# Patient Record
Sex: Male | Born: 1935 | Race: White | Hispanic: No | Marital: Married | Smoking: Former smoker
Health system: Southern US, Community
[De-identification: ages and names within clinical notes are randomized; demographics above are authoritative.]

## PROBLEM LIST (undated history)

## (undated) DIAGNOSIS — R7301 Impaired fasting glucose: Secondary | ICD-10-CM

## (undated) DIAGNOSIS — M1990 Unspecified osteoarthritis, unspecified site: Secondary | ICD-10-CM

## (undated) DIAGNOSIS — I4891 Unspecified atrial fibrillation: Secondary | ICD-10-CM

## (undated) DIAGNOSIS — R0602 Shortness of breath: Secondary | ICD-10-CM

## (undated) DIAGNOSIS — N200 Calculus of kidney: Secondary | ICD-10-CM

## (undated) DIAGNOSIS — I509 Heart failure, unspecified: Secondary | ICD-10-CM

## (undated) DIAGNOSIS — E785 Hyperlipidemia, unspecified: Secondary | ICD-10-CM

## (undated) DIAGNOSIS — K219 Gastro-esophageal reflux disease without esophagitis: Secondary | ICD-10-CM

## (undated) DIAGNOSIS — I219 Acute myocardial infarction, unspecified: Secondary | ICD-10-CM

## (undated) DIAGNOSIS — N400 Enlarged prostate without lower urinary tract symptoms: Secondary | ICD-10-CM

## (undated) DIAGNOSIS — J449 Chronic obstructive pulmonary disease, unspecified: Secondary | ICD-10-CM

## (undated) DIAGNOSIS — E039 Hypothyroidism, unspecified: Secondary | ICD-10-CM

## (undated) DIAGNOSIS — I10 Essential (primary) hypertension: Secondary | ICD-10-CM

## (undated) DIAGNOSIS — I2510 Atherosclerotic heart disease of native coronary artery without angina pectoris: Secondary | ICD-10-CM

## (undated) HISTORY — DX: Atherosclerotic heart disease of native coronary artery without angina pectoris: I25.10

## (undated) HISTORY — DX: Benign prostatic hyperplasia without lower urinary tract symptoms: N40.0

## (undated) HISTORY — DX: Unspecified atrial fibrillation: I48.91

## (undated) HISTORY — DX: Hyperlipidemia, unspecified: E78.5

## (undated) HISTORY — DX: Calculus of kidney: N20.0

## (undated) HISTORY — DX: Chronic obstructive pulmonary disease, unspecified: J44.9

## (undated) HISTORY — DX: Unspecified osteoarthritis, unspecified site: M19.90

## (undated) HISTORY — DX: Hypothyroidism, unspecified: E03.9

## (undated) HISTORY — DX: Impaired fasting glucose: R73.01

---

## 1978-12-23 HISTORY — PX: PLEURAL SCARIFICATION: SHX748

## 1989-08-23 HISTORY — PX: BACK SURGERY: SHX140

## 2000-12-23 HISTORY — PX: TRANSURETHRAL RESECTION OF PROSTATE: SHX73

## 2001-03-31 ENCOUNTER — Encounter: Payer: Self-pay | Admitting: Urology

## 2001-04-02 ENCOUNTER — Encounter (INDEPENDENT_AMBULATORY_CARE_PROVIDER_SITE_OTHER): Payer: Self-pay | Admitting: Specialist

## 2001-04-02 ENCOUNTER — Inpatient Hospital Stay (HOSPITAL_COMMUNITY): Admission: RE | Admit: 2001-04-02 | Discharge: 2001-04-04 | Payer: Self-pay | Admitting: Urology

## 2001-04-03 ENCOUNTER — Encounter: Payer: Self-pay | Admitting: Urology

## 2001-04-05 ENCOUNTER — Emergency Department (HOSPITAL_COMMUNITY): Admission: EM | Admit: 2001-04-05 | Discharge: 2001-04-05 | Payer: Self-pay | Admitting: Emergency Medicine

## 2001-08-12 ENCOUNTER — Encounter: Payer: Self-pay | Admitting: Internal Medicine

## 2001-08-12 ENCOUNTER — Ambulatory Visit (HOSPITAL_COMMUNITY): Admission: RE | Admit: 2001-08-12 | Discharge: 2001-08-12 | Payer: Self-pay | Admitting: Internal Medicine

## 2002-03-15 ENCOUNTER — Encounter: Payer: Self-pay | Admitting: Emergency Medicine

## 2002-03-15 ENCOUNTER — Inpatient Hospital Stay (HOSPITAL_COMMUNITY): Admission: EM | Admit: 2002-03-15 | Discharge: 2002-03-17 | Payer: Self-pay | Admitting: Emergency Medicine

## 2002-03-16 ENCOUNTER — Encounter: Payer: Self-pay | Admitting: Cardiology

## 2002-04-06 ENCOUNTER — Encounter (HOSPITAL_COMMUNITY): Admission: RE | Admit: 2002-04-06 | Discharge: 2002-07-05 | Payer: Self-pay | Admitting: Cardiology

## 2002-04-22 HISTORY — PX: ESOPHAGOGASTRODUODENOSCOPY: SHX1529

## 2002-04-23 ENCOUNTER — Ambulatory Visit (HOSPITAL_COMMUNITY): Admission: RE | Admit: 2002-04-23 | Discharge: 2002-04-23 | Payer: Self-pay | Admitting: Gastroenterology

## 2004-04-13 ENCOUNTER — Encounter: Payer: Self-pay | Admitting: Internal Medicine

## 2004-09-22 HISTORY — PX: INGUINAL HERNIA REPAIR: SHX194

## 2004-10-02 ENCOUNTER — Ambulatory Visit (HOSPITAL_COMMUNITY): Admission: RE | Admit: 2004-10-02 | Discharge: 2004-10-02 | Payer: Self-pay | Admitting: Surgery

## 2005-03-28 ENCOUNTER — Ambulatory Visit: Payer: Self-pay | Admitting: Internal Medicine

## 2005-07-01 ENCOUNTER — Ambulatory Visit: Payer: Self-pay | Admitting: Internal Medicine

## 2005-08-12 ENCOUNTER — Ambulatory Visit: Payer: Self-pay | Admitting: Internal Medicine

## 2005-10-09 ENCOUNTER — Ambulatory Visit: Payer: Self-pay | Admitting: Internal Medicine

## 2005-11-27 ENCOUNTER — Ambulatory Visit: Payer: Self-pay | Admitting: Internal Medicine

## 2006-05-29 ENCOUNTER — Ambulatory Visit: Payer: Self-pay | Admitting: Internal Medicine

## 2006-06-22 HISTORY — PX: BLADDER STONE REMOVAL: SHX568

## 2006-07-02 ENCOUNTER — Encounter: Admission: RE | Admit: 2006-07-02 | Discharge: 2006-07-02 | Payer: Self-pay | Admitting: Urology

## 2006-07-03 ENCOUNTER — Ambulatory Visit (HOSPITAL_BASED_OUTPATIENT_CLINIC_OR_DEPARTMENT_OTHER): Admission: RE | Admit: 2006-07-03 | Discharge: 2006-07-03 | Payer: Self-pay | Admitting: Urology

## 2006-09-30 ENCOUNTER — Ambulatory Visit: Payer: Self-pay | Admitting: Internal Medicine

## 2006-12-01 ENCOUNTER — Ambulatory Visit: Payer: Self-pay | Admitting: Internal Medicine

## 2007-06-01 DIAGNOSIS — N200 Calculus of kidney: Secondary | ICD-10-CM | POA: Insufficient documentation

## 2007-06-01 DIAGNOSIS — E785 Hyperlipidemia, unspecified: Secondary | ICD-10-CM

## 2007-06-01 DIAGNOSIS — M159 Polyosteoarthritis, unspecified: Secondary | ICD-10-CM | POA: Insufficient documentation

## 2007-06-01 DIAGNOSIS — J309 Allergic rhinitis, unspecified: Secondary | ICD-10-CM | POA: Insufficient documentation

## 2007-06-01 DIAGNOSIS — I2510 Atherosclerotic heart disease of native coronary artery without angina pectoris: Secondary | ICD-10-CM

## 2007-06-04 ENCOUNTER — Ambulatory Visit: Payer: Self-pay | Admitting: Internal Medicine

## 2007-06-04 DIAGNOSIS — G589 Mononeuropathy, unspecified: Secondary | ICD-10-CM | POA: Insufficient documentation

## 2007-06-09 LAB — CONVERTED CEMR LAB
Albumin: 3.8 g/dL (ref 3.5–5.2)
Bilirubin, Direct: 0.1 mg/dL (ref 0.0–0.3)
CO2: 28 meq/L (ref 19–32)
GFR calc Af Amer: 105 mL/min
Neutrophils Relative %: 69.8 % (ref 43.0–77.0)
Phosphorus: 2.8 mg/dL (ref 2.3–4.6)
Platelets: 189 10*3/uL (ref 150–400)
Potassium: 4.5 meq/L (ref 3.5–5.1)
RBC: 4.76 M/uL (ref 4.22–5.81)
RDW: 12.9 % (ref 11.5–14.6)
Sodium: 138 meq/L (ref 135–145)
Total Bilirubin: 0.8 mg/dL (ref 0.3–1.2)
WBC: 5.4 10*3/uL (ref 4.5–10.5)

## 2007-09-29 ENCOUNTER — Ambulatory Visit: Payer: Self-pay | Admitting: Internal Medicine

## 2007-10-21 ENCOUNTER — Encounter: Payer: Self-pay | Admitting: Internal Medicine

## 2007-10-26 ENCOUNTER — Telehealth (INDEPENDENT_AMBULATORY_CARE_PROVIDER_SITE_OTHER): Payer: Self-pay | Admitting: *Deleted

## 2007-12-02 ENCOUNTER — Ambulatory Visit: Payer: Self-pay | Admitting: Internal Medicine

## 2007-12-02 LAB — CONVERTED CEMR LAB
Albumin: 3.8 g/dL (ref 3.5–5.2)
Calcium: 10 mg/dL (ref 8.4–10.5)
Eosinophils Absolute: 0.2 10*3/uL (ref 0.0–0.6)
Eosinophils Relative: 2.5 % (ref 0.0–5.0)
Glucose, Bld: 100 mg/dL — ABNORMAL HIGH (ref 70–99)
Hemoglobin: 15.6 g/dL (ref 13.0–17.0)
Lymphocytes Relative: 13.2 % (ref 12.0–46.0)
MCHC: 34.9 g/dL (ref 30.0–36.0)
MCV: 94 fL (ref 78.0–100.0)
Monocytes Absolute: 0.7 10*3/uL (ref 0.2–0.7)
Monocytes Relative: 10.8 % (ref 3.0–11.0)
Potassium: 4.8 meq/L (ref 3.5–5.1)
RDW: 12.7 % (ref 11.5–14.6)
Sodium: 139 meq/L (ref 135–145)
TSH: 3.14 microintl units/mL (ref 0.35–5.50)
WBC: 6.9 10*3/uL (ref 4.5–10.5)

## 2008-01-04 ENCOUNTER — Telehealth (INDEPENDENT_AMBULATORY_CARE_PROVIDER_SITE_OTHER): Payer: Self-pay | Admitting: *Deleted

## 2008-03-21 ENCOUNTER — Telehealth (INDEPENDENT_AMBULATORY_CARE_PROVIDER_SITE_OTHER): Payer: Self-pay | Admitting: *Deleted

## 2008-04-12 ENCOUNTER — Encounter: Payer: Self-pay | Admitting: Internal Medicine

## 2008-04-20 ENCOUNTER — Ambulatory Visit: Payer: Self-pay | Admitting: Internal Medicine

## 2008-04-21 LAB — CONVERTED CEMR LAB
ALT: 16 units/L (ref 0–53)
AST: 24 units/L (ref 0–37)
Albumin: 3.8 g/dL (ref 3.5–5.2)
Alkaline Phosphatase: 68 units/L (ref 39–117)
BUN: 14 mg/dL (ref 6–23)
Basophils Absolute: 0 10*3/uL (ref 0.0–0.1)
Basophils Relative: 0.1 % (ref 0.0–1.0)
CO2: 27 meq/L (ref 19–32)
Calcium: 9.6 mg/dL (ref 8.4–10.5)
Chloride: 108 meq/L (ref 96–112)
Eosinophils Absolute: 0.2 10*3/uL (ref 0.0–0.7)
Eosinophils Relative: 3.2 % (ref 0.0–5.0)
GFR calc Af Amer: 93 mL/min
GFR calc non Af Amer: 77 mL/min
MCV: 93.9 fL (ref 78.0–100.0)
Monocytes Relative: 8.8 % (ref 3.0–12.0)
Neutro Abs: 3.9 10*3/uL (ref 1.4–7.7)
Phosphorus: 2.5 mg/dL (ref 2.3–4.6)
Platelets: 166 10*3/uL (ref 150–400)
TSH: 2.99 microintl units/mL (ref 0.35–5.50)
Total CHOL/HDL Ratio: 4.5
Triglycerides: 99 mg/dL (ref 0–149)

## 2008-05-02 ENCOUNTER — Encounter: Admission: RE | Admit: 2008-05-02 | Discharge: 2008-05-02 | Payer: Self-pay | Admitting: Cardiology

## 2008-05-05 ENCOUNTER — Inpatient Hospital Stay (HOSPITAL_BASED_OUTPATIENT_CLINIC_OR_DEPARTMENT_OTHER): Admission: RE | Admit: 2008-05-05 | Discharge: 2008-05-05 | Payer: Self-pay | Admitting: Cardiology

## 2008-05-09 ENCOUNTER — Telehealth (INDEPENDENT_AMBULATORY_CARE_PROVIDER_SITE_OTHER): Payer: Self-pay | Admitting: *Deleted

## 2008-05-10 ENCOUNTER — Encounter: Payer: Self-pay | Admitting: Internal Medicine

## 2008-07-27 ENCOUNTER — Telehealth (INDEPENDENT_AMBULATORY_CARE_PROVIDER_SITE_OTHER): Payer: Self-pay | Admitting: *Deleted

## 2008-08-23 ENCOUNTER — Ambulatory Visit: Payer: Self-pay | Admitting: Internal Medicine

## 2008-09-29 ENCOUNTER — Encounter: Payer: Self-pay | Admitting: Internal Medicine

## 2008-10-19 ENCOUNTER — Encounter: Payer: Self-pay | Admitting: Internal Medicine

## 2008-11-15 ENCOUNTER — Ambulatory Visit: Payer: Self-pay | Admitting: Family Medicine

## 2008-11-16 ENCOUNTER — Inpatient Hospital Stay (HOSPITAL_COMMUNITY): Admission: EM | Admit: 2008-11-16 | Discharge: 2008-11-18 | Payer: Self-pay | Admitting: Emergency Medicine

## 2008-11-16 ENCOUNTER — Ambulatory Visit: Payer: Self-pay | Admitting: Internal Medicine

## 2008-11-18 ENCOUNTER — Encounter: Payer: Self-pay | Admitting: Internal Medicine

## 2008-11-21 ENCOUNTER — Telehealth (INDEPENDENT_AMBULATORY_CARE_PROVIDER_SITE_OTHER): Payer: Self-pay | Admitting: *Deleted

## 2008-11-23 ENCOUNTER — Encounter: Payer: Self-pay | Admitting: Internal Medicine

## 2008-11-24 ENCOUNTER — Ambulatory Visit: Payer: Self-pay | Admitting: Internal Medicine

## 2008-11-24 DIAGNOSIS — J449 Chronic obstructive pulmonary disease, unspecified: Secondary | ICD-10-CM

## 2008-11-24 DIAGNOSIS — J4489 Other specified chronic obstructive pulmonary disease: Secondary | ICD-10-CM | POA: Insufficient documentation

## 2008-11-24 DIAGNOSIS — I4891 Unspecified atrial fibrillation: Secondary | ICD-10-CM | POA: Insufficient documentation

## 2008-12-22 ENCOUNTER — Encounter: Payer: Self-pay | Admitting: Internal Medicine

## 2009-01-04 ENCOUNTER — Ambulatory Visit: Payer: Self-pay | Admitting: Internal Medicine

## 2009-01-05 LAB — CONVERTED CEMR LAB
Eosinophils Relative: 5.2 % — ABNORMAL HIGH (ref 0.0–5.0)
GFR calc Af Amer: 105 mL/min
HCT: 45.2 % (ref 39.0–52.0)
Lymphocytes Relative: 17.1 % (ref 12.0–46.0)
MCV: 93.6 fL (ref 78.0–100.0)
Monocytes Absolute: 0.6 10*3/uL (ref 0.1–1.0)
Neutrophils Relative %: 67.6 % (ref 43.0–77.0)
RDW: 13 % (ref 11.5–14.6)
WBC: 5.7 10*3/uL (ref 4.5–10.5)

## 2009-01-18 ENCOUNTER — Encounter: Payer: Self-pay | Admitting: Internal Medicine

## 2009-02-08 ENCOUNTER — Encounter: Payer: Self-pay | Admitting: Internal Medicine

## 2009-02-22 ENCOUNTER — Encounter: Payer: Self-pay | Admitting: Internal Medicine

## 2009-03-13 ENCOUNTER — Telehealth: Payer: Self-pay | Admitting: Internal Medicine

## 2009-05-01 ENCOUNTER — Telehealth: Payer: Self-pay | Admitting: Internal Medicine

## 2009-05-31 ENCOUNTER — Encounter: Payer: Self-pay | Admitting: Internal Medicine

## 2009-07-12 ENCOUNTER — Telehealth: Payer: Self-pay | Admitting: Internal Medicine

## 2009-07-19 ENCOUNTER — Ambulatory Visit: Payer: Self-pay | Admitting: Internal Medicine

## 2009-07-20 LAB — CONVERTED CEMR LAB
ALT: 17 units/L (ref 0–53)
Alkaline Phosphatase: 82 units/L (ref 39–117)
Basophils Absolute: 0.1 10*3/uL (ref 0.0–0.1)
Bilirubin, Direct: 0.1 mg/dL (ref 0.0–0.3)
Calcium: 9.6 mg/dL (ref 8.4–10.5)
Chloride: 110 meq/L (ref 96–112)
Eosinophils Relative: 3.4 % (ref 0.0–5.0)
Glucose, Bld: 93 mg/dL (ref 70–99)
HCT: 44.5 % (ref 39.0–52.0)
Lymphocytes Relative: 16.3 % (ref 12.0–46.0)
Lymphs Abs: 0.9 10*3/uL (ref 0.7–4.0)
Monocytes Absolute: 0.5 10*3/uL (ref 0.1–1.0)
Monocytes Relative: 8.4 % (ref 3.0–12.0)
Neutro Abs: 3.9 10*3/uL (ref 1.4–7.7)
Neutrophils Relative %: 71 % (ref 43.0–77.0)
Phosphorus: 2.8 mg/dL (ref 2.3–4.6)
Total Bilirubin: 0.8 mg/dL (ref 0.3–1.2)

## 2009-09-12 ENCOUNTER — Telehealth: Payer: Self-pay | Admitting: Internal Medicine

## 2009-09-28 ENCOUNTER — Encounter: Payer: Self-pay | Admitting: Internal Medicine

## 2009-11-20 ENCOUNTER — Telehealth: Payer: Self-pay | Admitting: Internal Medicine

## 2010-01-23 ENCOUNTER — Ambulatory Visit: Payer: Self-pay | Admitting: Internal Medicine

## 2010-01-23 DIAGNOSIS — N400 Enlarged prostate without lower urinary tract symptoms: Secondary | ICD-10-CM | POA: Insufficient documentation

## 2010-02-15 ENCOUNTER — Telehealth: Payer: Self-pay | Admitting: Internal Medicine

## 2010-04-30 ENCOUNTER — Telehealth: Payer: Self-pay | Admitting: Internal Medicine

## 2010-06-29 ENCOUNTER — Ambulatory Visit: Payer: Self-pay | Admitting: Internal Medicine

## 2010-06-29 DIAGNOSIS — M79609 Pain in unspecified limb: Secondary | ICD-10-CM

## 2010-07-02 LAB — CONVERTED CEMR LAB
ALT: 15 units/L (ref 0–53)
Alkaline Phosphatase: 70 units/L (ref 39–117)
Bilirubin, Direct: 0.2 mg/dL (ref 0.0–0.3)
CO2: 30 meq/L (ref 19–32)
Creatinine, Ser: 1 mg/dL (ref 0.4–1.5)
Eosinophils Absolute: 0.2 10*3/uL (ref 0.0–0.7)
Eosinophils Relative: 2.3 % (ref 0.0–5.0)
GFR calc non Af Amer: 73 mL/min (ref >60)
Glucose, Bld: 100 mg/dL — ABNORMAL HIGH (ref 70–99)
HCT: 44.4 % (ref 39.0–52.0)
MCHC: 33.3 g/dL (ref 30.0–36.0)
MCV: 95.9 fL (ref 78.0–100.0)
Monocytes Absolute: 0.5 10*3/uL (ref 0.1–1.0)
Monocytes Relative: 7.9 % (ref 3.0–12.0)
Neutro Abs: 5.1 10*3/uL (ref 1.4–7.7)
Neutrophils Relative %: 74.5 % (ref 43.0–77.0)
Sodium: 142 meq/L (ref 135–145)
TSH: 6.37 microintl units/mL — ABNORMAL HIGH (ref 0.35–5.50)
Total Protein: 7.1 g/dL (ref 6.0–8.3)
WBC: 6.8 10*3/uL (ref 4.5–10.5)

## 2010-07-03 ENCOUNTER — Ambulatory Visit: Payer: Self-pay

## 2010-07-03 ENCOUNTER — Encounter: Payer: Self-pay | Admitting: Cardiovascular Disease

## 2010-07-13 ENCOUNTER — Encounter: Payer: Self-pay | Admitting: Internal Medicine

## 2010-07-25 ENCOUNTER — Encounter: Payer: Self-pay | Admitting: Internal Medicine

## 2010-08-14 ENCOUNTER — Ambulatory Visit: Payer: Self-pay | Admitting: Internal Medicine

## 2010-08-23 ENCOUNTER — Encounter: Admission: RE | Admit: 2010-08-23 | Discharge: 2010-08-23 | Payer: Self-pay | Admitting: Neurosurgery

## 2010-09-04 ENCOUNTER — Telehealth: Payer: Self-pay | Admitting: Internal Medicine

## 2010-12-05 ENCOUNTER — Telehealth: Payer: Self-pay | Admitting: Internal Medicine

## 2011-01-04 ENCOUNTER — Ambulatory Visit
Admission: RE | Admit: 2011-01-04 | Discharge: 2011-01-04 | Payer: Self-pay | Source: Home / Self Care | Attending: Internal Medicine | Admitting: Internal Medicine

## 2011-01-04 ENCOUNTER — Other Ambulatory Visit: Payer: Self-pay | Admitting: Internal Medicine

## 2011-01-04 DIAGNOSIS — E039 Hypothyroidism, unspecified: Secondary | ICD-10-CM | POA: Insufficient documentation

## 2011-01-04 LAB — TSH: TSH: 16.09 u[IU]/mL — ABNORMAL HIGH (ref 0.35–5.50)

## 2011-01-04 LAB — T4, FREE: Free T4: 0.74 ng/dL (ref 0.60–1.60)

## 2011-01-22 NOTE — Letter (Signed)
Summary: Cardiology-Dr. Viann Fish  Cardiology-Dr. Viann Fish   Imported By: Maryln Gottron 07/20/2010 11:19:43  _____________________________________________________________________  External Attachment:    Type:   Image     Comment:   External Document  Appended Document: Cardiology-Dr. Viann Fish stable  no changes 1 year follow up

## 2011-01-22 NOTE — Assessment & Plan Note (Signed)
Summary: 8 M F/U DLO   Vital Signs:  Patient profile:   77 year old male Height:      65 inches Weight:      127 pounds BMI:     21.21 Temp:     97.9 degrees F oral Pulse rate:   80 / minute Pulse rhythm:   regular BP sitting:   122 / 80  (left arm) Cuff size:   regular  Vitals Entered By: Linde Gillis CMA Duncan Dull) (June 29, 2010 7:51 AM) CC: 6 month follow up   History of Present Illness: Having awful left leg and hip pain Okay first thing in AM--progressive worsening as day goes on Okay at rest--bad with any activity Pain goes all the way down to foot and may be worst below knee Hydrocodone helps but doesn't take away pain completely pain relief doesn't really help his activity tolerance  No heart pain no recent angina or need for NTG no palpitations No SOB  No trouble voiding rare nocturia before 5AM feels the amitryiptylline really helps night symptoms  Allergies: 1)  * Vioxx (Rofecoxib) 2)  Penicillin G Potassium (Penicillin G Potassium) 3)  Lipitor (Atorvastatin Calcium) 4)  Vytorin (Ezetimibe-Simvastatin) 5)  * Tricor  Past History:  Past medical, surgical, family and social histories (including risk factors) reviewed for relevance to current acute and chronic problems.  Past Medical History: Reviewed history from 11/24/2008 and no changes required. Allergic rhinitis Coronary artery disease------------------------Dr Donnie Aho [phone] Hyperlipidemia Kidney stones BPH------------------------------------------------Dr Annabell Howells   [phone] Osteoarthritis Atrial fibrillation COPD  Past Surgical History: Reviewed history from 11/24/2008 and no changes required. Inguinal herniorrhaphy, left ([facility])  10/05 MI 03/03 Back surgery x 3 1990's TURP 2002 Pneumothorax right 1980's EGD/Colon negative Laural Benes) 05/03 Stress test 05/03 Cardiolite stress, negative EF 72% 04/05 Ureteroscopy/stone removal Annabell Howells) 07/07 Cardiolite negative 04/08 Arial fibrillation  with COPD exac--11/09  Family History: Reviewed history from 06/01/2007 and no changes required. Dad died @89  of old age Mom died @74  MI 3 brothers died of MI/CVA 3 other brothers living 1 sister died/2 living CAD throughout sibs No HTN or DM No cancer  Social History: Reviewed history from 06/04/2007 and no changes required. Retired--[employer] Married--1 child Former Smoker--quit with MI 3/03 Alcohol use-no Does lots of yard work and a big garden  Review of Systems       appetite is fine weight is down 7#---he thinks this is normal for him in the summer sleeps well--awakens refreshed  Physical Exam  General:  alert and normal appearance.   Neck:  supple, no masses, no thyromegaly, no carotid bruits, and no cervical lymphadenopathy.   Lungs:  normal respiratory effort and normal breath sounds.   Heart:  normal rate, regular rhythm, no murmur, and no gallop.   Abdomen:  soft, non-tender, and no masses.   Msk:  no joint tenderness and no joint swelling.   Only mild decreased internal rotation of left hip Left knee okay Pulses:  faint PT in both feet but no DP pulses Extremities:  no edema Skin:  no suspicious lesions and no ulcerations.   Psych:  normally interactive, good eye contact, not anxious appearing, and not depressed appearing.     Impression & Recommendations:  Problem # 1:  LEG PAIN, LEFT (ICD-729.5) Assessment New  highly suspicious for claudication less likely spinal stenosis (as unilateral) or arthritis in hip/knee will start with ABI and make vascular referral if abnormal ortho if not vascular cause but suspect no surgery appropriate if  spinal stenosis  Orders: LE Arterial Doppler/ABI (Le arterial doppler)  Problem # 2:  OSTEOARTHRITIS (ICD-715.90) Assessment: Unchanged generally does well with the pain meds  His updated medication list for this problem includes:    Adult Aspirin Low Strength 81 Mg Tbdp (Aspirin) .Marland Kitchen... Take one by mouth  daily    Vicodin 5-500 Mg Tabs (Hydrocodone-acetaminophen) .Marland Kitchen... 1 three times a day as needed for pain  Problem # 3:  ATRIAL FIBRILLATION (ICD-427.31) Assessment: Unchanged  still in sinus by exam no changes needed  His updated medication list for this problem includes:    Toprol Xl 50 Mg Tb24 (Metoprolol succinate) .Marland Kitchen... Take 1/2 tab by mouth daily    Adult Aspirin Low Strength 81 Mg Tbdp (Aspirin) .Marland Kitchen... Take one by mouth daily  Orders: TLB-Renal Function Panel (80069-RENAL) TLB-CBC Platelet - w/Differential (85025-CBCD) TLB-Hepatic/Liver Function Pnl (80076-HEPATIC) TLB-TSH (Thyroid Stimulating Hormone) (84443-TSH) Venipuncture (24401)  Problem # 4:  CORONARY ARTERY DISEASE (ICD-414.00) Assessment: Unchanged no angina doing fine on regimen  His updated medication list for this problem includes:    Altace 5 Mg Caps (Ramipril) .Marland Kitchen... Take one by mouth daily    Toprol Xl 50 Mg Tb24 (Metoprolol succinate) .Marland Kitchen... Take 1/2 tab by mouth daily    Adult Aspirin Low Strength 81 Mg Tbdp (Aspirin) .Marland Kitchen... Take one by mouth daily    Nitrostat 0.4 Mg Subl (Nitroglycerin) .Marland Kitchen... Place 1 tab under tongue every 5 minutes til chestpain is gone.Marland Kitchenupto 3     doses..no relief.get help  Problem # 5:  HYPERLIPIDEMIA (ICD-272.4) Assessment: Comment Only intolerant of statins  Labs Reviewed: SGOT: 26 (07/19/2009)   SGPT: 17 (07/19/2009)   HDL:44.5 (04/20/2008)  LDL:136 (04/20/2008)  Chol:200 (04/20/2008)  Trig:99 (04/20/2008)  Problem # 6:  HYPERTROPHY PROSTATE W/O UR OBST & OTH LUTS (ICD-600.00) Assessment: Comment Only doing well on rapaflo and nighttime amitriptylline  Complete Medication List: 1)  Altace 5 Mg Caps (Ramipril) .... Take one by mouth daily 2)  Toprol Xl 50 Mg Tb24 (Metoprolol succinate) .... Take 1/2 tab by mouth daily 3)  Adult Aspirin Low Strength 81 Mg Tbdp (Aspirin) .... Take one by mouth daily 4)  Vicodin 5-500 Mg Tabs (Hydrocodone-acetaminophen) .Marland Kitchen.. 1 three times a day as  needed for pain 5)  Nitrostat 0.4 Mg Subl (Nitroglycerin) .... Place 1 tab under tongue every 5 minutes til chestpain is gone.Marland Kitchenupto 3     doses..no relief.get help 6)  Amitriptyline Hcl 25 Mg Tabs (Amitriptyline hcl) .Marland Kitchen.. 1 at bedtime 7)  Rapaflo 4 Mg Caps (Silodosin) .... Pt unsure of dosage  Patient Instructions: 1)  Please schedule a follow-up appointment in 6 months .  2)  Please set up leg circulation test Prescriptions: VICODIN 5-500 MG TABS (HYDROCODONE-ACETAMINOPHEN) 1 three times a day as needed for pain  #90 x 1   Entered and Authorized by:   Cindee Salt MD   Signed by:   Cindee Salt MD on 06/29/2010   Method used:   Print then Give to Patient   RxID:   [id number]   Current Allergies (reviewed today): * VIOXX (ROFECOXIB) PENICILLIN G POTASSIUM (PENICILLIN G POTASSIUM) LIPITOR (ATORVASTATIN CALCIUM) VYTORIN (EZETIMIBE-SIMVASTATIN) * TRICOR

## 2011-01-22 NOTE — Progress Notes (Signed)
Summary: refill request for vicodin  Phone Note Refill Request Message from:  Fax from Pharmacy  Refills Requested: Medication #1:  VICODIN 5-500 MG TABS 1 three times a day as needed for pain   Last Refilled: 01/04/2010 Faxed request from [address] [facility] road is on your desk.  Initial call taken by: Lowella Petties CMA,  February 15, 2010 9:03 AM  Follow-up for Phone Call        okay #60 x 1 Follow-up by: Cindee Salt MD,  February 15, 2010 1:19 PM  Additional Follow-up for Phone Call Additional follow up Details #1::        Rx faxed to pharmacy Additional Follow-up by: DeShannon Smith CMA Duncan Dull),  February 15, 2010 3:58 PM    Prescriptions: VICODIN 5-500 MG TABS (HYDROCODONE-ACETAMINOPHEN) 1 three times a day as needed for pain  #60 x 1   Entered by:   Mervin Hack CMA (AAMA)   Authorized by:   Cindee Salt MD   Signed by:   Mervin Hack CMA (AAMA) on 02/15/2010   Method used:   Handwritten   RxID:   [id number]

## 2011-01-22 NOTE — Assessment & Plan Note (Signed)
Summary: LEFT HIP,LEG PAIN/CLE   Vital Signs:  Patient profile:   77 year old male Weight:      125 pounds Temp:     98.3 degrees F oral BP sitting:   110 / 60  (left arm) Cuff size:   large  Vitals Entered By: Mervin Hack CMA Duncan Dull) (August 14, 2010 12:16 PM) CC: left hip/leg pain   History of Present Illness: Having worsened pain in left leg and hip-----worst between knee and foot pain comes on with any effort to walk Keeps him up at night due to pain Hydrocodone helps only a little  No edema  Allergies: 1)  * Vioxx (Rofecoxib) 2)  Penicillin G Potassium (Penicillin G Potassium) 3)  Lipitor (Atorvastatin Calcium) 4)  Vytorin (Ezetimibe-Simvastatin) 5)  * Tricor  Past History:  Past medical, surgical, family and social histories (including risk factors) reviewed for relevance to current acute and chronic problems.  Past Medical History: Reviewed history from 11/24/2008 and no changes required. Allergic rhinitis Coronary artery disease------------------------Dr Donnie Aho [phone] Hyperlipidemia Kidney stones BPH------------------------------------------------Dr Annabell Howells   [phone] Osteoarthritis Atrial fibrillation COPD  Past Surgical History: Reviewed history from 11/24/2008 and no changes required. Inguinal herniorrhaphy, left ([facility])  10/05 MI 03/03 Back surgery x 3 1990's TURP 2002 Pneumothorax right 1980's EGD/Colon negative Laural Benes) 05/03 Stress test 05/03 Cardiolite stress, negative EF 72% 04/05 Ureteroscopy/stone removal Annabell Howells) 07/07 Cardiolite negative 04/08 Arial fibrillation with COPD exac--11/09  Family History: Reviewed history from 06/01/2007 and no changes required. Dad died @89  of old age Mom died @74  MI 3 brothers died of MI/CVA 3 other brothers living 1 sister died/2 living CAD throughout sibs No HTN or DM No cancer  Social History: Reviewed history from 06/04/2007 and no changes required. Retired--[employer] Married--1  child Former Smoker--quit with MI 3/03 Alcohol use-no Does lots of yard work and a big garden  Review of Systems  The patient denies chest pain, syncope, and dyspnea on exertion.    Physical Exam  General:  alert.  NAD Msk:  No left hip or knee swelling some decrease in internal and external rotation of left hip--more than right Knee is stable Neurologic:  antalgic gait Mild weakness in left leg---esp at hip and knees reflexes fairly symmetric   Impression & Recommendations:  Problem # 1:  LEG PAIN, LEFT (ICD-729.5) Assessment Deteriorated  not vascular not sure if this is related to hip arthritis or related to spinal problem Rest pain not consistent with spinal stenosis multiple back surgeries  needs reeval at neurosurgeon first  Orders: Neurosurgeon Referral (Neurosurgeon)  Complete Medication List: 1)  Altace 5 Mg Caps (Ramipril) .... Take one by mouth daily 2)  Toprol Xl 50 Mg Tb24 (Metoprolol succinate) .... Take 1/2 tab by mouth daily 3)  Adult Aspirin Low Strength 81 Mg Tbdp (Aspirin) .... Take one by mouth daily 4)  Vicodin 5-500 Mg Tabs (Hydrocodone-acetaminophen) .Marland Kitchen.. 1 three times a day as needed for pain 5)  Nitrostat 0.4 Mg Subl (Nitroglycerin) .... Place 1 tab under tongue every 5 minutes til chestpain is gone.Marland Kitchenupto 3     doses..no relief.get help 6)  Amitriptyline Hcl 25 Mg Tabs (Amitriptyline hcl) .Marland Kitchen.. 1 at bedtime 7)  Rapaflo 8 Mg Caps (Silodosin) .... Take 1 by mouth once daily  Patient Instructions: 1)  Please keep your regular appt here 2)  Referral Appointment Information 3)  Day/Date: 4)  Time: 5)  Place/MD: 6)  Address: 7)  Phone/Fax: 8)  Patient given appointment information. Information/Orders faxed/mailed.  Current  Allergies (reviewed today): * VIOXX (ROFECOXIB) PENICILLIN G POTASSIUM (PENICILLIN G POTASSIUM) LIPITOR (ATORVASTATIN CALCIUM) VYTORIN (EZETIMIBE-SIMVASTATIN) * TRICOR

## 2011-01-22 NOTE — Progress Notes (Signed)
Summary: Rx Hydrocodone  Phone Note Refill Request Call back at [phone] Message from:  [facility] on Apr 30, 2010 10:16 AM  Refills Requested: Medication #1:  VICODIN 5-500 MG TABS 1 three times a day as needed for pain   Last Refilled: 03/26/2010 Received faxed refill request, form in your IN box.     Method Requested: Fax to Local Pharmacy Initial call taken by: Linde Gillis CMA Duncan Dull),  Apr 30, 2010 10:16 AM  Follow-up for Phone Call        okay #60 x 1 Follow-up by: Cindee Salt MD,  Apr 30, 2010 1:47 PM  Additional Follow-up for Phone Call Additional follow up Details #1::        Rx faxed to pharmacy Additional Follow-up by: DeShannon Smith CMA Duncan Dull),  Apr 30, 2010 2:15 PM    Prescriptions: VICODIN 5-500 MG TABS (HYDROCODONE-ACETAMINOPHEN) 1 three times a day as needed for pain  #60 x 1   Entered by:   Mervin Hack CMA (AAMA)   Authorized by:   Cindee Salt MD   Signed by:   Mervin Hack CMA (AAMA) on 04/30/2010   Method used:   Handwritten   RxID:   [id number]

## 2011-01-22 NOTE — Progress Notes (Signed)
Summary: refill request for vicodin  Phone Note Refill Request Message from:  Fax from Pharmacy  Refills Requested: Medication #1:  VICODIN 5-500 MG TABS 1 three times a day as needed for pain   Last Refilled: 07/31/2010 Faxed request from [address] is on  your desk.  Initial call taken by: Lowella Petties CMA,  September 04, 2010 11:35 AM  Follow-up for Phone Call        okay #90 x 1 Follow-up by: Cindee Salt MD,  September 04, 2010 1:24 PM  Additional Follow-up for Phone Call Additional follow up Details #1::        Rx faxed to pharmacy Additional Follow-up by: DeShannon Smith CMA Duncan Dull),  September 04, 2010 1:42 PM    Prescriptions: VICODIN 5-500 MG TABS (HYDROCODONE-ACETAMINOPHEN) 1 three times a day as needed for pain  #90 x 1   Entered by:   Mervin Hack CMA (AAMA)   Authorized by:   Cindee Salt MD   Signed by:   Mervin Hack CMA (AAMA) on 09/04/2010   Method used:   Handwritten   RxID:   [id number]

## 2011-01-22 NOTE — Miscellaneous (Signed)
Summary: Orders Update  Clinical Lists Changes  Orders: Added new Test order of Arterial Duplex Lower Extremity (Arterial Duplex Low) - Signed 

## 2011-01-22 NOTE — Assessment & Plan Note (Signed)
Summary: FOLLOW UP / LFW   Vital Signs:  Patient profile:   77 year old male Weight:      134 pounds Temp:     98 degrees F oral Pulse rate:   68 / minute Pulse rhythm:   regular BP sitting:   138 / 60  (left arm) Cuff size:   regular  Vitals Entered By: Mervin Hack CMA (AAMA) (January 23, 2010 8:08 AM) CC: 6 month follow-up   History of Present Illness: Having increased arthritic pain with the cold weather Bad in back and hips lately Often uses 1 in Am, often needs another later in day never takes >2 per day Limited outdoor activity with the cold (trying to do some trimming)  No heart problems No recent visit with Dr Donnie Aho He just wanted him to continue the toprol and ASA for atrial fib (had event monitor) One angina episode in past 6 months--resolved with 2 NTG No SOB--feels stamina is stable No edema No orthopnea or PND  Voids okay some daytime frequency but generally okay with rapaflo  Tingling in feet better legs feel "quivery" at times----very brief  Allergies: 1)  * Vioxx (Rofecoxib) 2)  Penicillin G Potassium (Penicillin G Potassium) 3)  Lipitor (Atorvastatin Calcium) 4)  Vytorin (Ezetimibe-Simvastatin) 5)  * Tricor  Past History:  Past medical, surgical, family and social histories (including risk factors) reviewed for relevance to current acute and chronic problems.  Past Medical History: Reviewed history from 11/24/2008 and no changes required. Allergic rhinitis Coronary artery disease------------------------Dr Donnie Aho [phone] Hyperlipidemia Kidney stones BPH------------------------------------------------Dr Annabell Howells   [phone] Osteoarthritis Atrial fibrillation COPD  Past Surgical History: Reviewed history from 11/24/2008 and no changes required. Inguinal herniorrhaphy, left ([facility])  10/05 MI 03/03 Back surgery x 3 1990's TURP 2002 Pneumothorax right 1980's EGD/Colon negative Laural Benes) 05/03 Stress test 05/03 Cardiolite  stress, negative EF 72% 04/05 Ureteroscopy/stone removal Annabell Howells) 07/07 Cardiolite negative 04/08 Arial fibrillation with COPD exac--11/09  Family History: Reviewed history from 06/01/2007 and no changes required. Dad died @89  of old age Mom died @74  MI 3 brothers died of MI/CVA 3 other brothers living 1 sister died/2 living CAD throughout sibs No HTN or DM No cancer  Social History: Reviewed history from 06/04/2007 and no changes required. Retired--[employer] Married--1 child Former Smoker--quit with MI 3/03 Alcohol use-no Does lots of yard work and a big garden  Review of Systems       sleeps well with amitriptylline Nocturia 1-2 per night appetite is good Weight up 10#--he didn't realize it was that much  Physical Exam  General:  alert and normal appearance.   Neck:  supple, no masses, no thyromegaly, no carotid bruits, and no cervical lymphadenopathy.   Lungs:  normal respiratory effort and normal breath sounds.   Heart:  normal rate, regular rhythm, no murmur, and no gallop.   Abdomen:  soft, non-tender, and no masses.   Extremities:  no edema Neurologic:  alert & oriented X3, strength normal in all extremities, and gait normal.   Psych:  normally interactive, good eye contact, not anxious appearing, and not depressed appearing.     Impression & Recommendations:  Problem # 1:  CORONARY ARTERY DISEASE (ICD-414.00) Assessment Unchanged rare angina stable status no CHF  His updated medication list for this problem includes:    Altace 5 Mg Caps (Ramipril) .Marland Kitchen... Take one by mouth daily    Toprol Xl 50 Mg Tb24 (Metoprolol succinate) .Marland Kitchen... Take 1/2 tab by mouth daily  Adult Aspirin Low Strength 81 Mg Tbdp (Aspirin) .Marland Kitchen... Take one by mouth daily    Nitrostat 0.4 Mg Subl (Nitroglycerin) .Marland Kitchen... Place 1 tab under tongue every 5 minutes til chestpain is gone.Marland Kitchenupto 3     doses..no relief.get help  Problem # 2:  OSTEOARTHRITIS (ICD-715.90) Assessment:  Unchanged ongoing pain worse in cold weather continue current meds  His updated medication list for this problem includes:    Adult Aspirin Low Strength 81 Mg Tbdp (Aspirin) .Marland Kitchen... Take one by mouth daily    Vicodin 5-500 Mg Tabs (Hydrocodone-acetaminophen) .Marland Kitchen... 1 three times a day as needed for pain  Problem # 3:  ATRIAL FIBRILLATION (ICD-427.31) Assessment: Unchanged still regular on exam presumably event monitor benign since Dr Donnie Aho decided coumadin not needed  His updated medication list for this problem includes:    Toprol Xl 50 Mg Tb24 (Metoprolol succinate) .Marland Kitchen... Take 1/2 tab by mouth daily    Adult Aspirin Low Strength 81 Mg Tbdp (Aspirin) .Marland Kitchen... Take one by mouth daily  Problem # 4:  HYPERTROPHY PROSTATE W/O UR OBST & OTH LUTS (ICD-600.00) Assessment: Comment Only doing fine on rapaflo  Problem # 5:  HYPERLIPIDEMIA (ICD-272.4) Assessment: Comment Only can't tolerate statins  Labs Reviewed: SGOT: 26 (07/19/2009)   SGPT: 17 (07/19/2009)   HDL:44.5 (04/20/2008)  LDL:136 (04/20/2008)  Chol:200 (04/20/2008)  Trig:99 (04/20/2008)  Complete Medication List: 1)  Altace 5 Mg Caps (Ramipril) .... Take one by mouth daily 2)  Toprol Xl 50 Mg Tb24 (Metoprolol succinate) .... Take 1/2 tab by mouth daily 3)  Adult Aspirin Low Strength 81 Mg Tbdp (Aspirin) .... Take one by mouth daily 4)  Vicodin 5-500 Mg Tabs (Hydrocodone-acetaminophen) .Marland Kitchen.. 1 three times a day as needed for pain 5)  Nitrostat 0.4 Mg Subl (Nitroglycerin) .... Place 1 tab under tongue every 5 minutes til chestpain is gone.Marland Kitchenupto 3     doses..no relief.get help 6)  Amitriptyline Hcl 25 Mg Tabs (Amitriptyline hcl) .Marland Kitchen.. 1 at bedtime 7)  Rapaflo 4 Mg Caps (Silodosin) .... Pt unsure of dosage  Patient Instructions: 1)  Please schedule a follow-up appointment in 6 months .   Current Allergies (reviewed today): * VIOXX (ROFECOXIB) PENICILLIN G POTASSIUM (PENICILLIN G POTASSIUM) LIPITOR (ATORVASTATIN CALCIUM) VYTORIN  (EZETIMIBE-SIMVASTATIN) * TRICOR

## 2011-01-22 NOTE — Letter (Signed)
Summary: [facility]  [facility]   Imported By: Maryln Gottron 08/03/2010 13:25:36  _____________________________________________________________________  External Attachment:    Type:   Image     Comment:   External Document  Appended Document: [facility] BPH on rapaflo 1 year follow up

## 2011-01-23 ENCOUNTER — Encounter: Payer: Self-pay | Admitting: Internal Medicine

## 2011-01-23 ENCOUNTER — Ambulatory Visit (INDEPENDENT_AMBULATORY_CARE_PROVIDER_SITE_OTHER): Payer: Medicare Other | Admitting: Internal Medicine

## 2011-01-23 ENCOUNTER — Other Ambulatory Visit: Payer: Self-pay | Admitting: Internal Medicine

## 2011-01-23 ENCOUNTER — Ambulatory Visit (INDEPENDENT_AMBULATORY_CARE_PROVIDER_SITE_OTHER)
Admission: RE | Admit: 2011-01-23 | Discharge: 2011-01-23 | Disposition: A | Discharge status: Home / Self Care | Payer: Medicare Other | Source: Ambulatory Visit | Attending: Internal Medicine | Admitting: Internal Medicine

## 2011-01-23 DIAGNOSIS — R0989 Other specified symptoms and signs involving the circulatory and respiratory systems: Secondary | ICD-10-CM

## 2011-01-23 DIAGNOSIS — J209 Acute bronchitis, unspecified: Secondary | ICD-10-CM

## 2011-01-23 DIAGNOSIS — R0609 Other forms of dyspnea: Secondary | ICD-10-CM

## 2011-01-24 NOTE — Progress Notes (Signed)
Summary: vicodin   Phone Note Refill Request Message from:  Fax from Pharmacy on December 05, 2010 9:35 AM  Refills Requested: Medication #1:  VICODIN 5-500 MG TABS 1 three times a day as needed for pain   Last Refilled: 10/16/2010 Refill request from [address] [phone]. Fax is on your desk.     Initial call taken by: Melody Comas,  December 05, 2010 9:36 AM  Follow-up for Phone Call        okay #90 x 1 Follow-up by: Cindee Salt MD,  December 05, 2010 2:00 PM  Additional Follow-up for Phone Call Additional follow up Details #1::        Rx faxed to pharmacy Additional Follow-up by: DeShannon Smith CMA Duncan Dull),  December 05, 2010 2:58 PM    Prescriptions: VICODIN 5-500 MG TABS (HYDROCODONE-ACETAMINOPHEN) 1 three times a day as needed for pain  #90 x 1   Entered by:   Mervin Hack CMA (AAMA)   Authorized by:   Cindee Salt MD   Signed by:   Mervin Hack CMA (AAMA) on 12/05/2010   Method used:   Handwritten   RxID:   [id number]

## 2011-01-24 NOTE — Assessment & Plan Note (Signed)
Summary: 6 MONTH FOLLOW UP/[facility]   Vital Signs:  Patient profile:   77 year old male Weight:      130 pounds Temp:     97.8 degrees F oral Pulse rate:   76 / minute Pulse rhythm:   regular BP sitting:   130 / 64  (left arm) Cuff size:   large  Vitals Entered By: Mervin Hack CMA Duncan Dull) (January 04, 2011 8:04 AM) CC: 6 month follow-up   History of Present Illness: DOing "so-so" Having ongoing arthritic pain Hand swelling on right, right shoulder and neck pain Leg is better Uses the pain med less---max 2 a day recently. It still gives reasonable relief Tries to stay active  No heart trouble Did use NTG once---worked fine. Had some mild right chest pain No SOB NO palpitations No leg edema  Prostate okay Occ trouble initiating at night Nocturia at most once No troubling daytime freq  Allergies: 1)  * Vioxx (Rofecoxib) 2)  Penicillin G Potassium (Penicillin G Potassium) 3)  Lipitor (Atorvastatin Calcium) 4)  Vytorin (Ezetimibe-Simvastatin) 5)  * Tricor  Past History:  Past medical, surgical, family and social histories (including risk factors) reviewed for relevance to current acute and chronic problems.  Past Medical History: Reviewed history from 11/24/2008 and no changes required. Allergic rhinitis Coronary artery disease------------------------Dr Donnie Aho [phone] Hyperlipidemia Kidney stones BPH------------------------------------------------Dr Annabell Howells   [phone] Osteoarthritis Atrial fibrillation COPD  Past Surgical History: Reviewed history from 11/24/2008 and no changes required. Inguinal herniorrhaphy, left ([facility])  10/05 MI 03/03 Back surgery x 3 1990's TURP 2002 Pneumothorax right 1980's EGD/Colon negative Laural Benes) 05/03 Stress test 05/03 Cardiolite stress, negative EF 72% 04/05 Ureteroscopy/stone removal Annabell Howells) 07/07 Cardiolite negative 04/08 Arial fibrillation with COPD exac--11/09  Family History: Reviewed history from 06/01/2007 and  no changes required. Dad died @89  of old age Mom died @74  MI 3 brothers died of MI/CVA 3 other brothers living 1 sister died/2 living CAD throughout sibs No HTN or DM No cancer  Social History: Reviewed history from 06/04/2007 and no changes required. Retired--[employer] Married--1 child Former Smoker--quit with MI 3/03 Alcohol use-no Does lots of yard work and a big garden  Review of Systems       still has some numbness in left foot--goes back to back problems that Dr Murray Hodgkins gave shots for--last 3-4 wks Appetite is fine weight up a few pounds Generally sleeps okay  Physical Exam  General:  alert and normal appearance.   Neck:  supple, no masses, no thyromegaly, and no cervical lymphadenopathy.   Lungs:  normal respiratory effort, no intercostal retractions, no accessory muscle use, and normal breath sounds.   Heart:  normal rate, regular rhythm, no murmur, and no gallop.   Abdomen:  soft and non-tender.   Pulses:  faint in feet Extremities:  no edema Psych:  normally interactive, good eye contact, not anxious appearing, and not depressed appearing.     Impression & Recommendations:  Problem # 1:  OSTEOARTHRITIS (ICD-715.90) Assessment Improved ongoing pain but gets control leg is better after injections from Dr Murray Hodgkins  His updated medication list for this problem includes:    Vicodin 5-500 Mg Tabs (Hydrocodone-acetaminophen) .Marland Kitchen... 1 three times a day as needed for pain    Adult Aspirin Low Strength 81 Mg Tbdp (Aspirin) .Marland Kitchen... Take one by mouth daily  Problem # 2:  ATRIAL FIBRILLATION (ICD-427.31) Assessment: Unchanged  still regular will recheck thyroid tests since TSH borderline elevated last time  His updated medication list for this problem  includes:    Toprol Xl 50 Mg Tb24 (Metoprolol succinate) .Marland Kitchen... Take 1/2 tab by mouth daily    Adult Aspirin Low Strength 81 Mg Tbdp (Aspirin) .Marland Kitchen... Take one by mouth daily  Orders: Venipuncture (16109) TLB-TSH  (Thyroid Stimulating Hormone) (84443-TSH) TLB-T4 (Thyrox), Free [phone])  Problem # 3:  CORONARY ARTERY DISEASE (ICD-414.00) Assessment: Unchanged used nitro once but no clear problems stays active and no change in exercise tolerance  His updated medication list for this problem includes:    Nitrostat 0.4 Mg Subl (Nitroglycerin) .Marland Kitchen... Place 1 tab under tongue, til chestpain is gone, up to 3 doses, if no relief get help    Altace 5 Mg Caps (Ramipril) .Marland Kitchen... Take one by mouth daily    Toprol Xl 50 Mg Tb24 (Metoprolol succinate) .Marland Kitchen... Take 1/2 tab by mouth daily    Adult Aspirin Low Strength 81 Mg Tbdp (Aspirin) .Marland Kitchen... Take one by mouth daily  Problem # 4:  HYPERTROPHY PROSTATE W/O UR OBST & OTH LUTS (ICD-600.00) Assessment: Unchanged doing well on his med  Complete Medication List: 1)  Nitrostat 0.4 Mg Subl (Nitroglycerin) .... Place 1 tab under tongue, til chestpain is gone, up to 3 doses, if no relief get help 2)  Vicodin 5-500 Mg Tabs (Hydrocodone-acetaminophen) .Marland Kitchen.. 1 three times a day as needed for pain 3)  Rapaflo 8 Mg Caps (Silodosin) .... Take 1 by mouth once daily 4)  Amitriptyline Hcl 25 Mg Tabs (Amitriptyline hcl) .Marland Kitchen.. 1 at bedtime 5)  Altace 5 Mg Caps (Ramipril) .... Take one by mouth daily 6)  Toprol Xl 50 Mg Tb24 (Metoprolol succinate) .... Take 1/2 tab by mouth daily 7)  Adult Aspirin Low Strength 81 Mg Tbdp (Aspirin) .... Take one by mouth daily  Patient Instructions: 1)  Please schedule a follow-up appointment in 6 months .    Orders Added: 1)  Est. Patient Level IV [19147] 2)  Venipuncture [82956] 3)  TLB-TSH (Thyroid Stimulating Hormone) [84443-TSH] 4)  TLB-T4 (Thyrox), Free [21308-MV7Q]    Current Allergies (reviewed today): * VIOXX (ROFECOXIB) PENICILLIN G POTASSIUM (PENICILLIN G POTASSIUM) LIPITOR (ATORVASTATIN CALCIUM) VYTORIN (EZETIMIBE-SIMVASTATIN) * TRICOR

## 2011-01-27 ENCOUNTER — Emergency Department (HOSPITAL_COMMUNITY): Payer: Medicare Other

## 2011-01-27 ENCOUNTER — Inpatient Hospital Stay (HOSPITAL_COMMUNITY)
Admission: EM | Admit: 2011-01-27 | Discharge: 2011-01-31 | DRG: 192 | Disposition: A | Discharge status: Home Health | Payer: Medicare Other | Attending: Internal Medicine | Admitting: Internal Medicine

## 2011-01-27 DIAGNOSIS — E785 Hyperlipidemia, unspecified: Secondary | ICD-10-CM | POA: Diagnosis present

## 2011-01-27 DIAGNOSIS — J441 Chronic obstructive pulmonary disease with (acute) exacerbation: Principal | ICD-10-CM | POA: Diagnosis present

## 2011-01-27 DIAGNOSIS — I4891 Unspecified atrial fibrillation: Secondary | ICD-10-CM | POA: Diagnosis present

## 2011-01-27 DIAGNOSIS — Z7901 Long term (current) use of anticoagulants: Secondary | ICD-10-CM

## 2011-01-27 DIAGNOSIS — E875 Hyperkalemia: Secondary | ICD-10-CM | POA: Diagnosis present

## 2011-01-27 DIAGNOSIS — I2510 Atherosclerotic heart disease of native coronary artery without angina pectoris: Secondary | ICD-10-CM | POA: Diagnosis present

## 2011-01-27 DIAGNOSIS — Z7982 Long term (current) use of aspirin: Secondary | ICD-10-CM

## 2011-01-27 DIAGNOSIS — Z9861 Coronary angioplasty status: Secondary | ICD-10-CM

## 2011-01-27 DIAGNOSIS — M1990 Unspecified osteoarthritis, unspecified site: Secondary | ICD-10-CM | POA: Diagnosis present

## 2011-01-27 DIAGNOSIS — F17200 Nicotine dependence, unspecified, uncomplicated: Secondary | ICD-10-CM | POA: Diagnosis present

## 2011-01-27 DIAGNOSIS — E039 Hypothyroidism, unspecified: Secondary | ICD-10-CM | POA: Diagnosis present

## 2011-01-27 DIAGNOSIS — N400 Enlarged prostate without lower urinary tract symptoms: Secondary | ICD-10-CM | POA: Diagnosis present

## 2011-01-27 LAB — CBC
HCT: 45.1 % (ref 39.0–52.0)
MCH: 31.9 pg (ref 26.0–34.0)
RBC: 4.8 MIL/uL (ref 4.22–5.81)
RDW: 12.8 % (ref 11.5–15.5)

## 2011-01-27 LAB — DIFFERENTIAL
Eosinophils Absolute: 0 10*3/uL (ref 0.0–0.7)
Eosinophils Relative: 0 % (ref 0–5)
Lymphocytes Relative: 11 % — ABNORMAL LOW (ref 12–46)
Neutrophils Relative %: 78 % — ABNORMAL HIGH (ref 43–77)

## 2011-01-27 LAB — URINALYSIS, ROUTINE W REFLEX MICROSCOPIC
Nitrite: NEGATIVE
Protein, ur: NEGATIVE mg/dL
Specific Gravity, Urine: 1.016 (ref 1.005–1.030)
Urine Glucose, Fasting: NEGATIVE mg/dL
Urobilinogen, UA: 0.2 mg/dL (ref 0.0–1.0)
pH: 7 (ref 5.0–8.0)

## 2011-01-27 LAB — COMPREHENSIVE METABOLIC PANEL
AST: 39 U/L — ABNORMAL HIGH (ref 0–37)
Alkaline Phosphatase: 75 U/L (ref 39–117)
Calcium: 9.6 mg/dL (ref 8.4–10.5)
Chloride: 98 mEq/L (ref 96–112)
Creatinine, Ser: 1.21 mg/dL (ref 0.4–1.5)
GFR calc Af Amer: >60 mL/min (ref >60)
Potassium: 5.6 mEq/L — ABNORMAL HIGH (ref 3.5–5.1)
Sodium: 134 mEq/L — ABNORMAL LOW (ref 135–145)
Total Bilirubin: 2 mg/dL — ABNORMAL HIGH (ref 0.3–1.2)

## 2011-01-28 LAB — BASIC METABOLIC PANEL
BUN: 22 mg/dL (ref 6–23)
CO2: 26 mEq/L (ref 19–32)
Calcium: 9.9 mg/dL (ref 8.4–10.5)
GFR calc Af Amer: >60 mL/min (ref >60)
Sodium: 140 mEq/L (ref 135–145)

## 2011-01-28 LAB — BRAIN NATRIURETIC PEPTIDE: Pro B Natriuretic peptide (BNP): 138 pg/mL — ABNORMAL HIGH (ref 0.0–100.0)

## 2011-01-28 LAB — TSH: TSH: 11.307 u[IU]/mL — ABNORMAL HIGH (ref 0.350–4.500)

## 2011-01-28 LAB — URINE CULTURE
Colony Count: NO GROWTH
Culture  Setup Time: 201202051953

## 2011-01-28 LAB — T3, FREE: T3, Free: 2.8 pg/mL (ref 2.3–4.2)

## 2011-01-28 LAB — CBC
Hemoglobin: 14.6 g/dL (ref 13.0–17.0)
MCH: 29.6 pg (ref 26.0–34.0)
MCV: 91.7 fL (ref 78.0–100.0)
RBC: 4.93 MIL/uL (ref 4.22–5.81)

## 2011-01-28 LAB — T4, FREE: Free T4: 1.48 ng/dL (ref 0.80–1.80)

## 2011-01-29 LAB — BASIC METABOLIC PANEL
CO2: 25 mEq/L (ref 19–32)
Chloride: 101 mEq/L (ref 96–112)
GFR calc non Af Amer: >60 mL/min (ref >60)
Glucose, Bld: 139 mg/dL — ABNORMAL HIGH (ref 70–99)
Potassium: 4.4 mEq/L (ref 3.5–5.1)
Sodium: 137 mEq/L (ref 135–145)

## 2011-01-29 LAB — CBC
HCT: 45.2 % (ref 39.0–52.0)
Hemoglobin: 15.3 g/dL (ref 13.0–17.0)
RBC: 4.84 MIL/uL (ref 4.22–5.81)
RDW: 13 % (ref 11.5–15.5)
WBC: 13.2 10*3/uL — ABNORMAL HIGH (ref 4.0–10.5)

## 2011-01-30 LAB — BASIC METABOLIC PANEL
CO2: 23 mEq/L (ref 19–32)
Calcium: 9.5 mg/dL (ref 8.4–10.5)
Glucose, Bld: 129 mg/dL — ABNORMAL HIGH (ref 70–99)
Sodium: 140 mEq/L (ref 135–145)

## 2011-01-30 LAB — CBC
HCT: 43.8 % (ref 39.0–52.0)
Hemoglobin: 15 g/dL (ref 13.0–17.0)
MCHC: 34.2 g/dL (ref 30.0–36.0)
MCV: 93.2 fL (ref 78.0–100.0)

## 2011-01-30 NOTE — Assessment & Plan Note (Signed)
Summary: COUGH, CONGESTION   Vital Signs:  Patient profile:   77 year old male Weight:      129 pounds O2 Sat:      88 % on Room air Temp:     97.8 degrees F oral Pulse rate:   87 / minute Pulse rhythm:   regular Resp:     28 per minute BP sitting:   135 / 63  (left arm) Cuff size:   regular  Vitals Entered By: Mervin Hack CMA Duncan Dull) (January 23, 2011 10:11 AM)  O2 Flow:  Room air CC: cough   History of Present Illness: Having trouble wiht his breathing Stays blocked up in head Started  ~4 days ago  No sig nasal drainage Some PND though--has bad taste Lots of cough--- lots of mucus when he uses simple saline (lots clear or white)  No fever Has had some right chest and abd pain--mostly with cough or trying to walk Has smothering sensation when in bed---now in recliner  No ankle swelling  Allergies: 1)  * Vioxx (Rofecoxib) 2)  Penicillin G Potassium (Penicillin G Potassium) 3)  Lipitor (Atorvastatin Calcium) 4)  Vytorin (Ezetimibe-Simvastatin) 5)  * Tricor  Past History:  Past medical, surgical, family and social histories (including risk factors) reviewed for relevance to current acute and chronic problems.  Past Medical History: Reviewed history from 01/04/2011 and no changes required. Allergic rhinitis Coronary artery disease------------------------Dr Donnie Aho [phone] Hyperlipidemia Kidney stones BPH------------------------------------------------Dr Annabell Howells   [phone] Osteoarthritis Atrial fibrillation COPD Hypothyroidism  Past Surgical History: Reviewed history from 11/24/2008 and no changes required. Inguinal herniorrhaphy, left ([facility])  10/05 MI 03/03 Back surgery x 3 1990's TURP 2002 Pneumothorax right 1980's EGD/Colon negative Laural Benes) 05/03 Stress test 05/03 Cardiolite stress, negative EF 72% 04/05 Ureteroscopy/stone removal Annabell Howells) 07/07 Cardiolite negative 04/08 Arial fibrillation with COPD exac--11/09  Family History: Reviewed  history from 06/01/2007 and no changes required. Dad died @89  of old age Mom died @74  MI 3 brothers died of MI/CVA 3 other brothers living 1 sister died/2 living CAD throughout sibs No HTN or DM No cancer  Social History: Reviewed history from 06/04/2007 and no changes required. Retired--[employer] Married--1 child Former Smoker--quit with MI 3/03 Alcohol use-no Does lots of yard work and a big garden  Review of Systems       appetite is poor since this started weight is stable  Physical Exam  General:  alert.  Tachypneic with just getting on table Head:  no sinus tenderness Ears:  R ear normal and L ear normal.   Nose:  no sig inflammaiton Mouth:  no erythema and no exudates.   Neck:  supple, no masses, no thyromegaly, no JVD, and no cervical lymphadenopathy.   Lungs:  normal respiratory effort, no intercostal retractions, no accessory muscle use, no dullness, and no crackles.  Mild decreased breath sounds with mild exp prolongation and moderate wheezing Heart:  normal rate, regular rhythm, no murmur, and no gallop.   Abdomen:  soft, non-tender, no masses, no hepatomegaly, and no splenomegaly.   Extremities:  no edema   Impression & Recommendations:  Problem # 1:  DYSPNEA/SHORTNESS OF BREATH (ICD-786.09) Assessment New seems to be COPD exacerbation CXR shows no CHF or infiltrate EKG is benign---looks like a normal variant  will treat with prednisone antibiotic just in case tramadol for cough  His updated medication list for this problem includes:    Altace 5 Mg Caps (Ramipril) .Marland Kitchen... Take one by mouth daily    Toprol Xl  50 Mg Tb24 (Metoprolol succinate) .Marland Kitchen... Take 1/2 tab by mouth daily  Orders: T-2 View CXR (71020TC) EKG w/ Interpretation (93000)  Problem # 2:  BRONCHITIS- ACUTE (ICD-466.0) Assessment: New  will use doxy just in case bacterial component  His updated medication list for this problem includes:    Doxycycline Hyclate 100 Mg Caps  (Doxycycline hyclate) .Marland Kitchen... 1 tab by mouth two times a day for bronchitis  Complete Medication List: 1)  Nitrostat 0.4 Mg Subl (Nitroglycerin) .... Place 1 tab under tongue, til chestpain is gone, up to 3 doses, if no relief get help 2)  Vicodin 5-500 Mg Tabs (Hydrocodone-acetaminophen) .Marland Kitchen.. 1 three times a day as needed for pain 3)  Rapaflo 8 Mg Caps (Silodosin) .... Take 1 by mouth once daily 4)  Amitriptyline Hcl 25 Mg Tabs (Amitriptyline hcl) .Marland Kitchen.. 1 at bedtime 5)  Altace 5 Mg Caps (Ramipril) .... Take one by mouth daily 6)  Toprol Xl 50 Mg Tb24 (Metoprolol succinate) .... Take 1/2 tab by mouth daily 7)  Adult Aspirin Low Strength 81 Mg Tbdp (Aspirin) .... Take one by mouth daily 8)  Levothyroxine Sodium 25 Mcg Tabs (Levothyroxine sodium) .... Take 1 by mouth once daily 9)  Prednisone 20 Mg Tabs (Prednisone) .... 2 tabs daily for 7 days, then 1 tab daily for 7 days for emphysema flare 10)  Doxycycline Hyclate 100 Mg Caps (Doxycycline hyclate) .Marland Kitchen.. 1 tab by mouth two times a day for bronchitis 11)  Tramadol Hcl 50 Mg Tabs (Tramadol hcl) .... 1/2-1 tab by mouth three times a day as needed for cough  Patient Instructions: 1)  Keep regular appointments 2)  Call next week for appointment if not mostly better from shortness of breath Prescriptions: TRAMADOL HCL 50 MG TABS (TRAMADOL HCL) 1/2-1 tab by mouth three times a day as needed for cough  #30 x 0   Entered and Authorized by:   Cindee Salt MD   Signed by:   Cindee Salt MD on 01/23/2011   Method used:   Electronically to        [facility]  [address] [phone]* (retail)       [address]       [place], [place]  [address]       Ph: [phone]       Fax: [phone]   RxID:   [id number] DOXYCYCLINE HYCLATE 100 MG CAPS (DOXYCYCLINE HYCLATE) 1 tab by mouth two times a day for bronchitis  #14 x 0   Entered and Authorized by:   Cindee Salt MD   Signed by:   Cindee Salt MD on 01/23/2011    Method used:   Electronically to        [facility]  [address] [phone]* (retail)       [address]       [place], [place]  [address]       Ph: [phone]       Fax: [phone]   RxID:   [id number] PREDNISONE 20 MG TABS (PREDNISONE) 2 tabs daily for 7 days, then 1 tab daily for 7 days for emphysema flare  #21 x 0   Entered and Authorized by:   Cindee Salt MD   Signed by:   Cindee Salt MD on 01/23/2011   Method used:   [facility] to        [facility]  [address] [id number] (retail)       [address]       [place], [place]  [address]       Ph: [phone]       Fax: [phone]   RxID:   [id number]    Orders Added: 1)  T-2 View CXR [71020TC] 2)  EKG w/ Interpretation [93000] 3)  Est. Patient Level IV [24401]    Current Allergies (reviewed today): * VIOXX (ROFECOXIB) PENICILLIN G POTASSIUM (PENICILLIN G POTASSIUM) LIPITOR (ATORVASTATIN CALCIUM) VYTORIN (EZETIMIBE-SIMVASTATIN) * TRICOR

## 2011-01-31 LAB — CBC
HCT: 42.6 % (ref 39.0–52.0)
MCHC: 34 g/dL (ref 30.0–36.0)
MCV: 92.6 fL (ref 78.0–100.0)
RDW: 13.3 % (ref 11.5–15.5)

## 2011-01-31 LAB — DIFFERENTIAL
Basophils Absolute: 0 10*3/uL (ref 0.0–0.1)
Eosinophils Relative: 0 % (ref 0–5)
Lymphocytes Relative: 6 % — ABNORMAL LOW (ref 12–46)
Lymphs Abs: 0.7 10*3/uL (ref 0.7–4.0)
Monocytes Absolute: 0.6 10*3/uL (ref 0.1–1.0)

## 2011-01-31 LAB — BASIC METABOLIC PANEL
BUN: 29 mg/dL — ABNORMAL HIGH (ref 6–23)
Calcium: 9 mg/dL (ref 8.4–10.5)
Creatinine, Ser: 1.04 mg/dL (ref 0.4–1.5)
GFR calc non Af Amer: >60 mL/min (ref >60)
Glucose, Bld: 132 mg/dL — ABNORMAL HIGH (ref 70–99)

## 2011-02-02 NOTE — H&P (Signed)
[name], LUFT               ACCOUNT NO.:  [id number]  MEDICAL RECORD NO.:  [id number]           PATIENT TYPE:  E  LOCATION:  [facility]                         FACILITY:  [facility]  PHYSICIAN:  Mariea Stable, MD   DATE OF BIRTH:  1928-01-16  DATE OF ADMISSION:  01/27/2011 DATE OF DISCHARGE:                             HISTORY & PHYSICAL   PRIMARY CARE PHYSICIAN:  Karie Schwalbe, MD  CHIEF COMPLAINT:  Shortness of breath.  HISTORY OF PRESENT ILLNESS:  [name] is an 77 year old gentleman with past medical history significant for COPD who presents with chief complaint of shortness of breath times 1 week.  He reports that over this week, he has had progressive shortness of breath associated with cough.  Cough is productive of whitish brown sputum.  He saw his primary care physician on February 1 and was given steroids along with doxycycline.  He reports that the day prior to admission, his symptoms got more worse prompting his visit to the emergency department today. The patient reports that he has never used oxygen at home and is fully functional.  He notes some right-sided chest pain associated with his cough.  He denies any fevers.  REVIEW OF SYSTEMS:  As per HPI.  All other systems reviewed are negative.  PAST MEDICAL HISTORY: 1. Allergic rhinitis. 2. Coronary artery disease followed by Dr. Donnie Aho. 3. Hyperlipidemia. 4. Nephrolithiasis. 5. BPH followed by Dr. Annabell Howells. 6. Osteoarthritis. 7. Atrial fibrillation. 8. COPD. 9. Hypothyroidism.  MEDICATIONS: 1. Nitrostat 0.4 mg sublingual p.r.n. chest pain up to 3 doses. 2. Vicodin 5/500 mg 1 tablet p.o. t.i.d. p.r.n. pain. 3. Rapaflo 8 mg p.o. daily. 4. Amitriptyline 25 mg p.o. at bedtime. 5. Altace 5 mg p.o. daily. 6. Toprol-XL 50 mg half a tablet p.o. daily. 7. Aspirin 81 mg p.o. daily. 8. Synthroid 25 mcg p.o. daily. 9. Prednisone taper. 10.Doxycycline 100 mg p.o. b.i.d. 11.Tramadol 50 mg half a tablet to one  tablet p.o. b.i.d. p.r.n.     cough.  ALLERGIES:  STATINS, TRICOR, PENICILLIN, VIOXX.  SOCIAL HISTORY:  The patient lives with his wife.  He is fully functional as stated above.  He is a former smoker and quit in 2003.  He denies any alcohol or drug use.  FAMILY HISTORY:  Noncontributory.  PHYSICAL EXAMINATION:  VITAL SIGNS:  Temperature 98.1, blood pressure 140/72, heart rate of 76, respirations 24, oxygen saturation 96% on room air. GENERAL:  He is an older man sitting in bed in no acute distress. HEENT:  Head is normocephalic, atraumatic.  The patient is wearing glasses.  Pupils are equally round and reactive to light and accommodation.  Extraocular movements are intact.  Sclerae anicteric. Mucous membranes are moist.  There is no oropharyngeal lesions. NECK:  Supple without any thyromegaly.  There is no JVD. CARDIOVASCULAR:  Heart sounds were distant, S1 and S2 is within normal limits.  There are no obvious murmurs, gallops, or rubs. LUNGS:  The patient has a normal respiratory effort with fair air movement bilaterally.  There is diffuse expiratory wheezes with a prolonged expiratory phase.  There are no crackles or rhonchi.  ABDOMEN:  Positive bowel sounds, soft, nontender, nondistended with no organomegaly. EXTREMITIES:  There is no cyanosis, clubbing, or edema. NEUROLOGIC:  The patient is awake, alert, and oriented x3.  Cranial nerves II through XII are grossly intact.  Motor is intact.  Sensation is intact.  LABORATORY DATA:  WBC 9.1, hemoglobin 15.3, platelets 228.  Sodium 134, potassium 5.6 (hemolyzed specimen), chloride 98, bicarb 28, glucose 121, BUN 20, creatinine 1.21, total bilirubin 2.0, alkaline phosphatase 75, AST 39, ALT 19, total protein 67, albumin 3.8, calcium 9.6.  Urinalysis negative.  IMAGING:  Chest x-ray shows stable chronic obstructive lung disease and biapical scarring.  No acute findings demonstrated.  ASSESSMENT AND PLAN: 1. Chronic obstructive  pulmonary disease exacerbation.  The patient     has a history of COPD with symptoms consistent with an     exacerbation.  At this point, we will admit to observation, IV Solu-     Medrol x1 followed by prednisone taper.  We will use albuterol q.6     and q.2 p.r.n. along with Atrovent q.6.  We will also continue with     oral doxycycline for a total of 5 days.  The patient denies any     other symptoms and examination does not reveal any other etiology     for shortness of breath. 2. Hyperkalemia.  This is secondary to hemolysis.  We will repeat     basic metabolic panel in the morning. 3. Minimally elevated AST, unclear etiology, but we will follow up as     an outpatient. 4. Hypothyroidism.  We will continue the patient's Synthroid.  May     also consider checking along with morning labs.  This has  not been     done as an outpatient recently. 5. Coronary artery disease.  We will continue with the patient's     aspirin and beta-blocker.  Of note, the patient has statins as well     as fibrate listed as an allergy. 6. Benign prostatic hypertrophy.  We will continue with the patient's     alpha-blocker.     Mariea Stable, MD     MA/MEDQ  D:  01/27/2011  T:  01/27/2011  Job:  [id number]  cc:   Karie Schwalbe, MD  Electronically Signed by Mariea Stable MD on 01/28/2011 09:31:27 AM

## 2011-02-03 NOTE — Discharge Summary (Signed)
[name], [name]               ACCOUNT NO.:  [id number]  MEDICAL RECORD NO.:  [id number]           PATIENT TYPE:  I  LOCATION:  4732                         FACILITY:  [facility]  PHYSICIAN:  Rock Nephew, MD       DATE OF BIRTH:  22-Dec-1928  DATE OF ADMISSION:  01/27/2011 DATE OF DISCHARGE:                        DISCHARGE SUMMARY - REFERRING   PRIMARY CARE PHYSICIAN:  Dr. Karie Schwalbe, MD  DISCHARGE DIAGNOSES:  Are as follows: 1. Dyspnea secondary to COPD exacerbation. 2. Tobacco abuse. 3. Atrial fibrillation with RVR started anticoagulation secondary to     [place] score of II. 4. Mild had mild hypothyroidism on Synthroid as well as a history of     hypertension as well as history of benign prostatic hypertrophy. 5. Other medical history of coronary artery disease, osteoarthritis,     history of nephrolithiasis, and history of hyperlipidemia.  DISCHARGE MEDICATIONS:  For the patient are as follows: 1. Albuterol 2 puffs q.4 h p.r.n. shortness of breath. 2. Cardizem CD ER 180 mg by mouth twice daily. 3. Prednisone taper 60 mg for 2 days, 50 mg for 2 days, 40 mg for 2     days, 30 mg for 2 days, 20 mg for 2 days, 10 mg for 2 days and then     stopping. 4. Spiriva 18 mcg inhaled daily. 5. Warfarin 5 mg by mouth daily. 6. Altace 5 mg by mouth daily. 7. Amitriptyline 25 mg by mouth daily at bedtime. 8. Aspirin 81 mg p.o. daily. 9. Doxycycline 100 mg by mouth twice daily for 5 days. 10.Vicodin 1 tablet by mouth 3 times a day as needed. 11.Levothyroxine 25 mcg by mouth daily. 12.Nitroglycerin 0.4 mg 1 tablet under tongue every 5 minutes with the     3 doses as needed. 13.Rapaflo 8 mg 1 capsule by mouth daily.  DISPOSITION:  The patient is discharged home.  The patient does not need a home health PT/OT or RN.  Home health did recommend a home health RN; however, the patient refused that.  The patient's diet should be heart- healthy.  CONSULTATIONS:  On this case with Dr.  Viann Fish.  PROCEDURES PERFORMED:  The patient had a chest x-ray on January 27, 2011 which was stable, chronic obstructive lung disease and biapical scarring, no acute findings demonstrated.  FOLLOWUP:  The patient should follow up with Dr. Felicity Coyer in 1 week. The patient should also follow up with Dr. Viann Fish in 1 to 2 weeks.  The patient should have his Coumadin level checked by Dr. Viann Fish on February 04, 2011.  The patient should stay in for smoking.  INITIAL HISTORY AND PHYSICAL AND CHIEF COMPLAINT:  Shortness of breath.  [name] is an 77 year old gentleman with a past medical history significant for COPD versus chief complaint shortness of breath times 1 week.  He reports over the last week, he has had progressive shortness of breath associated with cough.  Cough is productive of whitish sputum. He saw his primary care physician for the first, given steroids along with doxycycline.  He reports to the day prior to admission, his  shortness of breath got more worse, prompting him to the Emergency Department today.  HOSPITAL COURSE: 1. COPD exacerbation.  The patient was admitted to the hospital.  The     patient was nebulizations as well as Solu-Medrol.  The patient's     COPD exacerbation improved.  The patient felt better.  The patient     was continued on doxycycline for COPD. 2. AFib with RPR.  The patient was found with RPR.  The patient was     started on Cardizem drip at 5 mg; however, the drip was later     transitioned over to oral Cardizem.  We consulted Dr. Viann Fish for recommendation of anticoagulation.  The patient had a     CHADS II score of 2 and Dr. Donnie Aho agreed with anticoagulation.  He     started the patient on warfarin.  I will have the patient to follow     with Dr. Donnie Aho and have his INR drawn and check with Dr. Donnie Aho on     February 04, 2011. 3. Hypothyroidism.  The patient had thyroid function tests done.  The      patient had a TSH that was elevated at 11.307.  The patient's free     T4 was 1.48 and the patient's free T3 was 2.8.  The patient should     have repeat thyroid function test done in about 5 weeks.  The     patient's thyroid medication may need to be increased slightly. 4. Other medical problems of hypertension was stable, BPH was stable.  The     patient will also undergone warfarin teaching before discharge.     Rock Nephew, MD     NH/MEDQ  D:  01/31/2011  T:  01/31/2011  Job:  [id number]  cc:   Karie Schwalbe, MD Georga Hacking, M.D.  Electronically Signed by Rock Nephew MD on 01/31/2011 06:18:45 PM

## 2011-02-04 ENCOUNTER — Ambulatory Visit (INDEPENDENT_AMBULATORY_CARE_PROVIDER_SITE_OTHER): Payer: Medicare Other | Admitting: Internal Medicine

## 2011-02-04 ENCOUNTER — Encounter: Payer: Self-pay | Admitting: Internal Medicine

## 2011-02-04 DIAGNOSIS — Z7901 Long term (current) use of anticoagulants: Secondary | ICD-10-CM

## 2011-02-04 DIAGNOSIS — I2510 Atherosclerotic heart disease of native coronary artery without angina pectoris: Secondary | ICD-10-CM

## 2011-02-04 DIAGNOSIS — I4891 Unspecified atrial fibrillation: Secondary | ICD-10-CM

## 2011-02-04 DIAGNOSIS — J449 Chronic obstructive pulmonary disease, unspecified: Secondary | ICD-10-CM

## 2011-02-04 DIAGNOSIS — Z5181 Encounter for therapeutic drug level monitoring: Secondary | ICD-10-CM

## 2011-02-07 NOTE — Consult Note (Signed)
[name], [name]               ACCOUNT NO.:  [id number]  MEDICAL RECORD NO.:  [id number]           PATIENT TYPE:  I  LOCATION:  4732                         FACILITY:  [facility]  PHYSICIAN:  Georga Hacking, M.D.DATE OF BIRTH:  05/16/1928  DATE OF CONSULTATION:  01/31/2011                                 CONSULTATION   REASON FOR CONSULTATION:  Atrial fibrillation.  I was asked to see this 77 year old male for evaluation of atrial fibrillation which is paroxysmal.  The patient was admitted with shortness of breath on February 5 where he had worsening dyspnea for 1 week.  He had had productive cough of brownish white sputum and was thought to have a COPD exacerbation treated with steroids as well as doxycycline.  His symptoms were worsened and when he presented to the emergency room, he was found to be in atrial fibrillation with a rapid ventricular response.  He was treated with a diltiazem drip.  An echocardiogram was performed showing moderate concentric hypertrophy with normal systolic function.  Left atrial size was normal.  There was no mitral regurgitation.  The right ventricle appeared normal.  He converted to sinus rhythm transiently on atrial fibrillation on a Cardizem drip but converted back to atrial fibrillation last night.  I was consulted late of the day yesterday to see the patient.  The patient currently feels well and says he wants to go home.  He has had no recurrent chest discomfort.  He does have a history of coronary artery disease with a previous stent of the circumflex in March of 2003. Cardiac catheterization done in May 2009 showed a moderately severe stenosis in the right coronary artery, midvessel 50% LAD stenosis, intermediate 50-60% LAD stenosis, 30% in-stent stenosis of the circumflex.  He has not had any recurrence of angina.  He denies PND, orthopnea, and does not have significant edema.  Says he quit smoking about a month ago.  PAST MEDICAL  HISTORY:  Previous history of significant COPD.  He has had no history of hypertension or diabetes.  He has some peptic ulcer disease.  He had some reflux type symptoms in the past.  He has had a previous pneumothorax in the past.  He has had lumbar disk disease in the past.  PAST SURGICAL HISTORY:  He had lumbar laminectomy x3.  He has had a TURP in the past.  He has had inguinal hernia repair as well as removal of kidney stones in the past.  ALLERGIES:  Reportedly intolerance to STATINS, TRICOR, PENICILLIN, and VIOXX.  SOCIAL HISTORY:  He lives with his wife.  He says that he quit smoking about a month ago.  He does not have any alcohol or drug use.  He is retired from [employer], attends [employer].  FAMILY HISTORY:  Mother died of heart attack at age 47.  Father died of old age at age 36.  REVIEW OF SYSTEMS:  His weight has been stable.  He wears glasses.  He has no eye, nose, or throat problems.  He has significant arthritis involving his back, neck, and shoulders for which he takes Celebrex.  He has no constipation.  He does have a history of TURP and BPH and has some mild nocturia.  He complains of some mild claudication when he walks.  Other than as noted above, the remainder review of systems is unremarkable.  PHYSICAL EXAMINATION:  GENERAL:  He is a pleasant male currently appearing in no acute distress, was in atrial fibrillation with rapid response. VITAL SIGNS:  Blood pressure is currently 134/69, pulse is currently 120 and irregular. SKIN:  Warm and dry. ENT:  EOMI.  PERRLA.  CNS clear.  Fundi not examined.  Pharynx negative. NECK:  Supple without masses, JVD, thyromegaly, or bruits.  No JVD noted. LUNGS:  There are bilateral wheezes noted at bases.  No rales heard. CARDIOVASCULAR:  Irregular rhythm.  No murmur. ABDOMEN:  Soft and nontender.  Femoral pulses are 2+.  Peripheral pulses are mildly diminished.  There is no peripheral edema noted.  A  12-lead EKG portable shows hyperinflation with apical scarring, no evidence of pneumonia.  Laboratory data shows a white count of 12,400 with 90% neutrophils.  BUN 29, creatinine 1.04.  TSH is 11.307.  B natriuretic peptide is 138.  IMPRESSION: 1. Atrial fibrillation with rapid ventricular response paroxysmal. 2. Coronary artery disease with previous stenting of the circumflex     and patent on catheterization in 2009, moderately severe stenosis     in right coronary artery, moderate disease in left anterior     descending. 3. Hyperlipidemia with intolerance to statin therapy. 4. History of hypertension.  RECOMMENDATIONS:  The patient has a [place] score of 2.  At this time I would recommend anticoagulation with warfarin.  His echocardiogram does not show significant left atrial enlargement because of his COPD.  I would treat him with an increased dose of diltiazem for rate control. He should continue to abstain from smoking cessation.  He appears to be mildly hypothyroid and this will need to be adjusted.     Georga Hacking, M.D.     WST/MEDQ  D:  01/31/2011  T:  01/31/2011  Job:  [id number]  cc:   Karie Schwalbe, MD  Electronically Signed by Lacretia Nicks. Donnie Aho M.D. on 02/07/2011 09:32:10 AM

## 2011-02-11 ENCOUNTER — Encounter: Payer: Self-pay | Admitting: Internal Medicine

## 2011-02-11 ENCOUNTER — Ambulatory Visit (INDEPENDENT_AMBULATORY_CARE_PROVIDER_SITE_OTHER): Payer: Medicare Other

## 2011-02-11 DIAGNOSIS — Z5181 Encounter for therapeutic drug level monitoring: Secondary | ICD-10-CM

## 2011-02-11 DIAGNOSIS — I4891 Unspecified atrial fibrillation: Secondary | ICD-10-CM

## 2011-02-11 DIAGNOSIS — Z7901 Long term (current) use of anticoagulants: Secondary | ICD-10-CM

## 2011-02-13 ENCOUNTER — Telehealth: Payer: Self-pay | Admitting: Internal Medicine

## 2011-02-13 NOTE — Assessment & Plan Note (Signed)
Summary: HOSP F/U AT [place]   Vital Signs:  Patient profile:   77 year old male Weight:      129 pounds O2 Sat:      98 % on Room air Temp:     98.1 degrees F oral Pulse rate:   55 / minute Pulse rhythm:   regular BP sitting:   117 / 55  (left arm) Cuff size:   regular  Vitals Entered By: Mervin Hack CMA Duncan Dull) (February 04, 2011 3:19 PM)  O2 Flow:  Room air CC: hospital follow-up   History of Present Illness: Wound up being hospitalized for the COPD exacerbation Also had flare of atrial fib with rapid vent response coumadin started  still having mild breathing difficulty Has cough still--esp at night NO fever Not much wheezing  Occ feels palpitations with coughing spells some dizziness but no syncope started on diltiazem also for heart  Allergies: 1)  * Vioxx (Rofecoxib) 2)  Penicillin G Potassium (Penicillin G Potassium) 3)  Lipitor (Atorvastatin Calcium) 4)  Vytorin (Ezetimibe-Simvastatin) 5)  * Tricor  Past History:  Past medical, surgical, family and social histories (including risk factors) reviewed for relevance to current acute and chronic problems.  Past Medical History: Reviewed history from 01/04/2011 and no changes required. Allergic rhinitis Coronary artery disease------------------------Dr Donnie Aho [phone] Hyperlipidemia Kidney stones BPH------------------------------------------------Dr Annabell Howells   [phone] Osteoarthritis Atrial fibrillation COPD Hypothyroidism  Past Surgical History: Reviewed history from 11/24/2008 and no changes required. Inguinal herniorrhaphy, left ([place])  10/05 MI 03/03 Back surgery x 3 1990's TURP 2002 Pneumothorax right 1980's EGD/Colon negative Laural Benes) 05/03 Stress test 05/03 Cardiolite stress, negative EF 72% 04/05 Ureteroscopy/stone removal Annabell Howells) 07/07 Cardiolite negative 04/08 Arial fibrillation with COPD exac--11/09  Family History: Reviewed history from 06/01/2007 and no changes  required. Dad died @89  of old age Mom died @74  MI 3 brothers died of MI/CVA 3 other brothers living 1 sister died/2 living CAD throughout sibs No HTN or DM No cancer  Social History: Reviewed history from 06/04/2007 and no changes required. Retired--[employer] Married--1 child Former Smoker--quit with MI 3/03 Alcohol use-no Does lots of yard work and a big garden  Review of Systems       appetite is okay now weight is stable  Physical Exam  General:  alert and normal appearance.   Neck:  supple, no masses, no thyromegaly, and no cervical lymphadenopathy.   Lungs:  normal respiratory effort, no intercostal retractions, no accessory muscle use, and no crackles.   Fairly normal exp phase but some exp rhonchi Heart:  regular rhythm, no gallop, and bradycardia.   Extremities:  no edema Psych:  normally interactive, good eye contact, not anxious appearing, and not depressed appearing.     Impression & Recommendations:  Problem # 1:  COPD (ICD-496) Assessment Improved recent hospitatlization for exacerbation doing better now discussed slow resumption of normal activities  His updated medication list for this problem includes:    Proair Hfa 108 (90 Base) Mcg/act Aers (Albuterol sulfate) .Marland Kitchen... Take 2 puffs every 4 hours as needed for shortness of breath    Spiriva Handihaler 18 Mcg Caps (Tiotropium bromide monohydrate) ..... Inhaled daily  Problem # 2:  ATRIAL FIBRILLATION (ICD-427.31) Assessment: Deteriorated had another spell now seems to be back in sinus but slow on the cardizem now on coumadin due th CHADS 2 score  His updated medication list for this problem includes:    Toprol Xl 50 Mg Tb24 (Metoprolol succinate) .Marland Kitchen... Take 1/2 tab by mouth daily  Adult Aspirin Low Strength 81 Mg Tbdp (Aspirin) .Marland Kitchen... Take one by mouth daily    Cardizem Cd 180 Mg Xr24h-cap (Diltiazem hcl coated beads) .Marland Kitchen... Take 1 by mouth two times a day    Coumadin 5 Mg Tabs (Warfarin sodium)  .Marland Kitchen... As directed  Problem # 3:  CORONARY ARTERY DISEASE (ICD-414.00) Assessment: Unchanged no apparent worsening  His updated medication list for this problem includes:    Nitrostat 0.4 Mg Subl (Nitroglycerin) .Marland Kitchen... Place 1 tab under tongue, til chestpain is gone, up to 3 doses, if no relief get help    Altace 5 Mg Caps (Ramipril) .Marland Kitchen... Take one by mouth daily    Toprol Xl 50 Mg Tb24 (Metoprolol succinate) .Marland Kitchen... Take 1/2 tab by mouth daily    Adult Aspirin Low Strength 81 Mg Tbdp (Aspirin) .Marland Kitchen... Take one by mouth daily    Cardizem Cd 180 Mg Xr24h-cap (Diltiazem hcl coated beads) .Marland Kitchen... Take 1 by mouth two times a day  Complete Medication List: 1)  Nitrostat 0.4 Mg Subl (Nitroglycerin) .... Place 1 tab under tongue, til chestpain is gone, up to 3 doses, if no relief get help 2)  Vicodin 5-500 Mg Tabs (Hydrocodone-acetaminophen) .Marland Kitchen.. 1 three times a day as needed for pain 3)  Rapaflo 8 Mg Caps (Silodosin) .... Take 1 by mouth once daily 4)  Amitriptyline Hcl 25 Mg Tabs (Amitriptyline hcl) .Marland Kitchen.. 1 at bedtime 5)  Altace 5 Mg Caps (Ramipril) .... Take one by mouth daily 6)  Toprol Xl 50 Mg Tb24 (Metoprolol succinate) .... Take 1/2 tab by mouth daily 7)  Adult Aspirin Low Strength 81 Mg Tbdp (Aspirin) .... Take one by mouth daily 8)  Levothyroxine Sodium 25 Mcg Tabs (Levothyroxine sodium) .... Take 1 by mouth once daily 9)  Doxycycline Hyclate 100 Mg Caps (Doxycycline hyclate) .Marland Kitchen.. 1 tab by mouth two times a day for bronchitis 10)  Proair Hfa 108 (90 Base) Mcg/act Aers (Albuterol sulfate) .... Take 2 puffs every 4 hours as needed for shortness of breath 11)  Cardizem Cd 180 Mg Xr24h-cap (Diltiazem hcl coated beads) .... Take 1 by mouth two times a day 12)  Prednisone 20 Mg Tabs (Prednisone) .... 60 mg for 2 days, 50 mg for 2 days, 40 mg for 2days, 30 mg for 2 days, 20 mg for 2 days, 10 mg for 2 days and then     stopping. 13)  Spiriva Handihaler 18 Mcg Caps (Tiotropium bromide monohydrate) ....  Inhaled daily 14)  Coumadin 5 Mg Tabs (Warfarin sodium) .... As directed  Patient Instructions: 1)  Please get protime here today 2)  Keep July 17th appt   Orders Added: 1)  Est. Patient Level IV [99214]    Prior Medications: NITROSTAT 0.4 MG SUBL (NITROGLYCERIN) place 1 tab under tongue, til chestpain is gone, up to 3 doses, if no relief get help VICODIN 5-500 MG TABS (HYDROCODONE-ACETAMINOPHEN) 1 three times a day as needed for pain RAPAFLO 8 MG CAPS (SILODOSIN) take 1 by mouth once daily AMITRIPTYLINE HCL 25 MG TABS (AMITRIPTYLINE HCL) 1 at bedtime ALTACE 5 MG CAPS (RAMIPRIL) Take one by mouth daily TOPROL XL 50 MG TB24 (METOPROLOL SUCCINATE) Take 1/2 tab by mouth daily ADULT ASPIRIN LOW STRENGTH 81 MG  TBDP (ASPIRIN) Take one by mouth daily LEVOTHYROXINE SODIUM 25 MCG TABS (LEVOTHYROXINE SODIUM) take 1 by mouth once daily DOXYCYCLINE HYCLATE 100 MG CAPS (DOXYCYCLINE HYCLATE) 1 tab by mouth two times a day for bronchitis PROAIR HFA 108 (90 BASE) MCG/ACT AERS (ALBUTEROL  SULFATE) take 2 puffs every 4 hours as needed for shortness of breath CARDIZEM CD 180 MG XR24H-CAP (DILTIAZEM HCL COATED BEADS) take 1 by mouth two times a day PREDNISONE 20 MG TABS (PREDNISONE) 60 mg for 2 days, 50 mg for 2 days, 40 mg for 2days, 30 mg for 2 days, 20 mg for 2 days, 10 mg for 2 days and then     stopping. SPIRIVA HANDIHALER 18 MCG CAPS (TIOTROPIUM BROMIDE MONOHYDRATE) inhaled daily COUMADIN 5 MG TABS (WARFARIN SODIUM) as directed Current Allergies: * VIOXX (ROFECOXIB) PENICILLIN G POTASSIUM (PENICILLIN G POTASSIUM) LIPITOR (ATORVASTATIN CALCIUM) VYTORIN (EZETIMIBE-SIMVASTATIN) * TRICOR  Appended Document: HOSP F/U AT [place]  Laboratory Results   Blood Tests   Date/Time Recieved: February 04, 2011 3:47 PM  Date/Time Reported: February 04, 2011 3:47 PM   PT: 21.0 s   (Normal Range: 10.6-13.4)  INR: 1.8   (Normal Range: 0.88-1.12   Therap INR: 2.0-3.5)      ANTICOAGULATION  RECORD  NEW REGIMEN & LAB RESULTS Anticoag. Dx: Atrial fibrillation Current INR Goal Range: 2.0-3.0 Current INR: 1.8 Regimen: 5mg  qd  Provider: Lacresia Darwish      Repeat testing in: 1 week MEDICATIONS NITROSTAT 0.4 MG SUBL (NITROGLYCERIN) place 1 tab under tongue, til chestpain is gone, up to 3 doses, if no relief get help VICODIN 5-500 MG TABS (HYDROCODONE-ACETAMINOPHEN) 1 three times a day as needed for pain RAPAFLO 8 MG CAPS (SILODOSIN) take 1 by mouth once daily AMITRIPTYLINE HCL 25 MG TABS (AMITRIPTYLINE HCL) 1 at bedtime ALTACE 5 MG CAPS (RAMIPRIL) Take one by mouth daily TOPROL XL 50 MG TB24 (METOPROLOL SUCCINATE) Take 1/2 tab by mouth daily ADULT ASPIRIN LOW STRENGTH 81 MG  TBDP (ASPIRIN) Take one by mouth daily LEVOTHYROXINE SODIUM 25 MCG TABS (LEVOTHYROXINE SODIUM) take 1 by mouth once daily DOXYCYCLINE HYCLATE 100 MG CAPS (DOXYCYCLINE HYCLATE) 1 tab by mouth two times a day for bronchitis PROAIR HFA 108 (90 BASE) MCG/ACT AERS (ALBUTEROL SULFATE) take 2 puffs every 4 hours as needed for shortness of breath CARDIZEM CD 180 MG XR24H-CAP (DILTIAZEM HCL COATED BEADS) take 1 by mouth two times a day PREDNISONE 20 MG TABS (PREDNISONE) 60 mg for 2 days, 50 mg for 2 days, 40 mg for 2days, 30 mg for 2 days, 20 mg for 2 days, 10 mg for 2 days and then     stopping. SPIRIVA HANDIHALER 18 MCG CAPS (TIOTROPIUM BROMIDE MONOHYDRATE) inhaled daily COUMADIN 5 MG TABS (WARFARIN SODIUM) as directed  Dose has been reviewed with patient or caretaker during this visit.  Reviewed by: Allison Quarry  Anticoagulation Visit Questionnaire      Coumadin dose missed/changed:  No      Abnormal Bleeding Symptoms:  No Any diet changes including alcohol intake, vegetables or greens since the last visit:  No Any illnesses or hospitalizations since the last visit:  No Any signs of clotting since the last visit (including chest discomfort, dizziness, shortness of breath, arm tingling, slurred speech, swelling or redness  in leg):  No

## 2011-02-18 ENCOUNTER — Other Ambulatory Visit: Payer: Self-pay

## 2011-02-19 NOTE — Medication Information (Signed)
Summary: protime/tw/ schedule next inr with Thyroid labs tw   Indication 1: Atrial fibrillation PT 28.2 INR RANGE 2.0-3.0           Allergies: 1)  * Vioxx (Rofecoxib) 2)  Penicillin G Potassium (Penicillin G Potassium) 3)  Lipitor (Atorvastatin Calcium) 4)  Vytorin (Ezetimibe-Simvastatin) 5)  * Tricor  Anticoagulation Management History:      Positive risk factors for bleeding include an age of 75 years or older.  The bleeding index is 'intermediate risk'.  Positive CHADS2 values include Age > 75 years old.  His last INR was 1.8 and today's INR is 2.3.  Prothrombin time is 28.2.    Anticoagulation Management Assessment/Plan:      The patient's current anticoagulation dose is Coumadin 5 mg tabs: as directed.  The next INR is due 4 weeks.        Laboratory Results   Blood Tests   Date/Time Recieved: February 11, 2011 9:27 AM  Date/Time Reported: February 11, 2011 9:27 AM   PT: 28.2 s   (Normal Range: 10.6-13.4)  INR: 2.3   (Normal Range: 0.88-1.12   Therap INR: 2.0-3.5)      ANTICOAGULATION RECORD PREVIOUS REGIMEN & LAB RESULTS Anticoagulation Diagnosis:  Atrial fibrillation on  02/04/2011 Previous INR Goal Range:  2.0-3.0 on  02/04/2011 Previous INR:  1.8 on  02/04/2011  Previous Regimen:  5mg  qd on  02/04/2011  NEW REGIMEN & LAB RESULTS Current INR: 2.3 Regimen: 5mg  qd  (no change)  Provider: letvak      Repeat testing in: 4 weeks MEDICATIONS NITROSTAT 0.4 MG SUBL (NITROGLYCERIN) place 1 tab under tongue, til chestpain is gone, up to 3 doses, if no relief get help VICODIN 5-500 MG TABS (HYDROCODONE-ACETAMINOPHEN) 1 three times a day as needed for pain RAPAFLO 8 MG CAPS (SILODOSIN) take 1 by mouth once daily AMITRIPTYLINE HCL 25 MG TABS (AMITRIPTYLINE HCL) 1 at bedtime ALTACE 5 MG CAPS (RAMIPRIL) Take one by mouth daily TOPROL XL 50 MG TB24 (METOPROLOL SUCCINATE) Take 1/2 tab by mouth daily ADULT ASPIRIN LOW STRENGTH 81 MG  TBDP (ASPIRIN) Take one by  mouth daily LEVOTHYROXINE SODIUM 25 MCG TABS (LEVOTHYROXINE SODIUM) take 1 by mouth once daily DOXYCYCLINE HYCLATE 100 MG CAPS (DOXYCYCLINE HYCLATE) 1 tab by mouth two times a day for bronchitis PROAIR HFA 108 (90 BASE) MCG/ACT AERS (ALBUTEROL SULFATE) take 2 puffs every 4 hours as needed for shortness of breath CARDIZEM CD 180 MG XR24H-CAP (DILTIAZEM HCL COATED BEADS) take 1 by mouth two times a day PREDNISONE 20 MG TABS (PREDNISONE) 60 mg for 2 days, 50 mg for 2 days, 40 mg for 2days, 30 mg for 2 days, 20 mg for 2 days, 10 mg for 2 days and then     stopping. SPIRIVA HANDIHALER 18 MCG CAPS (TIOTROPIUM BROMIDE MONOHYDRATE) inhaled daily COUMADIN 5 MG TABS (WARFARIN SODIUM) as directed  Dose has been reviewed with patient or caretaker during this visit.  Reviewed by: Allison Quarry  Anticoagulation Visit Questionnaire      Coumadin dose missed/changed:  No      Abnormal Bleeding Symptoms:  No Any diet changes including alcohol intake, vegetables or greens since the last visit:  No Any illnesses or hospitalizations since the last visit:  No Any signs of clotting since the last visit (including chest discomfort, dizziness, shortness of breath, arm tingling, slurred speech, swelling or redness in leg):  No

## 2011-02-19 NOTE — Progress Notes (Signed)
Summary: DOXYCYCLINE/ TRAMADOL  Phone Note Refill Request Message from:  [id number] on February 13, 2011 2:12 PM  Refills Requested: Medication #1:  DOXYCYCLINE HYCLATE 100 MG CAPS 1 tab by mouth two times a day for bronchitis   Last Refilled: 01/24/2011 [facility] Request also for Tramadol 50mg  last filled 01/23/2011, ok to refill?   Method Requested: Electronic Initial call taken by: Mervin Hack CMA Duncan Dull),  February 13, 2011 2:13 PM  Follow-up for Phone Call        should be done with the antibiotic---find out why they requested more okay to refill tramadol #30 x 0  three times a day as needed for cough Follow-up by: Cindee Salt MD,  February 13, 2011 2:18 PM  Additional Follow-up for Phone Call Additional follow up Details #1::        Rx faxed to pharmacy, spoke with patient and he was confused if he should continue the antibiotic. Pt understood but did want the tramadol. Additional Follow-up by: Mervin Hack CMA Duncan Dull),  February 13, 2011 5:12 PM    New/Updated Medications: TRAMADOL HCL 50 MG TABS (TRAMADOL HCL) three times a day as needed for cough Prescriptions: TRAMADOL HCL 50 MG TABS (TRAMADOL HCL) three times a day as needed for cough  #30 x 0   Entered by:   Mervin Hack CMA (AAMA)   Authorized by:   Cindee Salt MD   Signed by:   Mervin Hack CMA (AAMA) on 02/13/2011   Method used:   [facility]        [facility]  [address] [phone]* (retail)       [address]       [place], [place]  [address]       Ph: [phone]       Fax: [phone]   RxID:   [id number]

## 2011-02-25 ENCOUNTER — Encounter: Payer: Self-pay | Admitting: Family Medicine

## 2011-02-25 DIAGNOSIS — J441 Chronic obstructive pulmonary disease with (acute) exacerbation: Secondary | ICD-10-CM

## 2011-02-25 DIAGNOSIS — I2510 Atherosclerotic heart disease of native coronary artery without angina pectoris: Secondary | ICD-10-CM

## 2011-02-25 DIAGNOSIS — Z7901 Long term (current) use of anticoagulants: Secondary | ICD-10-CM

## 2011-02-25 DIAGNOSIS — I4891 Unspecified atrial fibrillation: Secondary | ICD-10-CM

## 2011-02-25 DIAGNOSIS — Z5181 Encounter for therapeutic drug level monitoring: Secondary | ICD-10-CM

## 2011-03-05 NOTE — Miscellaneous (Signed)
Summary: Certification and Plan of Care/[facility]  Certification and Plan of Care/[facility]   Imported By: Maryln Gottron 02/27/2011 15:37:14  _____________________________________________________________________  External Attachment:    Type:   Image     Comment:   External Document

## 2011-03-11 ENCOUNTER — Encounter: Payer: Self-pay | Admitting: Internal Medicine

## 2011-03-11 ENCOUNTER — Other Ambulatory Visit: Payer: Self-pay | Admitting: Internal Medicine

## 2011-03-11 ENCOUNTER — Other Ambulatory Visit (INDEPENDENT_AMBULATORY_CARE_PROVIDER_SITE_OTHER): Payer: Medicare Other

## 2011-03-11 DIAGNOSIS — Z5181 Encounter for therapeutic drug level monitoring: Secondary | ICD-10-CM

## 2011-03-11 DIAGNOSIS — E039 Hypothyroidism, unspecified: Secondary | ICD-10-CM

## 2011-03-11 DIAGNOSIS — Z7901 Long term (current) use of anticoagulants: Secondary | ICD-10-CM

## 2011-03-11 DIAGNOSIS — I4891 Unspecified atrial fibrillation: Secondary | ICD-10-CM

## 2011-03-11 LAB — TSH: TSH: 18.48 u[IU]/mL — ABNORMAL HIGH (ref 0.35–5.50)

## 2011-03-11 LAB — T4, FREE: Free T4: 0.73 ng/dL (ref 0.60–1.60)

## 2011-03-11 LAB — CONVERTED CEMR LAB
INR: 1
Prothrombin Time: 12.4 s

## 2011-03-12 ENCOUNTER — Other Ambulatory Visit: Payer: Self-pay | Admitting: Family Medicine

## 2011-03-13 ENCOUNTER — Other Ambulatory Visit: Payer: Self-pay | Admitting: Internal Medicine

## 2011-03-13 MED ORDER — HYDROCODONE-ACETAMINOPHEN 5-500 MG PO TABS
1.0000 | ORAL_TABLET | Freq: Three times a day (TID) | ORAL | Status: AC | PRN
Start: 2011-03-12 — End: 2011-03-22

## 2011-03-13 NOTE — Telephone Encounter (Signed)
Okay #90 x 0 

## 2011-03-13 NOTE — Telephone Encounter (Signed)
This is Dr. Karle Starch pt.  I will route to [facility].

## 2011-03-13 NOTE — Telephone Encounter (Signed)
rx called to pharmacy 

## 2011-03-18 ENCOUNTER — Other Ambulatory Visit (INDEPENDENT_AMBULATORY_CARE_PROVIDER_SITE_OTHER): Payer: Medicare Other

## 2011-03-18 ENCOUNTER — Ambulatory Visit (INDEPENDENT_AMBULATORY_CARE_PROVIDER_SITE_OTHER): Payer: Medicare Other | Admitting: Internal Medicine

## 2011-03-18 DIAGNOSIS — Z5181 Encounter for therapeutic drug level monitoring: Secondary | ICD-10-CM

## 2011-03-18 DIAGNOSIS — I4891 Unspecified atrial fibrillation: Secondary | ICD-10-CM

## 2011-03-18 DIAGNOSIS — Z7901 Long term (current) use of anticoagulants: Secondary | ICD-10-CM

## 2011-03-18 NOTE — Patient Instructions (Signed)
Return in two weeks, continue current dose.

## 2011-03-18 NOTE — Patient Instructions (Signed)
No change, 2 weeks

## 2011-03-21 ENCOUNTER — Encounter: Payer: Self-pay | Admitting: Internal Medicine

## 2011-03-22 ENCOUNTER — Ambulatory Visit (INDEPENDENT_AMBULATORY_CARE_PROVIDER_SITE_OTHER): Payer: Medicare Other | Admitting: Internal Medicine

## 2011-03-22 ENCOUNTER — Encounter: Payer: Self-pay | Admitting: Internal Medicine

## 2011-03-22 VITALS — BP 158/80 | HR 82 | Temp 98.4°F | Ht 65.0 in | Wt 129.0 lb

## 2011-03-22 DIAGNOSIS — R0989 Other specified symptoms and signs involving the circulatory and respiratory systems: Secondary | ICD-10-CM

## 2011-03-22 DIAGNOSIS — J4489 Other specified chronic obstructive pulmonary disease: Secondary | ICD-10-CM

## 2011-03-22 DIAGNOSIS — J449 Chronic obstructive pulmonary disease, unspecified: Secondary | ICD-10-CM

## 2011-03-22 DIAGNOSIS — R0609 Other forms of dyspnea: Secondary | ICD-10-CM

## 2011-03-22 DIAGNOSIS — I4891 Unspecified atrial fibrillation: Secondary | ICD-10-CM

## 2011-03-22 DIAGNOSIS — I2510 Atherosclerotic heart disease of native coronary artery without angina pectoris: Secondary | ICD-10-CM

## 2011-03-22 MED ORDER — TIOTROPIUM BROMIDE MONOHYDRATE 18 MCG IN CAPS: 18.0000 ug | ORAL_CAPSULE | Freq: Every day | RESPIRATORY_TRACT | Status: DC

## 2011-03-22 MED ORDER — ALBUTEROL SULFATE HFA 108 (90 BASE) MCG/ACT IN AERS: 2.0000 | INHALATION_SPRAY | Freq: Four times a day (QID) | RESPIRATORY_TRACT | Status: DC | PRN

## 2011-03-22 NOTE — Patient Instructions (Signed)
Please restart the spiriva and stay on it every day Only use the proair if you have shortness of breath that is bothersome

## 2011-03-22 NOTE — Progress Notes (Signed)
  Subjective:    Patient ID: [name], male    DOB: [birth year], 77 y.o.   MRN: [id number]  HPI Has had some trouble with his breathing when first getting up in the morning Feels bad and weak Legs seem to want to give out on him This started before recentl hospitalization but is worse now Dr Donnie Aho stopped the metoprolol after hospital stay Awakens 7-7:30 AM    No chest pain No palpitations No edema Sleeps flat-- no PND Nocturia x 1 ~5AM   Dyspnea continues for about 2 hours Leg weakness is better after pain pills Back to fairly normal activity level--will mow lawn (mostly riding and slightly with push mower---has to divide up the work)  Ran out of spiriva and proair Didn't get refills from hospital and never notified us  Past Medical History  Diagnosis Date  . Allergy   . CAD (coronary artery disease)   . Hyperlipidemia   . Kidney stones   . BPH (benign prostatic hypertrophy)   . Osteoarthritis   . COPD (chronic obstructive pulmonary disease)   . Hypothyroidism   . Atrial fibrillation     Past Surgical History  Procedure Date  . Inguinal hernia repair 09/2004    left  . Back surgery 1990's    x3  . Transurethral resection of prostate 2002  . Pleural scarification 1980    right  . Esophagogastroduodenoscopy 04/2002    negative  . Bladder stone removal 06/2006    Family History  Problem Relation Age of Onset  . Heart disease Mother   . Heart disease Brother   . Stroke Brother   . Cancer Neg Hx   . Diabetes Neg Hx   . Hypertension Neg Hx   . Heart disease Brother   . Stroke Brother   . Heart disease Brother   . Stroke Brother     History   Social History  . Marital Status: Married    Spouse Name: N/A    Number of Children: 1  . Years of Education: N/A   Occupational History  . retired Therapist, music    Social History Main Topics  . Smoking status: Former Smoker    Types: Cigarettes    Quit date: 12/23/2001  . Smokeless tobacco: Not on file    . Alcohol Use: No  . Drug Use: Not on file  . Sexually Active: Not on file   Other Topics Concern  . Not on file   Social History Narrative   Does lots of yard work and a big garden     Review of Systems No cough No fever Appetite is fine---down from in the past though    Objective:   Physical Exam  Constitutional: He appears well-developed and well-nourished. No distress.  Neck: Normal range of motion. Neck supple. No thyromegaly present.  Cardiovascular: Normal rate, regular rhythm and normal heart sounds.  Exam reveals no gallop.   No murmur heard.      occ skips  Pulmonary/Chest: Effort normal. No respiratory distress. He has wheezes. He has no rales.       Slight exp wheeze but not tight  Abdominal: Soft. He exhibits no mass. There is no tenderness.  Musculoskeletal: He exhibits no edema and no tenderness.  Lymphadenopathy:    He has no cervical adenopathy.  Psychiatric: He has a normal mood and affect. His behavior is normal. Judgment and thought content normal.          Assessment & Plan:

## 2011-03-28 ENCOUNTER — Telehealth: Payer: Self-pay | Admitting: *Deleted

## 2011-03-28 DIAGNOSIS — Z7901 Long term (current) use of anticoagulants: Secondary | ICD-10-CM

## 2011-03-28 DIAGNOSIS — I4891 Unspecified atrial fibrillation: Secondary | ICD-10-CM

## 2011-03-28 DIAGNOSIS — Z5181 Encounter for therapeutic drug level monitoring: Secondary | ICD-10-CM

## 2011-03-28 NOTE — Telephone Encounter (Signed)
Addended by: Adriana Simas on: 03/28/2011 01:21 PM   Modules accepted: Orders

## 2011-03-28 NOTE — Telephone Encounter (Signed)
INR monitoring enrollment  

## 2011-04-01 ENCOUNTER — Ambulatory Visit (INDEPENDENT_AMBULATORY_CARE_PROVIDER_SITE_OTHER): Payer: Medicare Other | Admitting: Internal Medicine

## 2011-04-01 DIAGNOSIS — Z7901 Long term (current) use of anticoagulants: Secondary | ICD-10-CM

## 2011-04-01 DIAGNOSIS — I4891 Unspecified atrial fibrillation: Secondary | ICD-10-CM

## 2011-04-01 DIAGNOSIS — Z5181 Encounter for therapeutic drug level monitoring: Secondary | ICD-10-CM

## 2011-04-10 ENCOUNTER — Other Ambulatory Visit: Payer: Self-pay | Admitting: Internal Medicine

## 2011-04-24 ENCOUNTER — Other Ambulatory Visit: Payer: Self-pay | Admitting: *Deleted

## 2011-04-24 MED ORDER — HYDROCODONE-ACETAMINOPHEN 5-500 MG PO TABS: ORAL_TABLET | ORAL | Status: DC

## 2011-04-24 NOTE — Telephone Encounter (Signed)
Fax is on your desk . 

## 2011-04-24 NOTE — Telephone Encounter (Signed)
Okay #90 x 0 

## 2011-04-24 NOTE — Telephone Encounter (Signed)
rx faxed to pharmacy manually  

## 2011-04-29 ENCOUNTER — Ambulatory Visit (INDEPENDENT_AMBULATORY_CARE_PROVIDER_SITE_OTHER): Payer: Medicare Other | Admitting: Internal Medicine

## 2011-04-29 DIAGNOSIS — Z7901 Long term (current) use of anticoagulants: Secondary | ICD-10-CM

## 2011-04-29 DIAGNOSIS — I4891 Unspecified atrial fibrillation: Secondary | ICD-10-CM

## 2011-04-29 DIAGNOSIS — Z5181 Encounter for therapeutic drug level monitoring: Secondary | ICD-10-CM

## 2011-04-29 NOTE — Patient Instructions (Signed)
Continue current dose, check in 4 weeks  

## 2011-05-07 NOTE — Cardiovascular Report (Signed)
[name], [name]               ACCOUNT NO.:  [id number]   MEDICAL RECORD NO.:  [id number]          PATIENT TYPE:  OIB   LOCATION:  1962                         FACILITY:  [facility]   PHYSICIAN:  Georga Hacking, M.D.DATE OF BIRTH:  1928-10-13   DATE OF PROCEDURE:  05/05/2008  DATE OF DISCHARGE:  05/05/2008                            CARDIAC CATHETERIZATION   HISTORY:  This is a 77 year old male who has a history of coronary  artery disease with previous stenting of the circumflex coronary artery  in March 2003.  He has significant lumbar laminectomy in the past.  He  was seen for routine examination about 3 weeks ago, but has complained  of significant exertional dyspnea he attributes to pollen.  He has  continued to complain of significant exertional dyspnea and fatigue  since then.  He was treated with Claritin, but developed dyspnea with  any level of activity as well as vague chest tightness.  He was brought  to the catheterization laboratory since the Cardiolite was unremarkable  one year ago.   PROCEDURE:  Left heart catheterization with coronary angiograms and left  ventriculogram.   COMMENTS ABOUT PROCEDURE:  The right femoral artery was entered and 4-  French catheter was placed under percutaneous technique.  Catheters used  to select the coronaries were 4-French and a standard right coronary  artery was needed to select the right coronary artery which had a  somewhat inferior takeoff.  A 30 mL ventriculogram was performed.  The  sheath was removed and held with manual pressure.   HEMODYNAMIC DATA:  Aorta post contrast 107/48, LV post contrast 107/815.   ANGIOGRAPHIC DATA:  Left ventriculogram:  Performed in the 30-degree RAO  projection.  The aortic valve is normal.  The mitral valve is normal.  The left ventricle appears normal in size.  There appeared to be  increased trabeculations consistent with left ventricular hypertrophy.  This is particularly so at the apex.   Coronary arteries arise and  distribute normally.  There is calcification noted involving the  proximal right coronary, mid right coronary, circumflex, and LAD.  Left  main coronary artery is normal.  Left anterior descending:  Calcified proximally.  There is mild-to-  moderate irregularity noted in the vessel.  There is a calcified  segmental mid vessel stenosis at around 50%.  The coronaries are  somewhat small.  An intermediate branch arises and contains a 50-60%  midvessel stenosis which is unchanged from previous.  The circumflex  coronary artery gives off a large atrial branch and then has a first  marginal branch and has a patent stent in its proximal portion.  There  is 30% in-stent restenosis noted.  Right coronary artery:  Dominant vessel with a calcified eccentric 60-  70% midvessel stenosis.  In some views, it could be as much as 80 and  other views around 50%.  There is calcification noted in the distal  vessel with mild irregularity.   IMPRESSION:  1. Coronary artery disease with a patent stent with minimal in-stent      restenosis involving the circumflex marginal branch.  2. Mild to moderate calcific disease involving the left anterior      descending.  3. Moderately severe stenosis involving the mid right coronary artery.   RECOMMENDATIONS:  We will get an echocardiogram to assess left  ventricular wall thickness in view of the increased trabeculations  noted.  Repeat stress testing to be sure that the right coronary artery  is not ischemic.  He had normal perfusion a year ago.      Georga Hacking, M.D.  Electronically Signed     WST/MEDQ  D:  05/05/2008  T:  05/05/2008  Job:  [id number]   cc:   Karie Schwalbe, MD

## 2011-05-07 NOTE — Discharge Summary (Signed)
[name], [name]               ACCOUNT NO.:  [id number]   MEDICAL RECORD NO.:  [id number]          PATIENT TYPE:  INP   LOCATION:  4736                         FACILITY:  [facility]   PHYSICIAN:  Valerie A. Felicity Coyer, MDDATE OF BIRTH:  Mar 18, 1928   DATE OF ADMISSION:  11/16/2008  DATE OF DISCHARGE:  11/18/2008                               DISCHARGE SUMMARY   DISCHARGE DIAGNOSIS:  1. Chest pressure and tightness, multifactorial, negative rule out for      myocardial infarction.  2. Atrial fibrillation with rapid ventricular response, now rate      controlled with return to normal sinus, no evidence of ischemia.  3. Chronic obstructive pulmonary disease with acute exacerbation,      continue empiric antibiotics, prednisone taper, and Atrovent.  4. History of coronary artery disease status post myocardial      infarction in 2003.  Last cardiac catheterization done May 2009.  5. Ongoing tobacco abuse.  Recommend the patient to quit, reviewed      with the patient.   DISPOSITION:  The patient is discharged home in medically stable and  improved condition.  He is instructed to contact his primary care  physician Dr. Tillman Abide for appointment next week, also follow up  with cardiologist Dr. Donnie Aho, to call the office to arrange.   DISCHARGE MEDICATIONS:  1. Discontinuation of Levaquin as the patient believes this      contributed to his symptoms.  2. Doxycycline 100 mg b.i.d. x7 additional days.  3. Aspirin 325 mg daily.  4. Altace 5 mg p.o. daily.  5. Toprol-XL 50 mg daily.  Previously, the patient reports taking only      1/2 tablet daily, now to take whole tablet for total of 50 mg      daily.  6. ProAir inhaler 2 puffs q.4-6 h. p.r.n. shortness of breath.  7. Prednisone taper 30 mg daily x3 days, then 20 mg daily x3 days,      then 10 mg daily x3 days, then stop.   DISPOSITION:  Discharged home in medically stable and improved  condition.   HOSPITAL COURSE BY PROBLEM:  1.  Chest pressure with rapid AFib and COPD exacerbation.  The patient      is a pleasant 77 year old gentleman with COPD, ongoing tobacco      abuse as well as history of coronary artery disease who presented      to the emergency room evening of admission due to increasing chest      pain and tightness.  He was found to be in rapid Afib on his EKG      which was new and called by Cardiology to the emergency room for      evaluation.  Cardiology felt that due to the lack of ischemic EKG      changes and recent nature of being treated for COPD exacerbation      with Levaquin inhalers, but this was more likely symptomatic      problems due to underlying COPD and thus the patient was admitted      by hospital service.  Despite his underlying COPD, the patient's      beta-blocker dose was increased from 25 mg daily to 50 mg daily and      the patient has done well with this.  He was also discontinued on      his previous Levaquin and switched to doxycycline for underlying      possible infectious etiology of his COPD exacerbation with these      changes.  The patient is return to normal sinus which has been rate      controlled.  Cardiology felt there was no other issue for acute      inpatient hospitalization and the patient is stable for discharge      home to continue his medical management as ongoing.  His chest pain      has resolved and again telemetry was negative for any other      arrhythmia and serial enzymes were negative for ischemia.  The      patient is to continue his treatment of COPD exacerbation, his      antibiotics, prednisone taper, and MDI as needed for shortness of      breath symptoms and follow up with his primary care physician,      cessation of smoking has been discussed and emphasized with the      patient stressing the implications to current disease exacerbation      with the patient who agrees he will consider quitting.  Other      medical issues are as previously  listed.  Please see H and P for      further details.  Greater than 30 minutes was spent in discharge      planning.      Valerie A. Felicity Coyer, MD  Electronically Signed     VAL/MEDQ  D:  11/18/2008  T:  11/18/2008  Job:  [id number]

## 2011-05-07 NOTE — Consult Note (Signed)
[name], [name]               ACCOUNT NO.:  [id number]   MEDICAL RECORD NO.:  [id number]          PATIENT TYPE:  INP   LOCATION:  4736                         FACILITY:  [facility]   PHYSICIAN:  Vesta Mixer, M.D. DATE OF BIRTH:  04/20/1928   DATE OF CONSULTATION:  DATE OF DISCHARGE:                                 CONSULTATION   [name] is a 77 year old gentleman with a history of coronary  artery disease, COPD.  He presents to the emergency room with episodes  of chest tightness for the past several weeks.   Mr. Boze has had progressive shortness of breath and generalized  fatigue for the past several months.  His family states that he just  gives out sooner than what he ordinarily used to.  He has been having  lots of shortness of breath with minimal exertion doing things that he  used to do a little easily.  He hurts all over and takes narcotic pain  medicines fairly regularly.   He has been having more chest pain recently and this past Sunday went to  [facility] and was diagnosed as having pneumonia.  He was  started on Levaquin but really did not get any better.  He presented to  his medical doctor who thought that he had gotten better.  This morning,  he awoke with severe worsening of his chest pain.  He really describes  the pain as a global pain involving shoulders, abdomen, and chest.  He  had a pleuritic component to it.  The tightness clearly worsened with  the deep breath.  He called EMS and was brought to the emergency room.  He has not gotten a whole lot better here in the emergency room although  he is not in any acute distress.   He denies any syncope or presyncope.  He denies any PND or orthopnea.  He has had a cough productive of yellow sputum for the past several  days.  He denies any fever.  He denies any rash or skin nodules.  He  denies any diaphoresis.   CURRENT MEDICATIONS:  (Exact medications unknown).  1. Levaquin 750 mg a  day.  2. Pro-Air once a day.  3. Metoprolol presumably 25 mg a day.  4. Aspirin once a day.   He does not know his other medications.   PAST MEDICAL HISTORY:  1. Coronary artery disease.  He is status post PTCA and stenting of      his left circumflex artery.  Heart catheterization in May 2009      revealed moderate coronary artery disease.  2. COPD.  3. Hypertension.   SOCIAL HISTORY:  The patient used to be a heavy smoker and still smokes  some.   His family history is positive for cardiac disease.   His review of systems is as noted in the HPI.  He denies any fever.  He  has had a cough.  He denies any rash or skin nodules.  He denies any  problems with his vision.  He denies any problems with his hearing.  He  is chronically short of breath.  He has had some chest tightness as  noted above.  He denies any angina pain.  He denies any nausea although  the Levaquin seems to cause an upset stomach.  He denies any problems  with his GI or GU.  All of his other systems were reviewed and are  negative.   PHYSICAL EXAMINATION:  GENERAL:  He is an elderly gentleman in no acute  distress.  VITAL SIGNS:  His blood pressure is 118/66 with a heart rate of 74.  HEENT:  Sclerae are nonicteric.  His oropharynx is moist.  He has no JVD  and no thyromegaly.  NECK:  His carotids are 2+.  LUNGS:  Few basilar rales but is otherwise clear.  HEART:  Regular rate, S1, S2.  ABDOMEN:  Good bowel sounds and is nontender.  There is no  hepatosplenomegaly.  EXTREMITIES:  He has no clubbing, cyanosis or edema.  He has no rash.  His gait was not assessed.  GU:  Not examined.  NEURO:  Cranial nerves II-XII intact and his motor and sensory function  are intact.   LABORATORY DATA:  Sodium is 136, potassium is 4.2, chloride is 110, CO2  is 21, creatinine is 1.0, glucose is 121.  White blood cell count is  5.6, hematocrit is 42.8, hemoglobin is 14.2.  Chest x-ray reveals  hyperinflated lung fields.   He has right upper lobe density.  CT of the  chest reveals right upper lobe scarring which appears to be chronic.  There is no pulmonary embolus.  His cardiac enzymes are negative x1.   IMPRESSION AND PLAN:  1. Chest tightness.  I gave a history of really more pleuritic chest      pain than angina.  I have reviewed the cath films and he has      moderate coronary artery disease but nothing critical as of May      2009.  His initial cardiac enzymes are negative.  I think that he      would best be served on an Internal Medicine Service.  We will      certainly continue to follow in consult.  2. Atrial fibrillation.  The patient did have a transient episode of      atrial fibrillation called on the EKG.  He is not known to have a      history of atrial fibrillation and he had already converted by the      time I saw him.  He will need to be assessed to see if he is a      Coumadin candidate.  I think an echocardiogram has already been      performed by Dr. Donnie Aho.  3. Chronic obstructive pulmonary disease.  The patient will need a      full assessment of his chronic obstructive pulmonary disease.  He      is really on minimal chronic obstructive pulmonary disease      medicines and certainly could be tuned up from that standpoint.      Vesta Mixer, M.D.  Electronically Signed     PJN/MEDQ  D:  11/16/2008  T:  11/17/2008  Job:  [id number]   cc:   [facility]  Lacretia Nicks. Viann Fish, M.D.

## 2011-05-07 NOTE — H&P (Signed)
NAMECHAMBERLAIN, [name]               ACCOUNT NO.:  [id number]   MEDICAL RECORD NO.:  [id number]          PATIENT TYPE:  EMS   LOCATION:  MAJO                         FACILITY:  [facility]   PHYSICIAN:  Michiel Cowboy, MDDATE OF BIRTH:  [birth year]   DATE OF ADMISSION:  11/16/2008  DATE OF DISCHARGE:                              HISTORY & PHYSICAL   PRIMARY CARE Natonya Finstad:  Dr. Pearlie Oyster. [facility] [place].   CHIEF COMPLAINT:  Chest pressure and shortness of breath.   HISTORY AND PHYSICAL:  The patient is a 77 year old gentleman with a  history of coronary artery disease, followed by Dr. Donnie Aho and COPD.  The patient was at his baseline up until about Sunday when [facility] and was diagnosed with what they thought was  pneumonia, although not sure if a chest x-ray was obtained.  They  started him on Levaquin and ProAir.  He shortly thereafter, he began  feeling poorly, stated he started getting nauseous, was not able to eat  anything for 3 days.  He has been having a cough productive of green  sputum for some time now, unable to state exactly for how long, although  denies any wheezing or fevers.  Today, he developed pains and aches all  over and stated that they went to his chest.  When he woke up this  morning, he had chest pressure, nonradiating, nonexertional, but also  associated with shortness of breath.  The shortness of breath is  somewhat worse when he takes a deep breath.  He states that he sometimes  gets some shortness of breath with exercise, but overall able to get  around.  This particular episode of shortness of breath and chest  pressure went away after he presented to the emergency department and  received aspirin and nitroglycerin.  He did not receive any nebulizer  treatments.  Currently, he states that he feels back to his baseline.   The patient feels that all of this is secondary to Levaquin.  He cannot  recollect the chest pain he had  with angina that he had in the past.  States although he had a remote history of myocardial function.  He is  not sure if that felt similar or different to what he feels now.   Of note, cardiology was initially called at this admission, but felt  that this more likely was COPD rather that cardiac in nature, although  they did agree with cycling cardiac enzymes and will follow with  medicine.  Of note, also the patient developed transient atrial  fibrillation while in the emergency room which currently resolved.   REVIEW OF SYSTEMS:  No fevers, no chills.  Positive for nausea, but no  vomiting and no diarrhea.  No abdominal pain.  Otherwise review of  systems negative.   PAST MEDICAL HISTORY:  1. History of three back surgeries.  2. History of TURP.  3. History of coronary artery disease, status post MI in 2003, with      recent catheterization done May 2009, showing a moderate amount of  disease.  4. The patient is status post angioplasty and stenting in 2003.  5. History of COPD with ongoing tobacco abuse.   SOCIAL HISTORY:  The patient continues to smoke one or two cigarettes  every other day or so.  He lives at home.  Does not drink, does not  abuse drugs.   FAMILY HISTORY:  Noncontributory.   ALLERGIES:  HE STATES THAT HE IS ACTUALLY ALLERGIC TO ALL KINDS OF  STATINS, BUT IN H AND P IN 2003, ALSO STATES THAT HE ALSO HAS ALLERGIES  TO PENICILLIN AND VIOXX, ALTHOUGH CURRENTLY DOES NOT CONFIRM THAT.   MEDICATIONS:  The patient is unsure which medicines he takes, but thinks  that it is metoprolol, Altace and aspirin 81 mg daily, as well as he was  recently put on ProAir and Levaquin.  Per review of records, the patient  used to take Altace 5 mg daily and Toprol XL 50 mg daily, but there was  in 2003.   PHYSICAL EXAMINATION:  VITAL SIGNS:  Temperature 97.5, blood pressure  145/__________, pulse 81, respirations 20.  Satting 97% on room air.  GENERAL:  The patient appears  to be a thin male in no acute distress.  HEENT:  Head nontraumatic.  Leathery skin.  Somewhat dry mucous  membranes and decreased skin turgor.  LUNGS:  Coarse breath sounds bilaterally with minimal wheezing noted  bilaterally.  HEART:  No murmur appreciated, currently a regular rate and rhythm.  ABDOMEN:  Soft, nontender, nondistended.  LOWER EXTREMITIES:  Without clubbing, cyanosis or edema.  NEUROLOGIC:  Intact.  Strength 5/5 in all four extremities.   LABORATORY DATA:  White blood cell count 5.6, hemoglobin 14.2, sodium  136, potassium 4.2, creatinine 1.0.  First set of I-Stat markers are  negative.  CT scan of the chest showing COPD, but no PE, no pneumonia.  EKG showing initially atrial fibrillation with rapid ventricular  response up to 122 with slight ST depressions in lead III, V4 and  perhaps lead II.  A repeat EKG done a few hours later showed normal  sinus rhythm with resolution of those EKG changes.   ASSESSMENT AND PLAN:  This is a 77 year old gentleman with history of  coronary artery disease and chronic obstructive pulmonary disease who  presents with chest pressure and shortness of breath.  Somewhat atypical  story, unclear what the etiology of current symptoms are.  Given  extensive history of coronary artery disease will admit and cycle  cardiac enzymes.  Will check fasting lipid panel, hemoglobin A1c, check  TSH.  Perhaps his symptoms were related to a the atrial fibrillation  currently resolved.  Maybe he has a paroxysmal atrial fibrillation, but  never had a diagnosis of this in the past.  Perhaps this is secondary to  his chronic obstructive pulmonary disease and extensive lung disease.   1. Atrial fibrillation.  Will make sure the patient has 2-D echo      ordered and check TSH.  Continue metoprolol withholding parameters.      Defer to cardiology in regards to if they think he needs to be      anticoagulated or not.  At this point, atrial fibrillation has       resolved.  Will admit to telemetry.  2. Chronic obstructive pulmonary disease.  There is minimal wheezing,      and unclear if this is maybe a chronic obstructive pulmonary      disease exacerbation.  Given increased cough, it is likely.  Will      cover with doxycycline.  The patient had a bad reaction to Levaquin      with nausea and perhaps allergic to penicillins.  Will also make      sure he is on Xopenex and Atrovent nebulizers given some wheezing.      Will start on prednisone taper and monitor.  Will do a 6-minute      walk.  Encourage the patient to quit smoking.  The patient will      probably need PFTs once his symptoms resolve or improve.  Will      obtain a baseline and determine the degree of chronic obstructive      pulmonary disease that he has.  3. Chronic pain, continue Norco.  4. Elevated blood pressure.  Not mentioned, but I think the patient      likely has history of hypertension.  Will continue Altace with      holding parameters and metoprolol.  5. Clinical dehydration, probably secondary to poor p.o. intake and      nausea.  Will give IV fluids and follow orthostatics.  6. For prophylaxis, Protonix plus Lovenox.      Michiel Cowboy, MD  Electronically Signed     AVD/MEDQ  D:  11/16/2008  T:  11/16/2008  Job:  [id number]   cc:   Karie Schwalbe, MD  Patsy Lager, MD

## 2011-05-10 NOTE — Op Note (Signed)
[facility]  Patient:    [name], [name]                      MRN: [id number] Proc. Date: 04/02/01 Adm. Date:  [id number] Attending:  Evlyn Holland CC:         [name], M.D.   Operative Report  PROCEDURE:  Transurethral resection of the prostate.  PREOPERATIVE DIAGNOSIS:  Benign prostatic hypertrophy with outlet obstruction, recurrent urinary tract infections.  POSTOPERATIVE DIAGNOSIS:  Benign prostatic hypertrophy with outlet obstruction, recurrent urinary tract infections.  SURGEON:  [name]. [name], M.D.  ANESTHESIA:  General.  SPECIMENS:  Prostate tissue.  DRAINS:  75 French three way Foley catheter.  COMPLICATIONS:  None.  INDICATIONS:  [name] is a 77 year old white male, who has a history of recurrent UTIs, frequency q 30 minutes, nocturia q 30 minutes, and office evaluation revealed reduced flow rate of 10 cc per second with some intermittency of the flow.  After discussing treatment options, he elected TURP.  FINDINGS IN PROCEDURE:  The patient was taken to the operating room after receiving p.o. Tequin.  Thigh TED hose were fitted, and general anesthetic was induced.  He was placed in lithotomy position.  His perineum and genitalia were prepped with Betadine solution.  He was draped in the usual sterile fashion.  Cystoscopy was performed using a 22 [place] scope and the 12 and 70 degree lens.  This examination revealed a normal urethra.  The external sphincter was intact.  The prostatic urethra was short with lateral lobe enlargement, coaptation, obstruction.  Examination of bladder revealed mild trabeculation.  The bladder wall mucosa was pale.  Previously noted erythematous area on the posterior wall was no longer visible, and I believe that was inflammation related to his infection.  However, at the bladder neck, there wee a couple of inflammatory-appearing cysts.  The ureteral orifices were in their normal  anatomic position effluxing clear urine.  The cystoscope was then removed, and the urethra was calibrated to 30 [place] with [employer] sounds.  A 28 French continuous-flow resectoscope sheath was then inserted. This was fitted with an Wandra Scot handle that had been fitted with a 24 [place] loop and 12 degree lens.  The prostatic resection was then performed, beginning at the bladder neck where the bladder neck fibers were exposed from 5 to 7 oclock.  The floor of the prostate was then resected out to alongside the verumontanum.  The right lobe of the prostate was resected from bladder neck to apex.  The left lobe was resected from bladder neck to apex.  During the resection, multiple prostatic calculi were unroofed and washed into the bladder.  The bladder was then evacuated free of chips and stones.  Some residual apical and anterior tissue was resected, and the chips were removed. Hemostasis was achieved.  Examination after resection revealed intact ureteral orifices and intact external sphincter.  The scope was removed.  Pressure on the bladder produced an excellent stream.  A 22 French three way Foley catheter was then inserted with the aid of a catheter guide.  The balloon was filled with 30 cc of sterile fluid.  The catheter was hand irrigated until clear then connected to continuous irrigation and straight drainage.  The patient was taken down from lithotomy position.  His anesthetic was reversed. He was moved to the recovery room in stable condition having no complications. DD:  04/02/01 TD:  04/02/01 Job: [id number]

## 2011-05-10 NOTE — Discharge Summary (Signed)
[place]. [facility]  Patient:    [name], [name] Visit Number: [id number] MRN: [id number]          Service Type: MED Location: [phone] Attending Physician:  Norman Clay Dictated by:   Darden Palmer., M.D. Admit Date:  03/15/2002 Discharge Date: 03/17/2002   CC:         Juluis Mire, M.D.   Discharge Summary  FINAL DIAGNOSES: 1. Acute coronary syndrome with subendocardial infarction.    a. Status post angioplasty and stenting of a subtotal stenosis involving       the circumflex coronary artery.    b. Residual disease involving the right coronary artery and diagonal       branches. 2. Combined hyperlipidemia not previously treated. 3. Cigarette abuse.  PROCEDURES:  Cardiac catheterization and angioplasty and stenting.  HISTORY:  A 77 year old male, previously healthy until he had the onset of severe anterior chest pain with radiation to his arms while doing yard work. He had ST depression in the emergency room that resolved.  A CT scan was unremarkable on admission.  HOSPITAL COURSE:  The patient was brought into the hospital to rule out a myocardial infarction. He was placed on heparin and IV nitroglycerin in the emergency room.  He did have some mild recurrent chest pain while in the hospital and was placed on Integrilin.  His lab data on admission showed a hemoglobin of 15.9, hematocrit 45.3. PT and PTT were normal.  Initial CPK and troponin were normal but rose to 247 with an MB of 30 and troponin of 3.18.  Sodium was 133, glucose 114, BUN 21, creatinine 1.  Lipid panel showed a cholesterol of 245, triglycerides 216, LDL 152, HDL 50.  The patient was stabilized overnight on Integrilin, heparin, nitroglycerin, beta blockers and aspirin.  He was taken to the catheterization laboratory the next morning, where catheterization showed normal left ventricular function. He had calcification of the left coronary  system with mild to moderate 40% LAD stenosis. Diagonal branches were diffusely diseased in a moderate way.  There was culprit severe 99% stenosis involving the first marginal branch of the circumflex coronary artery that was stented with a 3.0 x 13 mm Zeta stent.  He was continued on Integrilin following that procedure.  Post procedure CPK was normal.  He was ambulatory in the hall and had no recurrence of symptoms. Instruction was given for smoking cessation.  He was seen by cardiac rehab. CONDITION ON DISCHARGE:  He was discharged in improved condition.  DISCHARGE MEDICATIONS: 1. Aspirin 81 mg q.d. 2. Plavix 75 mg q.d. 3. Toprol XL 50 mg q.d. 4. Lipitor 10 mg q.d. 5. Altace 5 mg q.d. 6. Foltx q.d. 7. Nitroglycerin p.r.n.  SPECIAL INSTRUCTIONS:  The patient is instructed in the terms of activity restrictions for the next week and was instructed not to smoke. He is to follow a low fat diet.  FOLLOW-UP:  He is to be seen in the office in one week for follow up.Dictated by:   Darden Palmer., M.D. Attending Physician:  Norman Clay DD:  03/17/02 TD:  03/18/02 Job: [id number]

## 2011-05-10 NOTE — Cardiovascular Report (Signed)
[place]. [facility]  Patient:    [name], [name] Visit Number: [id number] MRN: [id number]          Service Type: MED Location: [phone] Attending Physician:  Norman Clay Dictated by:   Darden Palmer., M.D. Proc. Date: 03/16/02 Admit Date:  03/15/2002   CC:         Juluis Mire, M.D.   Cardiac Catheterization  HISTORY:  A 77 year old male who presented with unstable angina and had a non-Q-wave infarction with elevated CPK and troponin.  He had ST depression that resolved.  COMMENTS ABOUT PROCEDURE:  The patient was brought to the catheterization lab. He was on Integrilin, IV nitroglycerin, and heparin.  He was prepped and draped in the usual manner and after Xylocaine anesthesia, a #6 French sheath was placed in the right femoral artery percutaneously.  Angiograms were made using #6 French catheters and a 30-cc ventriculogram was performed.  A second IV was begun and the sheath was exchanged over a wire for a #7 [place] sheath. Heparin, 3000 units, was administered achieving an ACT of 216.  Angioplasty equipment used was a #7 [place] JL-4 guiding catheter, HTF-J guidewire.  The lesion was directly stented with a 3.0 x 13-mm Zeta stent dilating it to 13 atmospheres with reproduction of symptoms during balloon inflation.  This yielded an excellent angiographic result.  Post dilatation angiograms were then made and the patient was returned to the holding area in stable condition.  He complained of moderate headache prior to the start of the case which was attributed to nitroglycerin.  HEMODYNAMIC DATA: 1. Aorta post contrast 114/58. 2. LV post contrast 114/12.  ANGIOGRAPHIC DATA:  LEFT VENTRICULOGRAM:  Performed in the 30-degree RAO projection.  The aortic valve is normal.  The mitral valve is normal.  The left ventricle appears normal in size.  The estimated ejection fraction is 60-65%.  CORONARY ARTERIOGRAPHY:   Coronary arteries arise and distribute normally. There is moderate calcification noted in the proximal left anterior descending as well as the circumflex. 1. Left main coronary artery:  Normal.  2. Left anterior descending:  Calcified proximally with moderate to moderately    severe diffuse disease in the proximal LAD.  The first diagonal branch is    diffusely diseased and is of somewhat small caliber.  The intermedius    branch arises and has a mid vessel 60-70% stenosis.  This vessel is    moderate in size.  3. The circumflex is calcified proximally.  There is moderate tortuosity    proximally.  The first marginal branch has a severe eccentric segmental    99% stenosis.  It is a large vessel.  4. The right coronary artery has a segmental 60-70% proximal and mid vessel    stenosis and is a dominant vessel.  Post dilatation angiograms of the circumflex marginal branch reveal 0% stenosis with no dissection and preservation of all branches.  IMPRESSION: 1. Successful stenting of a severe stenosis involving the first marginal    branch of the circumflex coronary artery. 2. Residual atherosclerotic disease involving the proximal and continuation    branch of the circumflex, the proximal left anterior descending artery,    the first diagonal branch, intermedius, and right coronary artery which    is moderate in severity. 3. Normal left ventricular function.Dictated by:   Darden Palmer., M.D.  Attending Physician:  Norman Clay DD:  03/16/02 TD:  03/16/02 Job: [id number]  [id number]

## 2011-05-10 NOTE — Op Note (Signed)
NAMESOLOMAN, MCKEITHAN               ACCOUNT NO.:  [id number]   MEDICAL RECORD NO.:  [id number]          PATIENT TYPE:  OIB   LOCATION:  NA                           FACILITY:  [facility]   PHYSICIAN:  Abigail Miyamoto, M.D. DATE OF BIRTH:  07/23/1928   DATE OF PROCEDURE:  10/02/2004  DATE OF DISCHARGE:                                 OPERATIVE REPORT   PREOPERATIVE DIAGNOSIS:  Left inguinal hernia.   POSTOPERATIVE DIAGNOSIS:  Left inguinal hernia.   OPERATION PERFORMED:  Left inguinal hernia repair with mesh.   SURGEON:  Douglas A. Magnus Ivan, M.D.   ANESTHESIA:  LMA and 0.25% Marcaine   ESTIMATED BLOOD LOSS:  Minimal.   DESCRIPTION OF PROCEDURE:  The patient was brought to the operating room and  identified Lazarus Gowda.  He was placed supine on the operating table and  anesthesia was induced.  The left abdomen and groin were then prepped and  draped in the usual sterile fashion.  An ilioinguinal nerve block was then  performed with the 0.5% Marcaine.  Next, a longitudinal incision was made in  the left groin with a #10 scalpel.  Incision was carried down through  Scarpa's fascia with the electrocautery.  The external oblique fascia was  then identified and opened with a scalpel and then farther with the  Metzenbaum scissors toward the external ring.  The testicular cord and  structures were easily identified.  The patient was found to have an  indirect inguinal hernia as well as a small direct inguinal hernia.  The  indirect hernia sac was separated from the cord structures and identified  down toward its base which was then tied off with a silk suture.  The  preperitoneal space was then dissected out with finger dissection bluntly.  Next, a large piece of Prolene mesh from the [employer] Prolene hernia system  was brought onto the field.  The underlay portion was placed through the  internal ring and opened onto the preperitoneal space.  The overlay portion  was then opened up on the  inguinal floor and slit was cut was the mesh to  incorporate the cord structures.  The mesh was then sewn to the pubic  tubercle with a 2-0 Vicryl suture.  It was then sewn to the transversalis  fascia medially and shelving edge of the inguinal ligament laterally.  It  was then sewn around the cord structures as well and then medially to the  transversalis fascia one more time.  Excellent coverage of the inguinal  floor appeared to be achieved.  The wound was then irrigated with normal  saline.  The external oblique fascia was then closed over this with running  2-0 Vicryl suture.  Scarpa's fascia closed with interrupted 3-0 Vicryl  sutures and the skin was closed with running 4-0 Monocryl.  Steri-Strips,  gauze and tape were then applied.  The patient tolerated the procedure well.  All sponge, needle and instrument counts were correct at the end of the  procedure.  The patient was then extubated in the operating room and taken  in stable condition to the  recovery room.     DB/MEDQ  D:  10/02/2004  T:  10/02/2004  Job:  [id number]

## 2011-05-10 NOTE — Discharge Summary (Signed)
[facility]  Patient:    [name], [name]                      MRN: [id number] Adm. Date:  [id number] Disc. Date: [id number] Attending:  Tobey Bride CC:         Juluis Mire, M.D.   Discharge Summary  HISTORY OF PRESENT ILLNESS:  Briefly, [name] is a 77 year old white male with a history of recurrent UTIs who was found to have BPH with outlet obstruction and is to undergo a TURP.  For additional details of the history and physical, see the office note included in the chart.  ACCESSORY CLINICAL INFORMATION:  Hemoglobin 13.9, BUN 24, creatinine 1.2, glucose 86.  EKG:  Normal sinus rhythm with normal EKG.  Chest x-ray:  Asymmetric right apical pleural parenchymal densities, right greater than left.  Comparison with old films or chest CT were indicated to exclude cancer.  CT of the chest revealed biapical pleural thickening with adjacent emphysema.  Findings suggest scarring.  HOSPITAL COURSE:  On the day of admission, the patient was taken to the operating room where he underwent TURP.  He was left with a 22 French three-way Foley catheter on irrigation to straight drainage.  He tolerated the procedure well.  On the first postoperative day, his urine was clear.  His CBI and IV were discontinued.  The chest CT scan, the report of which has been mentioned, was performed that day.  On the following day, his Foley catheter was removed and he was discharged home when voiding.  His discharge instructions were given.  He was instructed to follow up with me in two to three weeks.  He was discharged home with prescriptions for Vicodin and Bactrim.  FINAL DIAGNOSIS:  Benign prostatic hypertrophy.  COMPLICATIONS:  There were no complications during his admission.  DISPOSITION:  Home.  CONDITION:  Improved. DD:  04/16/01 TD:  04/16/01 Job: [id number]

## 2011-05-10 NOTE — H&P (Signed)
[place]. [facility]  Patient:    [name], [name] Visit Number: [id number] MRN: [id number]          Service Type: MED Location: [phone] Attending Physician:  Norman Clay Dictated by:   Darden Palmer., M.D. Admit Date:  03/15/2002   CC:         Juluis Mire, M.D.   History and Physical  CHIEF COMPLAINT: This is a 77 year old male, admitted to rule out myocardial infarction.  HISTORY OF PRESENT ILLNESS: He has previously been healthy except for cigarette smoking and arthritis.  He was on Celebrex but quit taking this one week ago.  He went out to do yard work today and had the onset of severe anterior chest pain with radiation down both of his arms, lasting around one hour and associated with mild sweating.  He presented to [facility] Emergency Room, where he had ST depression noted in V2 and V3.  He had a CT scan of the chest done showing no dissection and was given nitroglycerin with relief of his symptoms.  He had very minimal left arm pain with normalization of EKG since then.  He is admitted at this time to rule out myocardial infarction.  No previous pain of this nature.  PAST MEDICAL HISTORY: Negative for hypertension, diabetes, high cholesterol. Does have some mild stomach trouble in the past.  PAST SURGICAL HISTORY:  1. He has had three back surgeries.  2. TURP.  3. Previous pneumothorax.  ALLERGIES:  1. PENICILLIN.  2. VIOXX.  CURRENT MEDICATIONS: None.  FAMILY HISTORY: Mother died of an MI at age 12.  Father died of old age at age 44.  SOCIAL HISTORY: He is retired from [employer].  He quit drinking in 1955.  He smokes around one pack of cigarettes per day.  Has been married 47 years. Attends [employer].  REVIEW OF SYSTEMS: He has significant arthritis involving his neck and shoulders, for which he would previously take Celebrex.  He has no constipation, no  genitourinary symptoms.  Does have some mild pain in his legs when he walks.  No edema.  Does not have any eye, ear, nose, or throat problems.  No skin problems.  Other than as noted above, the remainder of the Review Of Systems is unremarkable.  PHYSICAL EXAMINATION:  GENERAL: He is an elderly, pleasant male who is thin and appears in no acute distress.  VITAL SIGNS: Blood pressure 143/80, pulse 70.  SKIN: Warm and dry.  HEENT/NECK: No JVD, thyromegaly, carotid bruits.  Funduscopic was normal. Lymph nodes were normal.  LUNGS: Clear to A&P.  CARDIAC: Normal S1 and S2.  No S3, S4, or murmur.  ABDOMEN: Soft, nontender.  No hepatosplenomegaly or mass.  EXTREMITIES: Femoral pulses 3+, posterior tibials 2+, dorsalis pedis difficult to feel.  There is no edema noted.  NEUROLOGIC: Normal.  LABORATORY DATA: Initial 12 lead ECG shows ST depression in V2 and V3.  Repeat EKG is normal.  Chest x-ray was normal.  Laboratories are pending at the time of this dictation.  IMPRESSION:  1. Prolonged episode of chest pain suggestive of myocardial ischemia.  Rule     out unstable angina or myocardial infarction.  2. Cigarette abuse.  3. Osteoarthritis.  PLAN:  1. Obtain lipid panel.  2. Rule out MI with serial enzymes.  3. Begin aspirin, IV heparin, nitroglycerin, and beta-blockers.  4. Likely will need catheterization to evaluate.  The  procedure was     discussed with the patient fully including risks, and he is willing     to proceed. Dictated by:   Darden Palmer., M.D. Attending Physician:  Norman Clay DD:  03/15/02 TD:  03/16/02 Job: [id number]

## 2011-05-10 NOTE — Procedures (Signed)
[facility]  Patient:    [name], [name] Visit Number: [id number] MRN: [id number]          Service Type: END Location: ENDO Attending Physician:  Dennison Bulla Ii Dictated by:   Verlin Grills, M.D. Proc. Date: 04/23/02 Admit Date:  04/23/2002 Discharge Date: 04/23/2002   CC:         Darden Palmer., M.D.  James L. Randa Evens, M.D.  [place] [place] [facility], [facility]   Procedure Report  PROCEDURE:  Esophagogastroduodenoscopy and colonoscopy.  PROCEDURE INDICATION:  [name] is a 77 year old male, born 08-Nov-1928.  [name] suffered an acute subendocardial infarction in March 2003, resulting in angioplasty and stenting of a subtotal stenosis involving the circumflex artery.  [name] is taking aspirin, Plavix, Toprol, Lipitor, Altace, and nitroglycerin.  He is passing melenic-appearing stool.  Approximately six years ago, a colonoscopy was performed by Dr. Carman Ching.  I do not have that colonoscopy report.  I discussed with [name] the complications associated with esophagogastroduodenoscopy and colonoscopy including a 15 per 1000 risk of bleeding and a 4 per 1000 risk of colon perforation requiring surgical repair. [name] has signed the operative permit.  MEDICATION ALLERGIES:  PENICILLIN, VIOXX.  PAST MEDICAL HISTORY: 1. Three remote back surgeries. 2. TURP. 3. Pneumothorax. 4. Coronary artery disease with subendocardial infarction. 5. Hyperlipidemia.  ENDOSCOPIST:  Verlin Grills, M.D.  PREMEDICATION:  Demerol 50 mg, Versed 7.5 mg.  ENDOSCOPE:  Olympus gastroscope and pediatric colonoscope.  PROCEDURE:  Esophagogastroduodenoscopy.  After obtaining informed consent,  [name] was placed in the left lateral decubitus position.  I administered intravenous Demerol and intravenous Versed to achieve conscious sedation for the procedure.  The patients blood pressure, oxygen  saturation, and cardiac rhythm were monitored throughout the procedure and documented in the medical record.  The Olympus gastroscope was passed through the posterior hypopharynx into the proximal esophagus without difficulty.  The hypopharynx, larynx, and vocal cords appeared normal.  Esophagoscopy:  The proximal, mid, and lower segments of the esophagus appeared completely normal.  Gastroscopy:  Retroflexed view of the gastric cardia and fundus was normal. The gastric body, antrum, and pylorus appeared completely normal.  Duodenoscopy:  The duodenal bulb, mid duodenum, and distal duodenum appeared normal endoscopically.  ASSESSMENT:  Normal esophagogastroduodenoscopy.  No signs of upper gastrointestinal bleeding.  PROCEDURE:  Proctocolonoscopy to the cecum.  Anal inspection normal.  Digital rectal exam normal.  I could not palpate the prostate.  The Olympus pediatric video colonoscope was introduced into the rectum and easily advanced to the cecum.  Colonic preparation for the exam today was excellent.  RECTUM:  Normal.  SIGMOID COLON AND DESCENDING COLON:  Normal.  SPLENIC FLEXURE:  Normal.  TRANSVERSE COLON:  Normal.  HEPATIC FLEXURE:  Normal.  ASCENDING COLON:  Normal.  CECUM AND ILEOCECAL VALVE:  Normal.  ASSESSMENT:  Normal proctocolonoscopy to the cecum.  No endoscopic evidence for the presence of bleeding from the lower intestine and no evidence for the presence of colorectal neoplasia or inflammatory bowel disease. Dictated by:   Verlin Grills, M.D. Attending Physician:  Dennison Bulla Ii DD:  04/23/02 TD:  04/24/02 Job: [id number]

## 2011-05-10 NOTE — Op Note (Signed)
NAMEDHANI, DANNEMILLER               ACCOUNT NO.:  [id number]   MEDICAL RECORD NO.:  [id number]          PATIENT TYPE:  AMB   LOCATION:  [facility]                         FACILITY:  [facility]   PHYSICIAN:  Excell Seltzer. Annabell Howells, M.D.    DATE OF BIRTH:  [birth year]   DATE OF PROCEDURE:  07/03/2006  DATE OF DISCHARGE:                                 OPERATIVE REPORT   PROCEDURE:  Right ureteroscopic stone extraction.   PREOPERATIVE DIAGNOSIS:  Right ureterovesical junction stone.   POSTOPERATIVE DIAGNOSIS:  Right ureterovesical junction stone.   SURGEON:  Dr. Bjorn Pippin.   ANESTHESIA:  General.   SPECIMEN:  Stone.   BLOOD LOSS:  Minimal.   COMPLICATIONS:  None.   INDICATIONS:  Mr. Pina is a 77 year old white male with a right distal  ureteral stone and irritative voiding symptoms.  He has elected  ureteroscopic stone extraction.   FINDINGS AND PROCEDURE:  He was given Cipro, was taken to the operating room  where general anesthetic was induced.  He was placed in lithotomy position.  His perineum and genitalia were prepped with Betadine solution.  He was  draped in the usual sterile fashion.  Cystoscopy was performed using a 22-  [place] scope and 12 and 70 degrees lenses.  Examination reveals a normal  urethra.  The external sphincter was intact.  The prostatic urethra was  patent without significant obstruction.  There did appear to be an old false  passage on the right anterior aspect of the prostatic urethra.  Examination  of bladder revealed mild trabeculation.  No tumors or stones were seen and  ureteral orifices were unremarkable.   The right ureteral orifice was cannulated with a guidewire and a 12-French  introducer sheath dilator was then passed over the wire to the mid ureter.   The 6-French short ureteroscope was then inserted alongside the wire.  The  stone was visualized, grasped with nitinol basket and removed.  The ureter  was then reinspected to above the iliacs, no residual  stone material or  ureteral injury was identified.   The guidewire was removed as I felt a stent was not indicated.  The  cystoscope was reinserted and the bladder was drained.  The patient was  given 30 mg Toradol IM to reduce ureteral spasm and he was taken down from  lithotomy position.  His anesthetic was reversed.  He was removed to the  recovery room in stable condition.  The stone was given to the family.  We  will have him return it for analysis at a later date.  There were no  complications.      Excell Seltzer. Annabell Howells, M.D.  Electronically Signed     JJW/MEDQ  D:  07/03/2006  T:  07/03/2006  Job:  [id number]   cc:   Varney Baas, MD

## 2011-05-10 NOTE — Consult Note (Signed)
NAMERUSS, LOOPER               ACCOUNT NO.:  [id number]   MEDICAL RECORD NO.:  [id number]          PATIENT TYPE:  INP   LOCATION:  4736                         FACILITY:  [facility]   PHYSICIAN:  Vesta Mixer, M.D. DATE OF BIRTH:  May 16, 1928   DATE OF CONSULTATION:  DATE OF DISCHARGE:  11/18/2008                                 CONSULTATION   ADDENDUM   Mr. Shirer is a 77 year old gentleman with a history of coronary artery  disease and COPD.  He presents to the emergency room with episodes of  chest tightness and shortness or breath.  We were asked to see the  patient in consultation by the emergency room doctors.  The patient's  primary cardiologist is Dr. Viann Fish.  We had discussion with the  [place] internist who will be admitting the patient.      Vesta Mixer, M.D.  Electronically Signed     PJN/MEDQ  D:  11/25/2008  T:  11/26/2008  Job:  [id number]

## 2011-05-27 ENCOUNTER — Ambulatory Visit (INDEPENDENT_AMBULATORY_CARE_PROVIDER_SITE_OTHER): Payer: Medicare Other | Admitting: Internal Medicine

## 2011-05-27 DIAGNOSIS — Z5181 Encounter for therapeutic drug level monitoring: Secondary | ICD-10-CM

## 2011-05-27 DIAGNOSIS — I4891 Unspecified atrial fibrillation: Secondary | ICD-10-CM

## 2011-05-27 DIAGNOSIS — Z7901 Long term (current) use of anticoagulants: Secondary | ICD-10-CM

## 2011-05-27 NOTE — Patient Instructions (Signed)
Continue current dose, check in 4 weeks  

## 2011-05-30 ENCOUNTER — Other Ambulatory Visit: Payer: Self-pay | Admitting: *Deleted

## 2011-05-30 MED ORDER — HYDROCODONE-ACETAMINOPHEN 5-500 MG PO TABS: ORAL_TABLET | ORAL | Status: DC

## 2011-05-30 NOTE — Telephone Encounter (Signed)
Okay #90 x 0 

## 2011-05-30 NOTE — Telephone Encounter (Signed)
rx faxed to pharmacy manually  

## 2011-05-30 NOTE — Telephone Encounter (Signed)
Fax is on your desk . 

## 2011-06-24 ENCOUNTER — Ambulatory Visit: Payer: Medicare Other

## 2011-06-24 ENCOUNTER — Ambulatory Visit (INDEPENDENT_AMBULATORY_CARE_PROVIDER_SITE_OTHER): Payer: Medicare Other | Admitting: Internal Medicine

## 2011-06-24 DIAGNOSIS — Z5181 Encounter for therapeutic drug level monitoring: Secondary | ICD-10-CM

## 2011-06-24 DIAGNOSIS — I4891 Unspecified atrial fibrillation: Secondary | ICD-10-CM

## 2011-06-24 DIAGNOSIS — Z7901 Long term (current) use of anticoagulants: Secondary | ICD-10-CM

## 2011-06-24 NOTE — Patient Instructions (Signed)
Continue 5 mg daily, recheck 4 weeks 

## 2011-06-27 ENCOUNTER — Other Ambulatory Visit: Payer: Self-pay | Admitting: Internal Medicine

## 2011-06-27 MED ORDER — ALBUTEROL SULFATE HFA 108 (90 BASE) MCG/ACT IN AERS: 2.0000 | INHALATION_SPRAY | RESPIRATORY_TRACT | Status: DC | PRN

## 2011-07-05 ENCOUNTER — Other Ambulatory Visit: Payer: Self-pay | Admitting: *Deleted

## 2011-07-05 MED ORDER — HYDROCODONE-ACETAMINOPHEN 5-500 MG PO TABS: ORAL_TABLET | ORAL | Status: DC

## 2011-07-05 NOTE — Telephone Encounter (Signed)
Faxed request from [facility] is on your desk.

## 2011-07-05 NOTE — Telephone Encounter (Signed)
rx faxed to pharmacy manually  

## 2011-07-05 NOTE — Telephone Encounter (Signed)
Okay #90 x 0 

## 2011-07-09 ENCOUNTER — Encounter: Payer: Self-pay | Admitting: Internal Medicine

## 2011-07-09 ENCOUNTER — Ambulatory Visit (INDEPENDENT_AMBULATORY_CARE_PROVIDER_SITE_OTHER): Payer: Medicare Other | Admitting: Internal Medicine

## 2011-07-09 VITALS — BP 110/60 | HR 69 | Temp 97.9°F | Ht 65.0 in | Wt 128.0 lb

## 2011-07-09 DIAGNOSIS — I4891 Unspecified atrial fibrillation: Secondary | ICD-10-CM

## 2011-07-09 DIAGNOSIS — J449 Chronic obstructive pulmonary disease, unspecified: Secondary | ICD-10-CM

## 2011-07-09 DIAGNOSIS — E039 Hypothyroidism, unspecified: Secondary | ICD-10-CM

## 2011-07-09 DIAGNOSIS — R7301 Impaired fasting glucose: Secondary | ICD-10-CM | POA: Insufficient documentation

## 2011-07-09 NOTE — Progress Notes (Signed)
Subjective:    Patient ID: [name], male    DOB: March 13, 1928, 77 y.o.   MRN: [id number]  HPI DOing okay Having ongoing back pain Has requested another epidural from Dr Marcellina Millin sent me letter requesting clearance to be off coumadin Gets relief from the hydrocodone as well--uses bid usually  No problems with heart No palpitations No chest pain  Breathing has been "pretty good" Difficult in AM--then better with AM meds Back on the spiriva  Voids okay Can be slow to initiate in AM Nocturia once only  Reviewed sugars He generally has been fasting for blood work  Current Outpatient Prescriptions on File Prior to Visit  Medication Sig Dispense Refill  . albuterol (PROVENTIL HFA;VENTOLIN HFA) 108 (90 BASE) MCG/ACT inhaler Inhale 2 puffs into the lungs every 4 (four) hours as needed.  8.5 g  1  . amitriptyline (ELAVIL) 25 MG tablet Take 1 by mouth daily      . aspirin 81 MG tablet Take 81 mg by mouth daily.        Marland Kitchen diltiazem (CARDIZEM CD) 180 MG 24 hr capsule Take 1 by mouth two times a day      . HYDROcodone-acetaminophen (VICODIN) 5-500 MG per tablet Take 1 tablet three times a day as needed for pain  90 tablet  0  . levothyroxine (SYNTHROID, LEVOTHROID) 25 MCG tablet Take 1 by mouth once a day      . NITROSTAT 0.4 MG SL tablet PLACE 1TAB UNDER TONGUE EVERY 5 MINS TIL CHEST PAIN IS GONE*UP TO 3DOSES NO RELEIF, GET HELP*  25 tablet  0  . ramipril (ALTACE) 5 MG capsule Take 1 by mouth daily      . RAPAFLO 8 MG CAPS Take 1 by mouth once daily      . tiotropium (SPIRIVA HANDIHALER) 18 MCG inhalation capsule Place 1 capsule (18 mcg total) into inhaler and inhale daily. Inhale once daily  30 capsule  11  . warfarin (COUMADIN) 5 MG tablet Take as directed        Allergies  Allergen Reactions  . Atorvastatin     REACTION: unspecified  . Ezetimibe-Simvastatin     REACTION: unspecified  . Fenofibrate     REACTION: "ran me up a wall"  . Penicillins     REACTION: unspecified     Past Medical History  Diagnosis Date  . Allergy   . CAD (coronary artery disease)   . Hyperlipidemia   . Kidney stones   . BPH (benign prostatic hypertrophy)   . Osteoarthritis   . COPD (chronic obstructive pulmonary disease)   . Hypothyroidism   . Atrial fibrillation     Past Surgical History  Procedure Date  . Inguinal hernia repair 09/2004    left  . Back surgery 1990's    x3  . Transurethral resection of prostate 2002  . Pleural scarification 1980    right  . Esophagogastroduodenoscopy 04/2002    negative  . Bladder stone removal 06/2006    Family History  Problem Relation Age of Onset  . Heart disease Mother   . Heart disease Brother   . Stroke Brother   . Cancer Neg Hx   . Diabetes Neg Hx   . Hypertension Neg Hx   . Heart disease Brother   . Stroke Brother   . Heart disease Brother   . Stroke Brother     History   Social History  . Marital Status: Married    Spouse Name: N/A  Number of Children: 1  . Years of Education: N/A   Occupational History  . retired Therapist, music    Social History Main Topics  . Smoking status: Former Smoker    Types: Cigarettes    Quit date: 12/23/2001  . Smokeless tobacco: Never Used  . Alcohol Use: No  . Drug Use: Not on file  . Sexually Active: Not on file   Other Topics Concern  . Not on file   Social History Narrative   Does lots of yard work and a big garden   Review of [employer] levels have been okay----does take it easy in the hot weather Appetite is fine Sleeps well Weight fairly stable Increasing arthritis pain in neck and shoulders as well    Objective:   Physical Exam  Constitutional: He appears well-developed and well-nourished. No distress.  Neck: Normal range of motion. Neck supple. No thyromegaly present.  Cardiovascular: Normal rate, regular rhythm and normal heart sounds.  Exam reveals no gallop.   No murmur heard. Pulmonary/Chest: Effort normal and breath sounds normal. No  respiratory distress. He has no wheezes. He has no rales.  Abdominal: Soft. He exhibits no mass. There is no tenderness.  Musculoskeletal: Normal range of motion. He exhibits no edema.  Lymphadenopathy:    He has no cervical adenopathy.  Psychiatric: He has a normal mood and affect. His behavior is normal. Judgment and thought content normal.          Assessment & Plan:

## 2011-07-09 NOTE — Assessment & Plan Note (Signed)
Persistent subclinicla hypothyroidism with elevated TSH Asymptomatic so I wouldn't increase the dose

## 2011-07-09 NOTE — Assessment & Plan Note (Signed)
Seems to have improved functional status back on the spiriva No changes

## 2011-07-09 NOTE — Assessment & Plan Note (Signed)
Persistent mildly elevated sugars Seems to eat a healthy diet, stays active, ideal body weight Will add A1c to blood work the next time

## 2011-07-09 NOTE — Assessment & Plan Note (Signed)
Asymptomatic Rate is fine and continues on coumadin  Risk is low to be off several days for epidural Would be fine to stop coumadin till protime normal (?5-6 days), proceed with epidural, and then restart coumadin. WOuld set up protime for about 5 days after restarting

## 2011-07-11 NOTE — Progress Notes (Signed)
Faxed note to Dr. Murray Hodgkins, per Dr. Alphonsus Sias..[name]

## 2011-07-22 ENCOUNTER — Ambulatory Visit (INDEPENDENT_AMBULATORY_CARE_PROVIDER_SITE_OTHER): Payer: Medicare Other | Admitting: Family Medicine

## 2011-07-22 DIAGNOSIS — Z5181 Encounter for therapeutic drug level monitoring: Secondary | ICD-10-CM

## 2011-07-22 DIAGNOSIS — Z7901 Long term (current) use of anticoagulants: Secondary | ICD-10-CM

## 2011-07-22 DIAGNOSIS — I4891 Unspecified atrial fibrillation: Secondary | ICD-10-CM

## 2011-07-22 LAB — POCT INR: INR: 1.2

## 2011-07-22 NOTE — Patient Instructions (Signed)
Pt has to hold 5 doses in order to get "shots in his back" on 07/25/11 , plans to restart coumadin after shots, unless he has to receive more than 1 treatment. I advised pt to call and let us know, so we can dose him/ recheck him accordingly.

## 2011-08-02 ENCOUNTER — Ambulatory Visit (INDEPENDENT_AMBULATORY_CARE_PROVIDER_SITE_OTHER): Payer: Medicare Other | Admitting: Internal Medicine

## 2011-08-02 DIAGNOSIS — Z7901 Long term (current) use of anticoagulants: Secondary | ICD-10-CM

## 2011-08-02 DIAGNOSIS — Z5181 Encounter for therapeutic drug level monitoring: Secondary | ICD-10-CM

## 2011-08-02 DIAGNOSIS — I4891 Unspecified atrial fibrillation: Secondary | ICD-10-CM

## 2011-08-02 LAB — POCT INR: INR: 1.7

## 2011-08-02 NOTE — Patient Instructions (Signed)
5 mg daily ,( take 7.5mg  today only)recheck 4 weeks. Has been back on Coumadin x 1 week

## 2011-08-12 ENCOUNTER — Other Ambulatory Visit: Payer: Self-pay | Admitting: *Deleted

## 2011-08-12 NOTE — Telephone Encounter (Signed)
Okay to fill #[phone] x 0 

## 2011-08-12 NOTE — Telephone Encounter (Signed)
Received faxed refill request from pharmacy. Is it okay to refill Hydrocodon-Acetaminophen? Form is in your in box.

## 2011-08-13 MED ORDER — HYDROCODONE-ACETAMINOPHEN 5-500 MG PO TABS: ORAL_TABLET | ORAL | Status: DC

## 2011-08-13 NOTE — Telephone Encounter (Signed)
rx faxed to pharmacy manually  

## 2011-08-30 ENCOUNTER — Ambulatory Visit (INDEPENDENT_AMBULATORY_CARE_PROVIDER_SITE_OTHER): Payer: Medicare Other | Admitting: Internal Medicine

## 2011-08-30 DIAGNOSIS — Z5181 Encounter for therapeutic drug level monitoring: Secondary | ICD-10-CM

## 2011-08-30 DIAGNOSIS — Z7901 Long term (current) use of anticoagulants: Secondary | ICD-10-CM

## 2011-08-30 DIAGNOSIS — I4891 Unspecified atrial fibrillation: Secondary | ICD-10-CM

## 2011-08-30 LAB — POCT INR: INR: 3

## 2011-08-30 NOTE — Patient Instructions (Signed)
Continue 5 mg daily, recheck 4 weeks 

## 2011-09-20 ENCOUNTER — Other Ambulatory Visit: Payer: Self-pay | Admitting: *Deleted

## 2011-09-20 MED ORDER — HYDROCODONE-ACETAMINOPHEN 5-500 MG PO TABS: ORAL_TABLET | ORAL | Status: DC

## 2011-09-20 MED ORDER — ALBUTEROL SULFATE HFA 108 (90 BASE) MCG/ACT IN AERS: 2.0000 | INHALATION_SPRAY | RESPIRATORY_TRACT | Status: DC | PRN

## 2011-09-20 NOTE — Telephone Encounter (Signed)
Form on your desk  

## 2011-09-20 NOTE — Telephone Encounter (Signed)
Okay #90 x 0 

## 2011-09-20 NOTE — Telephone Encounter (Signed)
rx faxed to pharmacy manually  

## 2011-09-24 LAB — CARDIAC PANEL(CRET KIN+CKTOT+MB+TROPI)
CK, MB: 1.8
Relative Index: INVALID
Relative Index: INVALID
Total CK: 49
Total CK: 57

## 2011-09-24 LAB — CK TOTAL AND CKMB (NOT AT ARMC)
CK, MB: 1.7
Total CK: 49

## 2011-09-24 LAB — URINE CULTURE

## 2011-09-24 LAB — BASIC METABOLIC PANEL
BUN: 16
CO2: 21
Calcium: 9.5
Creatinine, Ser: 1
Glucose, Bld: 121 — ABNORMAL HIGH

## 2011-09-24 LAB — BASIC METABOLIC PANEL WITH GFR
Chloride: 110
GFR calc Af Amer: >60
GFR calc non Af Amer: >60
Potassium: 4.2
Sodium: 136

## 2011-09-24 LAB — CBC
HCT: 42.8
Hemoglobin: 14.2
MCHC: 33.2
MCHC: 34.7
MCV: 93.7
Platelets: 252
RBC: 4.57
RDW: 13
RDW: 13.5
WBC: 5.6

## 2011-09-24 LAB — URINALYSIS, ROUTINE W REFLEX MICROSCOPIC
Bilirubin Urine: NEGATIVE
Nitrite: NEGATIVE
Specific Gravity, Urine: 1.021
pH: 6

## 2011-09-24 LAB — COMPREHENSIVE METABOLIC PANEL
ALT: 14
AST: 18
Calcium: 9.3
GFR calc Af Amer: >60
Sodium: 139
Total Protein: 6

## 2011-09-24 LAB — LIPID PANEL
Cholesterol: 158
LDL Cholesterol: 108 — ABNORMAL HIGH
VLDL: 16

## 2011-09-24 LAB — DIFFERENTIAL
Eosinophils Absolute: 0.1
Eosinophils Relative: 1
Lymphs Abs: 1.1
Monocytes Relative: 13 — ABNORMAL HIGH

## 2011-09-24 LAB — APTT
aPTT: 27
aPTT: 32

## 2011-09-24 LAB — TROPONIN I: Troponin I: <0.01

## 2011-09-24 LAB — POCT CARDIAC MARKERS
CKMB, poc: 1.8
Myoglobin, poc: 92.8
Troponin i, poc: <0.05

## 2011-09-24 LAB — PROTIME-INR
INR: 1
Prothrombin Time: 13.6

## 2011-09-24 LAB — TSH: TSH: 2.678

## 2011-09-24 LAB — HEMOGLOBIN A1C: Mean Plasma Glucose: 117

## 2011-09-27 ENCOUNTER — Ambulatory Visit (INDEPENDENT_AMBULATORY_CARE_PROVIDER_SITE_OTHER): Payer: Medicare Other | Admitting: Family Medicine

## 2011-09-27 DIAGNOSIS — Z7901 Long term (current) use of anticoagulants: Secondary | ICD-10-CM

## 2011-09-27 DIAGNOSIS — I4891 Unspecified atrial fibrillation: Secondary | ICD-10-CM

## 2011-09-27 DIAGNOSIS — Z5181 Encounter for therapeutic drug level monitoring: Secondary | ICD-10-CM

## 2011-09-27 NOTE — Patient Instructions (Signed)
Continue current dose, check in 4 weeks  

## 2011-10-01 ENCOUNTER — Other Ambulatory Visit: Payer: Self-pay | Admitting: Internal Medicine

## 2011-10-25 ENCOUNTER — Ambulatory Visit (INDEPENDENT_AMBULATORY_CARE_PROVIDER_SITE_OTHER): Payer: Medicare Other | Admitting: Internal Medicine

## 2011-10-25 DIAGNOSIS — Z7901 Long term (current) use of anticoagulants: Secondary | ICD-10-CM

## 2011-10-25 DIAGNOSIS — Z5181 Encounter for therapeutic drug level monitoring: Secondary | ICD-10-CM

## 2011-10-25 DIAGNOSIS — I4891 Unspecified atrial fibrillation: Secondary | ICD-10-CM

## 2011-10-25 NOTE — Patient Instructions (Signed)
Continue current dose, check in 4 weeks  

## 2011-10-28 ENCOUNTER — Encounter: Payer: Self-pay | Admitting: Internal Medicine

## 2011-10-28 ENCOUNTER — Ambulatory Visit (INDEPENDENT_AMBULATORY_CARE_PROVIDER_SITE_OTHER): Payer: Medicare Other | Admitting: Internal Medicine

## 2011-10-28 VITALS — BP 110/54 | HR 82 | Temp 97.0°F | Ht 65.0 in | Wt 127.0 lb

## 2011-10-28 DIAGNOSIS — M25559 Pain in unspecified hip: Secondary | ICD-10-CM

## 2011-10-28 DIAGNOSIS — M25551 Pain in right hip: Secondary | ICD-10-CM | POA: Insufficient documentation

## 2011-10-28 DIAGNOSIS — L570 Actinic keratosis: Secondary | ICD-10-CM

## 2011-10-28 MED ORDER — HYDROCODONE-ACETAMINOPHEN 5-500 MG PO TABS: ORAL_TABLET | ORAL | Status: DC

## 2011-10-28 NOTE — Progress Notes (Signed)
Subjective:    Patient ID: [name], male    DOB: [birth year], 77 y.o.   MRN: [id number]  HPI Something going on with his hip Pain at night for last 3 nights Okay during day Some relief with heating pad  No injury Back pain seems controlled with the shots in past Still does all his household tasks  No radiation down leg Some ongoing weakness in both legs---no recent change No tingling Some numbness in foot which is not new  Hasn't tried the hydrocodone  Also has spots on both temples that are scaly and just not resolving  Current Outpatient Prescriptions on File Prior to Visit  Medication Sig Dispense Refill  . albuterol (PROVENTIL HFA;VENTOLIN HFA) 108 (90 BASE) MCG/ACT inhaler Inhale 2 puffs into the lungs every 4 (four) hours as needed.  8.5 g  1  . amitriptyline (ELAVIL) 25 MG tablet Take 1 by mouth daily      . aspirin 81 MG tablet Take 81 mg by mouth daily.        Marland Kitchen diltiazem (CARDIZEM CD) 180 MG 24 hr capsule Take 1 by mouth two times a day      . HYDROcodone-acetaminophen (VICODIN) 5-500 MG per tablet Take 1 tablet three times a day as needed for pain  90 tablet  0  . levothyroxine (SYNTHROID, LEVOTHROID) 25 MCG tablet Take 1 by mouth once a day      . NITROSTAT 0.4 MG SL tablet PLACE 1TAB UNDER TONGUE EVERY 5 MINS TIL CHEST PAIN IS GONE*UP TO 3DOSES NO RELEIF, GET HELP*  25 tablet  0  . ramipril (ALTACE) 5 MG capsule Take 1 by mouth daily      . RAPAFLO 8 MG CAPS Take 1 by mouth once daily      . tiotropium (SPIRIVA HANDIHALER) 18 MCG inhalation capsule Place 1 capsule (18 mcg total) into inhaler and inhale daily. Inhale once daily  30 capsule  11  . warfarin (COUMADIN) 5 MG tablet Take as directed        Allergies  Allergen Reactions  . Atorvastatin     REACTION: unspecified  . Ezetimibe-Simvastatin     REACTION: unspecified  . Fenofibrate     REACTION: "ran me up a wall"  . Penicillins     REACTION: unspecified    Past Medical History  Diagnosis Date    . Allergy   . CAD (coronary artery disease)   . Hyperlipidemia   . Kidney stones   . BPH (benign prostatic hypertrophy)   . Osteoarthritis   . COPD (chronic obstructive pulmonary disease)   . Hypothyroidism   . Atrial fibrillation   . Impaired fasting glucose     Past Surgical History  Procedure Date  . Inguinal hernia repair 09/2004    left  . Back surgery 1990's    x3  . Transurethral resection of prostate 2002  . Pleural scarification 1980    right  . Esophagogastroduodenoscopy 04/2002    negative  . Bladder stone removal 06/2006    Family History  Problem Relation Age of Onset  . Heart disease Mother   . Heart disease Brother   . Stroke Brother   . Cancer Neg Hx   . Diabetes Neg Hx   . Hypertension Neg Hx   . Heart disease Brother   . Stroke Brother   . Heart disease Brother   . Stroke Brother     History   Social History  . Marital Status: Married  Spouse Name: N/A    Number of Children: 1  . Years of Education: N/A   Occupational History  . retired Therapist, music    Social History Main Topics  . Smoking status: Former Smoker    Types: Cigarettes    Quit date: 12/23/2001  . Smokeless tobacco: Never Used  . Alcohol Use: No  . Drug Use: Not on file  . Sexually Active: Not on file   Other Topics Concern  . Not on file   Social History Narrative   Does lots of yard work and a big garden   Review of Systems Chronic constipation--no change No urinary control issues    Objective:   Physical Exam  Constitutional: He appears well-developed and well-nourished.  Musculoskeletal:       No back or hip tenderness Pain spot is just below anterior superior iliac crest Fairly normal ROM in hips  Neurological:       Gait is stable but with somewhat stiff back  Skin:       Actinics both temporal areas and left preauricular area          Assessment & Plan:

## 2011-10-28 NOTE — Assessment & Plan Note (Signed)
Additional lesions found on left temple and back of left ear Total 5 lesions treated Liquid nitrogen 40 seconds x 2 Tolerated well Discussed home care

## 2011-10-28 NOTE — Patient Instructions (Signed)
Please try sports cream or ice on your right hip. Call if the pain continues and we will check an x-ray

## 2011-10-28 NOTE — Assessment & Plan Note (Signed)
Pain area seems to be in bursa No tenderness now Exam not consistent with sig arthritis there  Will try heat since it helps  Can try ice also Hydrocodone If worsening pain, will check x-ray

## 2011-11-22 ENCOUNTER — Ambulatory Visit (INDEPENDENT_AMBULATORY_CARE_PROVIDER_SITE_OTHER): Payer: Medicare Other | Admitting: Internal Medicine

## 2011-11-22 DIAGNOSIS — Z5181 Encounter for therapeutic drug level monitoring: Secondary | ICD-10-CM

## 2011-11-22 DIAGNOSIS — I4891 Unspecified atrial fibrillation: Secondary | ICD-10-CM

## 2011-11-22 DIAGNOSIS — Z7901 Long term (current) use of anticoagulants: Secondary | ICD-10-CM

## 2011-11-22 LAB — POCT INR: INR: 2

## 2011-11-22 NOTE — Patient Instructions (Signed)
Continue current dose, check in 4 weeks  

## 2011-12-09 ENCOUNTER — Other Ambulatory Visit: Payer: Self-pay | Admitting: *Deleted

## 2011-12-09 MED ORDER — HYDROCODONE-ACETAMINOPHEN 5-500 MG PO TABS: ORAL_TABLET | ORAL | Status: DC

## 2011-12-09 NOTE — Telephone Encounter (Signed)
Okay #90 x 0 

## 2011-12-09 NOTE — Telephone Encounter (Signed)
Rx called in as directed.   

## 2011-12-09 NOTE — Telephone Encounter (Signed)
Last filled 10/28/11

## 2011-12-20 ENCOUNTER — Ambulatory Visit (INDEPENDENT_AMBULATORY_CARE_PROVIDER_SITE_OTHER): Payer: Medicare Other | Admitting: Internal Medicine

## 2011-12-20 DIAGNOSIS — Z5181 Encounter for therapeutic drug level monitoring: Secondary | ICD-10-CM

## 2011-12-20 DIAGNOSIS — Z7901 Long term (current) use of anticoagulants: Secondary | ICD-10-CM

## 2011-12-20 LAB — POCT INR: INR: 2.4

## 2011-12-20 NOTE — Patient Instructions (Signed)
Continue 5 mg daily recheck 4 weeks

## 2011-12-31 ENCOUNTER — Other Ambulatory Visit: Payer: Self-pay | Admitting: Internal Medicine

## 2012-01-10 ENCOUNTER — Ambulatory Visit: Payer: Medicare Other | Admitting: Internal Medicine

## 2012-01-17 ENCOUNTER — Ambulatory Visit (INDEPENDENT_AMBULATORY_CARE_PROVIDER_SITE_OTHER): Payer: Medicare Other | Admitting: Internal Medicine

## 2012-01-17 DIAGNOSIS — Z5181 Encounter for therapeutic drug level monitoring: Secondary | ICD-10-CM

## 2012-01-17 DIAGNOSIS — Z7901 Long term (current) use of anticoagulants: Secondary | ICD-10-CM

## 2012-01-17 DIAGNOSIS — I4891 Unspecified atrial fibrillation: Secondary | ICD-10-CM

## 2012-01-17 LAB — POCT INR: INR: 2.1

## 2012-01-17 NOTE — Patient Instructions (Signed)
Continue 5 mg daily recheck 4 weeks 

## 2012-01-21 ENCOUNTER — Encounter: Payer: Self-pay | Admitting: Internal Medicine

## 2012-01-21 ENCOUNTER — Ambulatory Visit (INDEPENDENT_AMBULATORY_CARE_PROVIDER_SITE_OTHER): Payer: Medicare Other | Admitting: Internal Medicine

## 2012-01-21 VITALS — BP 120/70 | Temp 98.2°F | Ht 65.0 in | Wt 128.0 lb

## 2012-01-21 DIAGNOSIS — I2510 Atherosclerotic heart disease of native coronary artery without angina pectoris: Secondary | ICD-10-CM

## 2012-01-21 DIAGNOSIS — Z23 Encounter for immunization: Secondary | ICD-10-CM

## 2012-01-21 DIAGNOSIS — E039 Hypothyroidism, unspecified: Secondary | ICD-10-CM

## 2012-01-21 DIAGNOSIS — I4891 Unspecified atrial fibrillation: Secondary | ICD-10-CM

## 2012-01-21 DIAGNOSIS — R7301 Impaired fasting glucose: Secondary | ICD-10-CM

## 2012-01-21 DIAGNOSIS — E785 Hyperlipidemia, unspecified: Secondary | ICD-10-CM

## 2012-01-21 DIAGNOSIS — M159 Polyosteoarthritis, unspecified: Secondary | ICD-10-CM

## 2012-01-21 DIAGNOSIS — J449 Chronic obstructive pulmonary disease, unspecified: Secondary | ICD-10-CM

## 2012-01-21 LAB — CBC WITH DIFFERENTIAL/PLATELET
Basophils Absolute: 0 10*3/uL (ref 0.0–0.1)
Basophils Relative: 0.6 % (ref 0.0–3.0)
Eosinophils Absolute: 0.1 10*3/uL (ref 0.0–0.7)
HCT: 42.8 % (ref 39.0–52.0)
Hemoglobin: 14.3 g/dL (ref 13.0–17.0)
Lymphs Abs: 1.4 10*3/uL (ref 0.7–4.0)
MCHC: 33.5 g/dL (ref 30.0–36.0)
Monocytes Relative: 9.5 % (ref 3.0–12.0)
Neutro Abs: 5 10*3/uL (ref 1.4–7.7)
RDW: 14 % (ref 11.5–14.6)

## 2012-01-21 LAB — HEPATIC FUNCTION PANEL
ALT: 18 U/L (ref 0–53)
Alkaline Phosphatase: 68 U/L (ref 39–117)
Bilirubin, Direct: 0 mg/dL (ref 0.0–0.3)
Total Bilirubin: 0.4 mg/dL (ref 0.3–1.2)

## 2012-01-21 LAB — BASIC METABOLIC PANEL
CO2: 25 mEq/L (ref 19–32)
Chloride: 104 mEq/L (ref 96–112)
Glucose, Bld: 91 mg/dL (ref 70–99)
Potassium: 4.5 mEq/L (ref 3.5–5.1)
Sodium: 138 mEq/L (ref 135–145)

## 2012-01-21 LAB — HEMOGLOBIN A1C: Hgb A1c MFr Bld: 5.9 % (ref 4.6–6.5)

## 2012-01-21 MED ORDER — HYDROCODONE-ACETAMINOPHEN 5-500 MG PO TABS: ORAL_TABLET | ORAL | Status: DC

## 2012-01-21 MED ORDER — ALBUTEROL SULFATE HFA 108 (90 BASE) MCG/ACT IN AERS: 2.0000 | INHALATION_SPRAY | Freq: Four times a day (QID) | RESPIRATORY_TRACT | Status: DC | PRN

## 2012-01-21 NOTE — Assessment & Plan Note (Signed)
mostly has some trouble in the morning Albuterol helps Still on spiriva

## 2012-01-21 NOTE — Assessment & Plan Note (Signed)
Intolerant of multiple meds Will check level again

## 2012-01-21 NOTE — Assessment & Plan Note (Signed)
Good rate control On coumadin No symptoms

## 2012-01-21 NOTE — Assessment & Plan Note (Signed)
Seems to be quiet  

## 2012-01-21 NOTE — Assessment & Plan Note (Signed)
Pain in hands, back, neck, etc Does okay with the hydrocodone up to tid

## 2012-01-21 NOTE — Progress Notes (Signed)
Subjective:    Patient ID: [name], male    DOB: [birth year], 78 y.o.   MRN: [id number]  HPI Doing fair Still having back pain---had 3 injections which helped some Now with neck and shoulder pains Some pain in legs at times also Hydrocodone 2-3 per day---this does help  No heart trouble No chest pain No palpitations  Does have some SOB in the morning Albuterol helps in the morning Does use the spiriva daily Still totally functionally independent Piddles in his wood shop, helps wife with household tasks  Current Outpatient Prescriptions on File Prior to Visit  Medication Sig Dispense Refill  . albuterol (PROVENTIL HFA;VENTOLIN HFA) 108 (90 BASE) MCG/ACT inhaler Inhale 2 puffs into the lungs every 4 (four) hours as needed.  8.5 g  1  . amitriptyline (ELAVIL) 25 MG tablet Take 1 by mouth daily      . aspirin 81 MG tablet Take 81 mg by mouth daily.        Marland Kitchen diltiazem (CARDIZEM CD) 180 MG 24 hr capsule Take 1 by mouth two times a day      . HYDROcodone-acetaminophen (VICODIN) 5-500 MG per tablet Take 1 tablet three times a day as needed for pain  90 tablet  0  . levothyroxine (SYNTHROID, LEVOTHROID) 25 MCG tablet TAKE 1 TABLET EVERY DAY  90 tablet  3  . NITROSTAT 0.4 MG SL tablet PLACE 1TAB UNDER TONGUE EVERY 5 MINS TIL CHEST PAIN IS GONE*UP TO 3DOSES NO RELEIF, GET HELP*  25 tablet  0  . ramipril (ALTACE) 5 MG capsule Take 1 by mouth daily      . RAPAFLO 8 MG CAPS Take 1 by mouth once daily      . tiotropium (SPIRIVA HANDIHALER) 18 MCG inhalation capsule Place 1 capsule (18 mcg total) into inhaler and inhale daily. Inhale once daily  30 capsule  11  . warfarin (COUMADIN) 5 MG tablet Take as directed        Allergies  Allergen Reactions  . Atorvastatin     REACTION: unspecified  . Ezetimibe-Simvastatin     REACTION: unspecified  . Fenofibrate     REACTION: "ran me up a wall"  . Penicillins     REACTION: unspecified    Past Medical History  Diagnosis Date  . Allergy     . CAD (coronary artery disease)   . Hyperlipidemia   . Kidney stones   . BPH (benign prostatic hypertrophy)   . Osteoarthritis   . COPD (chronic obstructive pulmonary disease)   . Hypothyroidism   . Atrial fibrillation   . Impaired fasting glucose     Past Surgical History  Procedure Date  . Inguinal hernia repair 09/2004    left  . Back surgery 1990's    x3  . Transurethral resection of prostate 2002  . Pleural scarification 1980    right  . Esophagogastroduodenoscopy 04/2002    negative  . Bladder stone removal 06/2006    Family History  Problem Relation Age of Onset  . Heart disease Mother   . Heart disease Brother   . Stroke Brother   . Cancer Neg Hx   . Diabetes Neg Hx   . Hypertension Neg Hx   . Heart disease Brother   . Stroke Brother   . Heart disease Brother   . Stroke Brother     History   Social History  . Marital Status: Married    Spouse Name: N/A    Number of Children:  1  . Years of Education: N/A   Occupational History  . retired Therapist, music    Social History Main Topics  . Smoking status: Former Smoker    Types: Cigarettes    Quit date: 12/23/2001  . Smokeless tobacco: Never Used  . Alcohol Use: No  . Drug Use: Not on file  . Sexually Active: Not on file   Other Topics Concern  . Not on file   Social History Narrative   Does lots of yard work and a big garden   Review of Systems Unable to tolerate multiple cholesterol meds Some sleep problems--awakens ~3AM and can't get back to sleep Nocturia usually once 1/night No regular daytime somnolence Appetite is okay    Objective:   Physical Exam  Constitutional: He appears well-developed and well-nourished. No distress.  Neck: Normal range of motion. Neck supple. No thyromegaly present.  Cardiovascular: Normal rate and normal heart sounds.  Exam reveals no gallop.   No murmur heard.      Irregular rhythm Faint pedal pulses  Pulmonary/Chest: Effort normal and breath sounds  normal. No respiratory distress. He has no wheezes. He has no rales.  Musculoskeletal: He exhibits no edema and no tenderness.  Lymphadenopathy:    He has no cervical adenopathy.  Psychiatric: He has a normal mood and affect. His behavior is normal. Judgment and thought content normal.          Assessment & Plan:

## 2012-02-14 ENCOUNTER — Ambulatory Visit (INDEPENDENT_AMBULATORY_CARE_PROVIDER_SITE_OTHER): Payer: Medicare Other | Admitting: Internal Medicine

## 2012-02-14 DIAGNOSIS — Z5181 Encounter for therapeutic drug level monitoring: Secondary | ICD-10-CM

## 2012-02-14 DIAGNOSIS — I4891 Unspecified atrial fibrillation: Secondary | ICD-10-CM

## 2012-02-14 DIAGNOSIS — Z7901 Long term (current) use of anticoagulants: Secondary | ICD-10-CM

## 2012-02-14 LAB — POCT INR: INR: 2

## 2012-02-14 NOTE — Patient Instructions (Signed)
Continue current dose, check in 4 weeks  

## 2012-03-02 ENCOUNTER — Other Ambulatory Visit: Payer: Self-pay | Admitting: *Deleted

## 2012-03-02 MED ORDER — HYDROCODONE-ACETAMINOPHEN 5-500 MG PO TABS: ORAL_TABLET | ORAL | Status: DC

## 2012-03-02 NOTE — Telephone Encounter (Signed)
plz phone in. 

## 2012-03-02 NOTE — Telephone Encounter (Signed)
LETVAK PATIENT, ok to refill? 

## 2012-03-03 NOTE — Telephone Encounter (Signed)
rx called to pharmacy 

## 2012-03-09 ENCOUNTER — Other Ambulatory Visit: Payer: Self-pay | Admitting: Internal Medicine

## 2012-03-10 ENCOUNTER — Encounter: Payer: Self-pay | Admitting: Internal Medicine

## 2012-03-10 ENCOUNTER — Ambulatory Visit (INDEPENDENT_AMBULATORY_CARE_PROVIDER_SITE_OTHER): Payer: Medicare Other | Admitting: Internal Medicine

## 2012-03-10 VITALS — BP 130/80 | HR 136 | Temp 97.7°F | Ht 65.0 in | Wt 130.0 lb

## 2012-03-10 DIAGNOSIS — R4182 Altered mental status, unspecified: Secondary | ICD-10-CM

## 2012-03-10 NOTE — Patient Instructions (Signed)
Please try off the hydrocodone for a few days Call if the abnormal sensation stays around in the mornings

## 2012-03-10 NOTE — Progress Notes (Signed)
Subjective:    Patient ID: [name], male    DOB: September 16, 1928, 78 y.o.   MRN: [id number]  HPI Concerned that his medications are causing him problems Awakens ~5am and seems to be "crazy in the head" Things on his mind---like a bad dream--and "I can't get it off" Usually clears within a few hours  Started about 4 mornings ago Wasn't quite as bad this morning No OTC meds  Hasn't used over 2 hydrocodone daily of late Only used one a day once also  Uses rapaflo in Am and albuterol once a day in AM Breathing has been stable  No current sig worries No depression  Current Outpatient Prescriptions on File Prior to Visit  Medication Sig Dispense Refill  . albuterol (PROVENTIL HFA) 108 (90 BASE) MCG/ACT inhaler Inhale 2 puffs into the lungs every 6 (six) hours as needed for wheezing.  1 Inhaler  2  . aspirin 81 MG tablet Take 81 mg by mouth daily.        Marland Kitchen diltiazem (CARDIZEM CD) 180 MG 24 hr capsule Take 1 by mouth two times a day      . HYDROcodone-acetaminophen (VICODIN) 5-500 MG per tablet Take 1 tablet three times a day as needed for pain  90 tablet  0  . levothyroxine (SYNTHROID, LEVOTHROID) 25 MCG tablet TAKE 1 TABLET EVERY DAY  90 tablet  3  . NITROSTAT 0.4 MG SL tablet PLACE 1TAB UNDER TONGUE EVERY 5 MINS TIL CHEST PAIN IS GONE*UP TO 3DOSES NO RELEIF, GET HELP*  25 tablet  0  . ramipril (ALTACE) 5 MG capsule Take 1 by mouth daily      . RAPAFLO 8 MG CAPS Take 1 by mouth once daily      . tiotropium (SPIRIVA HANDIHALER) 18 MCG inhalation capsule Place 1 capsule (18 mcg total) into inhaler and inhale daily. Inhale once daily  30 capsule  11  . warfarin (COUMADIN) 5 MG tablet USE AS DIRECTED  90 tablet  3    Allergies  Allergen Reactions  . Atorvastatin     REACTION: unspecified  . Ezetimibe-Simvastatin     REACTION: unspecified  . Fenofibrate     REACTION: "ran me up a wall"  . Penicillins     REACTION: unspecified    Past Medical History  Diagnosis Date  . Allergy    . CAD (coronary artery disease)   . Hyperlipidemia   . Kidney stones   . BPH (benign prostatic hypertrophy)   . Osteoarthritis   . COPD (chronic obstructive pulmonary disease)   . Hypothyroidism   . Atrial fibrillation   . Impaired fasting glucose     Past Surgical History  Procedure Date  . Inguinal hernia repair 09/2004    left  . Back surgery 1990's    x3  . Transurethral resection of prostate 2002  . Pleural scarification 1980    right  . Esophagogastroduodenoscopy 04/2002    negative  . Bladder stone removal 06/2006    Family History  Problem Relation Age of Onset  . Heart disease Mother   . Heart disease Brother   . Stroke Brother   . Cancer Neg Hx   . Diabetes Neg Hx   . Hypertension Neg Hx   . Heart disease Brother   . Stroke Brother   . Heart disease Brother   . Stroke Brother     History   Social History  . Marital Status: Married    Spouse Name: N/A  Number of Children: 1  . Years of Education: N/A   Occupational History  . retired Therapist, music    Social History Main Topics  . Smoking status: Former Smoker    Types: Cigarettes    Quit date: 12/23/2001  . Smokeless tobacco: Never Used  . Alcohol Use: No  . Drug Use: Not on file  . Sexually Active: Not on file   Other Topics Concern  . Not on file   Social History Narrative   Does lots of yard work and a big garden   Review of Systems No sig daytime somnolence Appetite is fine Some memory problems    Objective:   Physical Exam  Constitutional: He is oriented to person, place, and time. He appears well-developed and well-nourished. No distress.  Neck: Normal range of motion. Neck supple.  Cardiovascular: Normal rate and normal heart sounds.  Exam reveals no gallop.   No murmur heard.      irregular  Pulmonary/Chest: Effort normal and breath sounds normal. No respiratory distress. He has no wheezes. He has no rales.  Lymphadenopathy:    He has no cervical adenopathy.    Neurological: He is alert and oriented to person, place, and time.       President-- "Obama, Felix Pacini, Clinton" [phone] [id number] (didn't think he could do world but did do word) Recall 0/3          Assessment & Plan:

## 2012-03-10 NOTE — Assessment & Plan Note (Signed)
Hard to define state in last several mornings Sense of "something in his head" ??abnormal dream state No apparent med causes Some memory problems----???early Lewy body dementia (I doubt)  He will try off the hydrocodone for a while May need further eval if persists---?neurology

## 2012-03-13 ENCOUNTER — Ambulatory Visit (INDEPENDENT_AMBULATORY_CARE_PROVIDER_SITE_OTHER): Payer: Medicare Other | Admitting: Internal Medicine

## 2012-03-13 DIAGNOSIS — I4891 Unspecified atrial fibrillation: Secondary | ICD-10-CM

## 2012-03-13 DIAGNOSIS — Z5181 Encounter for therapeutic drug level monitoring: Secondary | ICD-10-CM

## 2012-03-13 DIAGNOSIS — Z7901 Long term (current) use of anticoagulants: Secondary | ICD-10-CM

## 2012-03-13 LAB — POCT INR: INR: 2.3

## 2012-03-13 NOTE — Patient Instructions (Signed)
Continue current dose, check in 4 weeks  

## 2012-04-10 ENCOUNTER — Ambulatory Visit (INDEPENDENT_AMBULATORY_CARE_PROVIDER_SITE_OTHER): Payer: Medicare Other | Admitting: Family Medicine

## 2012-04-10 DIAGNOSIS — Z7901 Long term (current) use of anticoagulants: Secondary | ICD-10-CM

## 2012-04-10 DIAGNOSIS — I4891 Unspecified atrial fibrillation: Secondary | ICD-10-CM

## 2012-04-10 DIAGNOSIS — Z5181 Encounter for therapeutic drug level monitoring: Secondary | ICD-10-CM

## 2012-04-10 NOTE — Patient Instructions (Signed)
Continue current dose, check in 4 weeks  

## 2012-04-20 ENCOUNTER — Other Ambulatory Visit: Payer: Self-pay | Admitting: Internal Medicine

## 2012-04-20 ENCOUNTER — Other Ambulatory Visit: Payer: Self-pay | Admitting: *Deleted

## 2012-04-20 NOTE — Telephone Encounter (Signed)
Ok to refill? Last OV 03/10/12.

## 2012-04-21 MED ORDER — HYDROCODONE-ACETAMINOPHEN 5-500 MG PO TABS: ORAL_TABLET | ORAL | Status: DC

## 2012-04-21 NOTE — Telephone Encounter (Signed)
Okay #90 x 0 

## 2012-04-21 NOTE — Telephone Encounter (Signed)
rx called into pharmacy

## 2012-05-08 ENCOUNTER — Ambulatory Visit (INDEPENDENT_AMBULATORY_CARE_PROVIDER_SITE_OTHER): Payer: Medicare Other | Admitting: Internal Medicine

## 2012-05-08 DIAGNOSIS — I4891 Unspecified atrial fibrillation: Secondary | ICD-10-CM

## 2012-05-08 DIAGNOSIS — Z7901 Long term (current) use of anticoagulants: Secondary | ICD-10-CM

## 2012-05-08 DIAGNOSIS — Z5181 Encounter for therapeutic drug level monitoring: Secondary | ICD-10-CM

## 2012-05-08 NOTE — Patient Instructions (Signed)
Continue 5 mg daily recheck 4 weeks 

## 2012-05-17 ENCOUNTER — Other Ambulatory Visit: Payer: Self-pay | Admitting: Internal Medicine

## 2012-05-28 ENCOUNTER — Other Ambulatory Visit: Payer: Self-pay | Admitting: Cardiology

## 2012-06-01 ENCOUNTER — Other Ambulatory Visit: Payer: Self-pay | Admitting: *Deleted

## 2012-06-01 MED ORDER — HYDROCODONE-ACETAMINOPHEN 5-500 MG PO TABS: 1.0000 | ORAL_TABLET | Freq: Three times a day (TID) | ORAL | Status: DC | PRN

## 2012-06-01 NOTE — Telephone Encounter (Signed)
Okay #90 x 0 

## 2012-06-01 NOTE — Telephone Encounter (Signed)
rx called into pharmacy

## 2012-06-01 NOTE — Telephone Encounter (Signed)
Received refill request from pharmacy. Is it okay to refill medication? 

## 2012-06-05 ENCOUNTER — Ambulatory Visit (INDEPENDENT_AMBULATORY_CARE_PROVIDER_SITE_OTHER): Payer: Medicare Other | Admitting: Internal Medicine

## 2012-06-05 DIAGNOSIS — Z7901 Long term (current) use of anticoagulants: Secondary | ICD-10-CM

## 2012-06-05 DIAGNOSIS — I4891 Unspecified atrial fibrillation: Secondary | ICD-10-CM

## 2012-06-05 DIAGNOSIS — Z5181 Encounter for therapeutic drug level monitoring: Secondary | ICD-10-CM

## 2012-06-05 LAB — POCT INR: INR: 2.3

## 2012-06-05 NOTE — Patient Instructions (Signed)
Continue 5 mg daily recheck 4 weeks 

## 2012-07-03 ENCOUNTER — Ambulatory Visit (INDEPENDENT_AMBULATORY_CARE_PROVIDER_SITE_OTHER): Payer: Medicare Other | Admitting: Internal Medicine

## 2012-07-03 DIAGNOSIS — I4891 Unspecified atrial fibrillation: Secondary | ICD-10-CM

## 2012-07-03 DIAGNOSIS — Z7901 Long term (current) use of anticoagulants: Secondary | ICD-10-CM

## 2012-07-03 DIAGNOSIS — Z5181 Encounter for therapeutic drug level monitoring: Secondary | ICD-10-CM

## 2012-07-03 NOTE — Patient Instructions (Signed)
Continue current dose, check in 4 weeks  

## 2012-07-06 ENCOUNTER — Other Ambulatory Visit: Payer: Self-pay | Admitting: *Deleted

## 2012-07-06 NOTE — Telephone Encounter (Signed)
OK to refill? Last OV 03/10/12.

## 2012-07-07 MED ORDER — HYDROCODONE-ACETAMINOPHEN 5-500 MG PO TABS: 1.0000 | ORAL_TABLET | Freq: Three times a day (TID) | ORAL | Status: DC | PRN

## 2012-07-07 NOTE — Telephone Encounter (Signed)
rx called into pharmacy

## 2012-07-07 NOTE — Telephone Encounter (Signed)
Okay #90 x 0 

## 2012-07-21 ENCOUNTER — Ambulatory Visit (INDEPENDENT_AMBULATORY_CARE_PROVIDER_SITE_OTHER): Payer: Medicare Other | Admitting: Internal Medicine

## 2012-07-21 ENCOUNTER — Encounter: Payer: Self-pay | Admitting: Internal Medicine

## 2012-07-21 VITALS — BP 118/74 | HR 92 | Temp 97.6°F | Ht 67.0 in | Wt 120.5 lb

## 2012-07-21 DIAGNOSIS — J449 Chronic obstructive pulmonary disease, unspecified: Secondary | ICD-10-CM

## 2012-07-21 DIAGNOSIS — M159 Polyosteoarthritis, unspecified: Secondary | ICD-10-CM

## 2012-07-21 DIAGNOSIS — E785 Hyperlipidemia, unspecified: Secondary | ICD-10-CM

## 2012-07-21 DIAGNOSIS — I2510 Atherosclerotic heart disease of native coronary artery without angina pectoris: Secondary | ICD-10-CM

## 2012-07-21 DIAGNOSIS — I4891 Unspecified atrial fibrillation: Secondary | ICD-10-CM

## 2012-07-21 NOTE — Assessment & Plan Note (Signed)
Seems to be quiet Intolerant of multiple statins

## 2012-07-21 NOTE — Progress Notes (Signed)
Subjective:    Patient ID: [name], male    DOB: 1928-04-09, 78 y.o.   MRN: [id number]  HPI Not doing well Started on statin by Dr Donnie Aho in May---has felt weak, no energy, sluggist. 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 amitriptyline (ELAVIL) 25 MG tablet Take 25 mg by mouth at bedtime.      Marland Kitchen aspirin 81 MG tablet Take 81 mg by mouth daily.        Marland Kitchen diltiazem (CARDIZEM CD) 180 MG 24 hr capsule Take 1 by mouth two times a day      . HYDROcodone-acetaminophen (VICODIN) 5-500 MG per tablet Take 1 tablet by mouth 3 (three) times daily as needed for pain.  90 tablet  0  . levothyroxine (SYNTHROID, LEVOTHROID) 25 MCG tablet TAKE 1 TABLET EVERY DAY  90 tablet  3  . NITROSTAT 0.4 MG SL tablet PLACE 1TAB UNDER TONGUE EVERY 5 MINS TIL CHEST PAIN IS GONE*UP TO 3DOSES NO RELEIF, GET HELP*  25 tablet  0  . pravastatin (PRAVACHOL) 20 MG tablet       . PROVENTIL HFA 108 (90 BASE) MCG/ACT inhaler INHALE 2 PUFFS INTO THE LUNGS EVERY 6 (SIX) HOURS AS NEEDED FOR WHEEZING.  6.7 g  2  . ramipril (ALTACE) 5 MG capsule Take 1 by mouth daily      . RAPAFLO 8 MG CAPS Take 1 by mouth once daily      . SPIRIVA HANDIHALER 18 MCG inhalation capsule PLACE 1 CAPSULE INTO INHALER AND INHALE ONCE DAILY.  30 each  10  . warfarin (COUMADIN) 5 MG tablet USE AS DIRECTED  90 tablet  3    Allergies  Allergen Reactions  . Atorvastatin     REACTION: unspecified  .  Ezetimibe-Simvastatin     REACTION: unspecified  . Fenofibrate     REACTION: "ran me up a wall"  . Penicillins     REACTION: unspecified    Past Medical History  Diagnosis Date  . Allergy   . CAD (coronary artery disease)   . Hyperlipidemia   . Kidney stones   . BPH (benign prostatic hypertrophy)   . Osteoarthritis   . COPD (chronic obstructive pulmonary disease)   . Hypothyroidism   . Atrial fibrillation   . Impaired fasting glucose     Past Surgical History  Procedure Date  . Inguinal hernia repair 09/2004    left  . Back surgery 1990's    x3  . Transurethral resection of prostate 2002  . Pleural scarification 1980    right  . Esophagogastroduodenoscopy 04/2002    negative  . Bladder stone removal 06/2006    Family History  Problem Relation Age of Onset  . Heart disease Mother   . Heart disease Brother   . Stroke Brother   . Cancer Neg Hx   . Diabetes Neg Hx   . Hypertension Neg Hx   . Heart disease Brother   .  Stroke Brother   . Heart disease Brother   . Stroke Brother     History   Social History  . Marital Status: Married    Spouse Name: N/A    Number of Children: 1  . Years of Education: N/A   Occupational History  . retired Therapist, music    Social History Main Topics  . Smoking status: Former Smoker    Types: Cigarettes    Quit date: 12/23/2001  . Smokeless tobacco: Never Used  . Alcohol Use: No  . Drug Use: No  . Sexually Active: Not on file   Other Topics Concern  . Not on file   Social History Narrative   Does lots of yard work and a big garden   Review of Systems Sleeps fair---occ problems due to right arm pain Appetite is okay but he has lost almost 10#     Objective:   Physical Exam  Constitutional: He appears well-developed. No distress.  Neck: Normal range of motion. Neck supple. No thyromegaly present.  Cardiovascular: Normal rate, regular rhythm and normal heart sounds.  Exam reveals no gallop.   No murmur heard.       Regular with frequent skips  Pulmonary/Chest: Effort normal. No respiratory distress. He has no wheezes. He has no rales.       Decreased breath sounds but clear  Abdominal: Soft. There is no tenderness.  Musculoskeletal: He exhibits no edema.       Mild decreased ROM in right shoulder but not really painful Right wrist is painful---markedly decreased grip strength and some tenderness  Lymphadenopathy:    He has no cervical adenopathy.  Psychiatric: He has a normal mood and affect. His behavior is normal.          Assessment & Plan:

## 2012-07-21 NOTE — Assessment & Plan Note (Signed)
Regular rhythym On coumadin

## 2012-07-21 NOTE — Assessment & Plan Note (Signed)
Has felt terrible since starting the pravastatin Will stop and recheck if not better after being off

## 2012-07-21 NOTE — Assessment & Plan Note (Signed)
Right wrist is especially bad now Will set up with Dr Ocie Cornfield benefit from injection

## 2012-07-21 NOTE — Assessment & Plan Note (Signed)
Mild AM dyspnea but stable No major issues with this

## 2012-07-21 NOTE — Patient Instructions (Signed)
Please stop the pravastatin. Call for appointment for reevaluation if you are not feeling better within a few weeks of stopping this  Please set up an appt with Dr Patsy Lager to evaluate your right wrist

## 2012-07-22 ENCOUNTER — Ambulatory Visit (INDEPENDENT_AMBULATORY_CARE_PROVIDER_SITE_OTHER): Payer: Medicare Other | Admitting: Family Medicine

## 2012-07-22 ENCOUNTER — Encounter: Payer: Self-pay | Admitting: Family Medicine

## 2012-07-22 VITALS — BP 120/70 | HR 77 | Temp 98.1°F | Ht 67.0 in | Wt 122.5 lb

## 2012-07-22 DIAGNOSIS — M19039 Primary osteoarthritis, unspecified wrist: Secondary | ICD-10-CM

## 2012-07-22 NOTE — Progress Notes (Signed)
[facility] conservation officer at [facility] [address] [place] [address] Phone: [phone] Fax: [phone]  Date:  07/22/2012   Name:  Abdulraheem Pineo   DOB:  October 15, 1928   MRN:  [id number]  PCP:  Tillman Abide, MD    Chief Complaint: Wrist Pain   History of Present Illness:  [name] is a 78 y.o. very pleasant male patient who worked at [employer] for > 30 years with his hands who presents with the following: wrist pain  R wrist, has been bothering him about three weeks ago and getting worse and worse. He has some limitation in his motion, decreased grip strength and pain. Small/moderate wrist effusion. No known trauma or injury at all. Extensive OA in multiple joints and on chronic anticoagulation with Coumadin.  Past Medical History, Surgical History, Social History, Family History, Problem List, Medications, and Allergies have been reviewed and updated if relevant.  Current Outpatient Prescriptions on File Prior to Visit  Medication Sig Dispense Refill  . amitriptyline (ELAVIL) 25 MG tablet Take 25 mg by mouth at bedtime.      Marland Kitchen aspirin 81 MG tablet Take 81 mg by mouth daily.        Marland Kitchen diltiazem (CARDIZEM CD) 180 MG 24 hr capsule Take 1 by mouth two times a day      . HYDROcodone-acetaminophen (VICODIN) 5-500 MG per tablet Take 1 tablet by mouth 3 (three) times daily as needed for pain.  90 tablet  0  . levothyroxine (SYNTHROID, LEVOTHROID) 25 MCG tablet TAKE 1 TABLET EVERY DAY  90 tablet  3  . NITROSTAT 0.4 MG SL tablet PLACE 1TAB UNDER TONGUE EVERY 5 MINS TIL CHEST PAIN IS GONE*UP TO 3DOSES NO RELEIF, GET HELP*  25 tablet  0  . PROVENTIL HFA 108 (90 BASE) MCG/ACT inhaler INHALE 2 PUFFS INTO THE LUNGS EVERY 6 (SIX) HOURS AS NEEDED FOR WHEEZING.  6.7 g  2  . ramipril (ALTACE) 5 MG capsule Take 1 by mouth daily      . RAPAFLO 8 MG CAPS Take 1 by mouth once daily      . SPIRIVA HANDIHALER 18 MCG inhalation capsule PLACE 1 CAPSULE INTO INHALER AND INHALE ONCE DAILY.  30 each   10  . warfarin (COUMADIN) 5 MG tablet USE AS DIRECTED  90 tablet  3    Review of Systems:  GEN: No fevers, chills. Nontoxic. Primarily MSK c/o today. MSK: Detailed in the HPI GI: tolerating PO intake without difficulty Neuro: No numbness, parasthesias, or tingling associated. Otherwise the pertinent positives of the ROS are noted above.    Physical Examination: Filed Vitals:   07/22/12 0851  BP: 120/70  Pulse: 77  Temp: 98.1 F (36.7 C)   Filed Vitals:   07/22/12 0851  Height: 5\' 7"  (1.702 m)  Weight: 122 lb 8 oz (55.566 kg)   Body mass index is 19.19 kg/(m^2). Ideal Body Weight: Weight in (lb) to have BMI = 25: 159.3    GEN: WDWN, NAD, Non-toxic, Alert & Oriented x 3 HEENT: Atraumatic, Normocephalic.  Ears and Nose: No external deformity. EXTR: No clubbing/cyanosis/edema NEURO: Normal gait.  PSYCH: Normally interactive. Conversant. Not depressed or anxious appearing.  Calm demeanor.   Hand: R Ecchymosis or edema: neg ROM wrist/hand/digits/elbow: notable decreased ROM in all directions, particularly flexion and ext  Carpals, MCP's, digits: diffuse mCP and pip swelling and OA changes Distal Ulna and Radius: NT Ecchymosis or edema: neg Cysts/nodules: neg Finkelstein's test: neg Snuffbox tenderness: neg  Scaphoid tubercle: NT Hook of Hamate: NT Resisted supination: mild pain Full composite fist Grip, all digits: 4/5 str No tenosynovitis Axial load test: pain Atrophy: neg  Hand sensation: intact   Assessment and Plan: 1. Wrist arthritis     Probable OA flare. On coumadin  Place patient in a cock-up wrist splint for the next week, then at night prn  Injection, Intraarticular Wrist, R Patient verbally consented for procedure; risks (including infection), benefits, and alternatives explained. Patient prepped with Chloraprep. Ethyl chloride used for anesthesia. Using sterile technique, just distal to Lister's tubercle, using 3 cc syringe with 22 gauge needle,  needle inserted and aspirated, no blood. Then 1/2 cc Lidocaine 1% and 1/2 cc Depo-Medrol 40 mg (equivalent to Depo-medrol 20 mg) injected without difficulty into wrist.  Pressue applied, minimal blood. Tolerated well, decreased pain, no complications.   Hannah Beat, MD

## 2012-07-31 ENCOUNTER — Ambulatory Visit (INDEPENDENT_AMBULATORY_CARE_PROVIDER_SITE_OTHER): Payer: Medicare Other | Admitting: Internal Medicine

## 2012-07-31 DIAGNOSIS — Z5181 Encounter for therapeutic drug level monitoring: Secondary | ICD-10-CM

## 2012-07-31 DIAGNOSIS — I4891 Unspecified atrial fibrillation: Secondary | ICD-10-CM

## 2012-07-31 DIAGNOSIS — Z7901 Long term (current) use of anticoagulants: Secondary | ICD-10-CM

## 2012-07-31 NOTE — Patient Instructions (Addendum)
(   hold today then resume 5 mg daily , check in 1 week)

## 2012-08-10 ENCOUNTER — Ambulatory Visit (INDEPENDENT_AMBULATORY_CARE_PROVIDER_SITE_OTHER): Payer: Medicare Other | Admitting: Internal Medicine

## 2012-08-10 ENCOUNTER — Other Ambulatory Visit: Payer: Self-pay | Admitting: *Deleted

## 2012-08-10 DIAGNOSIS — Z7901 Long term (current) use of anticoagulants: Secondary | ICD-10-CM

## 2012-08-10 LAB — POCT INR: INR: 3.4

## 2012-08-10 MED ORDER — HYDROCODONE-ACETAMINOPHEN 5-500 MG PO TABS: 1.0000 | ORAL_TABLET | Freq: Three times a day (TID) | ORAL | Status: DC | PRN

## 2012-08-10 MED ORDER — WARFARIN SODIUM 2.5 MG PO TABS: ORAL_TABLET | ORAL | Status: DC

## 2012-08-10 NOTE — Patient Instructions (Signed)
5 mg daily , except 2.5 mg Tues/Thurs, check in 2 weeks(reduced 5 mg weekly)

## 2012-08-10 NOTE — Telephone Encounter (Signed)
Last filled 07/07/2012

## 2012-08-10 NOTE — Telephone Encounter (Signed)
Okay #90 x 0 

## 2012-08-10 NOTE — Telephone Encounter (Signed)
rx called into pharmacy

## 2012-08-25 ENCOUNTER — Ambulatory Visit (INDEPENDENT_AMBULATORY_CARE_PROVIDER_SITE_OTHER): Payer: Medicare Other | Admitting: Internal Medicine

## 2012-08-25 DIAGNOSIS — Z7901 Long term (current) use of anticoagulants: Secondary | ICD-10-CM

## 2012-08-25 DIAGNOSIS — I4891 Unspecified atrial fibrillation: Secondary | ICD-10-CM

## 2012-08-25 DIAGNOSIS — Z5181 Encounter for therapeutic drug level monitoring: Secondary | ICD-10-CM

## 2012-08-25 NOTE — Patient Instructions (Signed)
5 mg daily , except 2.5 mg Tues/Thurs, check in 4 weeks 

## 2012-09-14 ENCOUNTER — Other Ambulatory Visit: Payer: Self-pay | Admitting: *Deleted

## 2012-09-14 NOTE — Telephone Encounter (Signed)
Last filled 08/10/2012 

## 2012-09-15 MED ORDER — HYDROCODONE-ACETAMINOPHEN 5-500 MG PO TABS
1.0000 | ORAL_TABLET | Freq: Three times a day (TID) | ORAL | Status: DC | PRN
Start: 2012-09-14 — End: 2012-10-19

## 2012-09-15 NOTE — Telephone Encounter (Signed)
Okay #90 x 0 

## 2012-09-15 NOTE — Telephone Encounter (Signed)
rx called into pharmacy

## 2012-09-18 ENCOUNTER — Other Ambulatory Visit: Payer: Self-pay | Admitting: Internal Medicine

## 2012-09-21 ENCOUNTER — Other Ambulatory Visit: Payer: Self-pay | Admitting: Internal Medicine

## 2012-09-22 ENCOUNTER — Ambulatory Visit (INDEPENDENT_AMBULATORY_CARE_PROVIDER_SITE_OTHER): Payer: Medicare Other | Admitting: Internal Medicine

## 2012-09-22 DIAGNOSIS — I4891 Unspecified atrial fibrillation: Secondary | ICD-10-CM

## 2012-09-22 DIAGNOSIS — Z7901 Long term (current) use of anticoagulants: Secondary | ICD-10-CM

## 2012-09-22 DIAGNOSIS — Z5181 Encounter for therapeutic drug level monitoring: Secondary | ICD-10-CM

## 2012-09-22 NOTE — Patient Instructions (Signed)
Continue current dose, check in 4 weeks  

## 2012-10-19 ENCOUNTER — Other Ambulatory Visit: Payer: Self-pay | Admitting: Internal Medicine

## 2012-10-19 MED ORDER — NITROGLYCERIN 0.4 MG SL SUBL: 0.4000 mg | SUBLINGUAL_TABLET | SUBLINGUAL | Status: DC | PRN

## 2012-10-19 NOTE — Telephone Encounter (Signed)
Okay to refill the NTG #100 x 0 Find out how much of this he is taking

## 2012-10-19 NOTE — Telephone Encounter (Signed)
rx sent to pharmacy by e-script rx called into pharmacy .left message to have patient return my call.

## 2012-10-19 NOTE — Telephone Encounter (Signed)
Pt just had his nitrostat refilled on 09/30/12, was given # 25. Is this ok to refill?

## 2012-10-19 NOTE — Telephone Encounter (Signed)
Okay #90 x 0 

## 2012-10-20 ENCOUNTER — Ambulatory Visit (INDEPENDENT_AMBULATORY_CARE_PROVIDER_SITE_OTHER): Payer: Medicare Other | Admitting: Internal Medicine

## 2012-10-20 DIAGNOSIS — Z7901 Long term (current) use of anticoagulants: Secondary | ICD-10-CM

## 2012-10-20 DIAGNOSIS — Z5181 Encounter for therapeutic drug level monitoring: Secondary | ICD-10-CM

## 2012-10-20 DIAGNOSIS — I4891 Unspecified atrial fibrillation: Secondary | ICD-10-CM

## 2012-10-20 LAB — POCT INR: INR: 2.8

## 2012-10-20 NOTE — Patient Instructions (Signed)
5 mg daily , except 2.5 mg Tues/Thurs, check in 4 weeks

## 2012-11-17 ENCOUNTER — Ambulatory Visit (INDEPENDENT_AMBULATORY_CARE_PROVIDER_SITE_OTHER): Payer: Medicare Other | Admitting: Family Medicine

## 2012-11-17 ENCOUNTER — Ambulatory Visit: Payer: Medicare Other

## 2012-11-17 DIAGNOSIS — I4891 Unspecified atrial fibrillation: Secondary | ICD-10-CM

## 2012-11-17 DIAGNOSIS — Z7901 Long term (current) use of anticoagulants: Secondary | ICD-10-CM

## 2012-11-17 DIAGNOSIS — Z5181 Encounter for therapeutic drug level monitoring: Secondary | ICD-10-CM

## 2012-11-17 NOTE — Patient Instructions (Signed)
Continue current dose, check in 4 weeks  

## 2012-12-07 ENCOUNTER — Other Ambulatory Visit: Payer: Self-pay | Admitting: Internal Medicine

## 2012-12-07 NOTE — Telephone Encounter (Signed)
Okay #90 x 0 

## 2012-12-07 NOTE — Telephone Encounter (Signed)
rx called into pharmacy

## 2012-12-09 ENCOUNTER — Other Ambulatory Visit: Payer: Self-pay | Admitting: Internal Medicine

## 2012-12-24 ENCOUNTER — Ambulatory Visit (INDEPENDENT_AMBULATORY_CARE_PROVIDER_SITE_OTHER): Payer: Medicare Other | Admitting: General Practice

## 2012-12-24 DIAGNOSIS — I4891 Unspecified atrial fibrillation: Secondary | ICD-10-CM

## 2012-12-24 DIAGNOSIS — Z5181 Encounter for therapeutic drug level monitoring: Secondary | ICD-10-CM

## 2012-12-24 DIAGNOSIS — Z7901 Long term (current) use of anticoagulants: Secondary | ICD-10-CM

## 2012-12-24 LAB — POCT INR: INR: 2

## 2012-12-24 NOTE — Patient Instructions (Signed)
Continue to take 5 mg daily , except 2.5 mg Tues/Thurs, check in 4 weeks 

## 2012-12-29 ENCOUNTER — Other Ambulatory Visit: Payer: Self-pay | Admitting: Internal Medicine

## 2013-01-21 ENCOUNTER — Ambulatory Visit (INDEPENDENT_AMBULATORY_CARE_PROVIDER_SITE_OTHER): Payer: Medicare Other | Admitting: General Practice

## 2013-01-21 DIAGNOSIS — I4891 Unspecified atrial fibrillation: Secondary | ICD-10-CM

## 2013-01-21 DIAGNOSIS — Z5181 Encounter for therapeutic drug level monitoring: Secondary | ICD-10-CM

## 2013-01-21 DIAGNOSIS — Z7901 Long term (current) use of anticoagulants: Secondary | ICD-10-CM

## 2013-01-21 NOTE — Patient Instructions (Signed)
Continue to take 5 mg daily , except 2.5 mg Tues/Thurs, check in 4 weeks

## 2013-01-22 ENCOUNTER — Encounter: Payer: Medicare Other | Admitting: Internal Medicine

## 2013-01-25 ENCOUNTER — Other Ambulatory Visit: Payer: Self-pay | Admitting: Internal Medicine

## 2013-01-25 MED ORDER — HYDROCODONE-ACETAMINOPHEN 5-325 MG PO TABS: 1.0000 | ORAL_TABLET | Freq: Three times a day (TID) | ORAL | Status: DC | PRN

## 2013-01-25 NOTE — Telephone Encounter (Signed)
Okay #90 x 0 

## 2013-01-25 NOTE — Telephone Encounter (Signed)
rx called into pharmacy

## 2013-01-26 ENCOUNTER — Telehealth: Payer: Self-pay

## 2013-01-26 NOTE — Telephone Encounter (Signed)
Pt left note that for one week getting more SOB;pt thinks he may need to start on oxygen.Last night pt slept in almost a sitting position. Pt said no chest pain, dizziness or h/a. Pt does not want to go to UC or ED wants appt with Dr Alphonsus Sias. Scheduled pt appt 01/27/13 at 1:30 for 30 min appt. Advised pt if condition changed or worsened would need to go for eval at Ed. Pt voiced understanding.

## 2013-01-27 ENCOUNTER — Ambulatory Visit (INDEPENDENT_AMBULATORY_CARE_PROVIDER_SITE_OTHER): Payer: Medicare Other | Admitting: Family Medicine

## 2013-01-27 ENCOUNTER — Encounter: Payer: Self-pay | Admitting: Family Medicine

## 2013-01-27 VITALS — BP 110/78 | HR 104 | Temp 97.6°F | Resp 24 | Wt 129.8 lb

## 2013-01-27 DIAGNOSIS — J441 Chronic obstructive pulmonary disease with (acute) exacerbation: Secondary | ICD-10-CM

## 2013-01-27 DIAGNOSIS — J449 Chronic obstructive pulmonary disease, unspecified: Secondary | ICD-10-CM

## 2013-01-27 MED ORDER — PREDNISONE 20 MG PO TABS: ORAL_TABLET | ORAL | Status: DC

## 2013-01-27 MED ORDER — ALBUTEROL SULFATE HFA 108 (90 BASE) MCG/ACT IN AERS: 2.0000 | INHALATION_SPRAY | RESPIRATORY_TRACT | Status: DC | PRN

## 2013-01-27 MED ORDER — FORMOTEROL FUMARATE 12 MCG IN CAPS: 12.0000 ug | ORAL_CAPSULE | Freq: Two times a day (BID) | RESPIRATORY_TRACT | Status: DC

## 2013-01-27 NOTE — Telephone Encounter (Signed)
Has appt tomorrow Please change to 15 minutes---should get oximetry at rest and with walking on room air Can schedule another acute at 1:30 for me to see while you are doing the oximetry measurements

## 2013-01-27 NOTE — Telephone Encounter (Signed)
appt changed

## 2013-01-27 NOTE — Progress Notes (Signed)
[facility] [address] [place] [address] Phone: [phone] Fax: [phone]  Date:  01/27/2013   Name:  [name]   DOB:  05/12/1928   MRN:  [id number] Gender: male Age: 80 y.o.  Primary Physician:  Tillman Abide, MD  Evaluating MD: Hannah Beat, MD   Chief Complaint: trouble catching breath   History of Present Illness:  [name] is a 79 y.o. pleasant patient who presents with the following:  I was asked to emergently evaluate the patient for acute respiratory distress. On evaluation, he is in no distress and pulse ox is 95-99% on RA. Breathing comfortably.  Has COPD, the days. Coughing up some sputum. In the last year or so, he is coughing up a lot of phlegm. On spiriva. Using albuterol BID. History of tobacco abuse, stopped about 10 years ago.   Much more short of breath over the last 3 days. Trouble walking. No chest pain.  Patient Active Problem List  Diagnosis  . HYPERLIPIDEMIA  . NEUROPATHY  . CORONARY ARTERY DISEASE  . ATRIAL FIBRILLATION  . ALLERGIC RHINITIS  . COPD  . RENAL CALCULUS  . HYPERTROPHY PROSTATE W/O UR OBST & OTH LUTS  . Osteoarthrosis involving, or with mention of more than one site, but not specified as generalized, multiple sites  . LEG PAIN, LEFT  . HYPOTHYROIDISM  . DYSPNEA/SHORTNESS OF BREATH  . Impaired fasting glucose  . Right hip pain  . Actinic keratosis  . Abnormal mental state    Past Medical History  Diagnosis Date  . Allergy   . CAD (coronary artery disease)   . Hyperlipidemia   . Kidney stones   . BPH (benign prostatic hypertrophy)   . Osteoarthritis   . COPD (chronic obstructive pulmonary disease)   . Hypothyroidism   . Atrial fibrillation   . Impaired fasting glucose     Past Surgical History  Procedure Date  . Inguinal hernia repair 09/2004    left  . Back surgery 1990's    x3  . Transurethral resection of prostate 2002  . Pleural scarification 1980    right  .  Esophagogastroduodenoscopy 04/2002    negative  . Bladder stone removal 06/2006    History  Substance Use Topics  . Smoking status: Former Smoker    Types: Cigarettes    Quit date: 12/23/2001  . Smokeless tobacco: Never Used  . Alcohol Use: No    Family History  Problem Relation Age of Onset  . Heart disease Mother   . Heart disease Brother   . Stroke Brother   . Cancer Neg Hx   . Diabetes Neg Hx   . Hypertension Neg Hx   . Heart disease Brother   . Stroke Brother   . Heart disease Brother   . Stroke Brother     Allergies  Allergen Reactions  . Atorvastatin     REACTION: unspecified  . Ezetimibe-Simvastatin     REACTION: unspecified  . Fenofibrate     REACTION: "ran me up a wall"  . Penicillins     REACTION: unspecified    Medication list has been reviewed and updated.  Outpatient Prescriptions Prior to Visit  Medication Sig Dispense Refill  . amitriptyline (ELAVIL) 25 MG tablet Take 25 mg by mouth at bedtime.      Marland Kitchen aspirin 81 MG tablet Take 81 mg by mouth daily.        Marland Kitchen diltiazem (CARDIZEM CD) 180 MG 24 hr capsule  Take 1 by mouth two times a day      . HYDROcodone-acetaminophen (NORCO/VICODIN) 5-325 MG per tablet Take 1 tablet by mouth 3 (three) times daily as needed.  90 tablet  0  . levothyroxine (SYNTHROID, LEVOTHROID) 25 MCG tablet TAKE 1 TABLET EVERY DAY  90 tablet  3  . nitroGLYCERIN (NITROSTAT) 0.4 MG SL tablet Place 1 tablet (0.4 mg total) under the tongue every 5 (five) minutes as needed for chest pain.  100 tablet  0  . pravastatin (PRAVACHOL) 20 MG tablet       . PROVENTIL HFA 108 (90 BASE) MCG/ACT inhaler INHALE 2 PUFFS INTO THE LUNGS EVERY 6 (SIX) HOURS AS NEEDED FOR WHEEZING.  6 each  2  . PROVENTIL HFA 108 (90 BASE) MCG/ACT inhaler INHALE 2 PUFFS INTO THE LUNGS EVERY 6 (SIX) HOURS AS NEEDED FOR WHEEZING.  6 each  2  . ramipril (ALTACE) 5 MG capsule Take 1 by mouth daily      . RAPAFLO 8 MG CAPS Take 1 by mouth once daily      . SPIRIVA  HANDIHALER 18 MCG inhalation capsule PLACE 1 CAPSULE INTO INHALER AND INHALE ONCE DAILY.  30 each  10  . warfarin (COUMADIN) 2.5 MG tablet Take one tab twice weekly or as directed  30 tablet  3  . warfarin (COUMADIN) 5 MG tablet USE AS DIRECTED  90 tablet  3   Last reviewed on 01/27/2013  1:37 PM by Loma Messing, RN  Review of Systems:  ROS: GEN: Acute illness details above GI: Tolerating PO intake GU: maintaining adequate hydration and urination Pulm: SOB Interactive and getting along well at home.  Otherwise, ROS is as per the HPI.   Physical Examination: BP 110/78  Pulse 104  Temp 97.6 F (36.4 C) (Oral)  Resp 24  Wt 129 lb 12 oz (58.854 kg)  SpO2 95%  Ideal Body Weight:     GEN: WDWN, NAD, Non-toxic, A & O x 3 HEENT: Atraumatic, Normocephalic. Neck supple. No masses, No LAD. Ears and Nose: No external deformity. CV: RRR on my exam. No JVD. No thrill. No extra heart sounds. PULM: diffuse wheezing, no crackles, no rhonchi. EXTR: No c/c/e NEURO Normal gait.  PSYCH: Normally interactive. Conversant. Not depressed or anxious appearing.  Calm demeanor.    Assessment and Plan:  1. COPD exacerbation   2. COPD    Acute COPD exac Pred as below Lowest point when walked was 93% o2 Add BID Foradil. Albuterol prn if SOB  F/u to recheck with Dr. Alphonsus Sias in 1 week  Orders Today:  No orders of the defined types were placed in this encounter.    Updated Medication List: (Includes new medications, updates to list, dose adjustments) Meds ordered this encounter  Medications  . predniSONE (DELTASONE) 20 MG tablet    Sig: 2 tabs po daily for 4 days, then 1 po daily for 3 days    Dispense:  11 tablet    Refill:  0  . formoterol (FORADIL) 12 MCG capsule for inhaler    Sig: Place 1 capsule (12 mcg total) into inhaler and inhale 2 (two) times daily.    Dispense:  60 capsule    Refill:  5  . albuterol (PROVENTIL HFA) 108 (90 BASE) MCG/ACT inhaler    Sig: Inhale 2 puffs  into the lungs every 4 (four) hours as needed for wheezing.    Dispense:  6 each    Refill:  2  Medications Discontinued: Medications Discontinued During This Encounter  Medication Reason  . PROVENTIL HFA 108 (90 BASE) MCG/ACT inhaler   . PROVENTIL HFA 108 (90 BASE) MCG/ACT inhaler Reorder     Signed, Karleen Hampshire T. Mikael Skoda, MD 01/27/2013 1:49 PM

## 2013-01-27 NOTE — Patient Instructions (Addendum)
Formoterol Caps - use 1 twice a day Hold the albuterol inhaler and only use that when very short of breath or sick Keep using your Spiriva inhaler

## 2013-01-28 ENCOUNTER — Ambulatory Visit: Payer: Medicare Other | Admitting: Internal Medicine

## 2013-02-02 ENCOUNTER — Ambulatory Visit (INDEPENDENT_AMBULATORY_CARE_PROVIDER_SITE_OTHER): Payer: Medicare Other | Admitting: Internal Medicine

## 2013-02-02 ENCOUNTER — Encounter: Payer: Self-pay | Admitting: Internal Medicine

## 2013-02-02 VITALS — BP 128/72 | HR 126 | Temp 98.4°F | Ht 67.0 in | Wt 135.0 lb

## 2013-02-02 DIAGNOSIS — E785 Hyperlipidemia, unspecified: Secondary | ICD-10-CM

## 2013-02-02 DIAGNOSIS — I2510 Atherosclerotic heart disease of native coronary artery without angina pectoris: Secondary | ICD-10-CM

## 2013-02-02 DIAGNOSIS — I4891 Unspecified atrial fibrillation: Secondary | ICD-10-CM

## 2013-02-02 DIAGNOSIS — E039 Hypothyroidism, unspecified: Secondary | ICD-10-CM

## 2013-02-02 DIAGNOSIS — J449 Chronic obstructive pulmonary disease, unspecified: Secondary | ICD-10-CM

## 2013-02-02 DIAGNOSIS — Z0000 Encounter for general adult medical examination without abnormal findings: Secondary | ICD-10-CM | POA: Insufficient documentation

## 2013-02-02 DIAGNOSIS — M159 Polyosteoarthritis, unspecified: Secondary | ICD-10-CM

## 2013-02-02 LAB — CBC WITH DIFFERENTIAL/PLATELET
Eosinophils Relative: 0.4 % (ref 0.0–5.0)
HCT: 39.4 % (ref 39.0–52.0)
Hemoglobin: 13 g/dL (ref 13.0–17.0)
Lymphs Abs: 0.5 10*3/uL — ABNORMAL LOW (ref 0.7–4.0)
Monocytes Relative: 4.3 % (ref 3.0–12.0)
Neutro Abs: 11.1 10*3/uL — ABNORMAL HIGH (ref 1.4–7.7)
Platelets: 201 10*3/uL (ref 150.0–400.0)
WBC: 12.1 10*3/uL — ABNORMAL HIGH (ref 4.5–10.5)

## 2013-02-02 LAB — LIPID PANEL
LDL Cholesterol: 65 mg/dL (ref 0–99)
Total CHOL/HDL Ratio: 2

## 2013-02-02 LAB — BASIC METABOLIC PANEL
GFR: 58.12 mL/min — ABNORMAL LOW (ref >60.00)
Potassium: 4.6 mEq/L (ref 3.5–5.1)
Sodium: 133 mEq/L — ABNORMAL LOW (ref 135–145)

## 2013-02-02 LAB — HEPATIC FUNCTION PANEL
AST: 22 U/L (ref 0–37)
Albumin: 3.7 g/dL (ref 3.5–5.2)
Alkaline Phosphatase: 84 U/L (ref 39–117)
Bilirubin, Direct: 0.1 mg/dL (ref 0.0–0.3)

## 2013-02-02 LAB — TSH: TSH: 5.08 u[IU]/mL (ref 0.35–5.50)

## 2013-02-02 LAB — T4, FREE: Free T4: 1.27 ng/dL (ref 0.60–1.60)

## 2013-02-02 NOTE — Assessment & Plan Note (Signed)
Uses the hydrocodone regularly 

## 2013-02-02 NOTE — Assessment & Plan Note (Signed)
Exacerbation resolved No change needed in regular Rx

## 2013-02-02 NOTE — Assessment & Plan Note (Signed)
I have personally reviewed the Medicare Annual Wellness questionnaire and have noted 1. The patient's medical and social history 2. Their use of alcohol, tobacco or illicit drugs 3. Their current medications and supplements 4. The patient's functional ability including ADL's, fall risks, home safety risks and hearing or visual             impairment. 5. Diet and physical activities 6. Evidence for depression or mood disorders  The patients weight, height, BMI and visual acuity have been recorded in the chart I have made referrals, counseling and provided education to the patient based review of the above and I have provided the pt with a written personalized care plan for preventive services.  I have provided you with a copy of your personalized plan for preventive services. Please take the time to review along with your updated medication list.  No cancer screening needed Mild hearing issues and memory loss (but no formal schooling so doubt dementia at this point) UTD on imms

## 2013-02-02 NOTE — Progress Notes (Signed)
Subjective:    Patient ID: [name], male    DOB: Mar 26, 1928, 79 y.o.   MRN: [id number]  HPI Here for Medicare wellness No cigarettes or tobacco anymore. No alcohol Sees cardiologist and urologist Some hearing problems but gets along okay Vision okay--may need cataract surgery in time [facility] some for exercise No depression or anhedonia Several falls without injury---able to walk stably in general Independent with ADLs and instrumental ADLs Does note some problems with memory--misplaces things. Still handles finances  Feels his breathing is better Still a little cough--only occ sputum No wheezing now No fever  No trouble with heart No palpitations Just has some AM breathing problems---that clears up quickly No chest pain--no NTG needed No edema No dizziness or syncope  Voids okay Slow stream at times Nocturia x 1-2  Ongoing arthritis pain--especially in cold weather Uses the hydrocodone bid usually  Current Outpatient Prescriptions on File Prior to Visit  Medication Sig Dispense Refill  . albuterol (PROVENTIL HFA) 108 (90 BASE) MCG/ACT inhaler Inhale 2 puffs into the lungs every 4 (four) hours as needed for wheezing.  6 each  2  . amitriptyline (ELAVIL) 25 MG tablet Take 25 mg by mouth at bedtime.      Marland Kitchen aspirin 81 MG tablet Take 81 mg by mouth daily.        Marland Kitchen diltiazem (CARDIZEM CD) 180 MG 24 hr capsule Take 1 by mouth two times a day      . HYDROcodone-acetaminophen (NORCO/VICODIN) 5-325 MG per tablet Take 1 tablet by mouth 3 (three) times daily as needed.  90 tablet  0  . levothyroxine (SYNTHROID, LEVOTHROID) 25 MCG tablet TAKE 1 TABLET EVERY DAY  90 tablet  3  . nitroGLYCERIN (NITROSTAT) 0.4 MG SL tablet Place 1 tablet (0.4 mg total) under the tongue every 5 (five) minutes as needed for chest pain.  100 tablet  0  . pravastatin (PRAVACHOL) 20 MG tablet Take 20 mg by mouth daily.       . ramipril (ALTACE) 5 MG capsule Take 1 by mouth daily      . RAPAFLO 8 MG CAPS  Take 1 by mouth once daily      . SPIRIVA HANDIHALER 18 MCG inhalation capsule PLACE 1 CAPSULE INTO INHALER AND INHALE ONCE DAILY.  30 each  10  . warfarin (COUMADIN) 2.5 MG tablet Take one tab twice weekly or as directed  30 tablet  3  . warfarin (COUMADIN) 5 MG tablet USE AS DIRECTED  90 tablet  3   No current facility-administered medications on file prior to visit.    Allergies  Allergen Reactions  . Atorvastatin     REACTION: unspecified  . Ezetimibe-Simvastatin     REACTION: unspecified  . Fenofibrate     REACTION: "ran me up a wall"  . Penicillins     REACTION: unspecified    Past Medical History  Diagnosis Date  . Allergy   . CAD (coronary artery disease)   . Hyperlipidemia   . Kidney stones   . BPH (benign prostatic hypertrophy)   . Osteoarthritis   . COPD (chronic obstructive pulmonary disease)   . Hypothyroidism   . Atrial fibrillation   . Impaired fasting glucose     Past Surgical History  Procedure Laterality Date  . Inguinal hernia repair  09/2004    left  . Back surgery  1990's    x3  . Transurethral resection of prostate  2002  . Pleural scarification  1980  right  . Esophagogastroduodenoscopy  04/2002    negative  . Bladder stone removal  06/2006    Family History  Problem Relation Age of Onset  . Heart disease Mother   . Heart disease Brother   . Stroke Brother   . Cancer Neg Hx   . Diabetes Neg Hx   . Hypertension Neg Hx   . Heart disease Brother   . Stroke Brother   . Heart disease Brother   . Stroke Brother     History   Social History  . Marital Status: Married    Spouse Name: N/A    Number of Children: 1  . Years of Education: N/A   Occupational History  . retired Therapist, music    Social History Main Topics  . Smoking status: Former Smoker    Types: Cigarettes    Quit date: 12/23/2001  . Smokeless tobacco: Never Used  . Alcohol Use: No  . Drug Use: No  . Sexually Active: Not on file   Other Topics Concern  . Not  on file   Social History Narrative   Does lots of yard work and a big garden   Has living will   No [place] health care POA. Asks for daughter Ardeen Fillers to make decisions if needed.   Discussed DNR--- he doesn't want artificial ventilation but isn't clear about this.   Not sure about tube feeds   Review of Systems Appetite is fine Weight up a few pounds now in winter Sleeps well Bowels are slow---has to take MOM regularly. This works. Metamucil not effective     Objective:   Physical Exam  Constitutional: He is oriented to person, place, and time. He appears well-developed and well-nourished. No distress.  HENT:  Mouth/Throat: Oropharynx is clear and moist. No oropharyngeal exudate.  Neck: Normal range of motion. Neck supple. No thyromegaly present.  Cardiovascular: Normal rate, normal heart sounds and intact distal pulses.  Exam reveals no gallop.   No murmur heard. Irregular Faint pedal pulses  Pulmonary/Chest: Effort normal. No respiratory distress. He has no wheezes. He has no rales.  Decreased breath sounds but clear  Abdominal: Soft. There is no tenderness.  Musculoskeletal: He exhibits no edema and no tenderness.  Lymphadenopathy:    He has no cervical adenopathy.  Neurological: He is alert and oriented to person, place, and time.  President-- "Obama, [place], [place]" [phone] D-l--o-w (didn't think he could do it) Recall 2/3  Skin: No rash noted. No erythema.  Mycotic toenails  Psychiatric: He has a normal mood and affect. His behavior is normal.          Assessment & Plan:

## 2013-02-02 NOTE — Assessment & Plan Note (Signed)
Due for labs

## 2013-02-02 NOTE — Assessment & Plan Note (Signed)
Has been quiet Still sees cardiologist

## 2013-02-02 NOTE — Assessment & Plan Note (Signed)
Rate is fine Remains on coumadin with appropriate monitoring

## 2013-02-08 ENCOUNTER — Encounter: Payer: Self-pay | Admitting: *Deleted

## 2013-02-08 ENCOUNTER — Other Ambulatory Visit: Payer: Self-pay | Admitting: Internal Medicine

## 2013-02-18 ENCOUNTER — Ambulatory Visit (INDEPENDENT_AMBULATORY_CARE_PROVIDER_SITE_OTHER): Payer: Medicare Other | Admitting: General Practice

## 2013-02-18 DIAGNOSIS — Z7901 Long term (current) use of anticoagulants: Secondary | ICD-10-CM

## 2013-02-18 DIAGNOSIS — Z5181 Encounter for therapeutic drug level monitoring: Secondary | ICD-10-CM

## 2013-02-18 NOTE — Patient Instructions (Signed)
Please take 2.5 mg on Saturday and then continue to take 5 mg daily , except 2.5 mg Tues/Thurs, check in 4 weeks

## 2013-02-21 ENCOUNTER — Encounter (HOSPITAL_COMMUNITY): Payer: Self-pay

## 2013-02-21 ENCOUNTER — Inpatient Hospital Stay (HOSPITAL_COMMUNITY)
Admission: EM | Admit: 2013-02-21 | Discharge: 2013-02-25 | DRG: 291 | Disposition: A | Discharge status: Home Health | Payer: Medicare Other | Attending: Internal Medicine | Admitting: Internal Medicine

## 2013-02-21 ENCOUNTER — Emergency Department (HOSPITAL_COMMUNITY): Payer: Medicare Other

## 2013-02-21 DIAGNOSIS — Z9861 Coronary angioplasty status: Secondary | ICD-10-CM

## 2013-02-21 DIAGNOSIS — N189 Chronic kidney disease, unspecified: Secondary | ICD-10-CM

## 2013-02-21 DIAGNOSIS — E039 Hypothyroidism, unspecified: Secondary | ICD-10-CM | POA: Diagnosis present

## 2013-02-21 DIAGNOSIS — Z823 Family history of stroke: Secondary | ICD-10-CM

## 2013-02-21 DIAGNOSIS — I2510 Atherosclerotic heart disease of native coronary artery without angina pectoris: Secondary | ICD-10-CM | POA: Diagnosis present

## 2013-02-21 DIAGNOSIS — I4891 Unspecified atrial fibrillation: Secondary | ICD-10-CM | POA: Diagnosis present

## 2013-02-21 DIAGNOSIS — Z7982 Long term (current) use of aspirin: Secondary | ICD-10-CM

## 2013-02-21 DIAGNOSIS — J90 Pleural effusion, not elsewhere classified: Secondary | ICD-10-CM | POA: Diagnosis present

## 2013-02-21 DIAGNOSIS — R0902 Hypoxemia: Secondary | ICD-10-CM | POA: Diagnosis present

## 2013-02-21 DIAGNOSIS — Z8249 Family history of ischemic heart disease and other diseases of the circulatory system: Secondary | ICD-10-CM

## 2013-02-21 DIAGNOSIS — J449 Chronic obstructive pulmonary disease, unspecified: Secondary | ICD-10-CM | POA: Diagnosis present

## 2013-02-21 DIAGNOSIS — N179 Acute kidney failure, unspecified: Secondary | ICD-10-CM | POA: Diagnosis present

## 2013-02-21 DIAGNOSIS — Z880 Allergy status to penicillin: Secondary | ICD-10-CM

## 2013-02-21 DIAGNOSIS — I509 Heart failure, unspecified: Secondary | ICD-10-CM | POA: Diagnosis present

## 2013-02-21 DIAGNOSIS — J4489 Other specified chronic obstructive pulmonary disease: Secondary | ICD-10-CM | POA: Diagnosis present

## 2013-02-21 DIAGNOSIS — I5021 Acute systolic (congestive) heart failure: Principal | ICD-10-CM | POA: Diagnosis present

## 2013-02-21 DIAGNOSIS — Z7901 Long term (current) use of anticoagulants: Secondary | ICD-10-CM

## 2013-02-21 DIAGNOSIS — Z87891 Personal history of nicotine dependence: Secondary | ICD-10-CM

## 2013-02-21 DIAGNOSIS — Z79899 Other long term (current) drug therapy: Secondary | ICD-10-CM

## 2013-02-21 DIAGNOSIS — J9600 Acute respiratory failure, unspecified whether with hypoxia or hypercapnia: Secondary | ICD-10-CM | POA: Diagnosis present

## 2013-02-21 DIAGNOSIS — E785 Hyperlipidemia, unspecified: Secondary | ICD-10-CM | POA: Diagnosis present

## 2013-02-21 DIAGNOSIS — Z888 Allergy status to other drugs, medicaments and biological substances status: Secondary | ICD-10-CM

## 2013-02-21 DIAGNOSIS — N183 Chronic kidney disease, stage 3 unspecified: Secondary | ICD-10-CM | POA: Diagnosis present

## 2013-02-21 LAB — PROTIME-INR
INR: 2.51 — ABNORMAL HIGH (ref 0.00–1.49)
Prothrombin Time: 25.9 seconds — ABNORMAL HIGH (ref 11.6–15.2)

## 2013-02-21 LAB — POCT I-STAT TROPONIN I: Troponin i, poc: 0 ng/mL (ref 0.00–0.08)

## 2013-02-21 LAB — POCT I-STAT, CHEM 8
BUN: 24 mg/dL — ABNORMAL HIGH (ref 6–23)
Chloride: 106 mEq/L (ref 96–112)
HCT: 35 % — ABNORMAL LOW (ref 39.0–52.0)
Sodium: 136 mEq/L (ref 135–145)
TCO2: 24 mmol/L (ref 0–100)

## 2013-02-21 MED ORDER — ONDANSETRON HCL 4 MG/2ML IJ SOLN: 4.0000 mg | Freq: Three times a day (TID) | INTRAMUSCULAR | Status: DC | PRN

## 2013-02-21 MED ORDER — SODIUM CHLORIDE 0.9 % IV SOLN
250.0000 mL | INTRAVENOUS | Status: DC | PRN
  Administered 2013-02-22 – 2013-02-24 (×2): 250 mL via INTRAVENOUS

## 2013-02-21 MED ORDER — NITROGLYCERIN 0.4 MG SL SUBL: 0.4000 mg | SUBLINGUAL_TABLET | SUBLINGUAL | Status: DC | PRN

## 2013-02-21 MED ORDER — ASPIRIN 81 MG PO CHEW
81.0000 mg | CHEWABLE_TABLET | Freq: Every morning | ORAL | Status: DC
  Administered 2013-02-22 – 2013-02-25 (×4): 81 mg via ORAL
  Filled 2013-02-21 (×8): qty 1

## 2013-02-21 MED ORDER — SODIUM CHLORIDE 0.9 % IJ SOLN
3.0000 mL | INTRAMUSCULAR | Status: DC | PRN
  Administered 2013-02-24: 3 mL via INTRAVENOUS

## 2013-02-21 MED ORDER — TAMSULOSIN HCL 0.4 MG PO CAPS
0.4000 mg | ORAL_CAPSULE | Freq: Every day | ORAL | Status: DC
  Administered 2013-02-22 – 2013-02-25 (×4): 0.4 mg via ORAL
  Filled 2013-02-21 (×4): qty 1

## 2013-02-21 MED ORDER — ACETAMINOPHEN 325 MG PO TABS
650.0000 mg | ORAL_TABLET | ORAL | Status: DC | PRN
  Administered 2013-02-23: 650 mg via ORAL
  Filled 2013-02-21: qty 2

## 2013-02-21 MED ORDER — FUROSEMIDE 10 MG/ML IJ SOLN
40.0000 mg | Freq: Once | INTRAMUSCULAR | Status: AC
  Administered 2013-02-21: 40 mg via INTRAVENOUS
  Filled 2013-02-21: qty 4

## 2013-02-21 MED ORDER — ONDANSETRON HCL 4 MG/2ML IJ SOLN
4.0000 mg | Freq: Four times a day (QID) | INTRAMUSCULAR | Status: DC | PRN
  Administered 2013-02-22: 4 mg via INTRAVENOUS
  Filled 2013-02-21 (×2): qty 2

## 2013-02-21 MED ORDER — CARVEDILOL 3.125 MG PO TABS
3.1250 mg | ORAL_TABLET | Freq: Two times a day (BID) | ORAL | Status: DC
  Administered 2013-02-22 – 2013-02-25 (×8): 3.125 mg via ORAL
  Filled 2013-02-21 (×9): qty 1

## 2013-02-21 MED ORDER — FUROSEMIDE 10 MG/ML IJ SOLN
40.0000 mg | Freq: Two times a day (BID) | INTRAMUSCULAR | Status: DC
  Administered 2013-02-22 – 2013-02-25 (×7): 40 mg via INTRAVENOUS
  Filled 2013-02-21 (×9): qty 4

## 2013-02-21 MED ORDER — ENOXAPARIN SODIUM 40 MG/0.4ML ~~LOC~~ SOLN: 40.0000 mg | SUBCUTANEOUS | Status: DC

## 2013-02-21 MED ORDER — HYDROCODONE-ACETAMINOPHEN 5-325 MG PO TABS
1.0000 | ORAL_TABLET | Freq: Three times a day (TID) | ORAL | Status: DC | PRN
  Administered 2013-02-22 – 2013-02-25 (×7): 1 via ORAL
  Filled 2013-02-21 (×7): qty 1

## 2013-02-21 MED ORDER — DILTIAZEM HCL ER COATED BEADS 180 MG PO CP24
180.0000 mg | ORAL_CAPSULE | Freq: Every morning | ORAL | Status: DC
  Administered 2013-02-22: 180 mg via ORAL
  Filled 2013-02-21: qty 1

## 2013-02-21 MED ORDER — TIOTROPIUM BROMIDE MONOHYDRATE 18 MCG IN CAPS
18.0000 ug | ORAL_CAPSULE | Freq: Every day | RESPIRATORY_TRACT | Status: DC
  Administered 2013-02-22 – 2013-02-25 (×4): 18 ug via RESPIRATORY_TRACT
  Filled 2013-02-21 (×2): qty 5

## 2013-02-21 MED ORDER — LEVOFLOXACIN IN D5W 750 MG/150ML IV SOLN
750.0000 mg | INTRAVENOUS | Status: DC
  Administered 2013-02-22 – 2013-02-23 (×2): 750 mg via INTRAVENOUS
  Filled 2013-02-21 (×4): qty 150

## 2013-02-21 MED ORDER — LEVOTHYROXINE SODIUM 25 MCG PO TABS
25.0000 ug | ORAL_TABLET | Freq: Every day | ORAL | Status: DC
  Administered 2013-02-22: 25 ug via ORAL
  Filled 2013-02-21 (×2): qty 1

## 2013-02-21 MED ORDER — AMITRIPTYLINE HCL 25 MG PO TABS
25.0000 mg | ORAL_TABLET | Freq: Every day | ORAL | Status: DC
  Administered 2013-02-22 – 2013-02-24 (×4): 25 mg via ORAL
  Filled 2013-02-21 (×5): qty 1

## 2013-02-21 MED ORDER — ALBUTEROL SULFATE HFA 108 (90 BASE) MCG/ACT IN AERS: 2.0000 | INHALATION_SPRAY | RESPIRATORY_TRACT | Status: DC | PRN

## 2013-02-21 MED ORDER — SODIUM CHLORIDE 0.9 % IJ SOLN
3.0000 mL | Freq: Two times a day (BID) | INTRAMUSCULAR | Status: DC
  Administered 2013-02-22 – 2013-02-25 (×6): 3 mL via INTRAVENOUS

## 2013-02-21 NOTE — H&P (Signed)
[facility] History and Physical  Ziere Docken ZOX:[id number] DOB: [birth year] DOA: 02/21/2013  Referring physician: Dr. Radford Pax EDP PCP: Tillman Abide, MD  Specialists: Cardiologist: Dr. Donnie Aho  Chief Complaint: shortness of breath  HPI: [name] is a 79 y.o. male with a history of COPD, coronary artery disease with stent in past, atrial fibrillation. He presents to the emergency room with shortness of breath. He reports onset of symptoms for the past 2-3 weeks. They have progressively gotten worse. He does admit to orthopnea, paroxysmal nocturnal dyspnea, dyspnea on exertion. He also reports having a cough for the past few weeks which is productive of grayish sputum. He denies any fevers. He does occasionally have left-sided chest pain which appears to get worse when he walks or has a long coughing spell. He's not noticed any lower extremity edema, but feels that his abdomen may be somewhat swollen. He's not had any wheezing. He's not had any sick contacts or travel history. He's feeling increasingly weak. He was evaluated in the emergency room and was noted to have a left sided pleural effusion on chest x-ray. It was felt that he was in volume overload and the hospitalists were requested for admission.  Review of Systems: Pertinent positives as per HPI, otherwise negative  Past Medical History  Diagnosis Date  . Allergy   . CAD (coronary artery disease)   . Hyperlipidemia   . Kidney stones   . BPH (benign prostatic hypertrophy)   . Osteoarthritis   . COPD (chronic obstructive pulmonary disease)   . Hypothyroidism   . Atrial fibrillation   . Impaired fasting glucose    Past Surgical History  Procedure Laterality Date  . Inguinal hernia repair  09/2004    left  . Back surgery  1990's    x3  . Transurethral resection of prostate  2002  . Pleural scarification  1980    right  . Esophagogastroduodenoscopy  04/2002    negative  . Bladder stone removal  06/2006   Social  History:  reports that he quit smoking about 11 years ago. His smoking use included Cigarettes. He smoked 0.00 packs per day. He has never used smokeless tobacco. He reports that he does not drink alcohol or use illicit drugs. Lives at home with his wife  Allergies  Allergen Reactions  . Atorvastatin Other (See Comments)    REACTION: severe muscle aches  . Ezetimibe-Simvastatin Other (See Comments)    REACTION: severe muscle aches  . Fenofibrate     REACTION: "ran me up a wall"  . Penicillins Other (See Comments)    blisters    Family History  Problem Relation Age of Onset  . Heart disease Mother   . Heart disease Brother   . Stroke Brother   . Cancer Neg Hx   . Diabetes Neg Hx   . Hypertension Neg Hx   . Heart disease Brother   . Stroke Brother   . Heart disease Brother   . Stroke Brother     Prior to Admission medications   Medication Sig Start Date End Date Taking? Authorizing Hanaa Payes  albuterol (PROVENTIL HFA) 108 (90 BASE) MCG/ACT inhaler Inhale 2 puffs into the lungs every 4 (four) hours as needed for wheezing. 01/27/13  Yes Spencer Copland, MD  amitriptyline (ELAVIL) 25 MG tablet Take 25 mg by mouth at bedtime.   Yes Historical Hektor Huston, MD  aspirin 81 MG tablet Take 81 mg by mouth every morning.    Yes Historical Akeema Broder, MD  diltiazem (  CARDIZEM CD) 180 MG 24 hr capsule Take 180 mg by mouth every morning.  02/01/11  Yes Historical Landen Breeland, MD  HYDROcodone-acetaminophen (NORCO/VICODIN) 5-325 MG per tablet Take 1 tablet by mouth 3 (three) times daily as needed. For pain 01/25/13  Yes Karie Schwalbe, MD  levothyroxine (SYNTHROID, LEVOTHROID) 25 MCG tablet Take 25 mcg by mouth every morning.   Yes Historical Asar Evilsizer, MD  nitroGLYCERIN (NITROSTAT) 0.4 MG SL tablet Place 0.4 mg under the tongue every 5 (five) minutes as needed for chest pain. x3 doses for for chest pain 10/19/12  Yes Karie Schwalbe, MD  pravastatin (PRAVACHOL) 20 MG tablet Take 20 mg by mouth every morning.   07/06/12  Yes Historical Matvey Llanas, MD  ramipril (ALTACE) 5 MG capsule Take 5 mg by mouth every morning.  01/11/11  Yes Historical Blayke Cordrey, MD  RAPAFLO 8 MG CAPS Take 8 mg by mouth every morning.  01/11/11  Yes Historical Leela Vanbrocklin, MD  tiotropium (SPIRIVA) 18 MCG inhalation capsule Place 18 mcg into inhaler and inhale daily.   Yes Historical Armas Mcbee, MD  warfarin (COUMADIN) 2.5 MG tablet Take 2.5 mg by mouth 2 (two) times a week. Takes on Tues and Thurs in the morning 08/10/12  Yes Karie Schwalbe, MD  warfarin (COUMADIN) 5 MG tablet Take 5 mg by mouth See admin instructions. Take on Mon, Wed, Fri, Sat & Sun. Takes in the morning.   Yes Historical Ambika Zettlemoyer, MD   Physical Exam: Filed Vitals:   02/21/13 1726 02/21/13 1832 02/21/13 1900  BP: 118/75 120/61 120/66  Pulse: 98  87  Temp: 98.8 F (37.1 C)    TempSrc: Oral    Resp: 16 18 23   SpO2: 100% 100% 99%     General:  NAD  Eyes: PERRLA  ENT: mucous membranes are moist  Neck: supple, elevated JVD  Cardiovascular: S1, S2, irregular  Respiratory: air movement appreciated in all lung fields, no wheezing, no significant rhonchi/rales  Abdomen: soft, nt, nd, bs+  Skin: no rashes  Musculoskeletal: deferred  Psychiatric: normal affect, co operative with exam  Neurologic: grossly intact, non focal  Labs on Admission:  Basic Metabolic Panel:  Recent Labs Lab 02/21/13 1813  NA 136  K 4.9  CL 106  GLUCOSE 93  BUN 24*  CREATININE 1.60*   Liver Function Tests: No results found for this basename: AST, ALT, ALKPHOS, BILITOT, PROT, ALBUMIN,  in the last 168 hours No results found for this basename: LIPASE, AMYLASE,  in the last 168 hours No results found for this basename: AMMONIA,  in the last 168 hours CBC:  Recent Labs Lab 02/21/13 1813  HGB 11.9*  HCT 35.0*   Cardiac Enzymes: No results found for this basename: CKTOTAL, CKMB, CKMBINDEX, TROPONINI,  in the last 168 hours  BNP (last 3 results)  Recent Labs   02/21/13 1738  PROBNP 890.8*   CBG: No results found for this basename: GLUCAP,  in the last 168 hours  Radiological Exams on Admission: Dg Chest 2 View  02/21/2013  *RADIOLOGY REPORT*  Clinical Data: Shortness of breath, chest pain  CHEST - 2 VIEW  Comparison: 01/27/2011  Findings: Chronic interstitial markings/emphysematous changes. Moderate left pleural effusion.  Associated left lower lobe opacity, atelectasis versus pneumonia.  The heart is normal in size.  Mild degenerative changes of the visualized thoracolumbar spine.  IMPRESSION: Moderate left pleural effusion.  Associated left lower lobe opacity, atelectasis versus pneumonia.   Original Report Authenticated By: Charline Bills, M.D.  EKG: Independently reviewed. Atrial fibrillation  Assessment/Plan Active Problems:   HYPERLIPIDEMIA   CORONARY ARTERY DISEASE   Atrial fibrillation   COPD   HYPOTHYROIDISM   Pleural effusion, left   Acute CHF   1. Shortness of breath possibly due to acute CHF. Patient will be admitted to a telemetry unit. We will start him on intravenous Lasix and monitor daily weights as well as ins and outs. We will obtain a 2-D echocardiogram. His BN peptide has increased from 138 on 2/6 to 890 on admission.we will check TSH cardiac markers. If the shortness of breath/pleural effusion do not improve diuresis, it would be reasonable to request a thoracentesis. It may also be beneficial to evaluate the patient with a CT scan of the chest to rule out any underlying malignancy. This will be reevaluated as his condition evolves.Chest x-ray also indicates atelectasis versus pneumonia. Patient will be started empirically on Levaquin for now. We will check a complete CBC to evaluate for any leukocytosis. If the patient does not have any fevers or other clinical signs of pneumonia, then Levaquin may be discontinued. 2. Renal insufficiency. Patient does have a minor bump in his creatinine. We will hold his ACE inhibitor  for now. Continue diuretics. He does not appear to be dehydrated. 3. Hypothyroidism. Continue levothyroxine check TSH 4. Atrial fibrillation. Rate controlled on diltiazem. He is anticoagulated with Coumadin. We will hold Coumadin for now in case he needs any thoracentesis. 5. Coronary artery disease. Continue aspirin. Follow cardiac markers. 6. COPD. No element of wheezing at this point. We'll continue when necessary bronchodilators.  Code Status: Patient does wish to receive chest compressions/defibrillation, he does not wish to be intubated Family Communication: discussed with patient, wife and children at bedside Disposition Plan: discharge home once improved  Time spent:  MEMON,JEHANZEB [facility] Pager [phone]  If 7PM-7AM, please contact night-coverage [email or web address] Password [facility] 02/21/2013, 8:02 PM

## 2013-02-21 NOTE — ED Notes (Signed)
Attempted to call report.  RN receiving pt is Consulting civil engineer and will call back.

## 2013-02-21 NOTE — ED Notes (Signed)
Attempted to call report.  Bed still listed as "cleaning"- RN to call back

## 2013-02-21 NOTE — ED Provider Notes (Signed)
History     CSN: [id number]  Arrival date & time 02/21/13  1724   First MD Initiated Contact with Patient 02/21/13 1725      Chief Complaint  Patient presents with  . Shortness of Breath     HPI  Per ems- pt c/o inc sob x3wks. Pt has sob per baseline, but he is having more with exertion than normal. RA sats 94%. Pt also having inc in productive cough and generalized pain. Denies cp. 12 lead unremarkable, Afib, hx of same. Bp-131/70 HR-80-100 R-20       Past Medical History  Diagnosis Date  . Allergy   . CAD (coronary artery disease)   . Hyperlipidemia   . Kidney stones   . BPH (benign prostatic hypertrophy)   . Osteoarthritis   . COPD (chronic obstructive pulmonary disease)   . Hypothyroidism   . Atrial fibrillation   . Impaired fasting glucose     Past Surgical History  Procedure Laterality Date  . Inguinal hernia repair  09/2004    left  . Back surgery  1990's    x3  . Transurethral resection of prostate  2002  . Pleural scarification  1980    right  . Esophagogastroduodenoscopy  04/2002    negative  . Bladder stone removal  06/2006    Family History  Problem Relation Age of Onset  . Heart disease Mother   . Heart disease Brother   . Stroke Brother   . Cancer Neg Hx   . Diabetes Neg Hx   . Hypertension Neg Hx   . Heart disease Brother   . Stroke Brother   . Heart disease Brother   . Stroke Brother     History  Substance Use Topics  . Smoking status: Former Smoker    Types: Cigarettes    Quit date: 12/23/2001  . Smokeless tobacco: Never Used  . Alcohol Use: No      Review of Systems All other systems reviewed and are negative Allergies  Atorvastatin; Ezetimibe-simvastatin; Fenofibrate; and Penicillins  Home Medications   Current Outpatient Rx  Name  Route  Sig  Dispense  Refill  . albuterol (PROVENTIL HFA) 108 (90 BASE) MCG/ACT inhaler   Inhalation   Inhale 2 puffs into the lungs every 4 (four) hours as needed for wheezing.   6  each   2   . amitriptyline (ELAVIL) 25 MG tablet   Oral   Take 25 mg by mouth at bedtime.         Marland Kitchen aspirin 81 MG tablet   Oral   Take 81 mg by mouth every morning.          . diltiazem (CARDIZEM CD) 180 MG 24 hr capsule   Oral   Take 180 mg by mouth every morning.          Marland Kitchen HYDROcodone-acetaminophen (NORCO/VICODIN) 5-325 MG per tablet   Oral   Take 1 tablet by mouth 3 (three) times daily as needed. For pain         . levothyroxine (SYNTHROID, LEVOTHROID) 25 MCG tablet   Oral   Take 25 mcg by mouth every morning.         . nitroGLYCERIN (NITROSTAT) 0.4 MG SL tablet   Sublingual   Place 0.4 mg under the tongue every 5 (five) minutes as needed for chest pain. x3 doses for for chest pain         . pravastatin (PRAVACHOL) 20 MG tablet   Oral  Take 20 mg by mouth every morning.          . ramipril (ALTACE) 5 MG capsule   Oral   Take 5 mg by mouth every morning.          Marland Kitchen RAPAFLO 8 MG CAPS   Oral   Take 8 mg by mouth every morning.          . tiotropium (SPIRIVA) 18 MCG inhalation capsule   Inhalation   Place 18 mcg into inhaler and inhale daily.         Marland Kitchen warfarin (COUMADIN) 2.5 MG tablet   Oral   Take 2.5 mg by mouth 2 (two) times a week. Takes on Tues and Thurs in the morning         . warfarin (COUMADIN) 5 MG tablet   Oral   Take 5 mg by mouth See admin instructions. Take on Mon, Wed, Fri, Sat & Sun. Takes in the morning.           BP 120/66  Pulse 87  Temp(Src) 98.8 F (37.1 C) (Oral)  Resp 23  SpO2 99%  Physical Exam  Nursing note and vitals reviewed. Constitutional: He is oriented to person, place, and time. He appears well-developed and well-nourished. No distress.  HENT:  Head: Normocephalic and atraumatic.  Eyes: Pupils are equal, round, and reactive to light.  Neck: Normal range of motion. JVD present.  Cardiovascular: Normal rate and intact distal pulses.   Pulmonary/Chest: No respiratory distress. He has rales.   Abdominal: Normal appearance. He exhibits no distension.  Musculoskeletal: Normal range of motion.  Neurological: He is alert and oriented to person, place, and time. No cranial nerve deficit.  Skin: Skin is warm and dry. No rash noted.  Psychiatric: He has a normal mood and affect. His behavior is normal.    ED Course  Procedures (including critical care time)  Date: 02/21/2013  Rate: 98  Rhythm: Atrial fibrillation  QRS Axis: normal  Intervals: normal  ST/T Wave abnormalities: Borderline T wave abnormality  Conduction Disutrbances: none  Narrative Interpretation: Abnormal EKG  Meds given in the ED: furosemide (LASIX) injection 40 mg Once     CRITICAL CARE Performed by: Nelva Nay L   Total critical care time: 30 min  Critical care time was exclusive of separately billable procedures and treating other patients.  Critical care was necessary to treat or prevent imminent or life-threatening deterioration.  Critical care was time spent personally by me on the following activities: development of treatment plan with patient and/or surrogate as well as nursing, discussions with consultants, evaluation of patient's response to treatment, examination of patient, obtaining history from patient or surrogate, ordering and performing treatments and interventions, ordering and review of laboratory studies, ordering and review of radiographic studies, pulse oximetry and re-evaluation of patient's condition.  Labs Reviewed  PRO B NATRIURETIC PEPTIDE - Abnormal; Notable for the following:    Pro B Natriuretic peptide (BNP) 890.8 (*)    All other components within normal limits  PROTIME-INR - Abnormal; Notable for the following:    Prothrombin Time 25.9 (*)    INR 2.51 (*)    All other components within normal limits  POCT I-STAT, CHEM 8 - Abnormal; Notable for the following:    BUN 24 (*)    Creatinine, Ser 1.60 (*)    Hemoglobin 11.9 (*)    HCT 35.0 (*)    All other components  within normal limits  POCT I-STAT TROPONIN I  Dg Chest 2 View  02/21/2013  *RADIOLOGY REPORT*  Clinical Data: Shortness of breath, chest pain  CHEST - 2 VIEW  Comparison: 01/27/2011  Findings: Chronic interstitial markings/emphysematous changes. Moderate left pleural effusion.  Associated left lower lobe opacity, atelectasis versus pneumonia.  The heart is normal in size.  Mild degenerative changes of the visualized thoracolumbar spine.  IMPRESSION: Moderate left pleural effusion.  Associated left lower lobe opacity, atelectasis versus pneumonia.   Original Report Authenticated By: Charline Bills, M.D.      1. Congestive heart failure (CHF)   2. Renal insufficiency, mild       MDM         Nelia Shi, MD 02/21/13 [phone]

## 2013-02-21 NOTE — ED Notes (Signed)
Per ems- pt c/o inc sob x3wks. Pt has sob per baseline, but he is having more with exertion than normal. RA sats 94%. Pt also having inc in productive cough and generalized pain. Denies cp. 12 lead unremarkable, Afib, hx of same. Bp-131/70 HR-80-100 R-20

## 2013-02-22 DIAGNOSIS — J9601 Acute respiratory failure with hypoxia: Secondary | ICD-10-CM | POA: Diagnosis present

## 2013-02-22 DIAGNOSIS — R0902 Hypoxemia: Secondary | ICD-10-CM | POA: Diagnosis present

## 2013-02-22 LAB — COMPREHENSIVE METABOLIC PANEL
ALT: 15 U/L (ref 0–53)
AST: 18 U/L (ref 0–37)
Alkaline Phosphatase: 93 U/L (ref 39–117)
CO2: 25 mEq/L (ref 19–32)
GFR calc Af Amer: 45 mL/min — ABNORMAL LOW (ref >90)
Glucose, Bld: 76 mg/dL (ref 70–99)
Potassium: 4.2 mEq/L (ref 3.5–5.1)
Sodium: 136 mEq/L (ref 135–145)
Total Protein: 7.3 g/dL (ref 6.0–8.3)

## 2013-02-22 LAB — PRO B NATRIURETIC PEPTIDE: Pro B Natriuretic peptide (BNP): 1108 pg/mL — ABNORMAL HIGH (ref 0–450)

## 2013-02-22 LAB — CBC
Hemoglobin: 12.9 g/dL — ABNORMAL LOW (ref 13.0–17.0)
Platelets: 208 10*3/uL (ref 150–400)
RBC: 4.32 MIL/uL (ref 4.22–5.81)

## 2013-02-22 MED ORDER — DILTIAZEM HCL 100 MG IV SOLR
5.0000 mg/h | INTRAVENOUS | Status: DC
  Administered 2013-02-22: 5 mg/h via INTRAVENOUS
  Filled 2013-02-22: qty 100

## 2013-02-22 MED ORDER — LEVOTHYROXINE SODIUM 50 MCG PO TABS
50.0000 ug | ORAL_TABLET | Freq: Every day | ORAL | Status: DC
  Administered 2013-02-23 – 2013-02-25 (×3): 50 ug via ORAL
  Filled 2013-02-22 (×4): qty 1

## 2013-02-22 MED ORDER — DILTIAZEM HCL 100 MG IV SOLR
8.0000 mg/h | INTRAVENOUS | Status: DC
  Administered 2013-02-22: 5 mg/h via INTRAVENOUS
  Administered 2013-02-22 – 2013-02-24 (×4): 8 mg/h via INTRAVENOUS
  Filled 2013-02-22 (×7): qty 100

## 2013-02-22 NOTE — Progress Notes (Addendum)
Patient ID: [name], male   DOB: Jan 04, 1928, 79 y.o.   MRN: [id number]  [facility] PROGRESS NOTE  [name] WUJ:[id number] DOB: 1928/08/04 DOA: 02/21/2013 PCP: Tillman Abide, MD  Brief narrative: Pt is 79 y.o. male with a history of COPD, coronary artery disease with stent in past, atrial fibrillation, presented to [facility] ED with progressively worsening shortness of breath that initially started 2-3 weeks prior to this admission, associated with productive cough of clear - gray sputum, left sided chest pain, 3 pillow orthopnea, chills, generalized weakness. Pt denies similar events in the past. In ED, CXR findings indicating left sided pleural effusion, ? PNA. [facility] asked to admit to telemetry bed.    Principal Problem:   Acute respiratory failure - secondary to left side pleural effusion and  PNA - continue empiric ABX, sputum analysis pending - PCCM for advice on pleural effusion and ? Need for thoracentesis - CT chest for further evaluation  Active Problems:   Atrial fibrillation with RVR - continue low dose coreg as per home medication regimen - add Cardizem drip, continue to hold Coumadin in an anticipation of ? Thoracentesis  - home medications include Cardizem and Coreg, Cardizem PO not ordered on admission, unclear why - may be possible to transition back to PO once HR better controlled  - cardiology called   COPD - nebulizers as needed   Pleural effusion, left - unclear etiology, will ask PCCM for advice  - CT chest pending as well as 2 D ECHO - continue Lasix for now   Acute CHF, ? diastolic  - unclear if pt has history of CHF, no recent 2 D ECHO in the system - will ask cardiology for input - repeat 2 D ECHO - daily weights, I's and O's, continue Lasix    Acute on chronic renal failure - based on GFR, pt with chronic kidney disease stage II - III - will monitor renal function closely as pt on Lasix IV - strict I's and O's - hold ACEI, Ramipril   HYPOTHYROIDISM - TSH elevated - plan on increasing the dose of synthroid to 50 mcg   CORONARY ARTERY DISEASE - contiue Aspirin   Consultants:  Cardiology  PCCM  Procedures/Studies:  Dg Chest 2 View 02/21/2013  --> Moderate left pleural effusion.  Associated left lower lobe opacity, atelectasis versus pneumonia.    Antibiotics:  Levaquin 03/02 -->  Code Status: Partial code, no intubation  Family Communication: Pt at bedside, no other family member at the bedside  Disposition Plan: PT evaluation when patient able to participate   HPI/Subjective: No events overnight.   Objective: Filed Vitals:   02/21/13 2232 02/22/13 0228 02/22/13 0519 02/22/13 0819  BP: 127/82 115/63 138/86   Pulse: 117 96 117   Temp: 97.4 F (36.3 C)  97 F (36.1 C)   TempSrc: Oral  Oral   Resp: 20 18 18    Height: 5\' 7"  (1.702 m)     Weight: 126 lb 6.4 oz (57.335 kg)     SpO2: 99% 98% 100% 99%    Intake/Output Summary (Last 24 hours) at 02/22/13 1251 Last data filed at 02/22/13 1140  Gross per 24 hour  Intake    513 ml  Output   3375 ml  Net  -2862 ml    Exam:   General:  Pt is alert, follows commands appropriately, not in acute distress  Cardiovascular: Irregular rhythm, tachycardic, no rubs, no gallops  Respiratory: Decreased breath sounds on the left side >  right side, no wheezing  Abdomen: Soft, non tender, non distended, bowel sounds present, no guarding  Extremities: No edema, pulses DP and PT palpable bilaterally  Neuro: Grossly nonfocal  Data Reviewed: Basic Metabolic Panel:  Recent Labs Lab 02/21/13 1813 02/22/13 0135  NA 136 136  K 4.9 4.2  CL 106 100  CO2  --  25  GLUCOSE 93 76  BUN 24* 25*  CREATININE 1.60* 1.59*  CALCIUM  --  9.7   Liver Function Tests:  Recent Labs Lab 02/22/13 0135  AST 18  ALT 15  ALKPHOS 93  BILITOT 0.4  PROT 7.3  ALBUMIN 3.5   CBC:  Recent Labs Lab 02/21/13 1813 02/22/13 0135  WBC  --  6.8  HGB 11.9* 12.9*  HCT 35.0*  38.7*  MCV  --  89.6  PLT  --  208   Cardiac Enzymes:  Recent Labs Lab 02/22/13 0042 02/22/13 0758  TROPONINI <0.30 <0.30   Scheduled Meds: . amitriptyline  25 mg Oral QHS  . aspirin  81 mg Oral q morning - 10a  . carvedilol  3.125 mg Oral BID WC  . furosemide  40 mg Intravenous BID  . levofloxacin IV  750 mg Intravenous Q48H  . levothyroxine  25 mcg Oral QAC breakfast  . tamsulosin  0.4 mg Oral QPC supper  . tiotropium  18 mcg Inhalation Daily   Continuous Infusions: . diltiazem (CARDIZEM) infusion       Debbora Presto, MD  [facility] Pager [phone]  If 7PM-7AM, please contact night-coverage [email or web address] Password [email or web address] 02/22/2013, 12:51 PM   LOS: 1 day

## 2013-02-22 NOTE — Consult Note (Signed)
Admit date: 02/21/2013 Referring Physician  Hospitalist Primary Physician  Alphonsus Sias, MD Primary Cardiologist  Dahlia Client, MD Reason for Consultation  Dyspnea and atrial fibrillation  ASSESSMENT: 1. Atrial fibrillation with RVR. H/o PAF dating to 2009. Coumadin started 2012 and it is unclear if he has continuous or paroxysmal AF.  2. Moderate left pleural effusion, uncertain cause. Doubt heart failure related. Rule out malignancy.  3. COPD  4. CAD with OM stent 2003  5. Prostatic hypertrophy  PLAN:  1. Diltiazem to control ventricular response rate to atrial fibrillation  2. Check BNP level  3. 2-D Doppler echocardiogram  4. Diagnostic thoracentesis   HPI: The patient is a 2 to three-week history of progressive shortness of breath. He feels as though his "COPD" his acting out. His appetite has been good. He denies weight loss, cough, hemoptysis, palpitations, no syncope. He is unable to tell me whether he has continuous atrial fibrillation since 2012 from Coumadin was started, or if it is been paroxysmal. I cannot tell from the old records. He has a history of coronary artery disease. He has not been having chest pain. He had a stent placed in the first obtuse marginal branch and 2003. Repeat catheterization in 2008 demonstrated 50% calcified LAD, 30% ISR in the OM, and 70% calcified RCA. EF was 60% at that time. If echocardiogram data is available it is not currently present in [facility]. He denies peripheral edema. He denies palpitations and has not had syncope to   PMH:   Past Medical History  Diagnosis Date  . Allergy   . CAD (coronary artery disease)   . Hyperlipidemia   . Kidney stones   . BPH (benign prostatic hypertrophy)   . Osteoarthritis   . COPD (chronic obstructive pulmonary disease)   . Hypothyroidism   . Atrial fibrillation   . Impaired fasting glucose      PSH:   Past Surgical History  Procedure Laterality Date  . Inguinal hernia repair  09/2004   left  . Back surgery  1990's    x3  . Transurethral resection of prostate  2002  . Pleural scarification  1980    right  . Esophagogastroduodenoscopy  04/2002    negative  . Bladder stone removal  06/2006    Allergies:  Atorvastatin; Ezetimibe-simvastatin; Fenofibrate; and Penicillins Prior to Admit Meds:   Prescriptions prior to admission  Medication Sig Dispense Refill  . albuterol (PROVENTIL HFA) 108 (90 BASE) MCG/ACT inhaler Inhale 2 puffs into the lungs every 4 (four) hours as needed for wheezing.  6 each  2  . amitriptyline (ELAVIL) 25 MG tablet Take 25 mg by mouth at bedtime.      Marland Kitchen aspirin 81 MG tablet Take 81 mg by mouth every morning.       . diltiazem (CARDIZEM CD) 180 MG 24 hr capsule Take 180 mg by mouth every morning.       Marland Kitchen HYDROcodone-acetaminophen (NORCO/VICODIN) 5-325 MG per tablet Take 1 tablet by mouth 3 (three) times daily as needed. For pain      . levothyroxine (SYNTHROID, LEVOTHROID) 25 MCG tablet Take 25 mcg by mouth every morning.      . nitroGLYCERIN (NITROSTAT) 0.4 MG SL tablet Place 0.4 mg under the tongue every 5 (five) minutes as needed for chest pain. x3 doses for for chest pain      . pravastatin (PRAVACHOL) 20 MG tablet Take 20 mg by mouth every morning.       . ramipril (ALTACE)  5 MG capsule Take 5 mg by mouth every morning.       Marland Kitchen RAPAFLO 8 MG CAPS Take 8 mg by mouth every morning.       . tiotropium (SPIRIVA) 18 MCG inhalation capsule Place 18 mcg into inhaler and inhale daily.      Marland Kitchen warfarin (COUMADIN) 2.5 MG tablet Take 2.5 mg by mouth 2 (two) times a week. Takes on Tues and Thurs in the morning      . warfarin (COUMADIN) 5 MG tablet Take 5 mg by mouth See admin instructions. Take on Mon, Wed, Fri, Sat & Sun. Takes in the morning.       Fam HX:    Family History  Problem Relation Age of Onset  . Heart disease Mother   . Heart disease Brother   . Stroke Brother   . Cancer Neg Hx   . Diabetes Neg Hx   . Hypertension Neg Hx   . Heart disease  Brother   . Stroke Brother   . Heart disease Brother   . Stroke Brother    Social HX:    History   Social History  . Marital Status: Married    Spouse Name: N/A    Number of Children: 1  . Years of Education: N/A   Occupational History  . retired Therapist, music    Social History Main Topics  . Smoking status: Former Smoker    Types: Cigarettes    Quit date: 12/23/2001  . Smokeless tobacco: Never Used  . Alcohol Use: No  . Drug Use: No  . Sexually Active: Not on file   Other Topics Concern  . Not on file   Social History Narrative   Does lots of yard work and a big garden   Has living will   No [place] health care POA. Asks for daughter Ardeen Fillers to make decisions if needed.   Discussed DNR--- he doesn't want artificial ventilation but isn't clear about this.   Not sure about tube feeds     Review of Systems: She denies alcohol use, does not currently smoke, has no history of cancer, is diabetic, no history of CVA, and has had a good appetite with a stable weight. He has not noticed blood in his urine or stool. No recent falls or head trauma . No change in vision. He denies dysphagia.  Physical Exam: Blood pressure 117/66, pulse 118, temperature 97 F (36.1 C), temperature source Oral, resp. rate 18, height 5\' 7"  (1.702 m), weight 57.335 kg (126 lb 6.4 oz), SpO2 99.00%. Weight change:    GENERAL appearance is that of a somewhat frail elderly gentleman who is in no acute distress lying at 47 watching television.  HEENT exam reveals no Johns is, neck veins are relatively flat, and carotid bruits or not audible.  CHEST exam reveals diminished breath sounds on the left with faint rales in the mid lung fields. The right lung is clear.  CARDIAC exam reveals normal S1 is 2, irregularly irregular, rapid rhythm. No murmur.  ABDOMINAL exam is grossly unremarkable. Liver and spleen not palpable. Bowel sounds are normal.  EXTREMITIES reveal no edema. Posterior tibial and radial pulses  are 2+ and symmetric.  NEUROLOGICAL exam reveals decreased memory. He is able to carry on adequate conversation. No gross cranial nerves or motor deficits and noted. Labs:   Lab Results  Component Value Date   WBC 6.8 02/22/2013   HGB 12.9* 02/22/2013   HCT 38.7* 02/22/2013   MCV  89.6 02/22/2013   PLT 208 02/22/2013    Recent Labs Lab 02/22/13 0135  NA 136  K 4.2  CL 100  CO2 25  BUN 25*  CREATININE 1.59*  CALCIUM 9.7  PROT 7.3  BILITOT 0.4  ALKPHOS 93  ALT 15  AST 18  GLUCOSE 76   No results found for this basename: PTT   Lab Results  Component Value Date   INR 2.46* 02/22/2013   INR 2.51* 02/21/2013   INR 3.1 02/18/2013   Lab Results  Component Value Date   CKTOTAL 57 11/17/2008   CKMB 1.8 11/17/2008   TROPONINI <0.30 02/22/2013     Lab Results  Component Value Date   CHOL 158 02/02/2013   CHOL 230* 01/21/2012   CHOL  Value: 158        ATP III CLASSIFICATION:  <200     mg/dL   Desirable  161-096  mg/dL   Borderline High  >=045    mg/dL   High 40/98/1191   Lab Results  Component Value Date   HDL 65.60 02/02/2013   HDL 47.82 01/21/2012   HDL 34* 11/17/2008   Lab Results  Component Value Date   LDLCALC 65 02/02/2013   LDLCALC  Value: 108        Total Cholesterol/HDL:CHD Risk Coronary Heart Disease Risk Table                     Men   Women  1/2 Average Risk   3.4   3.3* 11/17/2008   LDLCALC 136* 04/20/2008   Lab Results  Component Value Date   TRIG 137.0 02/02/2013   TRIG 129.0 01/21/2012   TRIG 78 11/17/2008   Lab Results  Component Value Date   CHOLHDL 2 02/02/2013   CHOLHDL 4 01/21/2012   CHOLHDL 4.6 11/17/2008   Lab Results  Component Value Date   LDLDIRECT 148.3 01/21/2012      Radiology:  Dg Chest 2 View  02/21/2013  *RADIOLOGY REPORT*  Clinical Data: Shortness of breath, chest pain  CHEST - 2 VIEW  Comparison: 01/27/2011  Findings: Chronic interstitial markings/emphysematous changes. Moderate left pleural effusion.  Associated left lower lobe opacity,  atelectasis versus pneumonia.  The heart is normal in size.  Mild degenerative changes of the visualized thoracolumbar spine.  IMPRESSION: Moderate left pleural effusion.  Associated left lower lobe opacity, atelectasis versus pneumonia.   Original Report Authenticated By: Charline Bills, M.D.    EKG:  Atrial fibrillation with RVR    Veatrice Kells W 02/22/2013 2:50 PM

## 2013-02-22 NOTE — Progress Notes (Signed)
Utilization Review Completed.   Kimberly Tucker, RN, BSN Nurse Case Manager  [phone]  

## 2013-02-22 NOTE — Consult Note (Signed)
PULMONARY  / CRITICAL CARE MEDICINE  Name: [name] MRN: [id number] DOB: 1928/04/29    ADMISSION DATE:  02/21/2013 CONSULTATION DATE: 3/3  REFERRING MD :  Izola Price   CHIEF COMPLAINT:  Pleural effusion   BRIEF PATIENT DESCRIPTION:  79 year old male w/ multiple co-morbids. Admitted on 3/2 w/ progressive dyspnea, orthopnea, pleuritic left sided chest pain and cough but no fever. Found to have left pleural effusion on admit. PCCM asked to eval re: effusion.    PMH  COPD, coronary artery disease with stent in past, atrial fibrillation (on chronic coumadin)  SIGNIFICANT EVENTS / STUDIES:  CT chest 3/3>>> ECHO 3/3>>>  LINES / TUBES:  CULTURES: Sputum 3/2>>> ANTIBIOTICS: levaquin 3/2>>>  HISTORY OF PRESENT ILLNESS:   [name] is a 79 y.o. male with a history of COPD, coronary artery disease with stent in past, atrial fibrillation (on chronic coumadin). He presented to the emergency room on 3/2 with shortness of breath which had gradually progressed over the past 2-3 weeks. He endorsed:  orthopnea, paroxysmal nocturnal dyspnea, dyspnea on exertion. He also reports having a cough for the past few weeks which is productive of grayish sputum, but denied any fevers. Did have ft-sided chest pain which appears to get worse when he walks or has a long coughing spell.No lower extremity edema, but felt that his abdomen may be somewhat swollen. He's not had any wheezing. He's not had any sick contacts or travel history. He's feeling increasingly weak. He was evaluated in the emergency room and was noted to have a left sided pleural effusion on chest x-ray. It was felt that he was in volume overload and the hospitalists were requested for admission. Treatment has included: oxygen, empiric antibiotics and diuresis.  PCCM was asked to see on 3/3 re: pleural effusion and possible need for thoracentesis.  PAST MEDICAL HISTORY :  Past Medical History  Diagnosis Date  . Allergy   . CAD (coronary artery  disease)   . Hyperlipidemia   . Kidney stones   . BPH (benign prostatic hypertrophy)   . Osteoarthritis   . COPD (chronic obstructive pulmonary disease)   . Hypothyroidism   . Atrial fibrillation   . Impaired fasting glucose    Past Surgical History  Procedure Laterality Date  . Inguinal hernia repair  09/2004    left  . Back surgery  1990's    x3  . Transurethral resection of prostate  2002  . Pleural scarification  1980    right  . Esophagogastroduodenoscopy  04/2002    negative  . Bladder stone removal  06/2006   Prior to Admission medications   Medication Sig Start Date End Date Taking? Authorizing Provider  albuterol (PROVENTIL HFA) 108 (90 BASE) MCG/ACT inhaler Inhale 2 puffs into the lungs every 4 (four) hours as needed for wheezing. 01/27/13  Yes Spencer Copland, MD  amitriptyline (ELAVIL) 25 MG tablet Take 25 mg by mouth at bedtime.   Yes Historical Provider, MD  aspirin 81 MG tablet Take 81 mg by mouth every morning.    Yes Historical Provider, MD  diltiazem (CARDIZEM CD) 180 MG 24 hr capsule Take 180 mg by mouth every morning.  02/01/11  Yes Historical Provider, MD  HYDROcodone-acetaminophen (NORCO/VICODIN) 5-325 MG per tablet Take 1 tablet by mouth 3 (three) times daily as needed. For pain 01/25/13  Yes Karie Schwalbe, MD  levothyroxine (SYNTHROID, LEVOTHROID) 25 MCG tablet Take 25 mcg by mouth every morning.   Yes Historical Provider, MD  nitroGLYCERIN (  NITROSTAT) 0.4 MG SL tablet Place 0.4 mg under the tongue every 5 (five) minutes as needed for chest pain. x3 doses for for chest pain 10/19/12  Yes Karie Schwalbe, MD  pravastatin (PRAVACHOL) 20 MG tablet Take 20 mg by mouth every morning.  07/06/12  Yes Historical Provider, MD  ramipril (ALTACE) 5 MG capsule Take 5 mg by mouth every morning.  01/11/11  Yes Historical Provider, MD  RAPAFLO 8 MG CAPS Take 8 mg by mouth every morning.  01/11/11  Yes Historical Provider, MD  tiotropium (SPIRIVA) 18 MCG inhalation capsule  Place 18 mcg into inhaler and inhale daily.   Yes Historical Provider, MD  warfarin (COUMADIN) 2.5 MG tablet Take 2.5 mg by mouth 2 (two) times a week. Takes on Tues and Thurs in the morning 08/10/12  Yes Karie Schwalbe, MD  warfarin (COUMADIN) 5 MG tablet Take 5 mg by mouth See admin instructions. Take on Mon, Wed, Fri, Sat & Sun. Takes in the morning.   Yes Historical Provider, MD   Allergies  Allergen Reactions  . Atorvastatin Other (See Comments)    REACTION: severe muscle aches  . Ezetimibe-Simvastatin Other (See Comments)    REACTION: severe muscle aches  . Fenofibrate     REACTION: "ran me up a wall"  . Penicillins Other (See Comments)    blisters    FAMILY HISTORY:  Family History  Problem Relation Age of Onset  . Heart disease Mother   . Heart disease Brother   . Stroke Brother   . Cancer Neg Hx   . Diabetes Neg Hx   . Hypertension Neg Hx   . Heart disease Brother   . Stroke Brother   . Heart disease Brother   . Stroke Brother    SOCIAL HISTORY:  reports that he quit smoking about 11 years ago. His smoking use included Cigarettes. He smoked 0.00 packs per day. He has never used smokeless tobacco. He reports that he does not drink alcohol or use illicit drugs.  REVIEW OF SYSTEMS:   12 point review of system negative other than DOE, orthopnea and PND.  SUBJECTIVE:   VITAL SIGNS: Temp:  [97 F (36.1 C)-98.8 F (37.1 C)] 97 F (36.1 C) (03/03 0519) Pulse Rate:  [43-117] 117 (03/03 0519) Resp:  [16-26] 18 (03/03 0519) BP: (104-143)/(51-86) 138/86 mmHg (03/03 0519) SpO2:  [98 %-100 %] 99 % (03/03 0819) Weight:  [57.335 kg (126 lb 6.4 oz)] 57.335 kg (126 lb 6.4 oz) (03/02 2232)  PHYSICAL EXAMINATION: General:  Well appearing male, resting comfortably in exam bed, NAD. Neuro:  Alert and oriented, moving all ext to command. HEENT:  [place]/AT, PERRL, EOM-I and MMM. Neck:  Supple, -LAN, -thyromegally and +JVD. Cardiovascular:  IRIR, Nl S1/S2, -M/R/G. Lungs:   Bibasilar rales, dullness to percussion on the left. Abdomen:  Soft, NT, ND and +BS. Musculoskeletal:  -edema and -tenderness. Skin:  Intact.   Recent Labs Lab 02/21/13 1813 02/22/13 0135  NA 136 136  K 4.9 4.2  CL 106 100  CO2  --  25  BUN 24* 25*  CREATININE 1.60* 1.59*  GLUCOSE 93 76    Recent Labs Lab 02/22/13 0042 02/22/13 0758  TROPONINI <0.30 <0.30   Recent Labs Lab 02/21/13 1813 02/22/13 0135  HGB 11.9* 12.9*  HCT 35.0* 38.7*  WBC  --  6.8  PLT  --  208   Lab Results  Component Value Date   INR 2.46* 02/22/2013   INR 2.51* 02/21/2013  INR 3.1 02/18/2013   Dg Chest 2 View  02/21/2013  *RADIOLOGY REPORT*  Clinical Data: Shortness of breath, chest pain  CHEST - 2 VIEW  Comparison: 01/27/2011  Findings: Chronic interstitial markings/emphysematous changes. Moderate left pleural effusion.  Associated left lower lobe opacity, atelectasis versus pneumonia.  The heart is normal in size.  Mild degenerative changes of the visualized thoracolumbar spine.  IMPRESSION: Moderate left pleural effusion.  Associated left lower lobe opacity, atelectasis versus pneumonia.   Original Report Authenticated By: Charline Bills, M.D.    ASSESSMENT / PLAN: Left pleural effusion. Unclear etiology but most likely cardiac related.  Not likely to be infectious in nature at all. PLAN: - Has CT chest ordered, this will help [place] to better eval pleural space-->if adequate size will sample as soon as INR is less than 2. - F/U echo. - Agree w/ empiric abx - Cont diuresis as tol  - Agree w/ holding coumadin for now.  - Address cardiac condition via rate control.  Alyson Reedy, M.D. Pulmonary and Critical Care Medicine [facility] Pager: [phone]  02/22/2013, 11:38 AM

## 2013-02-22 NOTE — Progress Notes (Signed)
Pt's HR keep goin up 140 reading as 150 and notified by Francena Hanly MT that  Is incorrect reading.  Dr. Lucienne Minks notified and instructed to start cardizem 5mg /hr and bolus of 10mg .  VS111/60, P 142.  Pt asymptomatic.  Also notified MD pt is requesting nasal spray .  Instructed will put in order for nasal spray.  Will continue to monitor.  Amanda Pea, Charity fundraiser.

## 2013-02-22 NOTE — Progress Notes (Signed)
  Echocardiogram 2D Echocardiogram has been performed.  Francys Bolin 02/22/2013, 11:36 AM

## 2013-02-23 ENCOUNTER — Inpatient Hospital Stay (HOSPITAL_COMMUNITY): Payer: Medicare Other

## 2013-02-23 DIAGNOSIS — Z7901 Long term (current) use of anticoagulants: Secondary | ICD-10-CM

## 2013-02-23 LAB — BODY FLUID CELL COUNT WITH DIFFERENTIAL
Eos, Fluid: 2 %
Monocyte-Macrophage-Serous Fluid: 14 % — ABNORMAL LOW (ref 50–90)

## 2013-02-23 LAB — PROTEIN, BODY FLUID: Total protein, fluid: 3.2 g/dL

## 2013-02-23 LAB — EXPECTORATED SPUTUM ASSESSMENT W GRAM STAIN, RFLX TO RESP C

## 2013-02-23 LAB — CBC
HCT: 38.8 % — ABNORMAL LOW (ref 39.0–52.0)
MCHC: 34.3 g/dL (ref 30.0–36.0)
Platelets: 226 10*3/uL (ref 150–400)
RDW: 14 % (ref 11.5–15.5)
WBC: 14.3 10*3/uL — ABNORMAL HIGH (ref 4.0–10.5)

## 2013-02-23 LAB — BASIC METABOLIC PANEL
Calcium: 10 mg/dL (ref 8.4–10.5)
GFR calc Af Amer: 43 mL/min — ABNORMAL LOW (ref >90)
GFR calc non Af Amer: 37 mL/min — ABNORMAL LOW (ref >90)
Glucose, Bld: 123 mg/dL — ABNORMAL HIGH (ref 70–99)
Potassium: 4 mEq/L (ref 3.5–5.1)
Sodium: 134 mEq/L — ABNORMAL LOW (ref 135–145)

## 2013-02-23 LAB — PROTIME-INR
INR: 1.78 — ABNORMAL HIGH (ref 0.00–1.49)
Prothrombin Time: 20.1 seconds — ABNORMAL HIGH (ref 11.6–15.2)

## 2013-02-23 LAB — STREP PNEUMONIAE URINARY ANTIGEN: Strep Pneumo Urinary Antigen: NEGATIVE

## 2013-02-23 MED ORDER — ENOXAPARIN SODIUM 30 MG/0.3ML ~~LOC~~ SOLN
30.0000 mg | Freq: Every day | SUBCUTANEOUS | Status: DC
  Administered 2013-02-23 – 2013-02-25 (×3): 30 mg via SUBCUTANEOUS
  Filled 2013-02-23 (×3): qty 0.3

## 2013-02-23 NOTE — Progress Notes (Signed)
[facility] PULMONARY  / CRITICAL CARE MEDICINE  Name: [name] MRN: [id number] DOB: 1928/01/31    ADMISSION DATE:  02/21/2013 CONSULTATION DATE: 3/3  REFERRING MD :  Izola Price   CHIEF COMPLAINT:  Pleural effusion   BRIEF PATIENT DESCRIPTION:  79 year old male w/ multiple co-morbids. Admitted on 3/2 w/ progressive dyspnea, orthopnea, pleuritic left sided chest pain and cough but no fever. Found to have left pleural effusion on admit. PCCM asked to eval re: effusion.    PMH  COPD, coronary artery disease with stent in past, atrial fibrillation (on chronic coumadin)  SIGNIFICANT EVENTS / STUDIES:  CT chest 3/3>>>left effusion  ECHO 3/3>>>EF 45 to 50% Lt thoracentesis 3/4>>>  LINES / TUBES:  CULTURES: Sputum 3/2>>> Lt pleural effusion 3/4>>>  ANTIBIOTICS: levaquin 3/2>>>  SUBJECTIVE:  No sig change.   VITAL SIGNS: Temp:  [97.2 F (36.2 C)-98.1 F (36.7 C)] 97.9 F (36.6 C) (03/04 0501) Pulse Rate:  [64-118] 94 (03/04 0501) Resp:  [18-20] 20 (03/04 0501) BP: (92-125)/(51-72) 123/59 mmHg (03/04 0501) SpO2:  [98 %-100 %] 98 % (03/04 0501) Weight:  [57.244 kg (126 lb 3.2 oz)] 57.244 kg (126 lb 3.2 oz) (03/04 0501)  PHYSICAL EXAMINATION: General:  Well appearing male, resting comfortably in exam bed, NAD.  Neuro:  Alert and oriented, moving all ext to command. HEENT:  [place]/AT, PERRL, EOM-I and MMM. Neck:  Supple, -LAN, -thyromegally and +JVD. Cardiovascular:  IRIR, Nl S1/S2, -M/R/G. Lungs:  Decreased on left  Abdomen:  Soft, NT, ND and +BS. Musculoskeletal:  -edema and -tenderness. Skin:  Intact.   Recent Labs Lab 02/21/13 1813 02/22/13 0135 02/23/13 0738  NA 136 136 134*  K 4.9 4.2 4.0  CL 106 100 97  CO2  --  25 24  BUN 24* 25* 36*  CREATININE 1.60* 1.59* 1.64*  GLUCOSE 93 76 123*    Recent Labs Lab 02/22/13 0042 02/22/13 0758  TROPONINI <0.30 <0.30    Recent Labs Lab 02/21/13 1813 02/22/13 0135 02/23/13 0738  HGB 11.9* 12.9* 13.3  HCT 35.0* 38.7*  38.8*  WBC  --  6.8 14.3*  PLT  --  208 226   Lab Results  Component Value Date   INR 1.78* 02/23/2013   INR 2.46* 02/22/2013   INR 2.51* 02/21/2013   Dg Chest 2 View  02/21/2013  *RADIOLOGY REPORT*  Clinical Data: Shortness of breath, chest pain  CHEST - 2 VIEW  Comparison: 01/27/2011  Findings: Chronic interstitial markings/emphysematous changes. Moderate left pleural effusion.  Associated left lower lobe opacity, atelectasis versus pneumonia.  The heart is normal in size.  Mild degenerative changes of the visualized thoracolumbar spine.  IMPRESSION: Moderate left pleural effusion.  Associated left lower lobe opacity, atelectasis versus pneumonia.   Original Report Authenticated By: Charline Bills, M.D.    Ct Chest Wo Contrast  02/23/2013  *RADIOLOGY REPORT*  Clinical Data: Cough, left-sided pneumonia versus atelectasis, left pleural effusion  CT CHEST WITHOUT CONTRAST  Technique:  Multidetector CT imaging of the chest was performed following the standard protocol without IV contrast.  Comparison: Chest radiographs dated 02/21/2013.  CT chest dated 11/20/2008.  Findings: Small to moderate left pleural effusion.  Associated compressive atelectasis in the left lower lobe.  Biapical pleural parenchymal scarring.  Extensive paraseptal and centrilobular emphysematous changes.  Mild scarring at the medial right lung base.  No suspicious pulmonary nodules.  No pneumothorax.  Visualized thyroid is unremarkable.  There is normal in size.  No pericardial effusion.  Coronary atherosclerosis.  Atherosclerotic calcifications of the aortic arch.  No suspicious mediastinal or axillary lymphadenopathy.  The visualized upper abdomen is grossly unremarkable.  Mild degenerative changes of the visualized thoracolumbar spine.  IMPRESSION: Small to moderate left pleural effusion.  Associated left lower lobe atelectasis.  Underlying extensive emphysematous changes with biapical pleural parenchymal scarring.   Original Report  Authenticated By: Charline Bills, M.D.    ASSESSMENT / PLAN: Left pleural effusion. Unclear etiology. Will go ahead w/ diagnostic thora on left to better identify etiology of pleural effusion  PLAN: - Agree w/ empiric abx - Continue diuresis as tol  - Agree w/ holding coumadin for now >> can resume after thoracentesis completed - Address cardiac condition via rate control.    02/23/2013, 2:04 PM  Reviewed above, examined pt, and agree with assessment/plan.    Bedside u/s shows moderate to large left pleural effusion.  Procedure explained to pt for thoracentesis.  Risks detailed as bleeding, infection, and pneumothorax.    Coralyn Helling, MD [facility] Pulmonary/Critical Care 02/23/2013, 4:20 PM Pager:  [phone] After 3pm call: [phone]

## 2013-02-23 NOTE — Progress Notes (Signed)
Patient ID: [name], male   DOB: 1928/03/04, 79 y.o.   MRN: [id number] [facility] PROGRESS NOTE  [name] JYN:[id number] DOB: 11/30/1928 DOA: 02/21/2013 PCP: Tillman Abide, MD  Brief narrative:  Pt is 79 y.o. male with a history of COPD, coronary artery disease with stent in past, atrial fibrillation, presented to [facility] ED with progressively worsening shortness of breath that initially started 2-3 weeks prior to this admission, associated with productive cough of clear - gray sputum, left sided chest pain, 3 pillow orthopnea, chills, generalized weakness. Pt denies similar events in the past. In ED, CXR findings indicating left sided pleural effusion, ? PNA. [facility] asked to admit to telemetry bed.   Principal Problem:  Acute respiratory failure  - secondary to left side pleural effusion and ? PNA - continue empiric ABX, sputum analysis pending  - appreciate PCCM input - CT chest with extensive emphysematous changes, continue nebulizers  Active Problems:  Atrial fibrillation with RVR  - continue low dose coreg as per home medication regimen  - continue Cardizem drip   - appreciate cardiology input COPD  - nebulizers as needed  Pleural effusion, left  - small to moderate size on CT scan, perhaps Lasix helped  - will follow up on PCCM rec's Acute CHF, systolic with EF 86% - daily weights, I's and O's, continue Lasix  Acute on chronic renal failure  - based on GFR, pt with chronic kidney disease stage II - III  - creatinine is up from yesterday  - will monitor renal function closely as pt on Lasix IV  - strict I's and O's  - hold ACEI, Ramipril  HYPOTHYROIDISM  - TSH elevated  - increased the dose of synthroid to 50 mcg  CORONARY ARTERY DISEASE  - contiue Aspirin   Consultants:  Cardiology  PCCM Procedures/Studies:  Dg Chest 2 View 02/21/2013 --> Moderate left pleural effusion. Associated left lower lobe opacity, atelectasis versus pneumonia.  Ct Chest Wo Contrast  02/23/2013  --> Small - moderate left pleural effusion, associated left lower lobe atelectasis, extensive emphysematous changes.  2 D ECHO 02/22/2013 00 --> EF 45% to 50%. Antibiotics:  Levaquin 03/02 -->  Code Status: Partial code, no intubation  Family Communication: Pt at bedside, no other family member at the bedside  Disposition Plan: PT evaluation when patient able to participate   HPI/Subjective: No events overnight.   Objective: Filed Vitals:   02/22/13 1507 02/22/13 1844 02/22/13 2152 02/23/13 0501  BP: 125/72 112/64 92/51 123/59  Pulse: 103 84 64 94  Temp: 98.1 F (36.7 C) 97.2 F (36.2 C) 97.7 F (36.5 C) 97.9 F (36.6 C)  TempSrc: Oral Oral Oral Oral  Resp: 18 18 19 20   Height:      Weight:    126 lb 3.2 oz (57.244 kg)  SpO2: 99% 100% 99% 98%    Intake/Output Summary (Last 24 hours) at 02/23/13 5784 Last data filed at 02/23/13 0800  Gross per 24 hour  Intake  987.1 ml  Output   1475 ml  Net -487.9 ml    Exam:   General:  Pt is alert, follows commands appropriately, not in acute distress  Cardiovascular: Irregular rate and rhythm, no murmurs, no rubs, no gallops  Respiratory: Decreased air movement on the left side  Abdomen: Soft, non tender, non distended, bowel sounds present, no guarding  Extremities: No edema, pulses DP and PT palpable bilaterally  Neuro: Grossly nonfocal  Data Reviewed: Basic Metabolic Panel:  Recent Labs Lab 02/21/13  1813 02/22/13 0135 02/23/13 0738  NA 136 136 134*  K 4.9 4.2 4.0  CL 106 100 97  CO2  --  25 24  GLUCOSE 93 76 123*  BUN 24* 25* 36*  CREATININE 1.60* 1.59* 1.64*  CALCIUM  --  9.7 10.0   Liver Function Tests:  Recent Labs Lab 02/22/13 0135  AST 18  ALT 15  ALKPHOS 93  BILITOT 0.4  PROT 7.3  ALBUMIN 3.5   CBC:  Recent Labs Lab 02/21/13 1813 02/22/13 0135 02/23/13 0738  WBC  --  6.8 14.3*  HGB 11.9* 12.9* 13.3  HCT 35.0* 38.7* 38.8*  MCV  --  89.6 87.0  PLT  --  208 226   Cardiac  Enzymes:  Recent Labs Lab 02/22/13 0042 02/22/13 0758  TROPONINI <0.30 <0.30    Recent Results (from the past 240 hour(s))  CULTURE, BLOOD (ROUTINE X 2)     Status: None   Collection Time    02/22/13  1:45 PM      Result Value Range Status   Specimen Description BLOOD ARM LEFT   Final   Special Requests BOTTLES DRAWN AEROBIC AND ANAEROBIC 10CC   Final   Culture  Setup Time 02/22/2013 19:06   Final   Culture     Final   Value:        BLOOD CULTURE RECEIVED NO GROWTH TO DATE CULTURE WILL BE HELD FOR 5 DAYS BEFORE ISSUING A FINAL NEGATIVE REPORT   Report Status PENDING   Incomplete  CULTURE, BLOOD (ROUTINE X 2)     Status: None   Collection Time    02/22/13  2:00 PM      Result Value Range Status   Specimen Description BLOOD ARM RIGHT   Final   Special Requests BOTTLES DRAWN AEROBIC AND ANAEROBIC 10CC   Final   Culture  Setup Time 02/22/2013 19:08   Final   Culture     Final   Value:        BLOOD CULTURE RECEIVED NO GROWTH TO DATE CULTURE WILL BE HELD FOR 5 DAYS BEFORE ISSUING A FINAL NEGATIVE REPORT   Report Status PENDING   Incomplete  CULTURE, EXPECTORATED SPUTUM-ASSESSMENT     Status: None   Collection Time    02/23/13  7:01 AM      Result Value Range Status   Specimen Description SPUTUM   Final   Special Requests NONE   Final   Sputum evaluation     Final   Value: MICROSCOPIC FINDINGS SUGGEST THAT THIS SPECIMEN IS NOT REPRESENTATIVE OF LOWER RESPIRATORY SECRETIONS. PLEASE RECOLLECT.     CALLED TO N BUCK,RN 02/23/13 0836 BY K SCHULTZ   Report Status 02/23/2013 FINAL   Final     Scheduled Meds: . amitriptyline  25 mg Oral QHS  . aspirin  81 mg Oral q morning - 10a  . carvedilol  3.125 mg Oral BID WC  . furosemide  40 mg Intravenous BID  . levofloxacin (LEVAQUIN) IV  750 mg Intravenous Q48H  . levothyroxine  50 mcg Oral QAC breakfast  . sodium chloride  3 mL Intravenous Q12H  . tamsulosin  0.4 mg Oral QPC supper  . tiotropium  18 mcg Inhalation Daily   Continuous  Infusions: . diltiazem (CARDIZEM) infusion 8 mg/hr (02/23/13 0925)     Debbora Presto, MD  [facility] Pager [phone]  If 7PM-7AM, please contact night-coverage [email or web address] Password [email or web address] 02/23/2013, 9:53 AM   LOS: 2 days

## 2013-02-23 NOTE — Progress Notes (Signed)
Subjective:  Does not feel well. C/o pain in abdomen and alsoo n the left side.  Objective:  Vital Signs in the last 24 hours: BP 123/59  Pulse 94  Temp(Src) 97.9 F (36.6 C) (Oral)  Resp 20  Ht 5\' 7"  (1.702 m)  Wt 57.244 kg (126 lb 3.2 oz)  BMI 19.76 kg/m2  SpO2 98%  Physical Exam:  Lungs:  Reduced breath sounds on the left Cardiac:  iregular rhythm, normal S1 and S2, no S3 1-2 6 systolic murmur Abdomen:  Soft, nontender, no masses Extremities:  No edema present  Intake/Output from previous day: 03/03 0701 - 03/04 0700 In: 1227.1 [P.O.:920; I.V.:307.1] Out: 1575 [Urine:1575] Weight Filed Weights   02/21/13 2232 02/23/13 0501  Weight: 57.335 kg (126 lb 6.4 oz) 57.244 kg (126 lb 3.2 oz)    Lab Results: Basic Metabolic Panel:  Recent Labs  16/10/96 0135 02/23/13 0738  NA 136 134*  K 4.2 4.0  CL 100 97  CO2 25 24  GLUCOSE 76 123*  BUN 25* 36*  CREATININE 1.59* 1.64*    CBC:  Recent Labs  02/22/13 0135 02/23/13 0738  WBC 6.8 14.3*  HGB 12.9* 13.3  HCT 38.7* 38.8*  MCV 89.6 87.0  PLT 208 226    BNP    Component Value Date/Time   PROBNP 1108.0* 02/22/2013 1610    PROTIME: Lab Results  Component Value Date   INR 1.78* 02/23/2013   INR 2.46* 02/22/2013   INR 2.51* 02/21/2013    Telemetry: Atrial fibrillation with controlled to mildly rapid response  Assessment/Plan:  1. Chronic atrial fibrillation 2. CAD with prior stent to the circ 3. Hyperlipidemia with statin intolerance 4. Severe COPD 5. Left pleural effusion  Rec:  INR is less than 2 so would go ahead and tap effusion for diagnosis.  A fib is chronic. Resume anticoagulants when effusion tapped. Control rate.     Darden Palmer  MD [facility] Cardiology  02/23/2013, 1:08 PM

## 2013-02-23 NOTE — Procedures (Signed)
Thoracentesis Procedure Note  Pre-operative Diagnosis: Left pleural effusion  Post-operative Diagnosis: same  Indications: 79 yo with dyspnea and new Left pleural effusion.  Procedure Details  Consent: Informed consent was obtained. Risks of the procedure were discussed including: infection, bleeding, pain, pneumothorax.  Under sterile conditions the patient was positioned. Betadine solution and sterile drapes were utilized.  1% plain lidocaine was used to anesthetize the 7 rib space. Fluid was obtained without any difficulties and minimal blood loss.  A dressing was applied to the wound and wound care instructions were provided.   Findings 850 ml of clear, yellow pleural fluid was obtained.  Complications:  None; patient tolerated the procedure well.          Condition: stable  Plan -post procedure chest xray ordered -Fluid sent for glucose, protein, LDH, cell count with differential, gram stain and culture, and cytology.  Performed by Anders Simmonds, ACNP with ultrasound guidance.  Attending Attestation: I was present for the entire procedure.  Coralyn Helling, MD [facility] Pulmonary/Critical Care 02/23/2013, 2:33 PM Pager:  [phone] After 3pm call: [phone]

## 2013-02-23 NOTE — Care Management Note (Signed)
    Page 1 of 1   02/23/2013     4:52:42 PM   CARE MANAGEMENT NOTE 02/23/2013  Patient:  [facility]   Account Number:  [id number]  Date Initiated:  02/23/2013  Documentation initiated by:  Tera Mater  Subjective/Objective Assessment:   79yo male admitted with Pleural Effusions.  Pt. lives at home with spouse in [place].     Action/Plan:   Discharge planning   Anticipated DC Date:  02/24/2013   Anticipated DC Plan:  HOME W HOME HEALTH SERVICES      DC Planning Services  CM consult      [facility] Choice  HOME HEALTH   Choice offered to / List presented to:  C-1 Patient        HH arranged  HH-1 RN  HH-10 DISEASE MANAGEMENT      HH agency  [facility]   Status of service:  In process, will continue to follow Medicare Important Message given?   (If response is "NO", the following Medicare IM given date fields will be blank) Date Medicare IM given:   Date Additional Medicare IM given:    Discharge Disposition:    Per UR Regulation:  Reviewed for med. necessity/level of care/duration of stay  If discussed at Long Length of Stay Meetings, dates discussed:    Comments:  02/23/13 1645 In to complete Heart Failure Home Health Screen.  Pt. is interested in having home health services, and chose [facility].  TC to Lupita Leash, with [facility], to give referral for [facility] RN for Heart Failure management.  Pt. has a walker, and cane at home.  NCM will follow. Tera Mater, RN, BSN NCM [phone]

## 2013-02-24 LAB — CBC
MCH: 29 pg (ref 26.0–34.0)
Platelets: 204 10*3/uL (ref 150–400)
RBC: 4.24 MIL/uL (ref 4.22–5.81)
RDW: 13.9 % (ref 11.5–15.5)
WBC: 7.8 10*3/uL (ref 4.0–10.5)

## 2013-02-24 LAB — BASIC METABOLIC PANEL
Chloride: 95 mEq/L — ABNORMAL LOW (ref 96–112)
Creatinine, Ser: 1.66 mg/dL — ABNORMAL HIGH (ref 0.50–1.35)
GFR calc Af Amer: 43 mL/min — ABNORMAL LOW (ref >90)
Potassium: 3.7 mEq/L (ref 3.5–5.1)

## 2013-02-24 LAB — PATHOLOGIST SMEAR REVIEW: Path Review: REACTIVE

## 2013-02-24 LAB — LEGIONELLA ANTIGEN, URINE: Legionella Antigen, Urine: NEGATIVE

## 2013-02-24 LAB — PROTIME-INR: Prothrombin Time: 18.8 seconds — ABNORMAL HIGH (ref 11.6–15.2)

## 2013-02-24 MED ORDER — MAGNESIUM HYDROXIDE 400 MG/5ML PO SUSP
30.0000 mL | Freq: Every evening | ORAL | Status: DC | PRN
  Administered 2013-02-24: 30 mL via ORAL
  Filled 2013-02-24: qty 30

## 2013-02-24 MED ORDER — DILTIAZEM HCL ER COATED BEADS 180 MG PO CP24
180.0000 mg | ORAL_CAPSULE | Freq: Every day | ORAL | Status: DC
  Administered 2013-02-24 – 2013-02-25 (×2): 180 mg via ORAL
  Filled 2013-02-24 (×2): qty 1

## 2013-02-24 MED ORDER — SALINE SPRAY 0.65 % NA SOLN
1.0000 | Freq: Two times a day (BID) | NASAL | Status: DC
  Administered 2013-02-24 – 2013-02-25 (×3): 1 via NASAL
  Filled 2013-02-24: qty 44

## 2013-02-24 MED ORDER — WARFARIN - PHARMACIST DOSING INPATIENT: Freq: Every day | Status: DC

## 2013-02-24 MED ORDER — WARFARIN SODIUM 7.5 MG PO TABS
7.5000 mg | ORAL_TABLET | Freq: Once | ORAL | Status: AC
  Administered 2013-02-24: 7.5 mg via ORAL
  Filled 2013-02-24: qty 1

## 2013-02-24 MED ORDER — FLUTICASONE PROPIONATE 50 MCG/ACT NA SUSP
2.0000 | Freq: Every day | NASAL | Status: DC
  Administered 2013-02-24 – 2013-02-25 (×2): 2 via NASAL
  Filled 2013-02-24: qty 16

## 2013-02-24 NOTE — Progress Notes (Signed)
ANTICOAGULATION CONSULT NOTE - Initial Consult  Pharmacy Consult:  Coumadin Indication:  Atrial fibrillation  Allergies  Allergen Reactions  . Atorvastatin Other (See Comments)    REACTION: severe muscle aches  . Ezetimibe-Simvastatin Other (See Comments)    REACTION: severe muscle aches  . Fenofibrate     REACTION: "ran me up a wall"  . Penicillins Other (See Comments)    blisters    Patient Measurements: Height: 5\' 7"  (170.2 cm) Weight: 125 lb 14.1 oz (57.1 kg) (scale b) IBW/kg (Calculated) : 66.1  Vital Signs: Temp: 97 F (36.1 C) (03/05 0438) Temp src: Oral (03/05 0438) BP: 115/62 mmHg (03/05 0649) Pulse Rate: 78 (03/05 0649)  Labs:  Recent Labs  02/22/13 0042 02/22/13 0135 02/22/13 0758 02/23/13 0738 02/24/13 0638  HGB  --  12.9*  --  13.3 12.3*  HCT  --  38.7*  --  38.8* 36.1*  PLT  --  208  --  226 204  LABPROT  --  25.5*  --  20.1* 18.8*  INR  --  2.46*  --  1.78* 1.63*  CREATININE  --  1.59*  --  1.64* 1.66*  TROPONINI <0.30  --  <0.30  --   --     Estimated Creatinine Clearance: 27.7 ml/min (by C-G formula based on Cr of 1.66).   Medical History: Past Medical History  Diagnosis Date  . Allergy   . CAD (coronary artery disease)   . Hyperlipidemia   . Kidney stones   . BPH (benign prostatic hypertrophy)   . Osteoarthritis   . COPD (chronic obstructive pulmonary disease)   . Hypothyroidism   . Atrial fibrillation   . Impaired fasting glucose        Assessment: 84 YOM with atrial fibrillation to resume Coumadin s/p thoracentesis.  Patient continues on Lovenox therapy.  INR has decreased to 1.63 and expect it to decrease further.  Noted he is on Levaquin, which could increase the effect of Coumadin.  No bleeding reported.  His INR was therapeutic upon admission on his home dose (Coumadin 5mg  daily except 2.5mg  Tues/Thurs).   Goal of Therapy:  INR 2-3 Monitor platelets by anticoagulation protocol: Yes    Plan:  - Coumadin 7.5mg  PO  today - Continue Lovenox until INR is therapeutic - Continue LVQ 750mg  IV Q48H as ordered - Daily PT / INR     Winry Egnew D. Laney Potash, PharmD, BCPS Pager:  [phone] 02/24/2013, 10:02 AM

## 2013-02-24 NOTE — Progress Notes (Signed)
Subjective:  Feels much better today after thoracentesis of 850 cc. No t SOB.  Mild soreness.  Objective:  Vital Signs in the last 24 hours: BP 115/62  Pulse 78  Temp(Src) 97 F (36.1 C) (Oral)  Resp 18  Ht 5\' 7"  (1.702 m)  Wt 57.1 kg (125 lb 14.1 oz)  BMI 19.71 kg/m2  SpO2 97%  Physical Exam: Pleasant WM in NAD Lungs:  Reduced breath sounds on the left Cardiac:  iregular rhythm, normal S1 and S2, no S3 1-2 6 systolic murmur Abdomen:  Soft, nontender, no masses Extremities:  No edema present  Intake/Output from previous day: 03/04 0701 - 03/05 0700 In: 1370 [P.O.:1220; IV Piggyback:150] Out: 1175 [Urine:1175] Weight Filed Weights   02/21/13 2232 02/23/13 0501 02/24/13 0438  Weight: 57.335 kg (126 lb 6.4 oz) 57.244 kg (126 lb 3.2 oz) 57.1 kg (125 lb 14.1 oz)    Lab Results: Basic Metabolic Panel:  Recent Labs  81/19/14 0738 02/24/13 0638  NA 134* 133*  K 4.0 3.7  CL 97 95*  CO2 24 29  GLUCOSE 123* 115*  BUN 36* 39*  CREATININE 1.64* 1.66*    CBC:  Recent Labs  02/23/13 0738 02/24/13 0638  WBC 14.3* 7.8  HGB 13.3 12.3*  HCT 38.8* 36.1*  MCV 87.0 85.1  PLT 226 204    BNP    Component Value Date/Time   PROBNP 1108.0* 02/22/2013 1610    PROTIME: Lab Results  Component Value Date   INR 1.63* 02/24/2013   INR 1.78* 02/23/2013   INR 2.46* 02/22/2013    Telemetry: Atrial fibrillation with controlled response  Assessment/Plan:  1. Chronic atrial fibrillation 2. CAD with prior stent to the circ 3. Hyperlipidemia with statin intolerance 4. Severe COPD 5. Left pleural effusion awiting cytology  Rec:  Restart warfarin to get INR up again.   Darden Palmer  MD [facility] Cardiology  02/24/2013, 9:26 AM

## 2013-02-24 NOTE — Progress Notes (Signed)
[facility] PULMONARY  / CRITICAL CARE MEDICINE  Name: [name] MRN: [id number] DOB: [birth year]    ADMISSION DATE:  02/21/2013 CONSULTATION DATE: 3/3  REFERRING MD :  Izola Price   CHIEF COMPLAINT:  Pleural effusion   BRIEF PATIENT DESCRIPTION:  80 year old male w/ multiple co-morbids. Admitted on 3/2 w/ progressive dyspnea, orthopnea, pleuritic left sided chest pain and cough but no fever. Found to have left pleural effusion on admit. PCCM asked to eval re: effusion.    PMH  COPD, coronary artery disease with stent in past, atrial fibrillation (on chronic coumadin)  SIGNIFICANT EVENTS / STUDIES:  CT chest 3/3>>>left effusion  ECHO 3/3>>>EF 45 to 50% Lt thoracentesis 3/4>>>glucose 135, protein 3.2, LDH 99, WBC 2254 (13N, 71L, 55M, 2E)  CULTURES: Sputum 3/2>>> Lt pleural effusion 3/4>>>  ANTIBIOTICS: levaquin 3/2>>>  SUBJECTIVE:  Breathing better.  Has mild abdominal soreness.  No cough, wheeze, or chest congestion.  C/o sinus congestion.  VITAL SIGNS: Temp:  [97 F (36.1 C)-98.3 F (36.8 C)] 97.8 F (36.6 C) (03/05 1115) Pulse Rate:  [77-93] 83 (03/05 1115) Resp:  [18-20] 20 (03/05 1115) BP: (101-115)/(55-62) 110/58 mmHg (03/05 1115) SpO2:  [95 %-98 %] 98 % (03/05 1115) Weight:  [125 lb 14.1 oz (57.1 kg)] 125 lb 14.1 oz (57.1 kg) (03/05 0438)  PHYSICAL EXAMINATION: General:  No distress Neuro:  Normal strength HEENT:  Clear nasal discharge Cardiovascular:  s1s2  Lungs: no wheeze Abdomen:  Soft, non tender Musculoskeletal: no edema Skin: no rashes   Recent Labs Lab 02/22/13 0135 02/23/13 0738 02/24/13 0638  NA 136 134* 133*  K 4.2 4.0 3.7  CL 100 97 95*  CO2 25 24 29   BUN 25* 36* 39*  CREATININE 1.59* 1.64* 1.66*  GLUCOSE 76 123* 115*    Recent Labs Lab 02/22/13 0042 02/22/13 0758  TROPONINI <0.30 <0.30    Recent Labs Lab 02/22/13 0135 02/23/13 0738 02/24/13 0638  HGB 12.9* 13.3 12.3*  HCT 38.7* 38.8* 36.1*  WBC 6.8 14.3* 7.8  PLT 208 226 204    Lab Results  Component Value Date   INR 1.63* 02/24/2013   INR 1.78* 02/23/2013   INR 2.46* 02/22/2013   Ct Chest Wo Contrast  02/23/2013  *RADIOLOGY REPORT*  Clinical Data: Cough, left-sided pneumonia versus atelectasis, left pleural effusion  CT CHEST WITHOUT CONTRAST  Technique:  Multidetector CT imaging of the chest was performed following the standard protocol without IV contrast.  Comparison: Chest radiographs dated 02/21/2013.  CT chest dated 11/20/2008.  Findings: Small to moderate left pleural effusion.  Associated compressive atelectasis in the left lower lobe.  Biapical pleural parenchymal scarring.  Extensive paraseptal and centrilobular emphysematous changes.  Mild scarring at the medial right lung base.  No suspicious pulmonary nodules.  No pneumothorax.  Visualized thyroid is unremarkable.  There is normal in size.  No pericardial effusion.  Coronary atherosclerosis.  Atherosclerotic calcifications of the aortic arch.  No suspicious mediastinal or axillary lymphadenopathy.  The visualized upper abdomen is grossly unremarkable.  Mild degenerative changes of the visualized thoracolumbar spine.  IMPRESSION: Small to moderate left pleural effusion.  Associated left lower lobe atelectasis.  Underlying extensive emphysematous changes with biapical pleural parenchymal scarring.   Original Report Authenticated By: Charline Bills, M.D.    Dg Chest Port 1 View  02/23/2013  *RADIOLOGY REPORT*  Clinical Data: Left thoracentesis  PORTABLE CHEST - 1 VIEW  Comparison: Earlier today  Findings: No convincing pneumothorax post thoracentesis. Interstitial changes and hyperaeration persist.  Volume loss at the left base has improved.  Normal heart size.  IMPRESSION: No pneumothorax post thoracentesis.   Original Report Authenticated By: Jolaine Click, M.D.    ASSESSMENT / PLAN:  Transudate left pleural effusion >> likely related to cardiac disease.  Does have predominance of lymphocytes in effusion  ?significance. Plan: Can likely d/c Abx soon >> PNA unlikely; defer to primary team F/u pleural fluid cytology Diuresis per primary team and cardiology F/u CXR as needed  Hx of COPD. Plan: Continue spiriva and prn albuterol  Nasal congestion. Plan: Add nasal irrigation and flonase 3/05  PCCM will sign off. Please call if additional help needed.  Coralyn Helling, MD [facility] Pulmonary/Critical Care 02/24/2013, 1:05 PM Pager:  [phone] After 3pm call: [phone]

## 2013-02-24 NOTE — Progress Notes (Signed)
Patient ID: [name], male   DOB: 1928-02-15, 79 y.o.   MRN: [id number] [facility] PROGRESS NOTE  Dakin Madani WUJ:[id number] DOB: Oct 05, 1928 DOA: 02/21/2013 PCP: Tillman Abide, MD  Brief narrative:  Pt is 79 y.o. male with a history of COPD, coronary artery disease with stent in past, atrial fibrillation, presented to [facility] ED with progressively worsening shortness of breath that initially started 2-3 weeks prior to this admission, associated with productive cough of clear - gray sputum, left sided chest pain, 3 pillow orthopnea, chills, generalized weakness. Pt denies similar events in the past. In ED, CXR findings indicating left sided pleural effusion, ? PNA. [facility] asked to admit to telemetry bed.   Assessment and plan 1. Acute respiratory failure: secondary to left side pleural effusion and ? PNA. Not convincing for pneumonia-consider DC in antibiotics. CT chest with extensive emphysematous changes, continue nebulizers. CCM signed off 3/5.  2. Chronic atrial fibrillation: RVR resolved. Continue carvedilol and resuming Coumadin. On Cardizem infusion-can possibly be discontinued-defer to cardiology. Cardiology following. 3. COPD: Stable.Nebulizers as needed  4. Transurethral Pleural effusion, left: Status post thoracentesis on 3/4. Likely related to cardiac disease. Improved. Follow pleural fluid cytology. Possible DC antibiotics in the a.m.  5. Acute CHF, systolic with EF 95%: Improving. Continue IV Lasix. Cardiology following. 6. Acute on chronic renal failure stage II-3: Creatinine has plateaued in the 1.6 range. Baseline creatinine probably 1.3. Trend daily BMP. 7. HYPOTHYROIDISM: TSH elevated. Increased the dose of synthroid to 50 mcg  8. CORONARY ARTERY DISEASE: Contiue Aspirin    Consultants:  Cardiology  PCCM  Procedures/Studies:  Dg Chest 2 View 02/21/2013 --> Moderate left pleural effusion. Associated left lower lobe opacity, atelectasis versus pneumonia.  Ct Chest Wo  Contrast 02/23/2013  --> Small - moderate left pleural effusion, associated left lower lobe atelectasis, extensive emphysematous changes.  2 D ECHO 02/22/2013 00 --> EF 45% to 50%.  Antibiotics:  Levaquin 03/02 -->  Code Status: Partial code, no intubation  Family Communication: Discussed with spouse at bedside. Disposition Plan: Home when medically stable.  HPI/Subjective: Dyspnea significantly improved after thoracentesis. Chest slightly sore, intermittently.  Objective: Filed Vitals:   02/24/13 0758 02/24/13 0802 02/24/13 1115 02/24/13 1400  BP:   110/58 108/65  Pulse:   83 88  Temp:   97.8 F (36.6 C) 97.4 F (36.3 C)  TempSrc:   Oral Oral  Resp:   20 20  Height:      Weight:      SpO2: 98% 97% 98% 98%    Intake/Output Summary (Last 24 hours) at 02/24/13 1710 Last data filed at 02/24/13 1300  Gross per 24 hour  Intake   1390 ml  Output   1600 ml  Net   -210 ml    Exam:   General:  Pt is alert, follows commands appropriately, not in acute distress  Cardiovascular: Irregular rate and rhythm, no murmurs, no rubs, no gallops  Respiratory: Reduced breath sounds in bases. Rest clear to auscultation. No increased work of breathing.  Abdomen: Soft, non tender, non distended, bowel sounds present, no guarding  Extremities: No edema, pulses DP and PT palpable bilaterally  Neuro: Grossly nonfocal. Alert and oriented  Data Reviewed: Basic Metabolic Panel:  Recent Labs Lab 02/21/13 1813 02/22/13 0135 02/23/13 0738 02/24/13 0638  NA 136 136 134* 133*  K 4.9 4.2 4.0 3.7  CL 106 100 97 95*  CO2  --  25 24 29   GLUCOSE 93 76 123* 115*  BUN 24*  25* 36* 39*  CREATININE 1.60* 1.59* 1.64* 1.66*  CALCIUM  --  9.7 10.0 9.5   Liver Function Tests:  Recent Labs Lab 02/22/13 0135  AST 18  ALT 15  ALKPHOS 93  BILITOT 0.4  PROT 7.3  ALBUMIN 3.5   CBC:  Recent Labs Lab 02/21/13 1813 02/22/13 0135 02/23/13 0738 02/24/13 0638  WBC  --  6.8 14.3* 7.8  HGB  11.9* 12.9* 13.3 12.3*  HCT 35.0* 38.7* 38.8* 36.1*  MCV  --  89.6 87.0 85.1  PLT  --  208 226 204   Cardiac Enzymes:  Recent Labs Lab 02/22/13 0042 02/22/13 0758  TROPONINI <0.30 <0.30    Recent Results (from the past 240 hour(s))  CULTURE, BLOOD (ROUTINE X 2)     Status: None   Collection Time    02/22/13  1:45 PM      Result Value Range Status   Specimen Description BLOOD ARM LEFT   Final   Special Requests BOTTLES DRAWN AEROBIC AND ANAEROBIC 10CC   Final   Culture  Setup Time 02/22/2013 19:06   Final   Culture     Final   Value:        BLOOD CULTURE RECEIVED NO GROWTH TO DATE CULTURE WILL BE HELD FOR 5 DAYS BEFORE ISSUING A FINAL NEGATIVE REPORT   Report Status PENDING   Incomplete  CULTURE, BLOOD (ROUTINE X 2)     Status: None   Collection Time    02/22/13  2:00 PM      Result Value Range Status   Specimen Description BLOOD ARM RIGHT   Final   Special Requests BOTTLES DRAWN AEROBIC AND ANAEROBIC 10CC   Final   Culture  Setup Time 02/22/2013 19:08   Final   Culture     Final   Value:        BLOOD CULTURE RECEIVED NO GROWTH TO DATE CULTURE WILL BE HELD FOR 5 DAYS BEFORE ISSUING A FINAL NEGATIVE REPORT   Report Status PENDING   Incomplete  CULTURE, EXPECTORATED SPUTUM-ASSESSMENT     Status: None   Collection Time    02/23/13  7:01 AM      Result Value Range Status   Specimen Description SPUTUM   Final   Special Requests NONE   Final   Sputum evaluation     Final   Value: MICROSCOPIC FINDINGS SUGGEST THAT THIS SPECIMEN IS NOT REPRESENTATIVE OF LOWER RESPIRATORY SECRETIONS. PLEASE RECOLLECT.     CALLED TO N BUCK,RN 02/23/13 0836 BY K SCHULTZ   Report Status 02/23/2013 FINAL   Final  BODY FLUID CULTURE     Status: None   Collection Time    02/23/13  3:24 PM      Result Value Range Status   Specimen Description PLEURAL FLUID LEFT   Final   Special Requests FLUID   Final   Gram Stain     Final   Value: MODERATE WBC PRESENT, PREDOMINANTLY PMN     NO ORGANISMS SEEN    Culture NO GROWTH 1 DAY   Final   Report Status PENDING   Incomplete     Scheduled Meds: . amitriptyline  25 mg Oral QHS  . aspirin  81 mg Oral q morning - 10a  . carvedilol  3.125 mg Oral BID WC  . enoxaparin (LOVENOX) injection  30 mg Subcutaneous Daily  . fluticasone  2 spray Each Nare Daily  . furosemide  40 mg Intravenous BID  . levofloxacin (LEVAQUIN) IV  750 mg Intravenous Q48H  . levothyroxine  50 mcg Oral QAC breakfast  . sodium chloride  1 spray Each Nare BID  . sodium chloride  3 mL Intravenous Q12H  . tamsulosin  0.4 mg Oral QPC supper  . tiotropium  18 mcg Inhalation Daily  . warfarin  7.5 mg Oral ONCE-1800  . Warfarin - Pharmacist Dosing Inpatient   Does not apply q1800   Continuous Infusions: . diltiazem (CARDIZEM) infusion 8 mg/hr (02/24/13 1322)     Tsering Leaman, MD  [facility] Pager [phone]  If 7PM-7AM, please contact night-coverage [email or web address] Password [email or web address] 02/24/2013, 5:10 PM   LOS: 3 days

## 2013-02-25 ENCOUNTER — Telehealth: Payer: Self-pay | Admitting: Internal Medicine

## 2013-02-25 LAB — BASIC METABOLIC PANEL
Calcium: 9.5 mg/dL (ref 8.4–10.5)
Creatinine, Ser: 1.55 mg/dL — ABNORMAL HIGH (ref 0.50–1.35)
GFR calc non Af Amer: 40 mL/min — ABNORMAL LOW (ref >90)
Glucose, Bld: 105 mg/dL — ABNORMAL HIGH (ref 70–99)
Sodium: 135 mEq/L (ref 135–145)

## 2013-02-25 MED ORDER — WARFARIN SODIUM 7.5 MG PO TABS
7.5000 mg | ORAL_TABLET | Freq: Once | ORAL | Status: AC
  Administered 2013-02-25: 7.5 mg via ORAL
  Filled 2013-02-25: qty 1

## 2013-02-25 MED ORDER — FUROSEMIDE 40 MG PO TABS
40.0000 mg | ORAL_TABLET | Freq: Two times a day (BID) | ORAL | Status: DC
  Administered 2013-02-25: 40 mg via ORAL
  Filled 2013-02-25 (×2): qty 1

## 2013-02-25 MED ORDER — FUROSEMIDE 40 MG PO TABS: 40.0000 mg | ORAL_TABLET | Freq: Two times a day (BID) | ORAL | Status: DC

## 2013-02-25 MED ORDER — FLUTICASONE PROPIONATE 50 MCG/ACT NA SUSP: 2.0000 | Freq: Every day | NASAL | Status: DC

## 2013-02-25 MED ORDER — POTASSIUM CHLORIDE CRYS ER 20 MEQ PO TBCR: 20.0000 meq | EXTENDED_RELEASE_TABLET | Freq: Every day | ORAL | Status: DC

## 2013-02-25 MED ORDER — POTASSIUM CHLORIDE CRYS ER 20 MEQ PO TBCR
20.0000 meq | EXTENDED_RELEASE_TABLET | Freq: Two times a day (BID) | ORAL | Status: DC
  Administered 2013-02-25: 20 meq via ORAL
  Filled 2013-02-25: qty 1

## 2013-02-25 MED ORDER — LEVOFLOXACIN 750 MG PO TABS: 750.0000 mg | ORAL_TABLET | ORAL | Status: DC

## 2013-02-25 MED ORDER — CARVEDILOL 3.125 MG PO TABS: 3.1250 mg | ORAL_TABLET | Freq: Two times a day (BID) | ORAL | Status: DC

## 2013-02-25 NOTE — Evaluation (Signed)
Physical Therapy Evaluation Patient Details Name: [name] MRN: [id number] DOB: 02-13-1928 Today's Date: 02/25/2013 Time: 1478-2956 PT Time Calculation (min): 18 min  PT Assessment / Plan / Recommendation Clinical Impression  Pt admitted with SOB with left pleural effusion s/p thoracentesis and overall is moving well. Pt endorses 2 falls in the last year and educated for fall risk and need to be seated for dressing and bathing as well as to use at least a cane in the short term but pt rather resistant to these suggestions with wife stating "he is stubborn" and "he won't eat healthy either" . Pt will benefit from acute therapy to maximize mobility, gait, transfers and balance prior to discharge home to increase safety awareness. Pt fatigued end of session but no dyspnea and unable to get pulse ox reading.     PT Assessment  Patient needs continued PT services    Follow Up Recommendations  Home health PT    Does the patient have the potential to tolerate intense rehabilitation      Barriers to Discharge None      Equipment Recommendations  Other (comment) (shower chair)    Recommendations for Other Services     Frequency Min 3X/week    Precautions / Restrictions Precautions Precautions: Fall   Pertinent Vitals/Pain No pain      Mobility  Bed Mobility Bed Mobility: Supine to Sit Supine to Sit: HOB flat;6: Modified independent (Device/Increase time) Transfers Transfers: Sit to Stand;Stand to Sit Sit to Stand: 6: Modified independent (Device/Increase time);From bed Stand to Sit: 6: Modified independent (Device/Increase time);To chair/3-in-1 Ambulation/Gait Ambulation/Gait Assistance: 4: Min guard Ambulation Distance (Feet): 250 Feet Assistive device: None Ambulation/Gait Assistance Details: guarding due to pt scissoring multiple times throughout gait with ability to catch self with hall rail prior to LOB and did not require physical assist to correct but in open area  does require assist to correct balance Gait Pattern: Step-through pattern Gait velocity: WFL Stairs: Yes Stairs Assistance: 6: Modified independent (Device/Increase time) Stair Management Technique: One rail Right Number of Stairs: 8    Exercises     PT Diagnosis: Difficulty walking  PT Problem List: Decreased activity tolerance;Decreased balance;Decreased safety awareness PT Treatment Interventions: Gait training;DME instruction;Functional mobility training;Therapeutic activities;Balance training;Patient/family education   PT Goals Acute Rehab PT Goals PT Goal Formulation: With patient/family Time For Goal Achievement: 03/04/13 Potential to Achieve Goals: Good Pt will Ambulate: >150 feet;with modified independence;with least restrictive assistive device PT Goal: Ambulate - Progress: Goal set today Additional Goals Additional Goal #1: Pt will score >19 on DGI PT Goal: Additional Goal #1 - Progress: Goal set today  Visit Information  Last PT Received On: 02/25/13 Assistance Needed: +1    Subjective Data  Subjective: I don't want to use a shower seat Patient Stated Goal: go home today   Prior Functioning  Home Living Lives With: Spouse Available Help at Discharge: Family Type of Home: House Home Access: Stairs to enter Secretary/administrator of Steps: 7 Entrance Stairs-Rails: Right Home Layout: One level Bathroom Shower/Tub: Walk-in shower;Door [employer] Toilet: Standard Home Adaptive Equipment: Environmental consultant - rolling;Straight cane Prior Function Level of Independence: Independent Able to Take Stairs?: Yes Driving: Yes Vocation: Retired Musician: No difficulties    Copywriter, advertising Overall Cognitive Status: Impaired Area of Impairment: Safety/judgement Arousal/Alertness: Awake/alert Orientation Level: Appears intact for tasks assessed Behavior During Session: [facility] for tasks performed Safety/Judgement: Decreased safety judgement for tasks  assessed Safety/Judgement - Other Comments: pt aware of unsteady gait, LOB  and need to hold onto items with challenges but resistant to education for fall risk    Extremity/Trunk Assessment Right Upper Extremity Assessment RUE ROM/Strength/Tone: [facility] for tasks assessed Left Upper Extremity Assessment LUE ROM/Strength/Tone: [facility] for tasks assessed Right Lower Extremity Assessment RLE ROM/Strength/Tone: [facility] for tasks assessed Left Lower Extremity Assessment LLE ROM/Strength/Tone: [facility] for tasks assessed Trunk Assessment Trunk Assessment: Normal   Balance High Level Balance High Level Balance Comments: Pt required propping on counter to be able to pick up foot and partial LOB with head turns, denied further testing due to fatigue  End of Session PT - End of Session Activity Tolerance: Patient tolerated treatment well Patient left: in chair;with call bell/phone within reach;with family/visitor present  GP     Toney Sang Beth 02/25/2013, 3:08 PM  Delaney Meigs, PT [phone]

## 2013-02-25 NOTE — Progress Notes (Signed)
Subjective:  C/o mild nausea and constipation.  Not SOB.  Does not feel as well today  Objective:  Vital Signs in the last 24 hours: BP 99/56  Pulse 83  Temp(Src) 97.6 F (36.4 C) (Oral)  Resp 18  Ht 5\' 7"  (1.702 m)  Wt 55.566 kg (122 lb 8 oz)  BMI 19.18 kg/m2  SpO2 95%  Physical Exam: Pleasant WM in NAD Lungs:  Reduced breath sounds on the left, mild rales on left Cardiac:  iregular rhythm, normal S1 and S2, no S3 1-2 6 systolic murmur Abdomen:  Soft, nontender, no masses Extremities:  No edema present  Intake/Output from previous day: 03/05 0701 - 03/06 0700 In: 960 [P.O.:960] Out: 2200 [Urine:2200] Weight Filed Weights   02/24/13 0438 02/24/13 2100 02/25/13 0458  Weight: 57.1 kg (125 lb 14.1 oz) 58.8 kg (129 lb 10.1 oz) 55.566 kg (122 lb 8 oz)    Lab Results: Basic Metabolic Panel:  Recent Labs  16/10/96 0638 02/25/13 0530  NA 133* 135  K 3.7 3.3*  CL 95* 95*  CO2 29 28  GLUCOSE 115* 105*  BUN 39* 36*  CREATININE 1.66* 1.55*    CBC:  Recent Labs  02/23/13 0738 02/24/13 0638  WBC 14.3* 7.8  HGB 13.3 12.3*  HCT 38.8* 36.1*  MCV 87.0 85.1  PLT 226 204    BNP    Component Value Date/Time   PROBNP 1108.0* 02/22/2013 1610    PROTIME: Lab Results  Component Value Date   INR 1.46 02/25/2013   INR 1.63* 02/24/2013   INR 1.78* 02/23/2013    Telemetry: Atrial fibrillation with controlled response  Assessment/Plan:  1. Chronic atrial fibrillation 2. CAD with prior stent to the circ 3. Hyperlipidemia with statin intolerance 4. Severe COPD 5. Left pleural effusion awaiting cytology  Rec:  Restart warfarin to get INR up again. On Oral diltiazem now.  May go home when pulmonary workup complete and patient stable.   Darden Palmer  MD [facility] Cardiology  02/25/2013, 9:13 AM

## 2013-02-25 NOTE — Progress Notes (Signed)
02/25/13 1530 Noted new orders for [facility] PT.  TC to Lupita Leash, with [facility] to give referral, and to make aware that [facility] RN needs to draw a PT/INR on Monday and fax orders to Dr. Alphonsus Sias, pt.'s PCP.  Pt. to dc home today. Tera Mater, RN, BSN NCM [phone]

## 2013-02-25 NOTE — Discharge Summary (Signed)
Physician Discharge Summary  [name] GNF:[id number] DOB: [birth year] DOA: 02/21/2013  PCP: Tillman Abide, MD  Admit date: 02/21/2013 Discharge date: 02/25/2013  Time spent: Greater than 30 minutes  Recommendations for Outpatient Follow-up:  1. With Dr. Tillman Abide, PCP in 1 week. 2. WIth Dr. Donnie Aho, Cardiology 3. Home Health RN, PT and shower chair. RN will draw labs (BMP, PT & INR) on 03/01/13 and forward results to PCP for Coumadin dose adjustment and other medication related changes. 4. ACEI held due to Acute on chronic renal failure. Consider resuming as OP when deemed appropriate. 5. Follow up TSH. 6. Followup chest x-ray as outpatient in a couple of weeks.  Discharge Diagnoses:  Principal Problem:   Acute respiratory failure Active Problems:   CAD (coronary artery disease)   Atrial fibrillation   COPD   Hypothyroidism   Pleural effusion, left   Acute CHF   Acute on chronic renal failure   Hypoxemia   Chronic kideny disease stage III  (GFR 30-59 ml/min)   Long-term (current) use of anticoagulants   Discharge Condition: Improved & Stable  Diet recommendation: Heart Healthy  Filed Weights   02/24/13 0438 02/24/13 2100 02/25/13 0458  Weight: 57.1 kg (125 lb 14.1 oz) 58.8 kg (129 lb 10.1 oz) 55.566 kg (122 lb 8 oz)    History of present illness:  Pt is 79 y.o. male with a history of COPD, coronary artery disease with stent in past, atrial fibrillation, presented to [facility] ED with progressively worsening shortness of breath that initially started 2-3 weeks prior to this admission, associated with productive cough of clear - gray sputum, left sided chest pain, 3 pillow orthopnea, chills, generalized weakness. Pt denies similar events in the past. In ED, CXR findings indicating left sided pleural effusion, ? PNA. TRH asked to admit to telemetry bed.   Hospital Course:  1. Acute respiratory failure: secondary to left side pleural effusion. Pulmonary consulted and performed  therapeutic and diagnostic thoracentesis. Pleural fluid is transudate likely related to AECHF rather than PNA. Called Pathology lab (Ms. Tammy) and was advised that cytology did not show malignant cells (results not seen on EPIC apparently due to some technical glitch). Patient is asymptomatic of dyspnea. Will complete a week of Levaquin that he had been empirically started in hospital. 2. Chronic atrial fibrillation with RVR: this was likely precipitated by acute pulmonary issues. Placed on Cardizem drip and BB were continued. RVR resolved. He was switched to PO Cardizem. Coumadin which was held for the thoracentesis was resumed. INR is still subtherapeutic. OP follow up of INR in a couple of days. Cardiology consulted. 3. COPD: Stable.   4. Acute CHF, systolic with EF 84%: Compensated.  Continue Lasix and follow BMP closely as OP.Cardiology consulted during hospitalization and cleared for d/c. 5. Acute on chronic renal failure stage II-3: Creatinine has plateaued in the 1.6 range. Baseline creatinine probably 1.3 or less. Monitor creatinine closely while on higher doses of Lasix. ACEI temporarily held due to elevated creatinine. Consider resuming ACEI as OP if creatinine improves or stabilizes. 6. HYPOTHYROIDISM: TSH elevated. Clinically euthyroid.Recommend repeating TSH in 4-6 weeks. 7. CORONARY ARTERY DISEASE: Contiue Aspirin    Procedures:  Thoracentesis   Consultations:  Cardiology  Pulmonology  Discharge Exam:  Complaints: Denies dyspnea or chest pain. Wishes to go home.  Filed Vitals:   02/25/13 0458 02/25/13 0900 02/25/13 1041 02/25/13 1452  BP: 99/56  108/60 93/63  Pulse: 83  100 90  Temp: 97.6 F (36.4 C)  97.3 F (36.3 C)  TempSrc: Oral   Oral  Resp: 18  18 18   Height:      Weight: 55.566 kg (122 lb 8 oz)     SpO2: 96% 95% 96% 96%     General exam: Comfortable.  Respiratory system: Slightly reduced BS's in bases, L>R. Rest of lung fields clear. No increased work  of breathing.  Cardiovascular system: S1 and S2 heard, Irregularly irregular- controlled ventricular rate. No JVD, murmurs or pedal edema.  Gastrointestinal system: Abdomen is nondistended, soft and nontender. Normal bowel sounds heard.  Central nervous system: Alert and oriented. No focal neurological deficits.  Extremities: Symmetric 5 x 5 power.  Discharge Instructions      Discharge Orders   Future Appointments [facility] Department Dept Phone   03/18/2013 8:00 AM [facility] [place] [facility] [place] [phone]   08/03/2013 9:00 AM Karie Schwalbe, MD  HealthCare at [facility] [phone]   Future Orders Complete By Expires     (HEART FAILURE PATIENTS) Call MD:  Anytime you have any of the following symptoms: 1) 3 pound weight gain in 24 hours or 5 pounds in 1 week 2) shortness of breath, with or without a dry hacking cough 3) swelling in the hands, feet or stomach 4) if you have to sleep on extra pillows at night in order to breathe.  As directed     Call MD for:  difficulty breathing, headache or visual disturbances  As directed     Call MD for:  severe uncontrolled pain  As directed     Call MD for:  temperature >100.4  As directed     Diet - low sodium heart healthy  As directed     Increase activity slowly  As directed         Medication List    STOP taking these medications       ramipril 5 MG capsule  Commonly known as:  ALTACE      TAKE these medications       albuterol 108 (90 BASE) MCG/ACT inhaler  Commonly known as:  PROVENTIL HFA  Inhale 2 puffs into the lungs every 4 (four) hours as needed for wheezing.     amitriptyline 25 MG tablet  Commonly known as:  ELAVIL  Take 25 mg by mouth at bedtime.     aspirin 81 MG tablet  Take 81 mg by mouth every morning.     carvedilol 3.125 MG tablet  Commonly known as:  COREG  Take 1 tablet (3.125 mg total) by mouth 2 (two) times daily with a meal.     diltiazem 180 MG 24 hr  capsule  Commonly known as:  CARDIZEM CD  Take 180 mg by mouth every morning.     fluticasone 50 MCG/ACT nasal spray  Commonly known as:  FLONASE  Place 2 sprays into the nose daily. 2 spray, Each Nare, Daily     furosemide 40 MG tablet  Commonly known as:  LASIX  Take 1 tablet (40 mg total) by mouth 2 (two) times daily.     HYDROcodone-acetaminophen 5-325 MG per tablet  Commonly known as:  NORCO/VICODIN  Take 1 tablet by mouth 3 (three) times daily as needed. For pain     levofloxacin 750 MG tablet  Commonly known as:  LEVAQUIN  Take 1 tablet (750 mg total) by mouth every other day.     levothyroxine 25 MCG tablet  Commonly known as:  SYNTHROID, LEVOTHROID  Take 25 mcg by mouth every morning.     nitroGLYCERIN 0.4 MG SL tablet  Commonly known as:  NITROSTAT  Place 0.4 mg under the tongue every 5 (five) minutes as needed for chest pain. x3 doses for for chest pain     potassium chloride SA 20 MEQ tablet  Commonly known as:  K-DUR,KLOR-CON  Take 1 tablet (20 mEq total) by mouth daily.     pravastatin 20 MG tablet  Commonly known as:  PRAVACHOL  Take 20 mg by mouth every morning.     RAPAFLO 8 MG Caps capsule  Generic drug:  silodosin  Take 8 mg by mouth every morning.     tiotropium 18 MCG inhalation capsule  Commonly known as:  SPIRIVA  Place 18 mcg into inhaler and inhale daily.     warfarin 2.5 MG tablet  Commonly known as:  COUMADIN  Take 2.5 mg by mouth 2 (two) times a week. Takes on Tues and Thurs in the morning     warfarin 5 MG tablet  Commonly known as:  COUMADIN  Take 5 mg by mouth See admin instructions. Take on Mon, Wed, Fri, Sat & Sun. Takes in the morning.       Follow-up Information   Follow up with [facility]. [facility] Nurse for Heart Failure Management and lab draws.)    Contact information:   [phone]      Follow up with Tillman Abide, MD. Schedule an appointment as soon as possible for a visit in 1 week.   Contact  information:   [address] [place] [address] [phone]       Schedule an appointment as soon as possible for a visit with Darden Palmer, MD.   Contact information:   [address] [place] [address] [phone]        The results of significant diagnostics from this hospitalization (including imaging, microbiology, ancillary and laboratory) are listed below for reference.    Significant Diagnostic Studies: Dg Chest 2 View  02/21/2013  *RADIOLOGY REPORT*  Clinical Data: Shortness of breath, chest pain  CHEST - 2 VIEW  Comparison: 01/27/2011  Findings: Chronic interstitial markings/emphysematous changes. Moderate left pleural effusion.  Associated left lower lobe opacity, atelectasis versus pneumonia.  The heart is normal in size.  Mild degenerative changes of the visualized thoracolumbar spine.  IMPRESSION: Moderate left pleural effusion.  Associated left lower lobe opacity, atelectasis versus pneumonia.   Original Report Authenticated By: Charline Bills, M.D.    Ct Chest Wo Contrast  02/23/2013  *RADIOLOGY REPORT*  Clinical Data: Cough, left-sided pneumonia versus atelectasis, left pleural effusion  CT CHEST WITHOUT CONTRAST  Technique:  Multidetector CT imaging of the chest was performed following the standard protocol without IV contrast.  Comparison: Chest radiographs dated 02/21/2013.  CT chest dated 11/20/2008.  Findings: Small to moderate left pleural effusion.  Associated compressive atelectasis in the left lower lobe.  Biapical pleural parenchymal scarring.  Extensive paraseptal and centrilobular emphysematous changes.  Mild scarring at the medial right lung base.  No suspicious pulmonary nodules.  No pneumothorax.  Visualized thyroid is unremarkable.  There is normal in size.  No pericardial effusion.  Coronary atherosclerosis.  Atherosclerotic calcifications of the aortic arch.  No suspicious mediastinal or axillary lymphadenopathy.  The  visualized upper abdomen is grossly unremarkable.  Mild degenerative changes of the visualized thoracolumbar spine.  IMPRESSION: Small to moderate left pleural effusion.  Associated left lower lobe atelectasis.  Underlying extensive emphysematous changes with biapical pleural parenchymal scarring.   Original Report Authenticated By: Charline Bills, M.D.    Dg Chest Port 1 View  02/23/2013  *RADIOLOGY REPORT*  Clinical Data: Left thoracentesis  PORTABLE CHEST - 1 VIEW  Comparison: Earlier today  Findings: No convincing pneumothorax post thoracentesis. Interstitial changes and hyperaeration persist.  Volume loss at the left base has improved.  Normal heart size.  IMPRESSION: No pneumothorax post thoracentesis.   Original Report Authenticated By: Jolaine Click, M.D.     Microbiology: Recent Results (from the past 240 hour(s))  CULTURE, BLOOD (ROUTINE X 2)     Status: None   Collection Time    02/22/13  1:45 PM      Result Value Range Status   Specimen Description BLOOD ARM LEFT   Final   Special Requests BOTTLES DRAWN AEROBIC AND ANAEROBIC 10CC   Final   Culture  Setup Time 02/22/2013 19:06   Final   Culture     Final   Value:        BLOOD CULTURE RECEIVED NO GROWTH TO DATE CULTURE WILL BE HELD FOR 5 DAYS BEFORE ISSUING A FINAL NEGATIVE REPORT   Report Status PENDING   Incomplete  CULTURE, BLOOD (ROUTINE X 2)     Status: None   Collection Time    02/22/13  2:00 PM      Result Value Range Status   Specimen Description BLOOD ARM RIGHT   Final   Special Requests BOTTLES DRAWN AEROBIC AND ANAEROBIC 10CC   Final   Culture  Setup Time 02/22/2013 19:08   Final   Culture     Final   Value:        BLOOD CULTURE RECEIVED NO GROWTH TO DATE CULTURE WILL BE HELD FOR 5 DAYS BEFORE ISSUING A FINAL NEGATIVE REPORT   Report Status PENDING   Incomplete  CULTURE, EXPECTORATED SPUTUM-ASSESSMENT     Status: None   Collection Time    02/23/13  7:01 AM      Result Value Range Status   Specimen Description  SPUTUM   Final   Special Requests NONE   Final   Sputum evaluation     Final   Value: MICROSCOPIC FINDINGS SUGGEST THAT THIS SPECIMEN IS NOT REPRESENTATIVE OF LOWER RESPIRATORY SECRETIONS. PLEASE RECOLLECT.     CALLED TO N BUCK,RN 02/23/13 0836 BY K SCHULTZ   Report Status 02/23/2013 FINAL   Final  BODY FLUID CULTURE     Status: None   Collection Time    02/23/13  3:24 PM      Result Value Range Status   Specimen Description PLEURAL FLUID LEFT   Final   Special Requests FLUID   Final   Gram Stain     Final   Value: MODERATE WBC PRESENT, PREDOMINANTLY PMN     NO ORGANISMS SEEN   Culture NO GROWTH 2 DAYS   Final   Report Status PENDING   Incomplete     Labs: Basic Metabolic Panel:  Recent Labs Lab 02/21/13 1813 02/22/13 0135 02/23/13 0738 02/24/13 0638 02/25/13 0530  NA 136 136 134* 133* 135  K 4.9 4.2 4.0 3.7 3.3*  CL 106 100 97 95* 95*  CO2  --  25 24 29 28   GLUCOSE 93 76 123* 115* 105*  BUN 24* 25* 36* 39* 36*  CREATININE 1.60* 1.59* 1.64* 1.66* 1.55*  CALCIUM  --  9.7 10.0 9.5 9.5   Liver Function Tests:  Recent Labs Lab  02/22/13 0135  AST 18  ALT 15  ALKPHOS 93  BILITOT 0.4  PROT 7.3  ALBUMIN 3.5   No results found for this basename: LIPASE, AMYLASE,  in the last 168 hours No results found for this basename: AMMONIA,  in the last 168 hours CBC:  Recent Labs Lab 02/21/13 1813 02/22/13 0135 02/23/13 0738 02/24/13 0638  WBC  --  6.8 14.3* 7.8  HGB 11.9* 12.9* 13.3 12.3*  HCT 35.0* 38.7* 38.8* 36.1*  MCV  --  89.6 87.0 85.1  PLT  --  208 226 204   Cardiac Enzymes:  Recent Labs Lab 02/22/13 0042 02/22/13 0758  TROPONINI <0.30 <0.30   BNP: BNP (last 3 results)  Recent Labs  02/21/13 1738 02/22/13 1610  PROBNP 890.8* 1108.0*   CBG: No results found for this basename: GLUCAP,  in the last 168 hours  Additional labs:  INR 02/25/2013: 1.46  TSH 7.391 ( in feb TSH 5.08 but FT$ normal)  Urine Legionella & Strep Ag: neg  2 D  Echo:  Study Conclusions  - Left ventricle: The cavity size was normal. Systolic  function was mildly reduced. The estimated ejection fraction was in the range of 45% to 50%. Regional wall motion abnormalities cannot be excluded. - Atrial septum: No defect or patent foramen ovale was identified. - Pulmonary arteries: Systolic pressure was mildly increased. PA peak pressure: 35mm Hg (S).   SignedMarcellus Scott  [facility] 02/25/2013, 3:29 PM

## 2013-02-25 NOTE — Progress Notes (Signed)
ANTICOAGULATION CONSULT NOTE - Follow Up Consult  Pharmacy Consult for Coumadin Indication: atrial fibrillation  Patient Measurements: Height: 5\' 7"  (170.2 cm) Weight: 122 lb 8 oz (55.566 kg) (scale b) IBW/kg (Calculated) : 66.1  Vital Signs: Temp: 97.6 F (36.4 C) (03/06 0458) Temp src: Oral (03/06 0458) BP: 108/60 mmHg (03/06 1041) Pulse Rate: 100 (03/06 1041)  Labs:  Recent Labs  02/23/13 0738 02/24/13 7829 02/25/13 0530  HGB 13.3 12.3*  --   HCT 38.8* 36.1*  --   PLT 226 204  --   LABPROT 20.1* 18.8* 17.3*  INR 1.78* 1.63* 1.46  CREATININE 1.64* 1.66* 1.55*    Estimated Creatinine Clearance: 28.9 ml/min (by C-G formula based on Cr of 1.55).  Assessment:  S/p thoracentesis 3/4. Coumadin held 3/2-2/4, thenr esumed 3/5 with 7.5 mg dose. INR has dropped some, as expected. INR therapeutic (2.51) on admission 3/2, on home Coumadin regimen of 5 mg daily except 2.5 mg on Tues & Thurs.  Day # 4  Levaquin 750 mg IV q48hrs, sometimes effects INR.  Dose due tonight. Also on Lovenox 30 mg SQ Q24hrs.  Goal of Therapy:  INR 2-3 Monitor platelets by anticoagulation protocol: Yes   Plan:   Repeat Coumadin 7.5 mg today.  Continue daily PT/INR.  Continue Lovenox 30 mg SQ q24hrs.  Will watch for Levaquin effect on INR, and length of therapy.  Dennie Fetters, RPh Pager: [phone] 02/25/2013,10:50 AM

## 2013-02-25 NOTE — Progress Notes (Signed)
02/25/13  Patient discharged with all paperwork and verbalizes understanding of prescriptions and MD appointments. Anastasia Fiedler RN.

## 2013-02-25 NOTE — Telephone Encounter (Signed)
Just discharged today Reviewed summary and labs----pleural fluid cytology not final Will set up appt for 10 days or so---when I am back   [place], Please set him up for when I get back  15 minute appt okay

## 2013-02-27 LAB — BODY FLUID CULTURE

## 2013-02-28 LAB — CULTURE, BLOOD (ROUTINE X 2)

## 2013-03-02 ENCOUNTER — Encounter: Payer: Self-pay | Admitting: Internal Medicine

## 2013-03-08 ENCOUNTER — Other Ambulatory Visit: Payer: Self-pay | Admitting: Internal Medicine

## 2013-03-08 NOTE — Telephone Encounter (Signed)
rx called into pharmacy

## 2013-03-08 NOTE — Telephone Encounter (Signed)
Okay #90 x 0 

## 2013-03-09 DIAGNOSIS — Z48813 Encounter for surgical aftercare following surgery on the respiratory system: Secondary | ICD-10-CM

## 2013-03-09 DIAGNOSIS — I4891 Unspecified atrial fibrillation: Secondary | ICD-10-CM

## 2013-03-09 DIAGNOSIS — J441 Chronic obstructive pulmonary disease with (acute) exacerbation: Secondary | ICD-10-CM

## 2013-03-09 DIAGNOSIS — I509 Heart failure, unspecified: Secondary | ICD-10-CM

## 2013-03-11 ENCOUNTER — Ambulatory Visit (INDEPENDENT_AMBULATORY_CARE_PROVIDER_SITE_OTHER): Payer: Medicare Other | Admitting: Internal Medicine

## 2013-03-11 ENCOUNTER — Encounter: Payer: Self-pay | Admitting: Internal Medicine

## 2013-03-11 VITALS — BP 100/60 | HR 81 | Temp 98.3°F | Wt 128.0 lb

## 2013-03-11 NOTE — Assessment & Plan Note (Signed)
EF okay Is on furosemide May have become apparent due to infection

## 2013-03-11 NOTE — Assessment & Plan Note (Signed)
Reasonable rate No changes On coumadin

## 2013-03-11 NOTE — Assessment & Plan Note (Signed)
Better Seems to have been from pneumonia and the effusion Reassured--no cancer

## 2013-03-11 NOTE — Assessment & Plan Note (Signed)
Will recheck labs 

## 2013-03-11 NOTE — Assessment & Plan Note (Signed)
Still some based on his exam Will recheck CXR at follow up

## 2013-03-11 NOTE — Progress Notes (Signed)
Subjective:    Patient ID: [name], male    DOB: Dec 24, 1927, 79 y.o.   MRN: [id number]  HPI Here with wife Still feels weak Breathing has been okay Not much cough No fever  Still has some leg pain--this is not new but may be worse Has sense of weakness and "needles" sensation Generally uses a cane  No chest pain No palpitations No edema  Current Outpatient Prescriptions on File Prior to Visit  Medication Sig Dispense Refill  . albuterol (PROVENTIL HFA) 108 (90 BASE) MCG/ACT inhaler Inhale 2 puffs into the lungs every 4 (four) hours as needed for wheezing.  6 each  2  . amitriptyline (ELAVIL) 25 MG tablet Take 25 mg by mouth at bedtime.      Marland Kitchen aspirin 81 MG tablet Take 81 mg by mouth every morning.       . carvedilol (COREG) 3.125 MG tablet Take 1 tablet (3.125 mg total) by mouth 2 (two) times daily with a meal.  60 tablet  0  . diltiazem (CARDIZEM CD) 180 MG 24 hr capsule Take 180 mg by mouth every morning.       . fluticasone (FLONASE) 50 MCG/ACT nasal spray Place 2 sprays into the nose daily. 2 spray, Each Nare, Daily  16 g  0  . furosemide (LASIX) 40 MG tablet Take 1 tablet (40 mg total) by mouth 2 (two) times daily.  60 tablet  0  . HYDROcodone-acetaminophen (NORCO/VICODIN) 5-325 MG per tablet TAKE 1 TABLET BY MOUTH 3 TIMES A DAY AS NEEDED  90 tablet  0  . levofloxacin (LEVAQUIN) 750 MG tablet Take 1 tablet (750 mg total) by mouth every other day.  2 tablet  0  . levothyroxine (SYNTHROID, LEVOTHROID) 25 MCG tablet Take 25 mcg by mouth every morning.      . nitroGLYCERIN (NITROSTAT) 0.4 MG SL tablet Place 0.4 mg under the tongue every 5 (five) minutes as needed for chest pain. x3 doses for for chest pain      . potassium chloride SA (K-DUR,KLOR-CON) 20 MEQ tablet Take 1 tablet (20 mEq total) by mouth daily.  30 tablet  0  . pravastatin (PRAVACHOL) 20 MG tablet Take 20 mg by mouth every morning.       Marland Kitchen RAPAFLO 8 MG CAPS Take 8 mg by mouth every morning.       . tiotropium  (SPIRIVA) 18 MCG inhalation capsule Place 18 mcg into inhaler and inhale daily.      Marland Kitchen warfarin (COUMADIN) 2.5 MG tablet Take 2.5 mg by mouth 2 (two) times a week. Takes on Tues and Thurs in the morning      . warfarin (COUMADIN) 5 MG tablet Take 5 mg by mouth See admin instructions. Take on Mon, Wed, Fri, Sat & Sun. Takes in the morning.       No current facility-administered medications on file prior to visit.    Allergies  Allergen Reactions  . Atorvastatin Other (See Comments)    REACTION: severe muscle aches  . Ezetimibe-Simvastatin Other (See Comments)    REACTION: severe muscle aches  . Fenofibrate     REACTION: "ran me up a wall"  . Penicillins Other (See Comments)    blisters    Past Medical History  Diagnosis Date  . Allergy   . CAD (coronary artery disease)   . Hyperlipidemia   . Kidney stones   . BPH (benign prostatic hypertrophy)   . Osteoarthritis   . COPD (chronic obstructive pulmonary disease)   .  Hypothyroidism   . Atrial fibrillation   . Impaired fasting glucose     Past Surgical History  Procedure Laterality Date  . Inguinal hernia repair  09/2004    left  . Back surgery  1990's    x3  . Transurethral resection of prostate  2002  . Pleural scarification  1980    right  . Esophagogastroduodenoscopy  04/2002    negative  . Bladder stone removal  06/2006    Family History  Problem Relation Age of Onset  . Heart disease Mother   . Heart disease Brother   . Stroke Brother   . Cancer Neg Hx   . Diabetes Neg Hx   . Hypertension Neg Hx   . Heart disease Brother   . Stroke Brother   . Heart disease Brother   . Stroke Brother     History   Social History  . Marital Status: Married    Spouse Name: N/A    Number of Children: 1  . Years of Education: N/A   Occupational History  . retired Therapist, music    Social History Main Topics  . Smoking status: Former Smoker    Types: Cigarettes    Quit date: 12/23/2001  . Smokeless tobacco: Never  Used  . Alcohol Use: No  . Drug Use: No  . Sexually Active: Not on file   Other Topics Concern  . Not on file   Social History Narrative   Does lots of yard work and a big garden   Has living will   No [facility] care POA. Asks for daughter Ardeen Fillers to make decisions if needed.   Discussed DNR--- he doesn't want artificial ventilation but isn't clear about this.   Not sure about tube feeds   Review of Systems Appetite is good Sleeping okay Weight fluctuates some---gained some back after losing    Objective:   Physical Exam  Constitutional: He appears well-developed and well-nourished. No distress.  Neck: Normal range of motion. Neck supple.  Cardiovascular: Normal rate and normal heart sounds.  Exam reveals no gallop.   No murmur heard. irregular  Pulmonary/Chest: Effort normal. No respiratory distress. He has no wheezes. He has no rales.  Dullness at left base Slight decreased breath sounds  Musculoskeletal: He exhibits no edema.  Lymphadenopathy:    He has no cervical adenopathy.  Psychiatric: He has a normal mood and affect. His behavior is normal.          Assessment & Plan:

## 2013-03-12 ENCOUNTER — Ambulatory Visit: Payer: Medicare Other | Admitting: Internal Medicine

## 2013-03-12 ENCOUNTER — Encounter: Payer: Self-pay | Admitting: Cardiology

## 2013-03-12 LAB — CBC WITH DIFFERENTIAL/PLATELET
Basophils Relative: 2.4 % (ref 0.0–3.0)
Eosinophils Relative: 6 % — ABNORMAL HIGH (ref 0.0–5.0)
HCT: 38.9 % — ABNORMAL LOW (ref 39.0–52.0)
Lymphs Abs: 0.9 10*3/uL (ref 0.7–4.0)
MCV: 88.6 fl (ref 78.0–100.0)
Monocytes Absolute: 0.7 10*3/uL (ref 0.1–1.0)
Monocytes Relative: 8.9 % (ref 3.0–12.0)
Neutrophils Relative %: 70.5 % (ref 43.0–77.0)
Platelets: 237 10*3/uL (ref 150.0–400.0)
RBC: 4.39 Mil/uL (ref 4.22–5.81)
WBC: 7.6 10*3/uL (ref 4.5–10.5)

## 2013-03-12 LAB — BASIC METABOLIC PANEL
Chloride: 102 mEq/L (ref 96–112)
GFR: 41.41 mL/min — ABNORMAL LOW (ref >60.00)
Potassium: 4.6 mEq/L (ref 3.5–5.1)

## 2013-03-15 ENCOUNTER — Encounter: Payer: Self-pay | Admitting: *Deleted

## 2013-03-18 ENCOUNTER — Ambulatory Visit (INDEPENDENT_AMBULATORY_CARE_PROVIDER_SITE_OTHER): Payer: Medicare Other | Admitting: General Practice

## 2013-03-18 DIAGNOSIS — Z5181 Encounter for therapeutic drug level monitoring: Secondary | ICD-10-CM

## 2013-03-18 DIAGNOSIS — I4891 Unspecified atrial fibrillation: Secondary | ICD-10-CM

## 2013-03-18 DIAGNOSIS — Z7901 Long term (current) use of anticoagulants: Secondary | ICD-10-CM

## 2013-03-18 LAB — POCT INR: INR: 2.7

## 2013-03-18 NOTE — Patient Instructions (Signed)
Continue to take 5 mg daily , except 2.5 mg Tues/Thurs, check in 4 weeks

## 2013-04-08 ENCOUNTER — Inpatient Hospital Stay (HOSPITAL_COMMUNITY)
Admission: EM | Admit: 2013-04-08 | Discharge: 2013-04-16 | DRG: 291 | Disposition: A | Discharge status: Home / Self Care | Payer: Medicare Other | Attending: Internal Medicine | Admitting: Internal Medicine

## 2013-04-08 ENCOUNTER — Encounter: Payer: Self-pay | Admitting: Cardiology

## 2013-04-08 ENCOUNTER — Encounter (HOSPITAL_COMMUNITY): Payer: Self-pay | Admitting: Emergency Medicine

## 2013-04-08 ENCOUNTER — Ambulatory Visit: Payer: Medicare Other | Admitting: Family Medicine

## 2013-04-08 ENCOUNTER — Emergency Department (HOSPITAL_COMMUNITY): Payer: Medicare Other

## 2013-04-08 DIAGNOSIS — I5033 Acute on chronic diastolic (congestive) heart failure: Principal | ICD-10-CM | POA: Diagnosis present

## 2013-04-08 DIAGNOSIS — Z8249 Family history of ischemic heart disease and other diseases of the circulatory system: Secondary | ICD-10-CM

## 2013-04-08 DIAGNOSIS — Z888 Allergy status to other drugs, medicaments and biological substances status: Secondary | ICD-10-CM

## 2013-04-08 DIAGNOSIS — G589 Mononeuropathy, unspecified: Secondary | ICD-10-CM

## 2013-04-08 DIAGNOSIS — L570 Actinic keratosis: Secondary | ICD-10-CM

## 2013-04-08 DIAGNOSIS — Z681 Body mass index (BMI) 19 or less, adult: Secondary | ICD-10-CM

## 2013-04-08 DIAGNOSIS — K5900 Constipation, unspecified: Secondary | ICD-10-CM | POA: Diagnosis not present

## 2013-04-08 DIAGNOSIS — N400 Enlarged prostate without lower urinary tract symptoms: Secondary | ICD-10-CM

## 2013-04-08 DIAGNOSIS — I4891 Unspecified atrial fibrillation: Secondary | ICD-10-CM

## 2013-04-08 DIAGNOSIS — Z8709 Personal history of other diseases of the respiratory system: Secondary | ICD-10-CM

## 2013-04-08 DIAGNOSIS — J90 Pleural effusion, not elsewhere classified: Secondary | ICD-10-CM

## 2013-04-08 DIAGNOSIS — R1031 Right lower quadrant pain: Secondary | ICD-10-CM | POA: Diagnosis not present

## 2013-04-08 DIAGNOSIS — Z87891 Personal history of nicotine dependence: Secondary | ICD-10-CM

## 2013-04-08 DIAGNOSIS — I2510 Atherosclerotic heart disease of native coronary artery without angina pectoris: Secondary | ICD-10-CM

## 2013-04-08 DIAGNOSIS — R0600 Dyspnea, unspecified: Secondary | ICD-10-CM

## 2013-04-08 DIAGNOSIS — I498 Other specified cardiac arrhythmias: Secondary | ICD-10-CM | POA: Diagnosis not present

## 2013-04-08 DIAGNOSIS — N183 Chronic kidney disease, stage 3 unspecified: Secondary | ICD-10-CM

## 2013-04-08 DIAGNOSIS — E039 Hypothyroidism, unspecified: Secondary | ICD-10-CM

## 2013-04-08 DIAGNOSIS — I959 Hypotension, unspecified: Secondary | ICD-10-CM | POA: Diagnosis not present

## 2013-04-08 DIAGNOSIS — I129 Hypertensive chronic kidney disease with stage 1 through stage 4 chronic kidney disease, or unspecified chronic kidney disease: Secondary | ICD-10-CM | POA: Diagnosis present

## 2013-04-08 DIAGNOSIS — K559 Vascular disorder of intestine, unspecified: Secondary | ICD-10-CM | POA: Diagnosis present

## 2013-04-08 DIAGNOSIS — J9600 Acute respiratory failure, unspecified whether with hypoxia or hypercapnia: Secondary | ICD-10-CM

## 2013-04-08 DIAGNOSIS — K55059 Acute (reversible) ischemia of intestine, part and extent unspecified: Secondary | ICD-10-CM | POA: Diagnosis not present

## 2013-04-08 DIAGNOSIS — E785 Hyperlipidemia, unspecified: Secondary | ICD-10-CM

## 2013-04-08 DIAGNOSIS — R0902 Hypoxemia: Secondary | ICD-10-CM | POA: Diagnosis present

## 2013-04-08 DIAGNOSIS — Z823 Family history of stroke: Secondary | ICD-10-CM

## 2013-04-08 DIAGNOSIS — R64 Cachexia: Secondary | ICD-10-CM | POA: Diagnosis present

## 2013-04-08 DIAGNOSIS — R7301 Impaired fasting glucose: Secondary | ICD-10-CM

## 2013-04-08 DIAGNOSIS — I5032 Chronic diastolic (congestive) heart failure: Secondary | ICD-10-CM | POA: Diagnosis present

## 2013-04-08 DIAGNOSIS — K219 Gastro-esophageal reflux disease without esophagitis: Secondary | ICD-10-CM | POA: Diagnosis present

## 2013-04-08 DIAGNOSIS — M159 Polyosteoarthritis, unspecified: Secondary | ICD-10-CM

## 2013-04-08 DIAGNOSIS — M519 Unspecified thoracic, thoracolumbar and lumbosacral intervertebral disc disorder: Secondary | ICD-10-CM

## 2013-04-08 DIAGNOSIS — J4489 Other specified chronic obstructive pulmonary disease: Secondary | ICD-10-CM

## 2013-04-08 DIAGNOSIS — I509 Heart failure, unspecified: Secondary | ICD-10-CM

## 2013-04-08 DIAGNOSIS — Z7901 Long term (current) use of anticoagulants: Secondary | ICD-10-CM

## 2013-04-08 DIAGNOSIS — N200 Calculus of kidney: Secondary | ICD-10-CM

## 2013-04-08 DIAGNOSIS — J441 Chronic obstructive pulmonary disease with (acute) exacerbation: Secondary | ICD-10-CM | POA: Diagnosis present

## 2013-04-08 DIAGNOSIS — J449 Chronic obstructive pulmonary disease, unspecified: Secondary | ICD-10-CM | POA: Diagnosis present

## 2013-04-08 DIAGNOSIS — I252 Old myocardial infarction: Secondary | ICD-10-CM

## 2013-04-08 DIAGNOSIS — Z880 Allergy status to penicillin: Secondary | ICD-10-CM

## 2013-04-08 DIAGNOSIS — J9601 Acute respiratory failure with hypoxia: Secondary | ICD-10-CM | POA: Diagnosis present

## 2013-04-08 HISTORY — DX: Gastro-esophageal reflux disease without esophagitis: K21.9

## 2013-04-08 HISTORY — DX: Acute myocardial infarction, unspecified: I21.9

## 2013-04-08 HISTORY — DX: Heart failure, unspecified: I50.9

## 2013-04-08 HISTORY — DX: Essential (primary) hypertension: I10

## 2013-04-08 HISTORY — DX: Shortness of breath: R06.02

## 2013-04-08 LAB — BASIC METABOLIC PANEL
BUN: 18 mg/dL (ref 6–23)
CO2: 26 mEq/L (ref 19–32)
Calcium: 9.6 mg/dL (ref 8.4–10.5)
Chloride: 104 mEq/L (ref 96–112)
Creatinine, Ser: 1.07 mg/dL (ref 0.50–1.35)
GFR calc Af Amer: 73 mL/min — ABNORMAL LOW (ref >90)
GFR calc non Af Amer: 63 mL/min — ABNORMAL LOW (ref >90)
Glucose, Bld: 102 mg/dL — ABNORMAL HIGH (ref 70–99)
Potassium: 4.8 mEq/L (ref 3.5–5.1)
Sodium: 137 mEq/L (ref 135–145)

## 2013-04-08 LAB — CBC
HCT: 37.4 % — ABNORMAL LOW (ref 39.0–52.0)
Hemoglobin: 12.7 g/dL — ABNORMAL LOW (ref 13.0–17.0)
MCH: 29.3 pg (ref 26.0–34.0)
MCHC: 34 g/dL (ref 30.0–36.0)
MCV: 86.2 fL (ref 78.0–100.0)
Platelets: 228 10*3/uL (ref 150–400)
RBC: 4.34 MIL/uL (ref 4.22–5.81)
RDW: 14.8 % (ref 11.5–15.5)
WBC: 6.9 10*3/uL (ref 4.0–10.5)

## 2013-04-08 LAB — TROPONIN I
Troponin I: <0.3 ng/mL (ref <0.30)
Troponin I: <0.3 ng/mL (ref <0.30)

## 2013-04-08 LAB — PROTIME-INR
INR: 2.8 — ABNORMAL HIGH (ref 0.00–1.49)
Prothrombin Time: 28.1 seconds — ABNORMAL HIGH (ref 11.6–15.2)

## 2013-04-08 LAB — PRO B NATRIURETIC PEPTIDE: Pro B Natriuretic peptide (BNP): 1755 pg/mL — ABNORMAL HIGH (ref 0–450)

## 2013-04-08 MED ORDER — FUROSEMIDE 10 MG/ML IJ SOLN
40.0000 mg | Freq: Two times a day (BID) | INTRAMUSCULAR | Status: DC
  Administered 2013-04-08 – 2013-04-10 (×4): 40 mg via INTRAVENOUS
  Filled 2013-04-08 (×6): qty 4

## 2013-04-08 MED ORDER — LEVOTHYROXINE SODIUM 25 MCG PO TABS
25.0000 ug | ORAL_TABLET | Freq: Every day | ORAL | Status: DC
  Administered 2013-04-08 – 2013-04-16 (×9): 25 ug via ORAL
  Filled 2013-04-08 (×10): qty 1

## 2013-04-08 MED ORDER — WARFARIN - PHARMACIST DOSING INPATIENT: Freq: Every day | Status: DC

## 2013-04-08 MED ORDER — ACETAMINOPHEN 325 MG PO TABS: 650.0000 mg | ORAL_TABLET | Freq: Four times a day (QID) | ORAL | Status: DC | PRN

## 2013-04-08 MED ORDER — ACETAMINOPHEN 650 MG RE SUPP: 650.0000 mg | Freq: Four times a day (QID) | RECTAL | Status: DC | PRN

## 2013-04-08 MED ORDER — ONDANSETRON HCL 4 MG/2ML IJ SOLN
4.0000 mg | Freq: Four times a day (QID) | INTRAMUSCULAR | Status: DC | PRN
  Administered 2013-04-08: 4 mg via INTRAVENOUS
  Filled 2013-04-08: qty 2

## 2013-04-08 MED ORDER — DEXTROSE 5 % IV SOLN
5.0000 mg/h | Freq: Once | INTRAVENOUS | Status: AC
  Administered 2013-04-08: 5 mg/h via INTRAVENOUS

## 2013-04-08 MED ORDER — DILTIAZEM HCL 100 MG IV SOLR
5.0000 mg/h | INTRAVENOUS | Status: DC
  Administered 2013-04-08 (×2): 15 mg/h via INTRAVENOUS

## 2013-04-08 MED ORDER — WARFARIN SODIUM 5 MG PO TABS
5.0000 mg | ORAL_TABLET | ORAL | Status: DC
  Administered 2013-04-09: 5 mg via ORAL
  Filled 2013-04-08 (×2): qty 1

## 2013-04-08 MED ORDER — ASPIRIN 81 MG PO TABS: 81.0000 mg | ORAL_TABLET | Freq: Every morning | ORAL | Status: DC

## 2013-04-08 MED ORDER — ALBUTEROL SULFATE (5 MG/ML) 0.5% IN NEBU
5.0000 mg | INHALATION_SOLUTION | Freq: Once | RESPIRATORY_TRACT | Status: AC
  Administered 2013-04-08: 5 mg via RESPIRATORY_TRACT
  Filled 2013-04-08: qty 1

## 2013-04-08 MED ORDER — HYDROCODONE-ACETAMINOPHEN 5-325 MG PO TABS
1.0000 | ORAL_TABLET | ORAL | Status: DC | PRN
  Administered 2013-04-08 – 2013-04-11 (×5): 2 via ORAL
  Administered 2013-04-12: 1 via ORAL
  Administered 2013-04-13 – 2013-04-15 (×4): 2 via ORAL
  Filled 2013-04-08 (×2): qty 2
  Filled 2013-04-08 (×2): qty 1
  Filled 2013-04-08 (×4): qty 2
  Filled 2013-04-08 (×2): qty 1
  Filled 2013-04-08 (×2): qty 2

## 2013-04-08 MED ORDER — FLUTICASONE PROPIONATE 50 MCG/ACT NA SUSP
2.0000 | Freq: Every day | NASAL | Status: DC
  Administered 2013-04-08 – 2013-04-16 (×8): 2 via NASAL
  Filled 2013-04-08: qty 16

## 2013-04-08 MED ORDER — POTASSIUM CHLORIDE CRYS ER 20 MEQ PO TBCR
20.0000 meq | EXTENDED_RELEASE_TABLET | Freq: Every day | ORAL | Status: DC
  Administered 2013-04-08 – 2013-04-12 (×5): 20 meq via ORAL
  Filled 2013-04-08 (×5): qty 1

## 2013-04-08 MED ORDER — SODIUM CHLORIDE 0.9 % IV SOLN: 250.0000 mL | INTRAVENOUS | Status: DC | PRN

## 2013-04-08 MED ORDER — ENSURE COMPLETE PO LIQD
237.0000 mL | Freq: Two times a day (BID) | ORAL | Status: DC
  Administered 2013-04-09 – 2013-04-13 (×2): 237 mL via ORAL

## 2013-04-08 MED ORDER — FUROSEMIDE 10 MG/ML IJ SOLN
40.0000 mg | Freq: Once | INTRAMUSCULAR | Status: AC
  Administered 2013-04-08: 40 mg via INTRAVENOUS
  Filled 2013-04-08: qty 4

## 2013-04-08 MED ORDER — ASPIRIN EC 81 MG PO TBEC
81.0000 mg | DELAYED_RELEASE_TABLET | Freq: Every day | ORAL | Status: DC
  Administered 2013-04-08 – 2013-04-10 (×3): 81 mg via ORAL
  Filled 2013-04-08 (×4): qty 1

## 2013-04-08 MED ORDER — TAMSULOSIN HCL 0.4 MG PO CAPS
0.4000 mg | ORAL_CAPSULE | Freq: Every day | ORAL | Status: DC
  Administered 2013-04-08 – 2013-04-16 (×9): 0.4 mg via ORAL
  Filled 2013-04-08 (×9): qty 1

## 2013-04-08 MED ORDER — ALBUTEROL SULFATE HFA 108 (90 BASE) MCG/ACT IN AERS: 2.0000 | INHALATION_SPRAY | RESPIRATORY_TRACT | Status: DC | PRN

## 2013-04-08 MED ORDER — AMITRIPTYLINE HCL 25 MG PO TABS
25.0000 mg | ORAL_TABLET | Freq: Every day | ORAL | Status: DC
  Administered 2013-04-08 – 2013-04-15 (×8): 25 mg via ORAL
  Filled 2013-04-08 (×10): qty 1

## 2013-04-08 MED ORDER — NITROGLYCERIN 0.4 MG SL SUBL: 0.4000 mg | SUBLINGUAL_TABLET | SUBLINGUAL | Status: DC | PRN

## 2013-04-08 MED ORDER — DILTIAZEM HCL 25 MG/5ML IV SOLN
15.0000 mg | Freq: Once | INTRAVENOUS | Status: AC
  Administered 2013-04-08: 08:00:00 via INTRAVENOUS
  Filled 2013-04-08: qty 5

## 2013-04-08 MED ORDER — SODIUM CHLORIDE 0.9 % IJ SOLN
3.0000 mL | Freq: Two times a day (BID) | INTRAMUSCULAR | Status: DC
  Administered 2013-04-08 (×2): 3 mL via INTRAVENOUS

## 2013-04-08 MED ORDER — WARFARIN SODIUM 2.5 MG PO TABS
2.5000 mg | ORAL_TABLET | ORAL | Status: DC
  Administered 2013-04-08: 2.5 mg via ORAL
  Filled 2013-04-08: qty 1

## 2013-04-08 MED ORDER — TIOTROPIUM BROMIDE MONOHYDRATE 18 MCG IN CAPS
18.0000 ug | ORAL_CAPSULE | Freq: Every day | RESPIRATORY_TRACT | Status: DC
  Administered 2013-04-08 – 2013-04-16 (×9): 18 ug via RESPIRATORY_TRACT
  Filled 2013-04-08 (×3): qty 5

## 2013-04-08 MED ORDER — SODIUM CHLORIDE 0.9 % IJ SOLN: 3.0000 mL | INTRAMUSCULAR | Status: DC | PRN

## 2013-04-08 MED ORDER — DOCUSATE SODIUM 100 MG PO CAPS
100.0000 mg | ORAL_CAPSULE | Freq: Two times a day (BID) | ORAL | Status: DC
  Administered 2013-04-08 – 2013-04-10 (×5): 100 mg via ORAL
  Filled 2013-04-08 (×6): qty 1

## 2013-04-08 MED ORDER — CARVEDILOL 3.125 MG PO TABS
3.1250 mg | ORAL_TABLET | Freq: Two times a day (BID) | ORAL | Status: DC
  Administered 2013-04-08 – 2013-04-16 (×15): 3.125 mg via ORAL
  Filled 2013-04-08 (×19): qty 1

## 2013-04-08 MED ORDER — IPRATROPIUM BROMIDE 0.02 % IN SOLN
0.5000 mg | Freq: Once | RESPIRATORY_TRACT | Status: AC
  Administered 2013-04-08: 0.5 mg via RESPIRATORY_TRACT
  Filled 2013-04-08: qty 2.5

## 2013-04-08 NOTE — ED Notes (Signed)
Family at bedside. Pt aware of delay. No pain

## 2013-04-08 NOTE — ED Notes (Signed)
Increased cardizem gtt to 10 mg/hr due to continued increased HR

## 2013-04-08 NOTE — H&P (Signed)
[facility] History and Physical  [name] ZOX:[id number] DOB: [birth year] DOA: 04/08/2013  Referring physician: ED physician. PCP: Tillman Abide, MD  Specialists: Dr Donnie Aho.   Chief Complaint: SOB.  HPI: [name] is a 79 y.o. male PMH significant for A fib, diastolic HF, CAD, pleural effusion, who presents to ED complaining of SOB that started 3 weeks prior to admission. He relates dyspnea worse today. He denies chest pain, palpitation. He was found to be in A fib with RVR Hr in the 140. He was started on Cardizem Gtt. He received lasix. He is breathing better after treatments. He denies weight gain, no lower extremities swelling.    Review of Systems: Negative except as per HPI.   Past Medical History  Diagnosis Date  . Allergy   . CAD (coronary artery disease)   . Hyperlipidemia   . Kidney stones   . BPH (benign prostatic hypertrophy)   . Osteoarthritis   . COPD (chronic obstructive pulmonary disease)   . Hypothyroidism   . Atrial fibrillation   . Impaired fasting glucose    Past Surgical History  Procedure Laterality Date  . Inguinal hernia repair  09/2004    left  . Back surgery  1990's    x3  . Transurethral resection of prostate  2002  . Pleural scarification  1980    right  . Esophagogastroduodenoscopy  04/2002    negative  . Bladder stone removal  06/2006   Social History:  reports that he quit smoking about 11 years ago. His smoking use included Cigarettes. He smoked 0.00 packs per day. He has never used smokeless tobacco. He reports that he does not drink alcohol or use illicit drugs.   Allergies  Allergen Reactions  . Atorvastatin Other (See Comments)    REACTION: severe muscle aches  . Ezetimibe-Simvastatin Other (See Comments)    REACTION: severe muscle aches  . Fenofibrate     REACTION: "ran me up a wall"  . Penicillins Other (See Comments)    blisters    Family History  Problem Relation Age of Onset  . Heart disease Mother   .  Heart disease Brother   . Stroke Brother   . Cancer Neg Hx   . Diabetes Neg Hx   . Hypertension Neg Hx   . Heart disease Brother   . Stroke Brother   . Heart disease Brother   . Stroke Brother     Prior to Admission medications   Medication Sig Start Date End Date Taking? Authorizing Provider  albuterol (PROVENTIL HFA) 108 (90 BASE) MCG/ACT inhaler Inhale 2 puffs into the lungs every 4 (four) hours as needed for wheezing. 01/27/13  Yes Spencer Copland, MD  amitriptyline (ELAVIL) 25 MG tablet Take 25 mg by mouth at bedtime.   Yes Historical Provider, MD  aspirin 81 MG tablet Take 81 mg by mouth every morning.    Yes Historical Provider, MD  carvedilol (COREG) 3.125 MG tablet Take 1 tablet (3.125 mg total) by mouth 2 (two) times daily with a meal. 02/25/13  Yes Elease Etienne, MD  diltiazem (CARDIZEM CD) 180 MG 24 hr capsule Take 180 mg by mouth every morning.  02/01/11  Yes Historical Provider, MD  fluticasone (FLONASE) 50 MCG/ACT nasal spray Place 2 sprays into the nose daily. 2 spray, Each Nare, Daily 02/25/13  Yes Elease Etienne, MD  furosemide (LASIX) 40 MG tablet Take 1 tablet (40 mg total) by mouth 2 (two) times daily. 02/25/13  Yes Maximino Greenland  Hongalgi, MD  HYDROcodone-acetaminophen (NORCO/VICODIN) 5-325 MG per tablet TAKE 1 TABLET BY MOUTH 3 TIMES A DAY AS NEEDED 03/08/13  Yes Karie Schwalbe, MD  levothyroxine (SYNTHROID, LEVOTHROID) 25 MCG tablet Take 25 mcg by mouth every morning.   Yes Historical Provider, MD  nitroGLYCERIN (NITROSTAT) 0.4 MG SL tablet Place 0.4 mg under the tongue every 5 (five) minutes as needed for chest pain. x3 doses for for chest pain 10/19/12  Yes Karie Schwalbe, MD  potassium chloride SA (K-DUR,KLOR-CON) 20 MEQ tablet Take 1 tablet (20 mEq total) by mouth daily. 02/25/13  Yes Elease Etienne, MD  RAPAFLO 8 MG CAPS Take 8 mg by mouth daily.  01/11/11  Yes Historical Provider, MD  tiotropium (SPIRIVA) 18 MCG inhalation capsule Place 18 mcg into inhaler and inhale  daily.   Yes Historical Provider, MD  warfarin (COUMADIN) 2.5 MG tablet Take 2.5 mg by mouth 2 (two) times a week. Takes on Tues and Thurs in the morning 08/10/12  Yes Karie Schwalbe, MD  warfarin (COUMADIN) 5 MG tablet Take 5 mg by mouth as directed. Take on Mon, Wed, Fri, Sat & Sun. Takes in the morning.   Yes Historical Provider, MD   Physical Exam: Filed Vitals:   04/08/13 0845 04/08/13 0930 04/08/13 0945 04/08/13 1000  BP: 127/80 122/80 124/67 116/70  Pulse: 37 92 68 43  Temp:      TempSrc:      Resp: 23 12 22 21   SpO2: 100% 98% 100% 97%     General Appearance:    Alert, cooperative, no distress, appears stated age  Head:    Normocephalic, without obvious abnormality, atraumatic  Eyes:    PERRL, conjunctiva/corneas clear, EOM's intact,        Ears:    Normal TM's and external ear canals, both ears  Nose:   Nares normal, septum midline, mucosa normal, no drainage    or sinus tenderness  Throat:   Lips, mucosa, and tongue normal; teeth and gums normal  Neck:   Supple, symmetrical, trachea midline, no adenopathy;       thyroid:  No enlargement/tenderness/nodules; no carotid   bruit or JVD  Back:     Symmetric, no curvature, ROM normal, no CVA tenderness  Lungs:     Bilateral Crackles, respirations unlabored     Heart:    Regular rate and rhythm, S1 and S2 normal, systolic  murmur, rub   or gallop  Abdomen:     Soft, non-tender, bowel sounds active all four quadrants,    no masses, no organomegaly        Extremities:   Extremities normal, atraumatic, no cyanosis or edema  Pulses:   2+ and symmetric all extremities  Skin:   Skin color, texture, turgor normal, no rashes or lesions  Lymph nodes:   Cervical, supraclavicular, and axillary nodes normal  Neurologic:   CNII-XII intact. Normal strength, sensation and reflexes      throughout   Labs on Admission:  Basic Metabolic Panel:  Recent Labs Lab 04/08/13 0709  NA 137  K 4.8  CL 104  CO2 26  GLUCOSE 102*  BUN 18   CREATININE 1.07  CALCIUM 9.6   Liver Function Tests: No results found for this basename: AST, ALT, ALKPHOS, BILITOT, PROT, ALBUMIN,  in the last 168 hours No results found for this basename: LIPASE, AMYLASE,  in the last 168 hours No results found for this basename: AMMONIA,  in the last 168 hours CBC:  Recent Labs Lab 04/08/13 0709  WBC 6.9  HGB 12.7*  HCT 37.4*  MCV 86.2  PLT 228   Cardiac Enzymes:  Recent Labs Lab 04/08/13 0709  TROPONINI <0.30    BNP (last 3 results)  Recent Labs  02/21/13 1738 02/22/13 1610 04/08/13 0818  PROBNP 890.8* 1108.0* 1755.0*   CBG: No results found for this basename: GLUCAP,  in the last 168 hours  Radiological Exams on Admission: Dg Chest 2 View  04/08/2013  *RADIOLOGY REPORT*  Clinical Data: Extreme shortness of breath.  History of congestive heart failure.  CHEST - 2 VIEW  Comparison: 02/23/2013 radiographs.  CT 02/23/2013.  Findings: The heart size and mediastinal contours are stable.  Left greater than right pleural effusions have mildly enlarged.  Again demonstrated are diffuse emphysematous changes with associated scarring and architectural distortion.  Left apical density is unchanged.  Compared with the prior study, the interstitial prominence is slightly greater suggesting possible superimposed edema.  There is no focal airspace disease or pneumothorax.  IMPRESSION: Enlarging bilateral pleural effusions with increased interstitial prominence suggesting possible pulmonary edema and congestive heart failure.  Underlying severe emphysema.   Original Report Authenticated By: Carey Bullocks, M.D.     EKG: Independently reviewed. A fib RVR.   Assessment/Plan Active Problems:   Atrial fibrillation   COPD   Hypothyroidism   Acute respiratory failure   1-A fib RVR: Continue with Cardizem Gtt. Cycle troponin, ECHO. Cardiology consulted. Admit to step down. Continue with coumadin per pharmacy. 2-Acute on chronic systolic,  diastolic Heart failure. Increase BNP, chest x ray with bilateral pleural effusion. Patient was not sure if he was taking lasix. IV lasix 40 mg IV BID.  3-Hypothyroidims: Continue with synthroid.  4-COPD/Emphisema: no wheezes on lung exam. Continue with spiriva and albuterol.  5-Acute Respiratory failure, hypoxic. Multifactorial, A fib RVR, Heart failure, pleural effusion.   Code Status; Full Code. Patient wants to be full code.  Family Communication: spoke with daughter and wife who were at bedside.  Disposition Plan: Expect 3 to 4 days.   Time spent: 75 minutes.  Lakela Kuba [facility] Pager [phone]  If 7PM-7AM, please contact night-coverage [email or web address] Password [facility] 04/08/2013, 11:44 AM

## 2013-04-08 NOTE — Progress Notes (Signed)
INITIAL NUTRITION ASSESSMENT  DOCUMENTATION CODES Per approved criteria  -Not Applicable   INTERVENTION: 1. Ensure Complete po BID, each supplement provides 350 kcal and 13 grams of protein.   NUTRITION DIAGNOSIS: Inadequate oral intake related to recent dietary changes as evidenced by weight loss.   Goal: PO intake to meet >/=90% estimated nutrition needs  Monitor:  PO intake, weight trends, labs, I/O's  Reason for Assessment: Malnutrition Screening Tool  79 y.o. male  Admitting Dx: A fib, pleural effusions   ASSESSMENT: Pt presented with SOB for 2 weeks, found to be in A fib with bilateral PE.  Pt states he has been losing weight since last hospital admission about a month ago. Appetite has been good, but recently reduced sodium and fat intake. Prior to this he was eating fried foods regularly. Weight hx has been variable, likely with fluid status given hx of CHF. May have had some true weight loss with dietary changes. Currently on IV lasix, expect weight to trend down this admission. Pt is willing to try an Ensure.   Height: Ht Readings from Last 1 Encounters:  04/08/13 5\' 7"  (1.702 m)    Weight: Wt Readings from Last 1 Encounters:  04/08/13 123 lb 0.3 oz (55.8 kg)    Ideal Body Weight: 148 lbs   % Ideal Body Weight: 83%  Wt Readings from Last 10 Encounters:  04/08/13 123 lb 0.3 oz (55.8 kg)  03/11/13 128 lb (58.06 kg)  02/25/13 122 lb 8 oz (55.566 kg)  02/02/13 135 lb (61.236 kg)  01/27/13 129 lb 12 oz (58.854 kg)  07/22/12 122 lb 8 oz (55.566 kg)  07/21/12 120 lb 8 oz (54.658 kg)  03/10/12 130 lb (58.968 kg)  01/21/12 128 lb (58.06 kg)  10/28/11 127 lb (57.607 kg)    Usual Body Weight: 132 lbs per pt report  % Usual Body Weight: 93%  BMI:  Body mass index is 19.26 kg/(m^2). WNL  Estimated Nutritional Needs: Kcal: 1450-1650 Protein: 65-75 gm  Fluid: >/=1.2 L  Skin: intact   Diet Order: Cardiac  EDUCATION NEEDS: -No education needs  identified at this time   Intake/Output Summary (Last 24 hours) at 04/08/13 1516 Last data filed at 04/08/13 1500  Gross per 24 hour  Intake 308.67 ml  Output    800 ml  Net -491.33 ml    Last BM: PTA   Labs:   Recent Labs Lab 04/08/13 0709  NA 137  K 4.8  CL 104  CO2 26  BUN 18  CREATININE 1.07  CALCIUM 9.6  GLUCOSE 102*    CBG (last 3)  No results found for this basename: GLUCAP,  in the last 72 hours  Scheduled Meds: . amitriptyline  25 mg Oral QHS  . aspirin EC  81 mg Oral Daily  . carvedilol  3.125 mg Oral BID WC  . docusate sodium  100 mg Oral BID  . fluticasone  2 spray Each Nare Daily  . furosemide  40 mg Intravenous Q12H  . levothyroxine  25 mcg Oral QAC breakfast  . potassium chloride SA  20 mEq Oral Daily  . sodium chloride  3 mL Intravenous Q12H  . tamsulosin  0.4 mg Oral Daily  . tiotropium  18 mcg Inhalation Daily  . warfarin  2.5 mg Oral Custom  . [START ON 04/09/2013] warfarin  5 mg Oral Custom  . Warfarin - Pharmacist Dosing Inpatient   Does not apply q1800    Continuous Infusions: . diltiazem (CARDIZEM) infusion  15 mg/hr (04/08/13 1220)    Past Medical History  Diagnosis Date  . Allergy   . CAD (coronary artery disease)   . Hyperlipidemia   . Kidney stones   . BPH (benign prostatic hypertrophy)   . Osteoarthritis   . COPD (chronic obstructive pulmonary disease)   . Hypothyroidism   . Atrial fibrillation   . Impaired fasting glucose   . Myocardial infarction   . Anginal pain   . Hypertension   . Shortness of breath   . CHF (congestive heart failure)   . GERD (gastroesophageal reflux disease)     Past Surgical History  Procedure Laterality Date  . Inguinal hernia repair  09/2004    left  . Back surgery  1990's    x3  . Transurethral resection of prostate  2002  . Pleural scarification  1980    right  . Esophagogastroduodenoscopy  04/2002    negative  . Bladder stone removal  06/2006    Clarene Duke RD, LDN Pager  [phone] After Hours pager [phone]

## 2013-04-08 NOTE — Progress Notes (Signed)
ANTICOAGULATION CONSULT NOTE - Initial Consult  Pharmacy Consult for Coumadin Indication: atrial fibrillation  Allergies  Allergen Reactions  . Atorvastatin Other (See Comments)    REACTION: severe muscle aches  . Ezetimibe-Simvastatin Other (See Comments)    REACTION: severe muscle aches  . Fenofibrate     REACTION: "ran me up a wall"  . Penicillins Other (See Comments)    blisters    Patient Measurements: Height: 5\' 7"  (170.2 cm) Weight: 123 lb 0.3 oz (55.8 kg) IBW/kg (Calculated) : 66.1 Heparin Dosing Weight:   Vital Signs: Temp: 98 F (36.7 C) (04/17 1200) Temp src: Oral (04/17 1200) BP: 139/78 mmHg (04/17 1215) Pulse Rate: 115 (04/17 1215)  Labs:  Recent Labs  04/08/13 0709  HGB 12.7*  HCT 37.4*  PLT 228  LABPROT 28.1*  INR 2.80*  CREATININE 1.07  TROPONINI <0.30    Estimated Creatinine Clearance: 42 ml/min (by C-G formula based on Cr of 1.07).   Medical History: Past Medical History  Diagnosis Date  . Allergy   . CAD (coronary artery disease)   . Hyperlipidemia   . Kidney stones   . BPH (benign prostatic hypertrophy)   . Osteoarthritis   . COPD (chronic obstructive pulmonary disease)   . Hypothyroidism   . Atrial fibrillation   . Impaired fasting glucose     Medications:  Prescriptions prior to admission  Medication Sig Dispense Refill  . albuterol (PROVENTIL HFA) 108 (90 BASE) MCG/ACT inhaler Inhale 2 puffs into the lungs every 4 (four) hours as needed for wheezing.  6 each  2  . amitriptyline (ELAVIL) 25 MG tablet Take 25 mg by mouth at bedtime.      Marland Kitchen aspirin 81 MG tablet Take 81 mg by mouth every morning.       . carvedilol (COREG) 3.125 MG tablet Take 1 tablet (3.125 mg total) by mouth 2 (two) times daily with a meal.  60 tablet  0  . diltiazem (CARDIZEM CD) 180 MG 24 hr capsule Take 180 mg by mouth every morning.       . fluticasone (FLONASE) 50 MCG/ACT nasal spray Place 2 sprays into the nose daily. 2 spray, Each Nare, Daily  16 g  0   . furosemide (LASIX) 40 MG tablet Take 1 tablet (40 mg total) by mouth 2 (two) times daily.  60 tablet  0  . HYDROcodone-acetaminophen (NORCO/VICODIN) 5-325 MG per tablet TAKE 1 TABLET BY MOUTH 3 TIMES A DAY AS NEEDED  90 tablet  0  . levothyroxine (SYNTHROID, LEVOTHROID) 25 MCG tablet Take 25 mcg by mouth every morning.      . nitroGLYCERIN (NITROSTAT) 0.4 MG SL tablet Place 0.4 mg under the tongue every 5 (five) minutes as needed for chest pain. x3 doses for for chest pain      . potassium chloride SA (K-DUR,KLOR-CON) 20 MEQ tablet Take 1 tablet (20 mEq total) by mouth daily.  30 tablet  0  . RAPAFLO 8 MG CAPS Take 8 mg by mouth daily.       Marland Kitchen tiotropium (SPIRIVA) 18 MCG inhalation capsule Place 18 mcg into inhaler and inhale daily.      Marland Kitchen warfarin (COUMADIN) 2.5 MG tablet Take 2.5 mg by mouth 2 (two) times a week. Takes on Tues and Thurs in the morning      . warfarin (COUMADIN) 5 MG tablet Take 5 mg by mouth as directed. Take on Mon, Wed, Fri, Sat & Sun. Takes in the morning.  Assessment: 84yom on chronic Coumadin for Afib. Admit INR (2.8) is therapeutic - will continue home Coumadin regimen (5mg  daily except 2.5mg  on Tuesdays and Thursdays). - Hg 12.7, Plts wnl - No significant bleeding reported by patient  Goal of Therapy:  INR 2-3   Plan:  1. Continue home Coumadin regimen - 2.5mg  due tonight 2. Daily INR  Cleon Dew [phone] 04/08/2013,1:21 PM

## 2013-04-08 NOTE — Progress Notes (Signed)
[facility]  Patient Status: Active (receiving services up to time of hospitalization)  [facility] is providing the following services: RN  If patient discharges after hours, please call [phone].   Kizzie Furnish 04/08/2013, 3:47 PM

## 2013-04-08 NOTE — Progress Notes (Signed)
Notified both [facility] and Dr. Tresa Endo  About pts Hr 57-70 SBP 79-83.  Cardizem gtt off and monitoring pt.  Pt resting.  No new orders at this time. Will continue to monitor. Emilie [place] Liter

## 2013-04-08 NOTE — Progress Notes (Signed)
Patient ID: [name], male   DOB: 02/24/1928, 79 y.o.   MRN: [id number] [name]  Date of visit:  03/12/2013 DOB:  02/14/1928    Age:  79 yrs. Medical record number:  [id number]     Account number:  [id number] Primary Care Provider: LETVAK, RICHARD ____________________________ CURRENT DIAGNOSES  1. Pleural effusion  2. Arrhythmia-Atrial Fibrillation  3. CAD,Native  4. Hyperlipidemia  5. Stent Placement  6. Long Term Use Anticoagulant  7. Hypothyroidism  8. COPD ____________________________ ALLERGIES  Amoxicillin Trihydrate  Crestor, Myalgias  Fenofibrate,Micronized  Lipitor, Myalgias  pravastatin, Bad dreams, dizziness  Rofecoxib  Simvastatin, Weakness ____________________________ MEDICATIONS  1. aspirin 81 mg tablet, effervescent, 1 p.o. q.Holland.  2. nitroglycerin 0.4 mg tablet, sublingual, PRN  3. hydrocodone-acetaminophen 10-325 mg tablet, PRN  4. Amitriptyline 25 mg Tablet, QHS  5. albuterol sulfate 90 mcg/Actuation HFA Aerosol Inhaler, PRN  6. Spiriva with HandiHaler 18 mcg Capsule, w/Inhalation Device, qd  7. warfarin 5 mg Tablet, Take as directed  8. levothyroxine 25 mcg Tablet, 1 p.o. daily  9. Rapaflo 8 mg Capsule, 1 p.o. daily  10. diltiazem HCl 180 mg capsule,extended release 24hr, 1 p.o. daily  11. carvedilol 3.125 mg tablet, BID  12. fluticasone 50 mcg/actuation disk with device, 2 spray each nostril qd  13. furosemide 40 mg tablet, BID  14. Klor-Con M20 20 mEq tablet,ER particles/crystals, 1 p.o. daily  15. pravastatin 20 mg tablet, 1 p.o. daily ____________________________ CHIEF COMPLAINTS  Followup of Arrhythmia-Atrial Fibrillation  Followup of CAD,Native ____________________________ HISTORY OF PRESENT ILLNESS  Patient seen for cardiac followup. He was recently admitted to the hospital with a fairly large left pleural effusion. He underwent a tap for this and there were no malignant cells present in it. He is somewhat weak his breathing is a lot better  now that he had the effusion tapped. He did have lower limits of normal systolic function noted on his echocardiogram. He continues to be significantly weak and fatigued but is slowly recovering following hospitalization. He had some mild elevation of his kidney tests. His not currently having edema and has had no bleeding complications from warfarin. ____________________________ PAST HISTORY  Past Medical Illnesses:  hyperlipidemia, hyperlipidemia, prostatitis, BPH, COPD, pleural effusion, hypothyroidism;  Cardiovascular Illnesses:  CAD, S/P MI-subendocardial March 2003, atrial fibrillation;  Surgical Procedures:  lumbar laminectomy x 3, TURP, pneumothorax, inguinal herniorrhaphy-left;  NYHA Classification:  II;  Cardiology Procedures-Invasive:  stent CFX March 2003, cardiac cath (left) May 2009;  Cardiology Procedures-Noninvasive:  adenosine cardiolite April 2008;  Cardiac Cath Results:  normal Left main, diffuse luminal irregularities LAD, 70% stenosis mid Ramus, 70% stenosis proximal RCA, widely patent stent in circumflex;  LVEF of 50% documented via echocardiogram on 02/22/2013 ,  ____________________________ CARDIO-PULMONARY TEST DATES EKG Date:  02/05/2011;   Cardiac Cath Date:  05/05/2008;  Stent Placement Date: 03/16/2002;  Nuclear Study Date:  04/16/2007;  Echocardiography Date: 02/23/2013;  Chest Xray Date: 02/25/2013;  CT Scan Date:  11/16/2008   ____________________________ SOCIAL HISTORY Alcohol Use:  no alcohol use;  Smoking:  used to smoke but quit 2012;  Diet:  regular diet without modifications;  Lifestyle:  married;  Exercise:  exercises daily and walking;  Occupation:  retired and [employer];  Residence:  lives with wife;   ____________________________ REVIEW OF SYSTEMS General:  malaise and fatigue Eyes: wears eye glasses/contact lenses, denies diplopia, history of glaucoma or visual field defects. Respiratory: see HPI Cardiovascular:  please review HPI Abdominal: intermittent  constipation Genitourinary-Male: hesitancy    Musculoskeletal:  arthritis of the neck and shoulder, arthritis of the knees, arthritis of the hips, chronic low back pain, recent epidural steroid injections Neurological:  denies headaches, stroke, or TIA  ____________________________ PHYSICAL EXAMINATION VITAL SIGNS  Blood Pressure:  88/60 Sitting, Right arm, regular cuff  , 90/62 Standing, Right arm and regular cuff   Pulse:  88/min. Weight:  125.00 lbs. Height:  67"BMI: 19  Constitutional:  pleasant, alert, white, male, in no acute distress Skin:  scattered telangectasia Head:  normocephalic, balding male hair pattern ENT:  ears, nose and throat reveal no gross abnormalities.  Dentition good. Neck:  supple, without massess. No JVD, thyromegaly or carotid bruits. Carotid upstroke normal. Chest:  increased A-P diameter, clear to auscultation and percussion Cardiac:  irregular rhythm, normal S1 and S2, no S3 or S4, no murmurs,clicks, or rub heard Peripheral Pulses:  left femoral bruit present, dorsalis pedis pulses 2+, posterior tibial pulses 2+ Extremities & Back:  no deformities, clubbing, cyanosis, erythema or edema observed. Normal muscle strength and tone. Neurological:  no gross motor or sensory deficits noted, affect appropriate, oriented x3. ____________________________ MOST RECENT LIPID PANEL 05/05/12  CHOL TOTL 222 mg/dl, LDL 143 calc, HDL 50 mg/dl, TRIGLYCER 148 mg/dl and CHOL/HDL 4.4 (Calc) ____________________________ IMPRESSIONS/PLAN  1. Recent left pleural effusion that in my opinion was likely postinflammatory and possibly related to infection 2. Atrial fibrillation on warfarin 3. Hypertensive heart disease With coronary artery disease with previous stenting  Recommendations:  He is still fairly weak from when he was in the hospital. I will plan to see him back in 6 months. I am not convinced that the pleural effusion was from congestive heart  failure. ____________________________ TODAYS ORDERS  1. Return Visit: 6 months                       ____________________________ Cardiology Physician:  W. Spencer Jacquise Rarick, Jr. MD FACC 

## 2013-04-08 NOTE — ED Notes (Addendum)
EMS called to home. Pt. C/O SOB and productive cough x 2 weeks. He was seen here recently for same thing with DX. Of COPD and Emphysema and CHF. AXO. Breathing not labored. Color good skin warm and dry.

## 2013-04-08 NOTE — Progress Notes (Signed)
Patient ID: [name], male   DOB: 1928-02-04, 79 y.o.   MRN: [id number] [name]  Date of visit:  03/12/2013 DOB:  [birth year]    Age:  79 yrs. Medical record number:  [id number]     Account number:  [id number] Primary Care Provider: Tillman Abide ____________________________ CURRENT DIAGNOSES  1. Pleural effusion  2. Arrhythmia-Atrial Fibrillation  3. CAD,Native  4. Hyperlipidemia  5. Stent Placement  6. Long Term Use Anticoagulant  7. Hypothyroidism  8. COPD ____________________________ ALLERGIES  Amoxicillin Trihydrate  Crestor, Myalgias  Fenofibrate,Micronized  Lipitor, Myalgias  pravastatin, Bad dreams, dizziness  Rofecoxib  Simvastatin, Weakness ____________________________ MEDICATIONS  1. aspirin 81 mg tablet, effervescent, 1 p.o. q.Holland.  2. nitroglycerin 0.4 mg tablet, sublingual, PRN  3. hydrocodone-acetaminophen 10-325 mg tablet, PRN  4. Amitriptyline 25 mg Tablet, QHS  5. albuterol sulfate 90 mcg/Actuation HFA Aerosol Inhaler, PRN  6. Spiriva with HandiHaler 18 mcg Capsule, w/Inhalation Device, qd  7. warfarin 5 mg Tablet, Take as directed  8. levothyroxine 25 mcg Tablet, 1 p.o. daily  9. Rapaflo 8 mg Capsule, 1 p.o. daily  10. diltiazem HCl 180 mg capsule,extended release 24hr, 1 p.o. daily  11. carvedilol 3.125 mg tablet, BID  12. fluticasone 50 mcg/actuation disk with device, 2 spray each nostril qd  13. furosemide 40 mg tablet, BID  14. Klor-Con M20 20 mEq tablet,ER particles/crystals, 1 p.o. daily  15. pravastatin 20 mg tablet, 1 p.o. daily ____________________________ CHIEF COMPLAINTS  Followup of Arrhythmia-Atrial Fibrillation  Followup of CAD,Native ____________________________ HISTORY OF PRESENT ILLNESS  Patient seen for cardiac followup. He was recently admitted to the hospital with a fairly large left pleural effusion. He underwent a tap for this and there were no malignant cells present in it. He is somewhat weak his breathing is a lot better  now that he had the effusion tapped. He did have lower limits of normal systolic function noted on his echocardiogram. He continues to be significantly weak and fatigued but is slowly recovering following hospitalization. He had some mild elevation of his kidney tests. His not currently having edema and has had no bleeding complications from warfarin. ____________________________ PAST HISTORY  Past Medical Illnesses:  hyperlipidemia, hyperlipidemia, prostatitis, BPH, COPD, pleural effusion, hypothyroidism;  Cardiovascular Illnesses:  CAD, S/P MI-subendocardial March 2003, atrial fibrillation;  Surgical Procedures:  lumbar laminectomy x 3, TURP, pneumothorax, inguinal herniorrhaphy-left;  NYHA Classification:  II;  Cardiology Procedures-Invasive:  stent CFX March 2003, cardiac cath (left) May 2009;  Cardiology Procedures-Noninvasive:  adenosine cardiolite April 2008;  Cardiac Cath Results:  normal Left main, diffuse luminal irregularities LAD, 70% stenosis mid Ramus, 70% stenosis proximal RCA, widely patent stent in circumflex;  LVEF of 50% documented via echocardiogram on 02/22/2013 ,  ____________________________ CARDIO-PULMONARY TEST DATES EKG Date:  02/05/2011;   Cardiac Cath Date:  05/05/2008;  Stent Placement Date: 03/16/2002;  Nuclear Study Date:  04/16/2007;  Echocardiography Date: 02/23/2013;  Chest Xray Date: 02/25/2013;  CT Scan Date:  11/16/2008   ____________________________ SOCIAL HISTORY Alcohol Use:  no alcohol use;  Smoking:  used to smoke but quit 2012;  Diet:  regular diet without modifications;  Lifestyle:  married;  Exercise:  exercises daily and walking;  Occupation:  retired and Therapist, music;  Residence:  lives with wife;   ____________________________ REVIEW OF SYSTEMS General:  malaise and fatigue Eyes: wears eye glasses/contact lenses, denies diplopia, history of glaucoma or visual field defects. Respiratory: see HPI Cardiovascular:  please review HPI Abdominal: intermittent  constipation Genitourinary-Male: hesitancy  Musculoskeletal:  arthritis of the neck and shoulder, arthritis of the knees, arthritis of the hips, chronic low back pain, recent epidural steroid injections Neurological:  denies headaches, stroke, or TIA  ____________________________ PHYSICAL EXAMINATION VITAL SIGNS  Blood Pressure:  88/60 Sitting, Right arm, regular cuff  , 90/62 Standing, Right arm and regular cuff   Pulse:  88/min. Weight:  125.00 lbs. Height:  67"BMI: 19  Constitutional:  pleasant, alert, white, male, in no acute distress Skin:  scattered telangectasia Head:  normocephalic, balding male hair pattern ENT:  ears, nose and throat reveal no gross abnormalities.  Dentition good. Neck:  supple, without massess. No JVD, thyromegaly or carotid bruits. Carotid upstroke normal. Chest:  increased A-P diameter, clear to auscultation and percussion Cardiac:  irregular rhythm, normal S1 and S2, no S3 or S4, no murmurs,clicks, or rub heard Peripheral Pulses:  left femoral bruit present, dorsalis pedis pulses 2+, posterior tibial pulses 2+ Extremities & Back:  no deformities, clubbing, cyanosis, erythema or edema observed. Normal muscle strength and tone. Neurological:  no gross motor or sensory deficits noted, affect appropriate, oriented x3. ____________________________ MOST RECENT LIPID PANEL 05/05/12  CHOL TOTL 222 mg/dl, LDL 147 calc, HDL 50 mg/dl, TRIGLYCER 829 mg/dl and CHOL/HDL 4.4 (Calc) ____________________________ IMPRESSIONS/PLAN  1. Recent left pleural effusion that in my opinion was likely postinflammatory and possibly related to infection 2. Atrial fibrillation on warfarin 3. Hypertensive heart disease With coronary artery disease with previous stenting  Recommendations:  He is still fairly weak from when he was in the hospital. I will plan to see him back in 6 months. I am not convinced that the pleural effusion was from congestive heart  failure. ____________________________ TODAYS ORDERS  1. Return Visit: 6 months                       ____________________________ Cardiology Physician:  Darden Palmer MD [facility]

## 2013-04-08 NOTE — ED Notes (Signed)
MD at bedside. Hospitalist at bedside. 

## 2013-04-08 NOTE — Consult Note (Signed)
Cardiology Consult Note  Admit date: 04/08/2013 Name: [name] 79 y.o.  male DOB:  June 17, 1928 MRN:  [id number]  Today's date:  04/08/2013  Referring Physician:    [facility]  Primary Physician:    Dr. Tillman Abide  Reason for Consultation:  Shortness of breath and atrial fibrillation  IMPRESSIONS: 1. Acute respiratory distress which is likely a combination of a COPD exacerbation, diastolic heart failure which may have been worsened with rapid atrial fibrillation 2. Chronic atrial fibrillation with acute worsening 3. Acute on chronic diastolic heart failure 4. Coronary artery disease with previous stenting of the circumflex 5. Long-term Coumadin therapy 6. Advanced COPD 7. Recent pleural effusion predominantly left-sided which was likely not due to heart failure 8. Stage III chronic kidney disease 9. Hypothyroidism  RECOMMENDATION: The patient currently has better control of his atrial fibrillation. Unclear whether he has been compliant with his furosemide at home. I would increase his furosemide dose and also diurese. His creatinine is much lower than previous. He however has no peripheral edema and his JVP was not particularly elevated. One would wonder whether or the effusion is due to some other process but at the present time I think we can diurese him until the creatinine starts to climb again. He just recently had an echocardiogram so I don't think this needs to be repeated. I will follow along with you. He also is mildly wheezing and will need to have his pulmonary status assessed and advanced.  HISTORY: This 79 year old male has a history of coronary artery disease. He has had a previous stent of the circumflex and has been noted to be patent at previous catheterization several years ago. He was admitted to the hospital with a sizable left pleural effusion in March. He had a thoracentesis of about 850 cc. He did have a mildly depressed ejection fraction of about 45%  at that time. He also was in atrial fibrillation and was under better control when he went home. He did have some chronic kidney disease noted. I saw him in the office about a month ago he was still quite weak from the hospitalization. He had improved since then but over the past 3-4 days developed worsening respiratory distress. He had cough productive of white sputum but did not have any peripheral edema. He was not using nonsteroidal anti-inflammatory agents and did not admit to much salt. He was admitted today with rapid atrial fibrillation and I was asked to see him again. His BNP is higher than it was at discharge. He was also found to have small bilateral effusions with significant hyperinflation. He isn't having any PND or orthopnea however. He does have significant urinary hesitancy.    Past Medical History  Diagnosis Date  . CAD (coronary artery disease)   . Hyperlipidemia   . Kidney stones   . BPH (benign prostatic hypertrophy)   . Osteoarthritis   . COPD (chronic obstructive pulmonary disease)   . Hypothyroidism   . Atrial fibrillation   . Impaired fasting glucose   . Myocardial infarction   . Hypertension   . Shortness of breath   . CHF (congestive heart failure)   . GERD (gastroesophageal reflux disease)       Past Surgical History  Procedure Laterality Date  . Inguinal hernia repair  09/2004    left  . Back surgery  1990's    x3  . Transurethral resection of prostate  2002  . Pleural scarification  1980    right  .  Esophagogastroduodenoscopy  04/2002    negative  . Bladder stone removal  06/2006     Allergies:  is allergic to atorvastatin; ezetimibe-simvastatin; fenofibrate; and penicillins.   Medications: Prior to Admission medications   Medication Sig Start Date End Date Taking? Authorizing Provider  albuterol (PROVENTIL HFA) 108 (90 BASE) MCG/ACT inhaler Inhale 2 puffs into the lungs every 4 (four) hours as needed for wheezing. 01/27/13  Yes Spencer Copland,  MD  amitriptyline (ELAVIL) 25 MG tablet Take 25 mg by mouth at bedtime.   Yes Historical Provider, MD  aspirin 81 MG tablet Take 81 mg by mouth every morning.    Yes Historical Provider, MD  carvedilol (COREG) 3.125 MG tablet Take 1 tablet (3.125 mg total) by mouth 2 (two) times daily with a meal. 02/25/13  Yes Elease Etienne, MD  diltiazem (CARDIZEM CD) 180 MG 24 hr capsule Take 180 mg by mouth every morning.  02/01/11  Yes Historical Provider, MD  fluticasone (FLONASE) 50 MCG/ACT nasal spray Place 2 sprays into the nose daily. 2 spray, Each Nare, Daily 02/25/13  Yes Elease Etienne, MD  furosemide (LASIX) 40 MG tablet Take 1 tablet (40 mg total) by mouth 2 (two) times daily. 02/25/13  Yes Elease Etienne, MD  HYDROcodone-acetaminophen (NORCO/VICODIN) 5-325 MG per tablet TAKE 1 TABLET BY MOUTH 3 TIMES A DAY AS NEEDED 03/08/13  Yes Karie Schwalbe, MD  levothyroxine (SYNTHROID, LEVOTHROID) 25 MCG tablet Take 25 mcg by mouth every morning.   Yes Historical Provider, MD  nitroGLYCERIN (NITROSTAT) 0.4 MG SL tablet Place 0.4 mg under the tongue every 5 (five) minutes as needed for chest pain. x3 doses for for chest pain 10/19/12  Yes Karie Schwalbe, MD  potassium chloride SA (K-DUR,KLOR-CON) 20 MEQ tablet Take 1 tablet (20 mEq total) by mouth daily. 02/25/13  Yes Elease Etienne, MD  RAPAFLO 8 MG CAPS Take 8 mg by mouth daily.  01/11/11  Yes Historical Provider, MD  tiotropium (SPIRIVA) 18 MCG inhalation capsule Place 18 mcg into inhaler and inhale daily.   Yes Historical Provider, MD  warfarin (COUMADIN) 2.5 MG tablet Take 2.5 mg by mouth 2 (two) times a week. Takes on Tues and Thurs in the morning 08/10/12  Yes Karie Schwalbe, MD  warfarin (COUMADIN) 5 MG tablet Take 5 mg by mouth as directed. Take on Mon, Wed, Fri, Sat & Sun. Takes in the morning.   Yes Historical Provider, MD    Family History: Family Status  Relation Status Death Age  . Mother Deceased 10  . Father Deceased 61    old age  .  Sister Deceased   . Brother Deceased   . Brother Deceased   . Brother Deceased   . Brother Alive   . Brother Alive   . Brother Alive   . Sister Alive   . Sister Alive     Social History:   reports that he quit smoking about 15 months ago. His smoking use included Cigarettes. He has a 60 pack-year smoking history. He has never used smokeless tobacco. He reports that he does not drink alcohol or use illicit drugs.   History   Social History Narrative   Does lots of yard work and a big garden   Has living will   No [employer] health care POA. Asks for daughter Ardeen Fillers to make decisions if needed.   Discussed DNR--- he doesn't want artificial ventilation but isn't clear about this.   Not sure about  tube feeds    Review of Systems: He has been very weak at home since discharge, he has mild nausea. He has significant urinary hesitancy. Severe arthritis of his back and has had previous epidural steroid injections. He has significant arthritis of his neck and shoulders as well as his knees and hips.  Physical Exam: BP 109/71  Pulse 96  Temp(Src) 98 F (36.7 C) (Oral)  Resp 26  Ht 5\' 7"  (1.702 m)  Wt 55.8 kg (123 lb 0.3 oz)  BMI 19.26 kg/m2  SpO2 98%  General appearance: alert, cooperative, appears stated age and Mildly dyspneic Head: Normocephalic, without obvious abnormality, atraumatic, Balding male hair pattern Eyes: conjunctivae/corneas clear. PERRL, EOM's intact. Fundi not examined Neck: no adenopathy, no carotid bruit, supple, symmetrical, trachea midline and JVD difficult to assess due to respiratory motion Lungs: Increased AP diameter, bilateral rales, dullness to percussion left base, bilateral soft expiratory wheezing Heart: Irregular rhythm, normal S1-S2 no S3, no murmur Abdomen: soft, non-tender; bowel sounds normal; no masses,  no organomegaly Rectal: deferred Extremities: extremities normal, atraumatic, no cyanosis or edema, left femoral bruit noted  Pulses: 2+ and  symmetric Skin: Skin color, texture, turgor normal. No rashes or lesions Neurologic: Grossly normal  Labs: CBC  Recent Labs  04/08/13 0709  WBC 6.9  RBC 4.34  HGB 12.7*  HCT 37.4*  PLT 228  MCV 86.2  MCH 29.3  MCHC 34.0  RDW 14.8   CMP   Recent Labs  04/08/13 0709  NA 137  K 4.8  CL 104  CO2 26  GLUCOSE 102*  BUN 18  CREATININE 1.07  CALCIUM 9.6  GFRNONAA 63*  GFRAA 73*   BNP (last 3 results)  Recent Labs  02/21/13 1738 02/22/13 1610 04/08/13 0818  PROBNP 890.8* 1108.0* 1755.0*   Cardiac Panel (last 3 results)  Recent Labs  04/08/13 0709 04/08/13 1245  TROPONINI <0.30 <0.30     Radiology: COPD with hyperinflation, bilateral pleural effusion left greater than right  EKG: Atrial fibrillation with rapid ventricular response, no ischemic ST changes  Signed:  W. Ashley Royalty MD [facility]   Cardiology Consultant  04/08/2013, 6:18 PM

## 2013-04-08 NOTE — ED Provider Notes (Addendum)
History    84yM with dyspnea. Has hx of COPD, CAD s/p stenting, paroxsymal atrial fibrillation. Reports worsening SOB for past three weeks.  Fairly constant. No appreciable exacerbating or relieving factors at home, but states he feels better since being placed on oxygen in ED. Denies any pain anywhere. No unusual leg pain or swelling. Occasional non-productive cough. No fever or chills.  No home oxygen requirement. On coumadin. Denies BRBPR or melena. Reports compliance with medications. Recent admit with dyspnea. L pleural effusion which ultimately tapped and transudative fluid likely related to CHF, but did complete a course of levaquin.    CSN: [id number]  Arrival date & time 04/08/13  4098   First MD Initiated Contact with Patient 04/08/13 0703      Chief Complaint  Patient presents with  . Shortness of Breath    (Consider location/radiation/quality/duration/timing/severity/associated sxs/prior treatment) HPI  Past Medical History  Diagnosis Date  . Allergy   . CAD (coronary artery disease)   . Hyperlipidemia   . Kidney stones   . BPH (benign prostatic hypertrophy)   . Osteoarthritis   . COPD (chronic obstructive pulmonary disease)   . Hypothyroidism   . Atrial fibrillation   . Impaired fasting glucose     Past Surgical History  Procedure Laterality Date  . Inguinal hernia repair  09/2004    left  . Back surgery  1990's    x3  . Transurethral resection of prostate  2002  . Pleural scarification  1980    right  . Esophagogastroduodenoscopy  04/2002    negative  . Bladder stone removal  06/2006    Family History  Problem Relation Age of Onset  . Heart disease Mother   . Heart disease Brother   . Stroke Brother   . Cancer Neg Hx   . Diabetes Neg Hx   . Hypertension Neg Hx   . Heart disease Brother   . Stroke Brother   . Heart disease Brother   . Stroke Brother     History  Substance Use Topics  . Smoking status: Former Smoker    Types: Cigarettes     Quit date: 12/23/2001  . Smokeless tobacco: Never Used  . Alcohol Use: No      Review of Systems  All systems reviewed and negative, other than as noted in HPI.   Allergies  Atorvastatin; Ezetimibe-simvastatin; Fenofibrate; and Penicillins  Home Medications   Current Outpatient Rx  Name  Route  Sig  Dispense  Refill  . albuterol (PROVENTIL HFA) 108 (90 BASE) MCG/ACT inhaler   Inhalation   Inhale 2 puffs into the lungs every 4 (four) hours as needed for wheezing.   6 each   2   . amitriptyline (ELAVIL) 25 MG tablet   Oral   Take 25 mg by mouth at bedtime.         Marland Kitchen aspirin 81 MG tablet   Oral   Take 81 mg by mouth every morning.          . carvedilol (COREG) 3.125 MG tablet   Oral   Take 1 tablet (3.125 mg total) by mouth 2 (two) times daily with a meal.   60 tablet   0   . diltiazem (CARDIZEM CD) 180 MG 24 hr capsule   Oral   Take 180 mg by mouth every morning.          . fluticasone (FLONASE) 50 MCG/ACT nasal spray   Nasal   Place 2 sprays into the  nose daily. 2 spray, Each Nare, Daily   16 g   0   . furosemide (LASIX) 40 MG tablet   Oral   Take 1 tablet (40 mg total) by mouth 2 (two) times daily.   60 tablet   0   . HYDROcodone-acetaminophen (NORCO/VICODIN) 5-325 MG per tablet      TAKE 1 TABLET BY MOUTH 3 TIMES A DAY AS NEEDED   90 tablet   0   . levothyroxine (SYNTHROID, LEVOTHROID) 25 MCG tablet   Oral   Take 25 mcg by mouth every morning.         . nitroGLYCERIN (NITROSTAT) 0.4 MG SL tablet   Sublingual   Place 0.4 mg under the tongue every 5 (five) minutes as needed for chest pain. x3 doses for for chest pain         . potassium chloride SA (K-DUR,KLOR-CON) 20 MEQ tablet   Oral   Take 1 tablet (20 mEq total) by mouth daily.   30 tablet   0   . pravastatin (PRAVACHOL) 20 MG tablet   Oral   Take 20 mg by mouth every morning.          Marland Kitchen RAPAFLO 8 MG CAPS   Oral   Take 8 mg by mouth every morning.          .  tiotropium (SPIRIVA) 18 MCG inhalation capsule   Inhalation   Place 18 mcg into inhaler and inhale daily.         Marland Kitchen warfarin (COUMADIN) 2.5 MG tablet   Oral   Take 2.5 mg by mouth 2 (two) times a week. Takes on Tues and Thurs in the morning         . warfarin (COUMADIN) 5 MG tablet   Oral   Take 5 mg by mouth See admin instructions. Take on Mon, Wed, Fri, Sat & Sun. Takes in the morning.           BP 135/106  Pulse 65  Temp(Src) 97.7 F (36.5 C) (Oral)  Resp 16  SpO2 100%  Physical Exam  Nursing note and vitals reviewed. Constitutional: No distress.  Frail appearing, but NAD.   HENT:  Head: Normocephalic and atraumatic.  Eyes: Conjunctivae are normal. Right eye exhibits no discharge. Left eye exhibits no discharge.  Neck: Neck supple.  Cardiovascular: Normal heart sounds.  Exam reveals no gallop and no friction rub.   No murmur heard. Tachycardic. irreg irreg.   Pulmonary/Chest: No respiratory distress. He has wheezes.  Mild tachypnea, 20-22. Faint expiratory wheezing b/l. Crackles L base.   Abdominal: Soft. He exhibits no distension. There is no tenderness.  Musculoskeletal: He exhibits no edema and no tenderness.  Lower extremities symmetric as compared to each other. No calf tenderness. Negative Homan's. No palpable cords.   Neurological: He is alert.  Skin: Skin is warm and dry.  Psychiatric: He has a normal mood and affect. His behavior is normal. Thought content normal.    ED Course  Procedures (including critical care time)  Labs Reviewed  CBC - Abnormal; Notable for the following:    Hemoglobin 12.7 (*)    HCT 37.4 (*)    All other components within normal limits  BASIC METABOLIC PANEL - Abnormal; Notable for the following:    Glucose, Bld 102 (*)    GFR calc non Af Amer 63 (*)    GFR calc Af Amer 73 (*)    All other components within normal limits  PROTIME-INR -  Abnormal; Notable for the following:    Prothrombin Time 28.1 (*)    INR 2.80 (*)     All other components within normal limits  PRO B NATRIURETIC PEPTIDE - Abnormal; Notable for the following:    Pro B Natriuretic peptide (BNP) 1755.0 (*)    All other components within normal limits  TROPONIN I   Scheduled Meds: . furosemide  40 mg Intravenous Once  - diltiazem bolus, 15 mg IV once  Continuous Infusions:  Diltiazem   Dg Chest 2 View  04/08/2013  *RADIOLOGY REPORT*  Clinical Data: Extreme shortness of breath.  History of congestive heart failure.  CHEST - 2 VIEW  Comparison: 02/23/2013 radiographs.  CT 02/23/2013.  Findings: The heart size and mediastinal contours are stable.  Left greater than right pleural effusions have mildly enlarged.  Again demonstrated are diffuse emphysematous changes with associated scarring and architectural distortion.  Left apical density is unchanged.  Compared with the prior study, the interstitial prominence is slightly greater suggesting possible superimposed edema.  There is no focal airspace disease or pneumothorax.  IMPRESSION: Enlarging bilateral pleural effusions with increased interstitial prominence suggesting possible pulmonary edema and congestive heart failure.  Underlying severe emphysema.   Original Report Authenticated By: Carey Bullocks, M.D.    EKG:  Rhythm: atrial fibrillation with RVR Rate: 149 Axis: normal ST segments: NS ST changes   1. Atrial fibrillation with rapid ventricular response   2. Dyspnea   3. CHF (congestive heart failure)   4. History of COPD       MDM  84yM with dyspnea. Likely multifactorial. Hx of COPD and some wheezing on exam. Afebrile, no leukocytosis,  no significant change in character of his cough and CXR without focal infiltrate. Abx deferred. Nebs given. Hx of CHF and re accumulation of L sided pleural effusion. BNP elevated from most recent comparison. Renal function improved from recently. Lasix. Afib with RVR. Pt with hx of same. INR therapeutic. On cardizem which he reports compliance  with. Given bolus of diltiazem and started on gtt. Rate mildly improved but persistent RVR. Albuterol likley contributing to this. Pt denies pain and fairly comfortable appearing. EKG with no overt ischemic changes and troponin normal.       Raeford Razor, MD 04/08/13 4540  Raeford Razor, MD 04/08/13 1255

## 2013-04-08 NOTE — ED Notes (Signed)
Returned from [employer]. HR remains 130-140. Cardizem gtt increased to 15 mg/hr. No pain. Skin w/d, resp e/u

## 2013-04-08 NOTE — Care Management Note (Signed)
    Page 1 of 1   04/08/2013     2:34:40 PM   CARE MANAGEMENT NOTE 04/08/2013  Patient:  [facility]   Account Number:  [id number]  Date Initiated:  04/08/2013  Documentation initiated by:  Junius Creamer  Subjective/Objective Assessment:   adm w at fib w rvr     Action/Plan:   lives w wife, pcp dr r letvak, recently w [facility] for rn and telehealth, [facility] aware pt in hosp   Anticipated DC Date:     Anticipated DC Plan:  HOME W HOME HEALTH SERVICES      DC Planning Services  CM consult      [facility] Choice  Resumption Of Svcs/PTA Provider   Choice offered to / List presented to:          [facility] arranged  HH-1 RN  HH-10 DISEASE MANAGEMENT      HH agency  [facility]   Status of service:   Medicare Important Message given?   (If response is "NO", the following Medicare IM given date fields will be blank) Date Medicare IM given:   Date Additional Medicare IM given:    Discharge Disposition:  HOME W HOME HEALTH SERVICES  Per UR Regulation:  Reviewed for med. necessity/level of care/duration of stay  If discussed at Long Length of Stay Meetings, dates discussed:    Comments:

## 2013-04-09 LAB — BASIC METABOLIC PANEL
BUN: 23 mg/dL (ref 6–23)
CO2: 25 mEq/L (ref 19–32)
Calcium: 9.4 mg/dL (ref 8.4–10.5)
Chloride: 102 mEq/L (ref 96–112)
Creatinine, Ser: 1.3 mg/dL (ref 0.50–1.35)

## 2013-04-09 MED ORDER — DILTIAZEM HCL ER COATED BEADS 120 MG PO CP24
120.0000 mg | ORAL_CAPSULE | Freq: Once | ORAL | Status: AC
  Administered 2013-04-09: 120 mg via ORAL
  Filled 2013-04-09: qty 1

## 2013-04-09 MED ORDER — DILTIAZEM HCL ER COATED BEADS 300 MG PO CP24
300.0000 mg | ORAL_CAPSULE | Freq: Every morning | ORAL | Status: DC
  Administered 2013-04-11: 300 mg via ORAL
  Filled 2013-04-09 (×3): qty 1

## 2013-04-09 MED ORDER — LEVALBUTEROL HCL 0.63 MG/3ML IN NEBU
0.6300 mg | INHALATION_SOLUTION | Freq: Four times a day (QID) | RESPIRATORY_TRACT | Status: DC
  Administered 2013-04-09 – 2013-04-12 (×10): 0.63 mg via RESPIRATORY_TRACT
  Filled 2013-04-09 (×16): qty 3

## 2013-04-09 MED ORDER — LEVALBUTEROL HCL 0.63 MG/3ML IN NEBU: 0.6300 mg | INHALATION_SOLUTION | RESPIRATORY_TRACT | Status: DC | PRN

## 2013-04-09 MED ORDER — BISACODYL 10 MG RE SUPP
10.0000 mg | Freq: Every day | RECTAL | Status: DC | PRN
  Administered 2013-04-10: 10 mg via RECTAL
  Filled 2013-04-09: qty 1

## 2013-04-09 MED ORDER — DILTIAZEM HCL ER COATED BEADS 120 MG PO CP24
120.0000 mg | ORAL_CAPSULE | Freq: Every morning | ORAL | Status: DC
  Administered 2013-04-09: 120 mg via ORAL
  Filled 2013-04-09: qty 1

## 2013-04-09 NOTE — Progress Notes (Signed)
Subjective:  Breathing is better.  Less dyspnea.  Diltiazem drip was stopped last night and now rate is up.   Objective:  Vital Signs in the last 24 hours: BP 114/55  Pulse 71  Temp(Src) 97.5 F (36.4 C) (Oral)  Resp 16  Ht 5\' 7"  (1.702 m)  Wt 55.9 kg (123 lb 3.8 oz)  BMI 19.3 kg/m2  SpO2 98%  Physical Exam: Elderly WM in NAD Lungs:  Diminished BS Cardiac: rapid  irregular rhythm, normal S1 and S2, no S3 Abdomen:  Soft, nontender, no masses Extremities:  No edema present  Intake/Output from previous day: 04/17 0701 - 04/18 0700 In: 754.5 [P.O.:580; I.V.:174.5] Out: 2825 [Urine:2825] Weight Filed Weights   04/08/13 1200 04/09/13 0400  Weight: 55.8 kg (123 lb 0.3 oz) 55.9 kg (123 lb 3.8 oz)    Lab Results: Basic Metabolic Panel:  Recent Labs  56/21/30 0709  NA 137  K 4.8  CL 104  CO2 26  GLUCOSE 102*  BUN 18  CREATININE 1.07    CBC:  Recent Labs  04/08/13 0709  WBC 6.9  HGB 12.7*  HCT 37.4*  MCV 86.2  PLT 228   BNP    Component Value Date/Time   PROBNP 1755.0* 04/08/2013 0818   PROTIME: Lab Results  Component Value Date   INR 2.39* 04/09/2013   INR 2.80* 04/08/2013   INR 2.7 03/18/2013   Telemetry: Atrial fib currently with rapid response  Assessment/Plan:  1. Rapid atrial fibrillation now 2. Diastolic CHF 3. Acute respiratory failure  Rec:  Clinically is better, but a fib rate is increased.  I will give additional oral diltiazem now. If it persits will need to go back on IV drip. Continue diuresis.   Darden Palmer  MD [facility]  04/09/2013, 10:22 AM

## 2013-04-09 NOTE — Progress Notes (Signed)
[facility] Progress Note [name] TEAM 1 - Stepdown/ICU TEAM   [name] ZOX:[id number] DOB: 04-27-1928 DOA: 04/08/2013 PCP: Tillman Abide, MD  Brief narrative: 79 year old male patient with known history of chronic atrial fibrillation as well as diastolic heart failure and prior pleural effusions. He also has COPD. Presented to the emergency department because of progressive shortness of breath duration 3 weeks with worse dyspnea on date of admission. No other symptoms noted. In the ER he was found to be in atrial fibrillation with rapid ventricular rates up to the 140s. He was started on a Cardizem drip. Was felt he had associated heart failure so he was given a dose of Lasix. After initiation of these treatments patient endorsed improvement in respiratory symptoms.  Assessment/Plan:  Atrial fibrillation with RVR -better initially this am - developed relative bradycardia with rates 57-70 and associated hypotension (SBP low 80's) prompting dc Cardizem gtt -has been started on oral cardizem but less than home dose due to soft BP -cont Coreg -now has VR > 110 so Cards has given add'l oral dose but concerned may need to go back on gtt so will remain in SDU overnight   COPD -compensated/no wheezing  Acute respiratory failure with hypoxia due to acute on Chronic diastolic heart failure -continue diuresis but monitor renal function and BP closely  Chronic kideny disease stage III  (GFR 30-59 ml/min) -slight worsening of GFR with diuresis   CAD (coronary artery disease) -enzymes stable despite RVR and no CP  Hypothyroidism -Synthroid  Long-term (current) use of anticoagulants -INR therapeutic -Pharmacy dosing   DVT prophylaxis: Warfarin Code Status: Full Family Communication: Patient Disposition Plan: Stepdown Isolation: None  Consultants: Cardiology  Procedures: None  Antibiotics: None  HPI/Subjective: Patient alert and sitting upright in bed without any  specific complaints. Endorses feels better than prior to admission.  Denies cp, n/v, or abdom pain.     Objective: Blood pressure 129/84, pulse 71, temperature 97.5 F (36.4 C), temperature source Oral, resp. rate 21, height 5\' 7"  (1.702 m), weight 55.9 kg (123 lb 3.8 oz), SpO2 98.00%.  Intake/Output Summary (Last 24 hours) at 04/09/13 1240 Last data filed at 04/09/13 0800  Gross per 24 hour  Intake 970.83 ml  Output   2825 ml  Net -1854.17 ml     Exam: General: No acute respiratory distress at rest  Lungs: without wheezes; few bibasilar crackles, 2 L Cardiovascular: Regular rate and rhythm without murmur gallop or rub normal S1 and S2, no peripheral edema or JVD Abdomen: Nontender, nondistended, soft, bowel sounds positive, no rebound, no ascites, no appreciable mass Musculoskeletal: No significant cyanosis, clubbing of bilateral lower extremities Neurological: Alert and oriented x 3, moves all extremities x 4 without focal neurological deficits  Data Reviewed: Basic Metabolic Panel:  Recent Labs Lab 04/08/13 0709 04/09/13 1115  NA 137 136  K 4.8 4.0  CL 104 102  CO2 26 25  GLUCOSE 102* 92  BUN 18 23  CREATININE 1.07 1.30  CALCIUM 9.6 9.4   CBC:  Recent Labs Lab 04/08/13 0709  WBC 6.9  HGB 12.7*  HCT 37.4*  MCV 86.2  PLT 228   Cardiac Enzymes:  Recent Labs Lab 04/08/13 0709 04/08/13 1245 04/08/13 1731 04/09/13 0027  TROPONINI <0.30 <0.30 <0.30 <0.30   BNP (last 3 results)  Recent Labs  02/21/13 1738 02/22/13 1610 04/08/13 0818  PROBNP 890.8* 1108.0* 1755.0*     Recent Results (from the past 240 hour(s))  MRSA PCR SCREENING  Status: None   Collection Time    04/08/13 12:02 PM      Result Value Range Status   MRSA by PCR NEGATIVE  NEGATIVE Final   Comment:            The GeneXpert MRSA Assay (FDA     approved for NASAL specimens     only), is one component of a     comprehensive MRSA colonization     surveillance program. It is not      intended to diagnose MRSA     infection nor to guide or     monitor treatment for     MRSA infections.     Studies:  Recent x-ray studies have been reviewed in detail by the Attending Physician  Scheduled Meds:  Reviewed in detail by the Attending Physician   Junious Silk, ANP [facility] Office  [phone] Pager [phone]  On-Call/Text Page:      [email or web address]      password [email or web address]  If 7PM-7AM, please contact night-coverage [email or web address] Password [email or web address] 04/09/2013, 12:40 PM   LOS: 1 day   I have personally examined this patient and reviewed the entire database. I have reviewed the above note, made any necessary editorial changes, and agree with its content.  Lonia Blood, MD [facility]

## 2013-04-09 NOTE — Evaluation (Signed)
Physical Therapy Evaluation Patient Details Name: [name] MRN: [id number] DOB: 02-Jan-1928 Today's Date: 04/09/2013 Time: 1435-1510 PT Time Calculation (min): 35 min  PT Assessment / Plan / Recommendation Clinical Impression  Pt admitted for SOB and tachycardia. Pt functioning near baseline however is unsteady at baseline and has had 4 falls in the last 6 months. Recommend pt use his rolling walker and to have HHPT follow to address balance deficits and use of RW. Pt safe to d/c home with 24/7 supervision of spouse, use of RW and HHPT whenever medically stable. Pt's heart rate varied between 107-137 during ambulation and SpO2 reported to be in 70 via pulse ox however pt asymptematic. Pt returned to 99% upon sitting immediately.     PT Assessment  Patient needs continued PT services    Follow Up Recommendations  Home health PT;Supervision/Assistance - 24 hour    Does the patient have the potential to tolerate intense rehabilitation      Barriers to Discharge None      Equipment Recommendations  None recommended by PT (pt has RW)    Recommendations for Other Services     Frequency Min 2X/week    Precautions / Restrictions Precautions Precautions: Fall Restrictions Weight Bearing Restrictions: No   Pertinent Vitals/Pain Pt denies pain      Mobility  Bed Mobility Bed Mobility: Supine to Sit Supine to Sit: 6: Modified independent (Device/Increase time) Details for Bed Mobility Assistance: safe technique Transfers Transfers: Sit to Stand;Stand to Sit Sit to Stand: 4: Min guard Stand to Sit: 5: Supervision Details for Transfer Assistance: increased time, cautious getting up due to not having shoes on Ambulation/Gait Ambulation/Gait Assistance: 4: Min guard Ambulation Distance (Feet): 200 Feet Assistive device: None Ambulation/Gait Assistance Details: pt unsteady requiring min guard to steady pt. recommend pt use RW due to freq falls history and pt's instabilty without  AD. Gait Pattern: Step-through pattern;Decreased stride length;Narrow base of support Gait velocity: slow General Gait Details: mild instabilty Stairs: No Wheelchair Mobility Wheelchair Mobility: No    Exercises     PT Diagnosis: Difficulty walking  PT Problem List: Decreased activity tolerance;Decreased balance;Decreased mobility PT Treatment Interventions: Gait training;Functional mobility training;Balance training   PT Goals Acute Rehab PT Goals PT Goal Formulation: With patient Time For Goal Achievement: 04/23/13 Potential to Achieve Goals: Good Pt will Ambulate: >150 feet;with modified independence;with least restrictive assistive device PT Goal: Ambulate - Progress: Goal set today Pt will Go Up / Down Stairs: 6-9 stairs;with supervision;with rail(s) PT Goal: Up/Down Stairs - Progress: Goal set today Pt will Perform Home Exercise Program: Independently PT Goal: Perform Home Exercise Program - Progress: Goal set today  Visit Information  Last PT Received On: 04/09/13 Assistance Needed: +1    Subjective Data  Subjective: Pt received supine in bed with report of nausea. Patient Stated Goal: home ASAP   Prior Functioning  Home Living Lives With: Spouse Available Help at Discharge: Family;Available 24 hours/day Type of Home: House Home Access: Stairs to enter [employer] of Steps: 5-6 Entrance Stairs-Rails: Can reach both Home Layout: One level Bathroom Shower/Tub: Tub/shower unit;Walk-in shower Bathroom Toilet: Standard Bathroom Accessibility: Yes How Accessible: Accessible via walker Home Adaptive Equipment: Straight cane;Walker - rolling Prior Function Level of Independence: Independent Able to Take Stairs?: Yes Driving: Yes Vocation: Retired Comments: pt uses straight cane but has had 4 falls in the last 6 months Communication Communication: No difficulties Dominant Hand: Right    Cognition  Cognition Arousal/Alertness: Awake/alert Behavior  During Therapy:  WFL for tasks assessed/performed Overall Cognitive Status: Within Functional Limits for tasks assessed    Extremity/Trunk Assessment Right Upper Extremity Assessment RUE ROM/Strength/Tone: Within functional levels RUE Sensation: WFL - Light Touch Left Upper Extremity Assessment LUE ROM/Strength/Tone: Within functional levels LUE Sensation: WFL - Light Touch Right Lower Extremity Assessment RLE ROM/Strength/Tone: Within functional levels RLE Sensation: WFL - Light Touch Left Lower Extremity Assessment LLE ROM/Strength/Tone: Within functional levels LLE Sensation: WFL - Light Touch Trunk Assessment Trunk Assessment: Normal   Balance    End of Session PT - End of Session Equipment Utilized During Treatment: Gait belt;Oxygen (2LO2 via [place]) Activity Tolerance: Patient tolerated treatment well Patient left: in chair;with call bell/phone within reach;with nursing in room Nurse Communication: Mobility status  GP     Marcene Brawn 04/09/2013, 3:22 PM  Lewis Shock, PT, DPT Pager #: [phone] Office #: [phone]

## 2013-04-09 NOTE — Progress Notes (Signed)
ANTICOAGULATION CONSULT NOTE - Follow Up Consult  Pharmacy Consult for Coumadin Indication: atrial fibrillation  Allergies  Allergen Reactions  . Atorvastatin Other (See Comments)    REACTION: severe muscle aches  . Ezetimibe-Simvastatin Other (See Comments)    REACTION: severe muscle aches  . Fenofibrate     REACTION: "ran me up a wall"  . Penicillins Other (See Comments)    blisters    Patient Measurements: Height: 5\' 7"  (170.2 cm) Weight: 123 lb 3.8 oz (55.9 kg) IBW/kg (Calculated) : 66.1 Heparin Dosing Weight:   Vital Signs: Temp: 97.5 F (36.4 C) (04/18 0802) Temp src: Oral (04/18 0802) BP: 114/55 mmHg (04/18 0400) Pulse Rate: 71 (04/18 0000)  Labs:  Recent Labs  04/08/13 0709 04/08/13 1245 04/08/13 1731 04/09/13 0027 04/09/13 0430  HGB 12.7*  --   --   --   --   HCT 37.4*  --   --   --   --   PLT 228  --   --   --   --   LABPROT 28.1*  --   --   --  25.0*  INR 2.80*  --   --   --  2.39*  CREATININE 1.07  --   --   --   --   TROPONINI <0.30 <0.30 <0.30 <0.30  --     Estimated Creatinine Clearance: 42.1 ml/min (by C-G formula based on Cr of 1.07).  Assessment: 84yom on chronic Coumadin for Afib. INR (2.39) remains therapeutic on home regimen (5mg  daily except 2.5mg  on Tuesdays and Thursdays) - will continue. - No CBC this AM - No significant bleeding reported  Goal of Therapy:  INR 2-3   Plan:  1. Continue home Coumadin regimen - 5mg  due tonight  2. Daily INR  Cleon Dew [phone] 04/09/2013,9:13 AM

## 2013-04-10 ENCOUNTER — Inpatient Hospital Stay (HOSPITAL_COMMUNITY): Payer: Medicare Other

## 2013-04-10 ENCOUNTER — Encounter (HOSPITAL_COMMUNITY): Payer: Self-pay | Admitting: *Deleted

## 2013-04-10 LAB — BASIC METABOLIC PANEL
BUN: 29 mg/dL — ABNORMAL HIGH (ref 6–23)
CO2: 24 mEq/L (ref 19–32)
Chloride: 99 mEq/L (ref 96–112)
Creatinine, Ser: 1.31 mg/dL (ref 0.50–1.35)
Glucose, Bld: 120 mg/dL — ABNORMAL HIGH (ref 70–99)
Potassium: 3.5 mEq/L (ref 3.5–5.1)

## 2013-04-10 LAB — CBC
HCT: 41.8 % (ref 39.0–52.0)
Hemoglobin: 14.3 g/dL (ref 13.0–17.0)
MCH: 29.2 pg (ref 26.0–34.0)
MCHC: 34.2 g/dL (ref 30.0–36.0)
MCV: 85.3 fL (ref 78.0–100.0)

## 2013-04-10 MED ORDER — WARFARIN SODIUM 5 MG PO TABS
5.0000 mg | ORAL_TABLET | Freq: Once | ORAL | Status: DC
  Filled 2013-04-10: qty 1

## 2013-04-10 MED ORDER — PEG 3350-KCL-NA BICARB-NACL 420 G PO SOLR
2000.0000 mL | Freq: Once | ORAL | Status: AC
  Administered 2013-04-10: 2000 mL via ORAL
  Filled 2013-04-10: qty 4000

## 2013-04-10 MED ORDER — DIGOXIN 0.25 MG/ML IJ SOLN
0.2500 mg | Freq: Three times a day (TID) | INTRAMUSCULAR | Status: AC
  Administered 2013-04-10 (×2): 0.25 mg via INTRAVENOUS
  Filled 2013-04-10 (×3): qty 1

## 2013-04-10 MED ORDER — POLYETHYLENE GLYCOL 3350 17 G PO PACK
17.0000 g | PACK | Freq: Two times a day (BID) | ORAL | Status: DC
  Filled 2013-04-10 (×2): qty 1

## 2013-04-10 MED ORDER — BISACODYL 10 MG RE SUPP
10.0000 mg | Freq: Two times a day (BID) | RECTAL | Status: AC
  Administered 2013-04-10 – 2013-04-11 (×4): 10 mg via RECTAL
  Filled 2013-04-10 (×4): qty 1

## 2013-04-10 MED ORDER — MORPHINE SULFATE 2 MG/ML IJ SOLN
INTRAMUSCULAR | Status: AC
  Filled 2013-04-10: qty 1

## 2013-04-10 MED ORDER — DILTIAZEM HCL 100 MG IV SOLR
5.0000 mg/h | INTRAVENOUS | Status: DC
  Administered 2013-04-10: 5 mg/h via INTRAVENOUS
  Administered 2013-04-10: 10 mg/h via INTRAVENOUS
  Administered 2013-04-10: 15 mg/h via INTRAVENOUS
  Administered 2013-04-11 – 2013-04-13 (×3): 5 mg/h via INTRAVENOUS

## 2013-04-10 MED ORDER — SIMETHICONE 80 MG PO CHEW
80.0000 mg | CHEWABLE_TABLET | Freq: Once | ORAL | Status: AC
  Administered 2013-04-10: 80 mg via ORAL
  Filled 2013-04-10: qty 1

## 2013-04-10 MED ORDER — SODIUM CHLORIDE 0.9 % IV SOLN
INTRAVENOUS | Status: DC
  Administered 2013-04-10: 19:00:00 via INTRAVENOUS
  Administered 2013-04-13: 500 mL via INTRAVENOUS
  Administered 2013-04-14: 06:00:00 via INTRAVENOUS
  Administered 2013-04-15: 10 mL/h via INTRAVENOUS

## 2013-04-10 MED ORDER — MORPHINE SULFATE 2 MG/ML IJ SOLN
2.0000 mg | Freq: Once | INTRAMUSCULAR | Status: AC
  Administered 2013-04-10: 2 mg via INTRAVENOUS
  Filled 2013-04-10: qty 1

## 2013-04-10 MED ORDER — MORPHINE SULFATE 2 MG/ML IJ SOLN
1.0000 mg | Freq: Once | INTRAMUSCULAR | Status: AC
  Administered 2013-04-10: 1 mg via INTRAVENOUS

## 2013-04-10 MED ORDER — FLEET ENEMA 7-19 GM/118ML RE ENEM
1.0000 | ENEMA | Freq: Once | RECTAL | Status: AC
  Administered 2013-04-10: 03:00:00 via RECTAL
  Filled 2013-04-10: qty 1

## 2013-04-10 MED ORDER — IOHEXOL 300 MG/ML  SOLN
80.0000 mL | Freq: Once | INTRAMUSCULAR | Status: AC | PRN
  Administered 2013-04-10: 80 mL via INTRAVENOUS

## 2013-04-10 MED ORDER — DILTIAZEM HCL 25 MG/5ML IV SOLN
15.0000 mg | Freq: Once | INTRAVENOUS | Status: AC
  Administered 2013-04-10: 15 mg via INTRAVENOUS
  Filled 2013-04-10: qty 5

## 2013-04-10 MED ORDER — IOHEXOL 300 MG/ML  SOLN
20.0000 mL | INTRAMUSCULAR | Status: AC
  Administered 2013-04-10: 50 mL via ORAL

## 2013-04-10 NOTE — Progress Notes (Signed)
Pt. was in cardizem drip at 15mg /hour HR still  in the 130-140  Dr. Terressa Koyanagi was paged and made aware. Benedetto Coons made aware also that pt. Is pleasantly confused this am. Will follow up CT scan that was ordered stat.

## 2013-04-10 NOTE — Progress Notes (Signed)
[facility] progress note. Chief complaint. Abdominal pain. History of present illness. 79 year old male in hospital with atrial fibrillation and rapid ventricular response, COPD with hypoxia, etc. He complained to nursing of severe abdominal pain initially right lower clutter and. I ordered a KUB x-ray of the abdomen which was obtained and a resulted nonspecific bowel gas pattern. He came to see the patient at the bedside and he now describes fairly severe lower abdominal pain bilateral lower quadrants. He describes a crampy type of pain and also indicates that he has not had a bowel movement in several days. He states he is barely passed gas. I initially ordered him a Dulcolax suppository but unfortunately this has not been effective. Nursing did not find any evidence of a rectal fecal impaction. Vital signs. Temperature 97.5, pulse 77, respiration 20, blood pressure 112/65. O2 sats 98%. General appearance. Well-developed elderly male who is alert, cooperative, and in no distress. Cardiac. Irregular. Lungs. Somewhat diminished in the bases. Abdomen. Soft with positive bowel sounds. There is some diffuse pain with palpation over the lower abdomen but no guarding or rebound tenderness. No clinical evidence of acute abdomen. Impression/plan. Problem #1. Abdominal pain. I suspect this secondary to constipation. His KUB x-ray was unremarkable and certainly did not indicate any obstruction or ileus. We'll proceed with a fleets enema in hopes that this will produce a bowel movement and alleviate the patient's pain.  Addendum. Following fleets enema patient did have a moderate-sized bowel movement but unfortunately has not had any significant improvement in his abdominal pain. This makes the etiology of patient's pain more unclear and I think it's reasonable to proceed with a CT scan of the abdomen/pelvis. Discussed renal function with radiology tach and current GFR is acceptable for reduced dose IV  contrast. We'll follow for results.

## 2013-04-10 NOTE — Progress Notes (Signed)
Pts. HR cont. to be in 130's-150's afib bp stable , Dr. Terressa Koyanagi made aware of pts. Symptoms earlier wether the HR related to pain, with orders made. Cardizem 15mg . Given IV push. Will cont. to monitor

## 2013-04-10 NOTE — Progress Notes (Signed)
KUB resulted [name] made aware, dulcolax supopsitory was given as ordered will cont. to monitor.

## 2013-04-10 NOTE — Progress Notes (Signed)
Pt. Cont.tTo have some more pain RLQ to the groin area, tender to touch, bowel sound present. HR still in the high 130's afib. Benedetto Coons made aware of pain with orders made. Dr. Terressa Koyanagi made aware of afib HR sustained in the 130's-150's , with order to start cardizem drip.

## 2013-04-10 NOTE — Progress Notes (Addendum)
Pt. Was given a total of 3mg . morphineIV due to pain and 1 fleets enema given will assessed pain.

## 2013-04-10 NOTE — Consult Note (Signed)
[facility] Consultation Note  Referring Provider:  Jetty Duhamel, MD [facility])  Reason for Consultation:  Constipation, abnormal CT abdomen  HPI: [name] is a 79 y.o. male with atrial fibrillation on chronic anticoagulation (warfarin), admitted a couple days with A fib with rapid ventricular response.  This has improved on diltiazem drip.  However, over the past 24 hours, patient has complained of some abdominal distention and lower abdominal discomfort.  Has had some progressive constipation, fairly new onset, over the past few weeks.  No nausea, vomiting, blood in stool.  Patient tells me his weight fluctuates, and that there has been no dramatic downtrend.  Had CT recently showing possible apple core lesion in his ascending colon; there was also lots of stool throughout the colon, including distal to the possible lesion; no small bowel dilatation is seen.  Patient endorses having colonoscopy many years ago by Dr. Randa Evens, seemingly normal.   Past Medical History  Diagnosis Date  . CAD (coronary artery disease)   . Hyperlipidemia   . Kidney stones   . BPH (benign prostatic hypertrophy)   . Osteoarthritis   . COPD (chronic obstructive pulmonary disease)   . Hypothyroidism   . Atrial fibrillation   . Impaired fasting glucose   . Myocardial infarction   . Hypertension   . Shortness of breath   . CHF (congestive heart failure)   . GERD (gastroesophageal reflux disease)     Past Surgical History  Procedure Laterality Date  . Inguinal hernia repair  09/2004    left  . Back surgery  1990's    x3  . Transurethral resection of prostate  2002  . Pleural scarification  1980    right  . Esophagogastroduodenoscopy  04/2002    negative  . Bladder stone removal  06/2006    Prior to Admission medications   Medication Sig Start Date End Date Taking? Authorizing Provider  albuterol (PROVENTIL HFA) 108 (90 BASE) MCG/ACT inhaler Inhale 2 puffs into the lungs every 4 (four)  hours as needed for wheezing. 01/27/13  Yes Spencer Copland, MD  amitriptyline (ELAVIL) 25 MG tablet Take 25 mg by mouth at bedtime.   Yes Historical Provider, MD  aspirin 81 MG tablet Take 81 mg by mouth every morning.    Yes Historical Provider, MD  carvedilol (COREG) 3.125 MG tablet Take 1 tablet (3.125 mg total) by mouth 2 (two) times daily with a meal. 02/25/13  Yes Elease Etienne, MD  diltiazem (CARDIZEM CD) 180 MG 24 hr capsule Take 180 mg by mouth every morning.  02/01/11  Yes Historical Provider, MD  fluticasone (FLONASE) 50 MCG/ACT nasal spray Place 2 sprays into the nose daily. 2 spray, Each Nare, Daily 02/25/13  Yes Elease Etienne, MD  furosemide (LASIX) 40 MG tablet Take 1 tablet (40 mg total) by mouth 2 (two) times daily. 02/25/13  Yes Elease Etienne, MD  HYDROcodone-acetaminophen (NORCO/VICODIN) 5-325 MG per tablet TAKE 1 TABLET BY MOUTH 3 TIMES A DAY AS NEEDED 03/08/13  Yes Karie Schwalbe, MD  levothyroxine (SYNTHROID, LEVOTHROID) 25 MCG tablet Take 25 mcg by mouth every morning.   Yes Historical Provider, MD  nitroGLYCERIN (NITROSTAT) 0.4 MG SL tablet Place 0.4 mg under the tongue every 5 (five) minutes as needed for chest pain. x3 doses for for chest pain 10/19/12  Yes Karie Schwalbe, MD  potassium chloride SA (K-DUR,KLOR-CON) 20 MEQ tablet Take 1 tablet (20 mEq total) by mouth daily. 02/25/13  Yes Elease Etienne, MD  RAPAFLO 8 MG CAPS Take 8 mg by mouth daily.  01/11/11  Yes Historical Provider, MD  tiotropium (SPIRIVA) 18 MCG inhalation capsule Place 18 mcg into inhaler and inhale daily.   Yes Historical Provider, MD  warfarin (COUMADIN) 2.5 MG tablet Take 2.5 mg by mouth 2 (two) times a week. Takes on Tues and Thurs in the morning 08/10/12  Yes Karie Schwalbe, MD  warfarin (COUMADIN) 5 MG tablet Take 5 mg by mouth as directed. Take on Mon, Wed, Fri, Sat & Sun. Takes in the morning.   Yes Historical Provider, MD    Current Facility-Administered Medications  Medication Dose Route  Frequency Provider Last Rate Last Dose  . acetaminophen (TYLENOL) tablet 650 mg  650 mg Oral Q6H PRN Belkys A Regalado, MD       Or  . acetaminophen (TYLENOL) suppository 650 mg  650 mg Rectal Q6H PRN Belkys A Regalado, MD      . amitriptyline (ELAVIL) tablet 25 mg  25 mg Oral QHS Belkys A Regalado, MD   25 mg at 04/09/13 2142  . aspirin EC tablet 81 mg  81 mg Oral Daily Belkys A Regalado, MD   81 mg at 04/10/13 1031  . bisacodyl (DULCOLAX) suppository 10 mg  10 mg Rectal Daily PRN Rolan Lipa, NP   10 mg at 04/10/13 0122  . carvedilol (COREG) tablet 3.125 mg  3.125 mg Oral BID WC Belkys A Regalado, MD   3.125 mg at 04/10/13 0759  . digoxin (LANOXIN) 0.25 MG/ML injection 0.25 mg  0.25 mg Intravenous TID Othella Boyer, MD   0.25 mg at 04/10/13 1119  . diltiazem (CARDIZEM CD) 24 hr capsule 300 mg  300 mg Oral q morning - 10a Othella Boyer, MD      . diltiazem (CARDIZEM) 100 mg in dextrose 5 % 100 mL infusion  5-15 mg/hr Intravenous Titrated Othella Boyer, MD 15 mL/hr at 04/10/13 1213 15 mg/hr at 04/10/13 1213  . docusate sodium (COLACE) capsule 100 mg  100 mg Oral BID Belkys A Regalado, MD   100 mg at 04/10/13 1032  . feeding supplement (ENSURE COMPLETE) liquid 237 mL  237 mL Oral BID BM Tonye Becket, RD   237 mL at 04/09/13 0944  . fluticasone (FLONASE) 50 MCG/ACT nasal spray 2 spray  2 spray Each Nare Daily Belkys A Regalado, MD   2 spray at 04/10/13 1032  . HYDROcodone-acetaminophen (NORCO/VICODIN) 5-325 MG per tablet 1-2 tablet  1-2 tablet Oral Q4H PRN Alba Cory, MD   2 tablet at 04/09/13 1749  . levalbuterol (XOPENEX) nebulizer solution 0.63 mg  0.63 mg Nebulization Q2H PRN Lonia Blood, MD      . levalbuterol [facility]) nebulizer solution 0.63 mg  0.63 mg Nebulization Q6H Lonia Blood, MD   0.63 mg at 04/10/13 0837  . levothyroxine (SYNTHROID, LEVOTHROID) tablet 25 mcg  25 mcg Oral QAC breakfast Belkys A Regalado, MD   25 mcg at 04/10/13 0759  .  morphine 2 MG/ML injection           . nitroGLYCERIN (NITROSTAT) SL tablet 0.4 mg  0.4 mg Sublingual Q5 min PRN Belkys A Regalado, MD      . ondansetron (ZOFRAN) injection 4 mg  4 mg Intravenous Q6H PRN Belkys A Regalado, MD   4 mg at 04/08/13 1654  . polyethylene glycol (MIRALAX / GLYCOLAX) packet 17 g  17 g Oral BID Lonia Blood, MD      .  potassium chloride SA (K-DUR,KLOR-CON) CR tablet 20 mEq  20 mEq Oral Daily Belkys A Regalado, MD   20 mEq at 04/10/13 1031  . tamsulosin (FLOMAX) capsule 0.4 mg  0.4 mg Oral Daily Belkys A Regalado, MD   0.4 mg at 04/10/13 1031  . tiotropium (SPIRIVA) inhalation capsule 18 mcg  18 mcg Inhalation Daily Belkys A Regalado, MD   18 mcg at 04/10/13 0837    Allergies as of 04/08/2013 - Review Complete 04/08/2013  Allergen Reaction Noted  . Atorvastatin Other (See Comments) 02/02/2007  . Ezetimibe-simvastatin Other (See Comments) 02/02/2007  . Fenofibrate    . Penicillins Other (See Comments) 02/02/2007    Family History  Problem Relation Age of Onset  . Heart disease Mother   . Heart disease Brother   . Stroke Brother   . Cancer Neg Hx   . Diabetes Neg Hx   . Hypertension Neg Hx   . Heart disease Brother   . Stroke Brother   . Heart disease Brother   . Stroke Brother     History   Social History  . Marital Status: Married    Spouse Name: N/A    Number of Children: 1  . Years of Education: N/A   Occupational History  . retired Therapist, music    Social History Main Topics  . Smoking status: Former Smoker -- 1.00 packs/day for 60 years    Types: Cigarettes    Quit date: 12/24/2011  . Smokeless tobacco: Never Used  . Alcohol Use: No  . Drug Use: No  . Sexually Active: Not on file   Other Topics Concern  . Not on file   Social History Narrative   Does lots of yard work and a big garden   Has living will   No [place] health care POA. Asks for daughter Ardeen Fillers to make decisions if needed.   Discussed DNR--- he doesn't want artificial  ventilation but isn't clear about this.   Not sure about tube feeds    Review of Systems: As per HPI; all others negative.  Physical Exam: Vital signs in last 24 hours: Temp:  [97.5 F (36.4 C)-99.4 F (37.4 C)] 97.6 F (36.4 C) (04/19 1156) Pulse Rate:  [27-145] 134 (04/19 0730) Resp:  [17-32] 24 (04/19 0730) BP: (102-143)/(53-99) 113/54 mmHg (04/19 0730) SpO2:  [69 %-100 %] 95 % (04/19 0840) FiO2 (%):  [2 %] 2 % (04/19 0700) Last BM Date: 04/10/13 General:   Alert,  Thin, chronically cachectic-appearing but is in NAD Head:  Normocephalic and atraumatic. Eyes:  Sclera clear, no icterus.   Conjunctiva pink. Ears:  Normal auditory acuity. Nose:  No deformity, discharge,  or lesions. Mouth:  No deformity or lesions.  Oropharynx pink & moist. Neck:  Supple; no masses or thyromegaly. Lungs:  Clear throughout to auscultation.   No wheezes, crackles, or rhonchi. No acute distress. Heart:  Irregularly irregular rhythm, normal rate; no murmurs, clicks, rubs,  or gallops. Abdomen:  Moderately distended with hypoactive bowel sounds; no obvious tympany.  Mild generalized tenderness without peritonitis No masses, hepatosplenomegaly or hernias noted. Normal bowel sounds, without guarding, and without rebound.     Msk:  Diffuse muscular atrophy; otherwise symmetrical without gross deformities. Normal posture. Pulses:  Normal pulses noted. Extremities:  Without clubbing or edema. Neurologic:  Alert and  oriented x4;  grossly normal neurologically. Skin:  Scattered ecchymoses, otherwise intact without significant lesions or rashes. Psych:  Alert and cooperative. Normal mood and affect.   Lab  Results:  Recent Labs  04/08/13 0709 04/10/13 0520  WBC 6.9 17.0*  HGB 12.7* 14.3  HCT 37.4* 41.8  PLT 228 261   BMET  Recent Labs  04/08/13 0709 04/09/13 1115 04/10/13 0520  NA 137 136 138  K 4.8 4.0 3.5  CL 104 102 99  CO2 26 25 24   GLUCOSE 102* 92 120*  BUN 18 23 29*  CREATININE  1.07 1.30 1.31  CALCIUM 9.6 9.4 9.8   LFT No results found for this basename: PROT, ALBUMIN, AST, ALT, ALKPHOS, BILITOT, BILIDIR, IBILI,  in the last 72 hours PT/INR  Recent Labs  04/09/13 0430 04/10/13 0520  LABPROT 25.0* 24.2*  INR 2.39* 2.29*    Studies/Results: Ct Abdomen Pelvis W Contrast  04/10/2013  *RADIOLOGY REPORT*  Clinical Data: Lower abdominal pain.  CT ABDOMEN AND PELVIS WITH CONTRAST  Technique:  Multidetector CT imaging of the abdomen and pelvis was performed following the standard protocol during bolus administration of intravenous contrast.  Contrast: 80mL OMNIPAQUE IOHEXOL 300 MG/ML  SOLN  Comparison: 08/07/2011  Findings: Bilateral pleural effusions, left larger than right. Bibasilar atelectasis.  Severe COPD changes noted in the lung bases.  Areas of scarring.  Heart is borderline in size.  Liver, gallbladder, spleen, pancreas, right adrenal are unremarkable.  Diffuse enlargement of the left adrenal gland compatible with hyperplasia.  Small bilateral renal cysts.  Area of cortical thinning and scarring.  Punctate nonobstructing stone in the upper pole of the left kidney.  Punctate nonobstructing stone in the mid and lower pole of the right kidney.  No hydronephrosis.  Large stool burden throughout the colon.  Small bowel is decompressed.  There is an area concerning for circumferential wall thickening within the ascending colon.  This is concerning for apple core lesion and colon cancer.  This is best seen on image 51 of series 2 and coronal image 23, series 400.  Urinary bladder and prostate grossly unremarkable.  No acute bony abnormality.  Degenerative changes in the lumbar spine.  IMPRESSION: Bilateral pleural effusions, left larger than right.  Bibasilar atelectasis.  COPD.  Findings concerning for apple core lesion within the ascending colon just above the cecum and colon cancer.  Recommend further evaluation with colonoscopy.  Large stool burden throughout the colon.    Original Report Authenticated By: Charlett Nose, M.D.    Dg Abd Portable 1v  04/10/2013  *RADIOLOGY REPORT*  Clinical Data: Left lower quadrant abdominal pain  PORTABLE ABDOMEN - 1 VIEW  Comparison: Portable exam 0051 hours compared to 12/02/2012  Findings: Scattered gas and stool throughout nondistended colon. Small bowel gas pattern normal. No definite evidence of obstruction or wall thickening. Degenerative disc facet disease changes lower lumbar spine. Bones demineralized. Emphysematous changes at lung bases. No urinary tract calcification.  IMPRESSION: Nonspecific bowel gas pattern.   Original Report Authenticated By: Ulyses Southward, M.D.     Impression:  1.  Abdominal pain, lower.  2.  Constipation, fairly recent onset. 3.  Abnormal CT abdomen, possible lesion in ascending colon. 4.  Atrial fibrillation with rapid ventricular response, on chronic anticoagulation.  Plan:  1.  Treat constipation (half Trilyte prep and suppositories today; hopefully another gallon of prep tomorrow). 2.  Pending improvement of coagulopathy, and ability to relieve constipation with above measures, hopefully able to proceed with colonoscopy Monday/Tuesday of next week. 3.  Will follow; thank you for the consult.   LOS: 2 days   Leontae Bostock M  04/10/2013, 1:11 PM

## 2013-04-10 NOTE — Progress Notes (Signed)
Stat portable  KUB done.

## 2013-04-10 NOTE — Progress Notes (Signed)
Subjective:  Developed RLQ abdominal pain last night and had suppository.  No N&V.  WBC up.  CT ordered for this am.  Not SOB.  A fib rate up and IV cardizem restarted.  Objective:  Vital Signs in the last 24 hours: BP 113/54  Pulse 134  Temp(Src) 99.4 F (37.4 C) (Axillary)  Resp 24  Ht 5\' 7"  (1.702 m)  Wt 55.9 kg (123 lb 3.8 oz)  BMI 19.3 kg/m2  SpO2 95%  Physical Exam: Elderly WM in NAD Lungs:  Diminished BS Cardiac:  irregular rhythm, normal S1 and S2, no S3 Abdomen:  Mild tender in suprapubic area and RLQ Extremities:  No edema present  Intake/Output from previous day: 04/18 0701 - 04/19 0700 In: 1200 [P.O.:1200] Out: 1800 [Urine:1800] Weight Filed Weights   04/08/13 1200 04/09/13 0400  Weight: 55.8 kg (123 lb 0.3 oz) 55.9 kg (123 lb 3.8 oz)    Lab Results: Basic Metabolic Panel:  Recent Labs  16/10/96 1115 04/10/13 0520  NA 136 138  K 4.0 3.5  CL 102 99  CO2 25 24  GLUCOSE 92 120*  BUN 23 29*  CREATININE 1.30 1.31    CBC:  Recent Labs  04/08/13 0709 04/10/13 0520  WBC 6.9 17.0*  HGB 12.7* 14.3  HCT 37.4* 41.8  MCV 86.2 85.3  PLT 228 261   BNP    Component Value Date/Time   PROBNP 1755.0* 04/08/2013 0818   PROTIME: Lab Results  Component Value Date   INR 2.29* 04/10/2013   INR 2.39* 04/09/2013   INR 2.80* 04/08/2013   Telemetry: Atrial fib currently with rapid response  Assessment/Plan:  1. A fib better controlled on IV 2. Diastolic CHF 3. Acute respiratory failure better 4. Abdominal pain ? cause  Rec:  Add Dig for rate control.  Continue diltiazem IV for now. Continue diuresis.  Eval abd pain.   Darden Palmer  MD [facility]  04/10/2013, 9:32 AM

## 2013-04-10 NOTE — Progress Notes (Signed)
Pt. Started complaining of severe  Intermittent RLQ sharp pain, tender to touch earlier complained no BM for at least 3 days now, abdomen softly distended + bowel sound. Mick Sell was notified with orders for dulcolax suppository. With order to hold for now after stat portable KUB was done. Will follow up result and cont. to monitor.

## 2013-04-10 NOTE — Progress Notes (Addendum)
Pt. cont. to have severe abdominal pain, guarding for pain abd. tender to to touch, with good  bowel sound. [name] aware will see pt.Marland Kitchen

## 2013-04-10 NOTE — Progress Notes (Signed)
ANTICOAGULATION CONSULT NOTE - Follow Up Consult  Pharmacy Consult for Coumadin Indication: atrial fibrillation  Allergies  Allergen Reactions  . Atorvastatin Other (See Comments)    REACTION: severe muscle aches  . Ezetimibe-Simvastatin Other (See Comments)    REACTION: severe muscle aches  . Fenofibrate     REACTION: "ran me up a wall"  . Penicillins Other (See Comments)    blisters    Patient Measurements: Height: 5\' 7"  (170.2 cm) Weight: 123 lb 3.8 oz (55.9 kg) IBW/kg (Calculated) : 66.1 Vital Signs: Temp: 99.4 F (37.4 C) (04/19 0800) Temp src: Axillary (04/19 0800) BP: 113/54 mmHg (04/19 0730) Pulse Rate: 134 (04/19 0730)  Labs:  Recent Labs  04/08/13 0709 04/08/13 1245 04/08/13 1731 04/09/13 0027 04/09/13 0430 04/09/13 1115 04/10/13 0520  HGB 12.7*  --   --   --   --   --  14.3  HCT 37.4*  --   --   --   --   --  41.8  PLT 228  --   --   --   --   --  261  LABPROT 28.1*  --   --   --  25.0*  --  24.2*  INR 2.80*  --   --   --  2.39*  --  2.29*  CREATININE 1.07  --   --   --   --  1.30 1.31  TROPONINI <0.30 <0.30 <0.30 <0.30  --   --   --     Estimated Creatinine Clearance: 34.4 ml/min (by C-G formula based on Cr of 1.31).   Medications:  Scheduled:  . amitriptyline  25 mg Oral QHS  . aspirin EC  81 mg Oral Daily  . carvedilol  3.125 mg Oral BID WC  . [COMPLETED] diltiazem  120 mg Oral Once  . diltiazem  300 mg Oral q morning - 10a  . [COMPLETED] diltiazem  15 mg Intravenous Once  . docusate sodium  100 mg Oral BID  . feeding supplement  237 mL Oral BID BM  . fluticasone  2 spray Each Nare Daily  . furosemide  40 mg Intravenous Q12H  . iohexol  20 mL Oral Q1 Hr x 2  . levalbuterol  0.63 mg Nebulization Q6H  . levothyroxine  25 mcg Oral QAC breakfast  . [COMPLETED]  morphine injection  1 mg Intravenous Once  . [COMPLETED]  morphine injection  2 mg Intravenous Once  . morphine      . potassium chloride SA  20 mEq Oral Daily  . [COMPLETED]  simethicone  80 mg Oral Once  . [COMPLETED] sodium phosphate  1 enema Rectal Once  . tamsulosin  0.4 mg Oral Daily  . tiotropium  18 mcg Inhalation Daily  . warfarin  2.5 mg Oral Custom  . warfarin  5 mg Oral Custom  . Warfarin - Pharmacist Dosing Inpatient   Does not apply q1800  . [DISCONTINUED] diltiazem  120 mg Oral q morning - 10a  . [DISCONTINUED] sodium chloride  3 mL Intravenous Q12H    Assessment: 84yom on chronic Coumadin for Afib. INR (2.29) remains therapeutic on home regimen (5mg  daily except 2.5mg  on Tuesdays and Thursdays) - will continue.  - H/H, plts wnl - No significant bleeding reported  Goal of Therapy:  INR 2-3  Plan:  1. Continue home Coumadin regimen - 5mg  due tonight  2. Daily INR  Lillia Pauls, PharmD Clinical Pharmacist Pager: [phone] Phone: [phone] 04/10/2013 8:58 AM

## 2013-04-10 NOTE — Progress Notes (Signed)
Pt given Golytely prep and instructed to attempt to drink 1 cup every 30 mins-1 hr.  Pt states that he is having "a hard time drinking it."  Currently pt has consumed less than 1/4 of ordered prep in 4 hours.  Will encourage pt to continue drinking.  Rosalyn Archambault, [facility]

## 2013-04-10 NOTE — Progress Notes (Addendum)
[facility] Progress Note [place] TEAM 1 - Stepdown/ICU TEAM   [name] YQM:[id number] DOB: [birth year] DOA: 04/08/2013 PCP: Tillman Abide, MD  Brief narrative: 79 year old male patient with known history of chronic atrial fibrillation as well as diastolic heart failure and prior pleural effusions. He also has COPD. Presented to the emergency department because of progressive shortness of breath duration 3 weeks with worse dyspnea on date of admission. No other symptoms noted. In the ER he was found to be in atrial fibrillation with rapid ventricular rates up to the 140s. He was started on a Cardizem drip. Was felt he had associated heart failure so he was given a dose of Lasix. After initiation of these treatments patient endorsed improvement in respiratory symptoms.  Assessment/Plan:  Atrial fibrillation with RVR better initially but then developed relative bradycardia with rates 57-70 and associated hypotension (SBP low 80's) prompting dc Cardizem gtt - with d/c of gtt suffered recurrence of tachycardia - chose to monitor in SDU an additional 24hrs while oral meds adjusted - required restart of IV cardizem 4/18-19 PM - rate now well controlled and currently tolerating cardizem gtt - Cardiology following    Acute respiratory failure with hypoxia / acute on chronic diastolic heart failure Resolved w/ diuresis and control of afib/RVR - hold further diuresis as pt will be limited to liquid intake   Abdominal pain with ?mass on CT  apple core lesion within the ascending colon just above the cecum noted on CT abdom - will need colonoscopy when more stable from cardiac standpoint - will limit to clear liquids only - work to [employer] burden noted on imaging - will stop coumadin and use heparin when INR below 2.0 in anticipation of needing colon bx - will transfuse FFP as indicated to allow colonoscopy depending upon timing as per GI - GI consult called to [facility]   Chronic kideny  disease stage III  (GFR 30-59 ml/min) GFR is holding steady   CAD enzymes normal despite RVR and no CP  COPD compensated/no wheezing  Hypothyroidism Synthroid continues   Long-term (current) use of anticoagulants INR therapeutic - Pharmacy dosing - will stop coumadin and use heparin when INR below 2.0 in anticipation of needing colon bx   DVT prophylaxis: Warfarin >> heparin Code Status: Full Family Communication: Patient Disposition Plan: Stepdown Isolation: None  Consultants: Cardiology [facility]  Procedures: None  Antibiotics: None  HPI/Subjective: Patient suffered with significant abdominal pain last night.  He reports this pain is resolved this morning and feels that moving his bowels was the primary thing that contributed to this.  He denies shortness of breath nausea vomiting or chest pain.  He denies recent weight loss though he does admit "my weight is up and down a lot."  He denies melena or frequent abdominal pain at home.  Objective: Blood pressure 113/54, pulse 134, temperature 99.4 F (37.4 C), temperature source Axillary, resp. rate 24, height 5\' 7"  (1.702 m), weight 55.9 kg (123 lb 3.8 oz), SpO2 95.00%.  Intake/Output Summary (Last 24 hours) at 04/10/13 1046 Last data filed at 04/10/13 0700  Gross per 24 hour  Intake    960 ml  Output   1800 ml  Net   -840 ml   Exam: General: No acute respiratory distress  Lungs: without wheezes; few bibasilar crackles are well on Cardiovascular: Regular rate without murmur gallop or rub normal S1 and S2, no peripheral edema or JVD Abdomen: Some mild tenderness to deep palpation in the  right lower quadrant, nondistended, soft, bowel sounds positive, no rebound, no ascites, no appreciable mass Musculoskeletal: No significant cyanosis, clubbing of bilateral lower extremities Neurological: Alert and oriented x 3, moves all extremities x 4 without focal neurological deficits  Data Reviewed: Basic Metabolic  Panel:  Recent Labs Lab 04/08/13 0709 04/09/13 1115 04/10/13 0520  NA 137 136 138  K 4.8 4.0 3.5  CL 104 102 99  CO2 26 25 24   GLUCOSE 102* 92 120*  BUN 18 23 29*  CREATININE 1.07 1.30 1.31  CALCIUM 9.6 9.4 9.8   CBC:  Recent Labs Lab 04/08/13 0709 04/10/13 0520  WBC 6.9 17.0*  HGB 12.7* 14.3  HCT 37.4* 41.8  MCV 86.2 85.3  PLT 228 261   Cardiac Enzymes:  Recent Labs Lab 04/08/13 0709 04/08/13 1245 04/08/13 1731 04/09/13 0027  TROPONINI <0.30 <0.30 <0.30 <0.30   BNP (last 3 results)  Recent Labs  02/21/13 1738 02/22/13 1610 04/08/13 0818  PROBNP 890.8* 1108.0* 1755.0*     Recent Results (from the past 240 hour(s))  MRSA PCR SCREENING     Status: None   Collection Time    04/08/13 12:02 PM      Result Value Range Status   MRSA by PCR NEGATIVE  NEGATIVE Final   Comment:            The GeneXpert MRSA Assay (FDA     approved for NASAL specimens     only), is one component of a     comprehensive MRSA colonization     surveillance program. It is not     intended to diagnose MRSA     infection nor to guide or     monitor treatment for     MRSA infections.     Studies:  Recent x-ray studies have been reviewed in detail by the Attending Physician  Scheduled Meds:  Reviewed in detail by the Attending Physician  Lonia Blood, MD [facility] Office  [phone] Pager [phone]  On-Call/Text Page:      [email or web address]      password [email or web address]  If 7PM-7AM, please contact night-coverage [email or web address] Password [email or web address] 04/10/2013, 10:46 AM   LOS: 2 days

## 2013-04-11 LAB — BASIC METABOLIC PANEL
CO2: 24 mEq/L (ref 19–32)
Chloride: 98 mEq/L (ref 96–112)
GFR calc Af Amer: 63 mL/min — ABNORMAL LOW (ref >90)
Potassium: 3.8 mEq/L (ref 3.5–5.1)
Sodium: 133 mEq/L — ABNORMAL LOW (ref 135–145)

## 2013-04-11 LAB — CBC
HCT: 33.3 % — ABNORMAL LOW (ref 39.0–52.0)
Hemoglobin: 11.6 g/dL — ABNORMAL LOW (ref 13.0–17.0)
MCV: 83.9 fL (ref 78.0–100.0)
RBC: 3.97 MIL/uL — ABNORMAL LOW (ref 4.22–5.81)
WBC: 10.7 10*3/uL — ABNORMAL HIGH (ref 4.0–10.5)

## 2013-04-11 LAB — PROTIME-INR: INR: 3.14 — ABNORMAL HIGH (ref 0.00–1.49)

## 2013-04-11 LAB — TYPE AND SCREEN: Antibody Screen: NEGATIVE

## 2013-04-11 MED ORDER — DIGOXIN 250 MCG PO TABS
0.2500 mg | ORAL_TABLET | Freq: Every day | ORAL | Status: AC
  Administered 2013-04-11 – 2013-04-12 (×2): 0.25 mg via ORAL
  Filled 2013-04-11 (×2): qty 1

## 2013-04-11 NOTE — Progress Notes (Signed)
Subjective:  Abdominal pain better, having prep for colonsocopy for abnl CT scan noted.  Not SOB.  No chest pain.  Objective:  Vital Signs in the last 24 hours: BP 115/48  Pulse 62  Temp(Src) 98.4 F (36.9 C) (Oral)  Resp 16  Ht 5\' 7"  (1.702 m)  Wt 55.792 kg (123 lb)  BMI 19.26 kg/m2  SpO2 97%  Physical Exam: Elderly WM in NAD Lungs:  Diminished BS Cardiac:  irregular rhythm, normal S1 and S2, no S3 Abdomen:  Mild tender in suprapubic area and RLQ Extremities:  No edema present  Intake/Output from previous day: 04/19 0701 - 04/20 0700 In: 1513.3 [P.O.:1080; I.V.:433.3] Out: 954 [Urine:950; Stool:4] Weight Filed Weights   04/08/13 1200 04/09/13 0400 04/11/13 0500  Weight: 55.8 kg (123 lb 0.3 oz) 55.9 kg (123 lb 3.8 oz) 55.792 kg (123 lb)    Lab Results: Basic Metabolic Panel:  Recent Labs  16/10/96 0520 04/11/13 0440  NA 138 133*  K 3.5 3.8  CL 99 98  CO2 24 24  GLUCOSE 120* 105*  BUN 29* 27*  CREATININE 1.31 1.20    CBC:  Recent Labs  04/10/13 0520 04/11/13 0440  WBC 17.0* 10.7*  HGB 14.3 11.6*  HCT 41.8 33.3*  MCV 85.3 83.9  PLT 261 222   BNP    Component Value Date/Time   PROBNP 1755.0* 04/08/2013 0818   PROTIME: Lab Results  Component Value Date   INR 3.14* 04/11/2013   INR 2.29* 04/10/2013   INR 2.39* 04/09/2013   Telemetry: Atrial fib currently with controlled response Assessment/Plan:  1. A fib better controlled on IV 2. Diastolic CHF 3. Acute respiratory failure better 4. Abdominal pain ? Cause currently abnormal CT scan  Rec:  With colon prep hold po cardizem and continue IV until after prep b/o absorption issues.  Continue dig for rate control.   Darden Palmer  MD [facility]  04/11/2013, 10:03 AM

## 2013-04-11 NOTE — Progress Notes (Signed)
Patient's heart rate noted to be in 50's and dropping to 40's at times. With blood pressure in 90's/50's. Dr. Donnie Aho notified. Cardizem gtt stopped at this time, should patient's HR increase later Cardizem will be restarted again. Patient comfortable, no complaints, asymptomatic with low heart rate.  [name]

## 2013-04-11 NOTE — Progress Notes (Addendum)
ANTICOAGULATION CONSULT NOTE - Follow Up Consult  Pharmacy Consult for Coumadin Indication: atrial fibrillation  Allergies  Allergen Reactions  . Atorvastatin Other (See Comments)    REACTION: severe muscle aches  . Ezetimibe-Simvastatin Other (See Comments)    REACTION: severe muscle aches  . Fenofibrate     REACTION: "ran me up a wall"  . Penicillins Other (See Comments)    blisters    Patient Measurements: Height: 5\' 7"  (170.2 cm) Weight: 123 lb (55.792 kg) IBW/kg (Calculated) : 66.1 Vital Signs: Temp: 98 F (36.7 C) (04/20 0348) Temp src: Oral (04/20 0348) BP: 115/48 mmHg (04/20 0600) Pulse Rate: 62 (04/19 2150)  Labs:  Recent Labs  04/08/13 1245 04/08/13 1731 04/09/13 0027 04/09/13 0430 04/09/13 1115 04/10/13 0520 04/11/13 0440  HGB  --   --   --   --   --  14.3 11.6*  HCT  --   --   --   --   --  41.8 33.3*  PLT  --   --   --   --   --  261 222  LABPROT  --   --   --  25.0*  --  24.2* 30.6*  INR  --   --   --  2.39*  --  2.29* 3.14*  CREATININE  --   --   --   --  1.30 1.31 1.20  TROPONINI <0.30 <0.30 <0.30  --   --   --   --     Estimated Creatinine Clearance: 37.5 ml/min (by C-G formula based on Cr of 1.2).   Medications:  Scheduled:  . amitriptyline  25 mg Oral QHS  . aspirin EC  81 mg Oral Daily  . bisacodyl  10 mg Rectal BID  . carvedilol  3.125 mg Oral BID WC  . digoxin  0.25 mg Intravenous TID  . diltiazem  300 mg Oral q morning - 10a  . feeding supplement  237 mL Oral BID BM  . fluticasone  2 spray Each Nare Daily  . [EXPIRED] iohexol  20 mL Oral Q1 Hr x 2  . levalbuterol  0.63 mg Nebulization Q6H  . levothyroxine  25 mcg Oral QAC breakfast  . [EXPIRED] morphine      . [COMPLETED] polyethylene glycol-electrolytes  2,000 mL Oral Once  . potassium chloride SA  20 mEq Oral Daily  . tamsulosin  0.4 mg Oral Daily  . tiotropium  18 mcg Inhalation Daily  . [DISCONTINUED] docusate sodium  100 mg Oral BID  . [DISCONTINUED] furosemide  40 mg  Intravenous Q12H  . [DISCONTINUED] polyethylene glycol  17 g Oral BID  . [DISCONTINUED] warfarin  2.5 mg Oral Custom  . [DISCONTINUED] warfarin  5 mg Oral Custom  . [DISCONTINUED] warfarin  5 mg Oral ONCE-1800  . [DISCONTINUED] Warfarin - Pharmacist Dosing Inpatient   Does not apply q1800    Assessment: [name] on chronic Coumadin for Afib. INR is now supratherapeutic (3.14) despite holding dose last night. Rise in INR is likely due to prior doses given before 4/19. Home regimen (5mg  daily except 2.5mg  on Tuesdays and Thursdays) - will continue to hold Coumadin and start heparin when INR < 2 in hopes of colonoscopy early this week.  - H/H, plts low but stable - No significant bleeding reported  Goal of Therapy:  INR 2-3  Plan:  1. HOLD Coumadin tonight 2. Daily INR 3. Planning to start heparin gtt when INR < 2  Thank you, Franchot Erichsen,  Pharm.D. Clinical Pharmacist   Pager: [phone] Phone: [phone] 04/11/2013 7:41 AM

## 2013-04-11 NOTE — Progress Notes (Signed)
Subjective: Abdominal pain improving. Had half-gallon of Golytely and some dulcolax suppositories, with large stool output (but is still having semisolid stool output).  Objective: Vital signs in last 24 hours: Temp:  [97.8 F (36.6 C)-98.4 F (36.9 C)] 98.2 F (36.8 C) (04/20 1141) Pulse Rate:  [52-73] 62 (04/19 2150) Resp:  [16-20] 18 (04/20 1141) BP: (92-137)/(38-65) 115/60 mmHg (04/20 0801) SpO2:  [94 %-100 %] 96 % (04/20 1141) Weight:  [55.792 kg (123 lb)] 55.792 kg (123 lb) (04/20 0500) Weight change:  Last BM Date: 04/10/13  PE: GEN:  Chronically ill-appearing, NAD ABD:  Mild lower abdominal tenderness (improved cf. 4/19), hypoactive but present bowel sounds  Lab Results: CBC    Component Value Date/Time   WBC 10.7* 04/11/2013 0440   RBC 3.97* 04/11/2013 0440   HGB 11.6* 04/11/2013 0440   HCT 33.3* 04/11/2013 0440   PLT 222 04/11/2013 0440   MCV 83.9 04/11/2013 0440   MCH 29.2 04/11/2013 0440   MCHC 34.8 04/11/2013 0440   RDW 14.7 04/11/2013 0440   LYMPHSABS 0.9 03/11/2013 1714   MONOABS 0.7 03/11/2013 1714   EOSABS 0.5 03/11/2013 1714   BASOSABS 0.2* 03/11/2013 1714   CMP     Component Value Date/Time   NA 133* 04/11/2013 0440   K 3.8 04/11/2013 0440   CL 98 04/11/2013 0440   CO2 24 04/11/2013 0440   GLUCOSE 105* 04/11/2013 0440   BUN 27* 04/11/2013 0440   CREATININE 1.20 04/11/2013 0440   CALCIUM 9.2 04/11/2013 0440   PROT 7.3 02/22/2013 0135   ALBUMIN 3.5 02/22/2013 0135   AST 18 02/22/2013 0135   ALT 15 02/22/2013 0135   ALKPHOS 93 02/22/2013 0135   BILITOT 0.4 02/22/2013 0135   GFRNONAA 54* 04/11/2013 0440   GFRAA 63* 04/11/2013 0440   Assessment:  1.  Constipation. 2.  Abdominal pain. 3.  Abnormal CT abdomen, proximal ascending colon thickening worrisome for mass.  Plan:  1.  Continue slow peroral bowel preparation.  Suspect he will need another day or two of prep, in light of significant constipation, prior to being able to proceed with colonoscopy. 2.  Abdominal xray  tomorrow morning to revisit fecal burden. 3.  Tentative plan for colonoscopy Tuesday 4/22.   [name] 04/11/2013, 11:58 AM

## 2013-04-11 NOTE — Progress Notes (Signed)
[facility] Progress Note [place] TEAM 1 - Stepdown/ICU TEAM   [name] JYN:[id number] DOB: 11-14-1928 DOA: 04/08/2013 PCP: Tillman Abide, MD  Brief narrative: 79 year old male patient with known history of chronic atrial fibrillation as well as diastolic heart failure and prior pleural effusions. He also has COPD. Presented to the emergency department because of progressive shortness of breath duration 3 weeks with worse dyspnea on date of admission. No other symptoms noted. In the ER he was found to be in atrial fibrillation with rapid ventricular rates up to the 140s. He was started on a Cardizem drip. Was felt he had associated heart failure so he was given a dose of Lasix. After initiation of these treatments patient endorsed improvement in respiratory symptoms.  Assessment/Plan:  Atrial fibrillation with RVR rate now well controlled and currently tolerating cardizem gtt - encountered difficulty w/ repeat RVR as soon as diltiazem gtt stopped previously - Cardiology following    Acute respiratory failure with hypoxia / acute on chronic diastolic heart failure Resolved w/ diuresis and control of afib/RVR - hold further diuresis for now as pt will be limited to liquid intake and experiencing GI losses w/ colon prep - follow Is/Os  Abdominal pain with ?mass on CT  apple core lesion within the ascending colon just above the cecum noted on CT abdom - plan for colo Mon or Tues - stopped coumadin - use heparin when INR below 2.0 in anticipation of colon bx - will transfuse FFP to allow colonoscopy - GI following   Chronic kideny disease stage III  (GFR 30-59 ml/min) GFR is holding steady   CAD enzymes normal despite RVR - no CP  COPD compensated  Hypothyroidism Synthroid continues   Long-term (current) use of anticoagulants INR remains therapeutic - see discussion above   DVT prophylaxis: Warfarin >> heparin Code Status: Full Family Communication: Patient Disposition  Plan: Stepdown Isolation: None  Consultants: Cardiology [facility]  Procedures: None  Antibiotics: None  HPI/Subjective: The patient is in good spirits.  He is beginning to have diarrhea related to his colon prep.  He denies chest pain shortness of breath abdominal pain fevers or chills.  Objective: Blood pressure 115/48, pulse 62, temperature 98.4 F (36.9 C), temperature source Oral, resp. rate 16, height 5\' 7"  (1.702 m), weight 55.792 kg (123 lb), SpO2 97.00%.  Intake/Output Summary (Last 24 hours) at 04/11/13 0850 Last data filed at 04/11/13 0700  Gross per 24 hour  Intake 1513.33 ml  Output    954 ml  Net 559.33 ml   Exam: General: No acute respiratory distress  Lungs: Clear to auscultation throughout with no wheezes or crackles Cardiovascular: Regular rate without murmur gallop or rub normal S1 and S2, no peripheral edema or JVD Abdomen: Soft bowel sounds present no organomegaly no rebound no ascites Musculoskeletal: No significant cyanosis, clubbing of bilateral lower extremities Neurological: Alert and oriented x 3, moves all extremities x 4 without focal neurological deficits  Data Reviewed: Basic Metabolic Panel:  Recent Labs Lab 04/08/13 0709 04/09/13 1115 04/10/13 0520 04/11/13 0440  NA 137 136 138 133*  K 4.8 4.0 3.5 3.8  CL 104 102 99 98  CO2 26 25 24 24   GLUCOSE 102* 92 120* 105*  BUN 18 23 29* 27*  CREATININE 1.07 1.30 1.31 1.20  CALCIUM 9.6 9.4 9.8 9.2   CBC:  Recent Labs Lab 04/08/13 0709 04/10/13 0520 04/11/13 0440  WBC 6.9 17.0* 10.7*  HGB 12.7* 14.3 11.6*  HCT 37.4* 41.8 33.3*  MCV 86.2 85.3 83.9  PLT 228 261 222   Cardiac Enzymes:  Recent Labs Lab 04/08/13 0709 04/08/13 1245 04/08/13 1731 04/09/13 0027  TROPONINI <0.30 <0.30 <0.30 <0.30   BNP (last 3 results)  Recent Labs  02/21/13 1738 02/22/13 1610 04/08/13 0818  PROBNP 890.8* 1108.0* 1755.0*     Recent Results (from the past 240 hour(s))  MRSA PCR SCREENING      Status: None   Collection Time    04/08/13 12:02 PM      Result Value Range Status   MRSA by PCR NEGATIVE  NEGATIVE Final   Comment:            The GeneXpert MRSA Assay (FDA     approved for NASAL specimens     only), is one component of a     comprehensive MRSA colonization     surveillance program. It is not     intended to diagnose MRSA     infection nor to guide or     monitor treatment for     MRSA infections.     Studies:  Recent x-ray studies have been reviewed in detail by the Attending Physician  Scheduled Meds:  Reviewed in detail by the Attending Physician  Lonia Blood, MD [facility] Office  [phone] Pager [phone]  On-Call/Text Page:      [email or web address]      password [email or web address]  If 7PM-7AM, please contact night-coverage [email or web address] Password [email or web address] 04/11/2013, 8:50 AM   LOS: 3 days

## 2013-04-12 ENCOUNTER — Inpatient Hospital Stay (HOSPITAL_COMMUNITY): Payer: Medicare Other

## 2013-04-12 LAB — CBC
HCT: 31.3 % — ABNORMAL LOW (ref 39.0–52.0)
MCH: 29 pg (ref 26.0–34.0)
MCV: 85.5 fL (ref 78.0–100.0)
Platelets: 179 10*3/uL (ref 150–400)
RBC: 3.66 MIL/uL — ABNORMAL LOW (ref 4.22–5.81)
WBC: 6.6 10*3/uL (ref 4.0–10.5)

## 2013-04-12 LAB — BASIC METABOLIC PANEL
BUN: 20 mg/dL (ref 6–23)
Calcium: 9.3 mg/dL (ref 8.4–10.5)
Chloride: 102 mEq/L (ref 96–112)
Creatinine, Ser: 1.03 mg/dL (ref 0.50–1.35)
GFR calc Af Amer: 76 mL/min — ABNORMAL LOW (ref >90)
GFR calc non Af Amer: 66 mL/min — ABNORMAL LOW (ref >90)

## 2013-04-12 LAB — PREPARE FRESH FROZEN PLASMA: Unit division: 0

## 2013-04-12 LAB — HEPATIC FUNCTION PANEL
ALT: 27 U/L (ref 0–53)
AST: 27 U/L (ref 0–37)
Bilirubin, Direct: 0.2 mg/dL (ref 0.0–0.3)
Indirect Bilirubin: 0.4 mg/dL (ref 0.3–0.9)
Total Bilirubin: 0.6 mg/dL (ref 0.3–1.2)

## 2013-04-12 LAB — PROTIME-INR
INR: 1.65 — ABNORMAL HIGH (ref 0.00–1.49)
Prothrombin Time: 18.9 seconds — ABNORMAL HIGH (ref 11.6–15.2)

## 2013-04-12 MED ORDER — PEG 3350-KCL-NA BICARB-NACL 420 G PO SOLR
4000.0000 mL | Freq: Once | ORAL | Status: AC
  Administered 2013-04-12: 4000 mL via ORAL
  Filled 2013-04-12: qty 4000

## 2013-04-12 MED ORDER — FUROSEMIDE 40 MG PO TABS
40.0000 mg | ORAL_TABLET | Freq: Every day | ORAL | Status: DC
  Administered 2013-04-12 – 2013-04-13 (×2): 40 mg via ORAL
  Filled 2013-04-12 (×3): qty 1

## 2013-04-12 MED ORDER — POTASSIUM CHLORIDE CRYS ER 20 MEQ PO TBCR
40.0000 meq | EXTENDED_RELEASE_TABLET | Freq: Two times a day (BID) | ORAL | Status: AC
  Administered 2013-04-12 – 2013-04-13 (×3): 40 meq via ORAL
  Filled 2013-04-12 (×3): qty 2

## 2013-04-12 MED ORDER — LEVALBUTEROL HCL 0.63 MG/3ML IN NEBU
0.6300 mg | INHALATION_SOLUTION | Freq: Three times a day (TID) | RESPIRATORY_TRACT | Status: DC
  Administered 2013-04-13 (×2): 0.63 mg via RESPIRATORY_TRACT
  Filled 2013-04-12 (×3): qty 3

## 2013-04-12 MED ORDER — HEPARIN (PORCINE) IN NACL 100-0.45 UNIT/ML-% IJ SOLN
1000.0000 [IU]/h | INTRAMUSCULAR | Status: DC
  Administered 2013-04-12: 700 [IU]/h via INTRAVENOUS
  Filled 2013-04-12 (×2): qty 250

## 2013-04-12 NOTE — Progress Notes (Signed)
Physical Therapy Treatment Patient Details Name: [name] MRN: [id number] DOB: Nov 20, 1928 Today's Date: 04/12/2013 Time: 0945-1000 PT Time Calculation (min): 15 min  PT Assessment / Plan / Recommendation Comments on Treatment Session  Pt on RA this date and noted decreased ambulation tolerance and increeased SOB compared to Friday when patient was on 2Lo2. Pt remains to be at increased falls risk and con't to recommend use of RW to decrease falls risk.    Follow Up Recommendations  Home health PT;Supervision/Assistance - 24 hour     Does the patient have the potential to tolerate intense rehabilitation     Barriers to Discharge        Equipment Recommendations  None recommended by PT    Recommendations for Other Services    Frequency Min 2X/week   Plan Discharge plan remains appropriate    Precautions / Restrictions Precautions Precautions: Fall Restrictions Weight Bearing Restrictions: No Other Position/Activity Restrictions: aw   Pertinent Vitals/Pain Pt did not rate bilat LE pain but reports pain in bilat lower leg with amb    Mobility  Bed Mobility Bed Mobility: Supine to Sit Supine to Sit: 6: Modified independent (Device/Increase time) Details for Bed Mobility Assistance: safe technique Transfers Transfers: Sit to Stand;Stand to Sit Sit to Stand: 4: Min guard Stand to Sit: 5: Supervision Stand Pivot Transfers: 5: Supervision (to BSC, pt used bed rail and arm rests) Details for Transfer Assistance: pt requires use of bed rail for stability Ambulation/Gait Ambulation/Gait Assistance: 4: Min guard Ambulation Distance (Feet): 150 Feet Assistive device: None Ambulation/Gait Assistance Details: pt mildly unsteady with tendency to lean to Right or loose balance to R. Pt with c/o bilat lower leg pain. pt reports "The doctors know about it."RN also notified. Pt with + SOB t/o amb as well however unable to get accurate SpO2 reading.  Gait Pattern: Step-through  pattern;Decreased stride length;Narrow base of support General Gait Details: mild instability Stairs: No    Exercises     PT Diagnosis:    PT Problem List:   PT Treatment Interventions:     PT Goals Acute Rehab PT Goals PT Goal: Ambulate - Progress: Progressing toward goal PT Goal: Up/Down Stairs - Progress: Progressing toward goal PT Goal: Perform Home Exercise Program - Progress: Progressing toward goal  Visit Information  Last PT Received On: 04/12/13 Assistance Needed: +1    Subjective Data  Subjective: Pt received supine in bed upset they cancelled his colonscopy today. Pt agreeable to PT Patient Stated Goal: home   Cognition  Cognition Arousal/Alertness: Awake/alert Behavior During Therapy: WFL for tasks assessed/performed Overall Cognitive Status:  (decreased insight to deficits)    Balance     End of Session PT - End of Session Equipment Utilized During Treatment: Gait belt Activity Tolerance: Patient limited by fatigue;Patient limited by pain Patient left: in chair;with call bell/phone within reach;with nursing in room Nurse Communication: Mobility status   GP     Marcene Brawn 04/12/2013, 10:20 AM  Lewis Shock, PT, DPT Pager #: [phone] Office #: [phone]

## 2013-04-12 NOTE — Progress Notes (Signed)
Subjective:  Called by nurse last night for reduced heart rate and diltiazem drip stopped. This am rate around 90-110.  To have colonoscopy tomorrow. Mild SOB with activity.   Objective:  Vital Signs in the last 24 hours: BP 141/66  Pulse 100  Temp(Src) 97.5 F (36.4 C) (Oral)  Resp 18  Ht 5\' 7"  (1.702 m)  Wt 57.6 kg (126 lb 15.8 oz)  BMI 19.88 kg/m2  SpO2 96%  Physical Exam: Elderly WM in NAD Lungs:  Diminished BS Cardiac:  irregular rhythm, normal S1 and S2, no S3 Extremities:  No edema present  Intake/Output from previous day: 04/20 0701 - 04/21 0700 In: 2571.7 [P.O.:840; I.V.:619.2; Blood:1112.5] Out: 400 [Urine:400] Weight Filed Weights   04/09/13 0400 04/11/13 0500 04/12/13 0433  Weight: 55.9 kg (123 lb 3.8 oz) 55.792 kg (123 lb) 57.6 kg (126 lb 15.8 oz)    Lab Results: Basic Metabolic Panel:  Recent Labs  13/08/65 0440 04/12/13 0535  NA 133* 137  K 3.8 3.5  CL 98 102  CO2 24 25  GLUCOSE 105* 89  BUN 27* 20  CREATININE 1.20 1.03    CBC:  Recent Labs  04/11/13 0440 04/12/13 0535  WBC 10.7* 6.6  HGB 11.6* 10.6*  HCT 33.3* 31.3*  MCV 83.9 85.5  PLT 222 179   BNP    Component Value Date/Time   PROBNP 1755.0* 04/08/2013 0818   PROTIME: Lab Results  Component Value Date   INR 1.64* 04/12/2013   INR 2.14* 04/11/2013   INR 3.14* 04/11/2013   Telemetry: Atrial fib currently with controlled response Assessment/Plan:  1. A fib with variable control 2. Diastolic CHF 3. Abdominal pain ? Cause currently abnormal CT scan for colonoscopy  Rec:  Go ahead and restart IV Cardizem as rate is drifting up. Watch breathing.   Darden Palmer  MD [facility]  04/12/2013, 8:48 AM

## 2013-04-12 NOTE — Progress Notes (Signed)
ANTICOAGULATION CONSULT NOTE - Follow Up Consult  Pharmacy Consult for heparin Indication: atrial fibrillation  Allergies  Allergen Reactions  . Atorvastatin Other (See Comments)    REACTION: severe muscle aches  . Ezetimibe-Simvastatin Other (See Comments)    REACTION: severe muscle aches  . Fenofibrate     REACTION: "ran me up a wall"  . Penicillins Other (See Comments)    blisters    Patient Measurements: Height: 5\' 7"  (170.2 cm) Weight: 126 lb 15.8 oz (57.6 kg) IBW/kg (Calculated) : 66.1 Heparin dosing wt: 57kg  Vital Signs: Temp: 96.3 F (35.7 C) (04/21 2000) Temp src: Oral (04/21 2000) BP: 123/57 mmHg (04/21 1600) Pulse Rate: 86 (04/21 1225)  Labs:  Recent Labs  04/10/13 0520 04/11/13 0440 04/11/13 1621 04/12/13 0535 04/12/13 2016  HGB 14.3 11.6*  --  10.6*  --   HCT 41.8 33.3*  --  31.3*  --   PLT 261 222  --  179  --   LABPROT 24.2* 30.6* 23.0* 18.9* 19.0*  INR 2.29* 3.14* 2.14* 1.64* 1.65*  HEPARINUNFRC  --   --   --   --  <0.10*  CREATININE 1.31 1.20  --  1.03  --     Estimated Creatinine Clearance: 45 ml/min (by C-G formula based on Cr of 1.03).   Medications:  Scheduled:  . amitriptyline  25 mg Oral QHS  . [COMPLETED] bisacodyl  10 mg Rectal BID  . carvedilol  3.125 mg Oral BID WC  . [COMPLETED] digoxin  0.25 mg Oral Daily  . feeding supplement  237 mL Oral BID BM  . fluticasone  2 spray Each Nare Daily  . furosemide  40 mg Oral Daily  . [START ON 04/13/2013] levalbuterol  0.63 mg Nebulization TID  . levothyroxine  25 mcg Oral QAC breakfast  . [COMPLETED] polyethylene glycol-electrolytes  4,000 mL Oral Once  . potassium chloride SA  40 mEq Oral BID  . tamsulosin  0.4 mg Oral Daily  . tiotropium  18 mcg Inhalation Daily  . [DISCONTINUED] levalbuterol  0.63 mg Nebulization Q6H  . [DISCONTINUED] potassium chloride SA  20 mEq Oral Daily    Assessment: 79 y/o male patient  on chronic Coumadin for Afib. INR is now subtherapeutic (1.6). Home  regimen (5mg  daily except 2.5mg  on Tuesdays and Thursdays) - will continue to hold Coumadin and  heparin started now that INR < 2 with plans for colonoscopy 4/22. Heparin level undetectable, will increase gtt rate.  Goal of Therapy:  Heparin level goal 0.3-0.7  Plan:  Increase heparin gtt to 900 units/hr and f/u in am.  Verlene Mayer, PharmD, BCPS Pager [phone] 04/12/2013 9:24 PM

## 2013-04-12 NOTE — Progress Notes (Signed)
[facility] Progress Note [place] TEAM 1 - Stepdown/ICU TEAM   [name] MRN:[id number] DOB: 03/26/1928 DOA: 04/08/2013 PCP: Richard Letvak, MD  Brief narrative: 79-year-old male patient with known history of chronic atrial fibrillation as well as diastolic heart failure and prior pleural effusions. He also has COPD. Presented to the emergency department because of progressive shortness of breath duration 3 weeks with worse dyspnea on date of admission. No other symptoms noted. In the ER he was found to be in atrial fibrillation with rapid ventricular rates up to the 140s. He was started on a Cardizem drip. Was felt he had associated heart failure so he was given a dose of Lasix. After initiation of these treatments patient endorsed improvement in respiratory symptoms.  Assessment/Plan:  Atrial fibrillation with RVR Has proven to be quite challenging to control - CCB gtt stopped last night due to drops in BP and HR - this morning, gtt had to be resumed due to progressive tachycardia - keep in SDU for close monitoring as GI prep and colo likely to aggravate rate control further - Cardiology following and directing meds  Acute respiratory failure with hypoxia / acute on chronic diastolic heart failure Resolved w/ diuresis and control of afib/RVR - holding further diuresis as pt limited to liquid intake and experiencing GI losses w/ colon prep - Is/Os however note signif + balance last 24hrs and weight up (55.7 > 57.6kg) - will resume lasix at half usual dose today   Abdominal pain with ?mass on CT  apple core lesion within the ascending colon just above the cecum noted on CT abdom - plan for colo Tues - stopped coumadin - heparin when INR below 2.0 in anticipation of colon bx - transfused FFP to allow colonoscopy - GI following - recheck INR this evening, and if INR not further improved, will give low dose vitamin K x1   Chronic kideny disease stage III  (GFR 30-59 ml/min) GFR is  holding steady   CAD enzymes normal despite RVR - no CP  COPD compensated  Hypothyroidism Synthroid continues   Long-term (current) use of anticoagulants see discussion above   DVT prophylaxis: Warfarin >> heparin Code Status: Full Family Communication: Patient Disposition Plan: Stepdown Isolation: None  Consultants: Cardiology [facility]  Procedures: None  Antibiotics: None  HPI/Subjective: The pt reports some DOE this morning.  He denies cp, n/v, or abdom pain.  He is thus far tolerating his colon prep w/o major issue.    Objective: Blood pressure 125/70, pulse 86, temperature 97.7 F (36.5 C), temperature source Oral, resp. rate 20, height 5' 7" (1.702 m), weight 57.6 kg (126 lb 15.8 oz), SpO2 95.00%.  Intake/Output Summary (Last 24 hours) at 04/12/13 1241 Last data filed at 04/12/13 1200  Gross per 24 hour  Intake 2656.67 ml  Output      0 ml  Net 2656.67 ml   Exam: General: No acute respiratory distress  Lungs: Clear to auscultation throughout with no wheezes or crackles Cardiovascular: Regular rate without murmur gallop or rub - no peripheral edema or JVD Abdomen: Soft, bowel sounds present, no organomegaly, no rebound, no ascites Musculoskeletal: No significant cyanosis, clubbing of bilateral lower extremities  Data Reviewed: Basic Metabolic Panel:  Recent Labs Lab 04/08/13 0709 04/09/13 1115 04/10/13 0520 04/11/13 0440 04/12/13 0535  NA 137 136 138 133* 137  K 4.8 4.0 3.5 3.8 3.5  CL 104 102 99 98 102  CO2 26 25 24 24 25  GLUCOSE 102* 92   120* 105* 89  BUN 18 23 29* 27* 20  CREATININE 1.07 1.30 1.31 1.20 1.03  CALCIUM 9.6 9.4 9.8 9.2 9.3   CBC:  Recent Labs Lab 04/08/13 0709 04/10/13 0520 04/11/13 0440 04/12/13 0535  WBC 6.9 17.0* 10.7* 6.6  HGB 12.7* 14.3 11.6* 10.6*  HCT 37.4* 41.8 33.3* 31.3*  MCV 86.2 85.3 83.9 85.5  PLT 228 261 222 179   Cardiac Enzymes:  Recent Labs Lab 04/08/13 0709 04/08/13 1245 04/08/13 1731  04/09/13 0027  TROPONINI <0.30 <0.30 <0.30 <0.30   BNP (last 3 results)  Recent Labs  02/21/13 1738 02/22/13 1610 04/08/13 0818  PROBNP 890.8* 1108.0* 1755.0*     Recent Results (from the past 240 hour(s))  MRSA PCR SCREENING     Status: None   Collection Time    04/08/13 12:02 PM      Result Value Range Status   MRSA by PCR NEGATIVE  NEGATIVE Final   Comment:            The GeneXpert MRSA Assay (FDA     approved for NASAL specimens     only), is one component of a     comprehensive MRSA colonization     surveillance program. It is not     intended to diagnose MRSA     infection nor to guide or     monitor treatment for     MRSA infections.     Studies:  Recent x-ray studies have been reviewed in detail by the Attending Physician  Scheduled Meds:  Reviewed in detail by the Attending Physician  Jeffrey T. McClung, MD [facility] Office  [phone] Pager [phone]  On-Call/Text Page:      [email or web address]      password [email or web address]  If 7PM-7AM, please contact night-coverage [email or web address] Password [email or web address] 04/12/2013, 12:41 PM   LOS: 4 days     

## 2013-04-12 NOTE — Progress Notes (Signed)
ANTICOAGULATION CONSULT NOTE - Follow Up Consult  Pharmacy Consult for heparin bridge Indication: atrial fibrillation  Allergies  Allergen Reactions  . Atorvastatin Other (See Comments)    REACTION: severe muscle aches  . Ezetimibe-Simvastatin Other (See Comments)    REACTION: severe muscle aches  . Fenofibrate     REACTION: "ran me up a wall"  . Penicillins Other (See Comments)    blisters    Patient Measurements: Height: 5\' 7"  (170.2 cm) Weight: 126 lb 15.8 oz (57.6 kg) IBW/kg (Calculated) : 66.1 Heparin dosing wt: 57kg Vital Signs: Temp: 97.5 F (36.4 C) (04/21 0740) Temp src: Oral (04/21 0740) BP: 141/66 mmHg (04/21 0740) Pulse Rate: 100 (04/21 0740)  Labs:  Recent Labs  04/10/13 0520 04/11/13 0440 04/11/13 1621 04/12/13 0535  HGB 14.3 11.6*  --  10.6*  HCT 41.8 33.3*  --  31.3*  PLT 261 222  --  179  LABPROT 24.2* 30.6* 23.0* 18.9*  INR 2.29* 3.14* 2.14* 1.64*  CREATININE 1.31 1.20  --  1.03    Estimated Creatinine Clearance: 45 ml/min (by C-G formula based on Cr of 1.03).   Medications:  Scheduled:  . amitriptyline  25 mg Oral QHS  . [COMPLETED] bisacodyl  10 mg Rectal BID  . carvedilol  3.125 mg Oral BID WC  . digoxin  0.25 mg Oral Daily  . feeding supplement  237 mL Oral BID BM  . fluticasone  2 spray Each Nare Daily  . levalbuterol  0.63 mg Nebulization Q6H  . levothyroxine  25 mcg Oral QAC breakfast  . polyethylene glycol-electrolytes  4,000 mL Oral Once  . potassium chloride SA  20 mEq Oral Daily  . tamsulosin  0.4 mg Oral Daily  . tiotropium  18 mcg Inhalation Daily    Assessment: 84yom on chronic Coumadin for Afib. INR is now subtherapeutic (1.6). Home regimen (5mg  daily except 2.5mg  on Tuesdays and Thursdays) - will continue to hold Coumadin and start heparin now that INR < 2 with plans for colonoscopy 4/22- H/H, plts trending down - No significant bleeding reported  Goal of Therapy:  INR 2-3 Heparin level goal 0.3-0.7  Plan:  1.  Continue to HOLD Coumadin  2. Daily INR 3. Start IV heparin at 700 units/hr - no bolus 4. Check HL in 8 hours  Thank you,  Sheppard Coil PharmD., BCPS Clinical Pharmacist Pager [phone] 04/12/2013 10:46 AM

## 2013-04-12 NOTE — Progress Notes (Signed)
UR Completed.  [name] Jane [phone] 04/12/2013  

## 2013-04-12 NOTE — Evaluation (Signed)
Occupational Therapy Evaluation Patient Details Name: [name] MRN: [id number] DOB: 04/20/1928 Today's Date: 04/12/2013 Time: 4098-1191 OT Time Calculation (min): 21 min  OT Assessment / Plan / Recommendation Clinical Impression   This 79 y.o. Male with h/o COPD and A-fib admitted with 3 wk h/o progressive SOB.  He was found to be in Afib with RVR.  Pt demonstrates generalized weakness and will benefit from OT to maximize safety and independence with BADLs and to facillitate transfer to home.    OT Assessment  Patient needs continued OT Services    Follow Up Recommendations  No OT follow up;Supervision - Intermittent    Barriers to Discharge      Equipment Recommendations  None recommended by OT    Recommendations for Other Services    Frequency  Min 2X/week    Precautions / Restrictions Precautions Precautions: Fall Restrictions Weight Bearing Restrictions: No       ADL  Eating/Feeding: Independent Where Assessed - Eating/Feeding: Chair Grooming: Wash/dry hands;Wash/dry face;Teeth care;Supervision/safety Where Assessed - Grooming: Unsupported standing Upper Body Bathing: Set up Where Assessed - Upper Body Bathing: Unsupported sitting Lower Body Bathing: Supervision/safety Where Assessed - Lower Body Bathing: Unsupported sit to stand Upper Body Dressing: Set up Where Assessed - Upper Body Dressing: Unsupported sitting Lower Body Dressing: Supervision/safety Where Assessed - Lower Body Dressing: Unsupported sit to stand Toilet Transfer: Supervision/safety Toilet Transfer Method: Sit to Barista: Regular height toilet Toileting - Clothing Manipulation and Hygiene: Supervision/safety Where Assessed - Engineer, mining and Hygiene: Standing Tub/Shower Transfer: Supervision/safety Tub/Shower Transfer Method: Science writer: Walk in shower Transfers/Ambulation Related to ADLs: Supervision pushing IV pole  DOE 3/4 ADL Comments: Pt.  fatigues quickly.  He reports this is his baseline and has had to give up a lot of activities due to this.  He reports he does fatigue with BADLs and had to pace himself and take rest breaks.    OT Diagnosis: Generalized weakness  OT Problem List: Decreased strength;Decreased activity tolerance;Decreased knowledge of use of DME or AE OT Treatment Interventions: Self-care/ADL training;Energy conservation;DME and/or AE instruction;Patient/family education   OT Goals Acute Rehab OT Goals OT Goal Formulation: With patient Time For Goal Achievement: 04/19/13 Potential to Achieve Goals: Good ADL Goals Pt Will Perform Grooming: with modified independence;Standing at sink ADL Goal: Grooming - Progress: Goal set today Additional ADL Goal #1: Pt will verbalize understanding re: options for shower chair ADL Goal: Additional Goal #1 - Progress: Goal set today Additional ADL Goal #2: Pt will independently incorporate energy conservation techniques into daily tasks ADL Goal: Additional Goal #2 - Progress: Goal set today  Visit Information  Last OT Received On: 04/12/13 Assistance Needed: +1    Subjective Data  Subjective: "I am really limited by my breathing"  I've had to stop doing a lot of things" Patient Stated Goal: To go home   Prior Functioning     Home Living Lives With: Spouse Available Help at Discharge: Family;Available 24 hours/day Type of Home: House Home Access: Stairs to enter [employer] of Steps: 5-6 Entrance Stairs-Rails: Can reach both Home Layout: One level Bathroom Shower/Tub: Tub/shower unit;Walk-in shower Bathroom Toilet: Standard Bathroom Accessibility: Yes How Accessible: Accessible via walker Home Adaptive Equipment: Straight cane;Walker - rolling Prior Function Level of Independence: Independent Able to Take Stairs?: Yes Driving: Yes Vocation: Retired Musician: No difficulties Dominant Hand: Right          Vision/Perception     Copywriter, advertising Arousal/Alertness:  Awake/alert Behavior During Therapy: WFL for tasks assessed/performed Overall Cognitive Status: Within Functional Limits for tasks assessed    Extremity/Trunk Assessment Right Upper Extremity Assessment RUE ROM/Strength/Tone: Within functional levels RUE Sensation: WFL - Light Touch RUE Coordination: WFL - gross/fine motor Left Upper Extremity Assessment LUE ROM/Strength/Tone: Within functional levels LUE Sensation: WFL - Light Touch LUE Coordination: WFL - gross/fine motor Trunk Assessment Trunk Assessment: Normal     Mobility Bed Mobility Bed Mobility: Supine to Sit;Sitting - Scoot to Edge of Bed;Sit to Supine Supine to Sit: 7: Independent Sitting - Scoot to Edge of Bed: 7: Independent Sit to Supine: 7: Independent Transfers Transfers: Sit to Stand;Stand to Sit Sit to Stand: 5: Supervision;From bed;With upper extremity assist Stand to Sit: 5: Supervision;With upper extremity assist;To bed     Exercise     Balance     End of Session OT - End of Session Activity Tolerance: Patient limited by fatigue Patient left: in bed;with call bell/phone within reach;with family/visitor present Nurse Communication: Mobility status  GO     Nicky Milhouse, Ursula Alert M 04/12/2013, 5:01 PM

## 2013-04-12 NOTE — Progress Notes (Signed)
Subjective: Less abdominal pain and distention. Still having stool output; completed gallon of Golytely yesterday.  Objective: Vital signs in last 24 hours: Temp:  [95.6 F (35.3 C)-98.2 F (36.8 C)] 97.5 F (36.4 C) (04/21 0740) Pulse Rate:  [53-100] 100 (04/21 0740) Resp:  [16-20] 18 (04/21 0740) BP: (90-141)/(33-66) 141/66 mmHg (04/21 0740) SpO2:  [94 %-100 %] 96 % (04/21 0740) Weight:  [57.6 kg (126 lb 15.8 oz)] 57.6 kg (126 lb 15.8 oz) (04/21 0433) Weight change: 1.808 kg (3 lb 15.8 oz) Last BM Date: 04/11/13  PE: GEN:  NAD, chronically frail- and chronically ill-appearing but is in no acute distress ABD:  Less tender, less distended, active bowel sounds.  Lab Results: CBC    Component Value Date/Time   WBC 6.6 04/12/2013 0535   RBC 3.66* 04/12/2013 0535   HGB 10.6* 04/12/2013 0535   HCT 31.3* 04/12/2013 0535   PLT 179 04/12/2013 0535   MCV 85.5 04/12/2013 0535   MCH 29.0 04/12/2013 0535   MCHC 33.9 04/12/2013 0535   RDW 14.6 04/12/2013 0535   LYMPHSABS 0.9 03/11/2013 1714   MONOABS 0.7 03/11/2013 1714   EOSABS 0.5 03/11/2013 1714   BASOSABS 0.2* 03/11/2013 1714   Studies/Results: Abd xray:  Non-specific bowel gas pattern.  Assessment:  1.  Abdominal pain, improving with treatment of constipation. 2.  Constipation. 3.  Abnormal CT, ascending colon thickening worrisome for mass.  Plan:  1.  Golytely today; 1/2 gallon this afternoon (stop if stools clear, complete gallon if stools still haven't cleared). 2.  Colonoscopy tomorrow.   Freddy Jaksch 04/12/2013, 8:29 AM

## 2013-04-13 ENCOUNTER — Encounter (HOSPITAL_COMMUNITY): Payer: Self-pay | Admitting: Gastroenterology

## 2013-04-13 ENCOUNTER — Encounter (HOSPITAL_COMMUNITY)
Admission: EM | Disposition: A | Discharge status: Home / Self Care | Payer: Self-pay | Source: Home / Self Care | Attending: Internal Medicine

## 2013-04-13 ENCOUNTER — Ambulatory Visit: Payer: Medicare Other | Admitting: Internal Medicine

## 2013-04-13 DIAGNOSIS — I2510 Atherosclerotic heart disease of native coronary artery without angina pectoris: Secondary | ICD-10-CM

## 2013-04-13 DIAGNOSIS — J449 Chronic obstructive pulmonary disease, unspecified: Secondary | ICD-10-CM

## 2013-04-13 HISTORY — PX: COLONOSCOPY: SHX5424

## 2013-04-13 LAB — BASIC METABOLIC PANEL
BUN: 14 mg/dL (ref 6–23)
Calcium: 9.3 mg/dL (ref 8.4–10.5)
Creatinine, Ser: 0.9 mg/dL (ref 0.50–1.35)
GFR calc Af Amer: 89 mL/min — ABNORMAL LOW (ref >90)
GFR calc non Af Amer: 77 mL/min — ABNORMAL LOW (ref >90)
Glucose, Bld: 81 mg/dL (ref 70–99)
Potassium: 4.1 mEq/L (ref 3.5–5.1)

## 2013-04-13 LAB — MAGNESIUM: Magnesium: 2 mg/dL (ref 1.5–2.5)

## 2013-04-13 LAB — CBC
HCT: 34.4 % — ABNORMAL LOW (ref 39.0–52.0)
Hemoglobin: 11.5 g/dL — ABNORMAL LOW (ref 13.0–17.0)
MCHC: 33.4 g/dL (ref 30.0–36.0)

## 2013-04-13 SURGERY — COLONOSCOPY
Anesthesia: Moderate Sedation | Laterality: Left

## 2013-04-13 MED ORDER — FENTANYL CITRATE 0.05 MG/ML IJ SOLN
INTRAMUSCULAR | Status: AC
  Filled 2013-04-13: qty 2

## 2013-04-13 MED ORDER — FENTANYL CITRATE 0.05 MG/ML IJ SOLN
INTRAMUSCULAR | Status: DC | PRN
  Administered 2013-04-13 (×2): 25 ug via INTRAVENOUS

## 2013-04-13 MED ORDER — MIDAZOLAM HCL 5 MG/ML IJ SOLN
INTRAMUSCULAR | Status: AC
  Filled 2013-04-13: qty 2

## 2013-04-13 MED ORDER — MIDAZOLAM HCL 5 MG/5ML IJ SOLN
INTRAMUSCULAR | Status: DC | PRN
  Administered 2013-04-13: 1 mg via INTRAVENOUS
  Administered 2013-04-13: 2 mg via INTRAVENOUS
  Administered 2013-04-13: 1 mg via INTRAVENOUS

## 2013-04-13 MED ORDER — VITAMIN K1 10 MG/ML IJ SOLN
5.0000 mg | Freq: Once | INTRAVENOUS | Status: DC
  Filled 2013-04-13: qty 0.5

## 2013-04-13 MED ORDER — HEPARIN (PORCINE) IN NACL 100-0.45 UNIT/ML-% IJ SOLN
1100.0000 [IU]/h | INTRAMUSCULAR | Status: DC
  Administered 2013-04-13: 1000 [IU]/h via INTRAVENOUS
  Administered 2013-04-14 – 2013-04-15 (×2): 1150 [IU]/h via INTRAVENOUS
  Filled 2013-04-13 (×5): qty 250

## 2013-04-13 NOTE — Progress Notes (Signed)
Pt had 8 beat run of VT. VSS. Asymptomatic. Will continue to monitor closely.

## 2013-04-13 NOTE — Interval H&P Note (Signed)
History and Physical Interval Note:  04/13/2013 10:09 AM  [name]  has presented today for surgery, with the diagnosis of Constipation, abdominal pain, abnormal CT (ascending colon thickening)  The various methods of treatment have been discussed with the patient and family. After consideration of risks, benefits and other options for treatment, the patient has consented to  Procedure(s): COLONOSCOPY (Left) as a surgical intervention .  The patient's history has been reviewed, patient examined, no change in status, stable for surgery.  I have reviewed the patient's chart and labs.  Questions were answered to the patient's satisfaction.     [name] M  Assessment:  1.  Constipation, improved. 2.  Abdominal pain, improved. 3.  Abnormal CT abdomen, ascending colon thickening. 4.  Multiple other medical problems.  Plan:  1.  Colonoscopy today. 2.  Risks (bleeding, infection, bowel perforation that could require surgery, sedation-related changes in cardiopulmonary systems), benefits (identification and possible treatment of source of symptoms, exclusion of certain causes of symptoms), and alternatives (watchful waiting, radiographic imaging studies, empiric medical treatment) of colonoscopy were explained to patient in detail and patient wishes to proceed.

## 2013-04-13 NOTE — Progress Notes (Signed)
Subjective:  C/o worsening dyspnea.  For colonoscopy this am.  He will need to resume his oral diltiazem after the procedure and also rsume lasix.  Objective:  Vital Signs in the last 24 hours: BP 145/70  Pulse 86  Temp(Src) 97.5 F (36.4 C) (Oral)  Resp 17  Ht 5\' 7"  (1.702 m)  Wt 56.1 kg (123 lb 10.9 oz)  BMI 19.37 kg/m2  SpO2 95%  Physical Exam: Elderly WM in NAD Lungs:  Diminished BS Cardiac:  irregular rhythm, normal S1 and S2, no S3 Extremities:  No edema present  Intake/Output from previous day: 04/21 0701 - 04/22 0700 In: 2139.4 [P.O.:1660; I.V.:479.4] Out: 1003 [Urine:1000; Stool:3] Weight Filed Weights   04/11/13 0500 04/12/13 0433 04/13/13 0500  Weight: 55.792 kg (123 lb) 57.6 kg (126 lb 15.8 oz) 56.1 kg (123 lb 10.9 oz)    Lab Results: Basic Metabolic Panel:  Recent Labs  24/40/10 0535 04/13/13 0435  NA 137 137  K 3.5 4.1  CL 102 107  CO2 25 22  GLUCOSE 89 81  BUN 20 14  CREATININE 1.03 0.90    CBC:  Recent Labs  04/12/13 0535 04/13/13 0435  WBC 6.6 5.6  HGB 10.6* 11.5*  HCT 31.3* 34.4*  MCV 85.5 85.8  PLT 179 209   BNP    Component Value Date/Time   PROBNP 1755.0* 04/08/2013 0818   PROTIME: Lab Results  Component Value Date   INR 1.64* 04/13/2013   INR 1.65* 04/12/2013   INR 1.64* 04/12/2013   Telemetry: Atrial fib currently with controlled response Assessment/Plan:  1. A fib better controlled on IV cardiazem and dig 2. Diastolic CHF 3. Abdominal pain ? Cause currently abnormal CT scan for colonoscopy today  Rec:  Restart Lasix after coloscopy and place back on warfarin and oral diltiazem.   Darden Palmer  MD [facility]  04/13/2013, 9:16 AM

## 2013-04-13 NOTE — Progress Notes (Signed)
Pt transferred down to endoscopy on monitor by RN & endoscopy tech; report given off to endoscopy RN; emotional support given

## 2013-04-13 NOTE — Progress Notes (Addendum)
ANTICOAGULATION CONSULT NOTE - Follow Up Consult  Pharmacy Consult for heparin Indication: atrial fibrillation  Allergies  Allergen Reactions  . Atorvastatin Other (See Comments)    REACTION: severe muscle aches  . Ezetimibe-Simvastatin Other (See Comments)    REACTION: severe muscle aches  . Fenofibrate     REACTION: "ran me up a wall"  . Penicillins Other (See Comments)    blisters    Patient Measurements: Height: 5\' 7"  (170.2 cm) Weight: 126 lb 15.8 oz (57.6 kg) IBW/kg (Calculated) : 66.1 Heparin dosing wt: 57kg  Vital Signs: Temp: 96.7 F (35.9 C) (04/22 0334) Temp src: Oral (04/22 0334) BP: 129/53 mmHg (04/22 0000)  Labs:  Recent Labs  04/10/13 0520 04/11/13 0440  04/12/13 0535 04/12/13 2016 04/13/13 0435  HGB 14.3 11.6*  --  10.6*  --  11.5*  HCT 41.8 33.3*  --  31.3*  --  34.4*  PLT 261 222  --  179  --  209  LABPROT 24.2* 30.6*  < > 18.9* 19.0* 18.9*  INR 2.29* 3.14*  < > 1.64* 1.65* 1.64*  HEPARINUNFRC  --   --   --   --  <0.10* 0.21*  CREATININE 1.31 1.20  --  1.03  --   --   < > = values in this interval not displayed.  Estimated Creatinine Clearance: 45 ml/min (by C-G formula based on Cr of 1.03).   Medications:  Scheduled:  . amitriptyline  25 mg Oral QHS  . carvedilol  3.125 mg Oral BID WC  . [COMPLETED] digoxin  0.25 mg Oral Daily  . feeding supplement  237 mL Oral BID BM  . fluticasone  2 spray Each Nare Daily  . furosemide  40 mg Oral Daily  . levalbuterol  0.63 mg Nebulization TID  . levothyroxine  25 mcg Oral QAC breakfast  . [COMPLETED] polyethylene glycol-electrolytes  4,000 mL Oral Once  . potassium chloride SA  40 mEq Oral BID  . tamsulosin  0.4 mg Oral Daily  . tiotropium  18 mcg Inhalation Daily  . [DISCONTINUED] levalbuterol  0.63 mg Nebulization Q6H  . [DISCONTINUED] potassium chloride SA  20 mEq Oral Daily    Assessment: 79 y/o male patient  on chronic Coumadin for Afib. INR is now subtherapeutic (1.6). Home regimen (5mg   daily except 2.5mg  on Tuesdays and Thursdays) - will continue to hold Coumadin and  heparin started now that INR < 2 with plans for colonoscopy 4/22. Heparin level (0.21) is below-goal on 900 units/hr. No problem with line / infusion and no bleeding per RN.   Goal of Therapy:  Heparin level goal 0.3-0.7  Plan:  1. Increase IV heparin to 1000 units/hr.  2. Heparin level in 8 hours.   Lorre Munroe, PharmD  04/13/2013 5:13 AM  Addendum:  Colonoscopy performed this morning. Awaiting bx results. Orders to restart heparin this afternoon. Rate changed this am prior to procedure, will restart at this rate and check level tonight.  Sheppard Coil PharmD., BCPS Clinical Pharmacist Pager [phone] 04/13/2013 12:48 PM

## 2013-04-13 NOTE — Progress Notes (Signed)
[facility] Progress Note  TEAM 1 - Stepdown/ICU TEAM   [name] ZOX:[id number] DOB: 20-Jan-1928 DOA: 04/08/2013 PCP: Tillman Abide, MD  Brief narrative: 79 year old male patient with known history of chronic atrial fibrillation as well as diastolic heart failure and prior pleural effusions. He also has COPD. Presented to the emergency department because of progressive shortness of breath duration 3 weeks with worse dyspnea on date of admission. No other symptoms noted. In the ER he was found to be in atrial fibrillation with rapid ventricular rates up to the 140s. He was started on a Cardizem drip. Was felt he had associated heart failure so he was given a dose of Lasix. After initiation of these treatments patient endorsed improvement in respiratory symptoms.  Assessment/Plan:  Atrial fibrillation with RVR Has proven to be quite challenging to control - CCB gtt stopped last night due to drops in BP and HR - this morning, gtt had to be resumed due to progressive tachycardia - keep in SDU for close monitoring as GI prep and colo likely to aggravate rate control further - Cardiology following and directing meds.We'll try to titrate off Cardizem drip tomorrow post colonoscopy.   Acute respiratory failure with hypoxia / acute on chronic diastolic heart failure Resolved w/ diuresis and control of afib/RVR - holding further diuresis as pt limited to liquid intake and experiencing GI losses w/ colon prep - Is/Os however note signif + balance last 24hrs and weight up (55.7 > 57.6kg) - continue oral Lasix, appears to be compensated, cardiology following.   Abdominal pain with ?mass on CT  apple core lesion within the ascending colon just above the cecum noted on CT abdom - He is due for colonoscopy by [facility] today,Follow biopsy results. Resume heparin 4hrs after procedure.   Chronic kideny disease stage III  (GFR 30-59 ml/min) GFR is holding steady    CAD enzymes normal  despite RVR - no CP   COPD compensated   Hypothyroidism Synthroid Continued at home dose   Long-term (current) use of anticoagulants see discussion above    DVT prophylaxis: Warfarin >> heparin Code Status: Full Family Communication: Patient Disposition Plan: Stepdown Isolation: None  Consultants: Cardiology [facility]  Procedures: None  Antibiotics: None  HPI/Subjective: The pt reports some DOE this morning.  He denies cp, n/v, or abdom pain.  He is thus far tolerating his colon prep w/o major issue.    Objective: Blood pressure 124/51, pulse 83, temperature 97.8 F (36.6 C), temperature source Oral, resp. rate 18, height 5\' 7"  (1.702 m), weight 56.1 kg (123 lb 10.9 oz), SpO2 99.00%.  Intake/Output Summary (Last 24 hours) at 04/13/13 1219 Last data filed at 04/13/13 0900  Gross per 24 hour  Intake 1544.42 ml  Output    753 ml  Net 791.42 ml   Exam: General: No acute respiratory distress  Lungs: Clear to auscultation throughout with no wheezes or crackles Cardiovascular: Regular rate without murmur gallop or rub - no peripheral edema or JVD Abdomen: Soft, bowel sounds present, no organomegaly, no rebound, no ascites Musculoskeletal: No significant cyanosis, clubbing of bilateral lower extremities  Data Reviewed: Basic Metabolic Panel:  Recent Labs Lab 04/09/13 1115 04/10/13 0520 04/11/13 0440 04/12/13 0535 04/13/13 0435  NA 136 138 133* 137 137  K 4.0 3.5 3.8 3.5 4.1  CL 102 99 98 102 107  CO2 25 24 24 25 22   GLUCOSE 92 120* 105* 89 81  BUN 23 29* 27* 20 14  CREATININE 1.30  1.31 1.20 1.03 0.90  CALCIUM 9.4 9.8 9.2 9.3 9.3  MG  --   --   --   --  2.0   CBC:  Recent Labs Lab 04/08/13 0709 04/10/13 0520 04/11/13 0440 04/12/13 0535 04/13/13 0435  WBC 6.9 17.0* 10.7* 6.6 5.6  HGB 12.7* 14.3 11.6* 10.6* 11.5*  HCT 37.4* 41.8 33.3* 31.3* 34.4*  MCV 86.2 85.3 83.9 85.5 85.8  PLT 228 261 222 179 209   Cardiac Enzymes:  Recent Labs Lab  04/08/13 0709 04/08/13 1245 04/08/13 1731 04/09/13 0027  TROPONINI <0.30 <0.30 <0.30 <0.30   BNP (last 3 results)  Recent Labs  02/21/13 1738 02/22/13 1610 04/08/13 0818  PROBNP 890.8* 1108.0* 1755.0*     Recent Results (from the past 240 hour(s))  MRSA PCR SCREENING     Status: None   Collection Time    04/08/13 12:02 PM      Result Value Range Status   MRSA by PCR NEGATIVE  NEGATIVE Final   Comment:            The GeneXpert MRSA Assay (FDA     approved for NASAL specimens     only), is one component of a     comprehensive MRSA colonization     surveillance program. It is not     intended to diagnose MRSA     infection nor to guide or     monitor treatment for     MRSA infections.     Studies:  Recent x-ray studies have been reviewed in detail by the Attending Physician  Scheduled Meds:  Reviewed in detail by the Attending Physician  Leroy Sea, MD [facility] Office  [phone] Pager [phone]  On-Call/Text Page:      [email or web address]      password [email or web address]  If 7PM-7AM, please contact night-coverage [email or web address] Password [facility] 04/13/2013, 12:19 PM   LOS: 5 days

## 2013-04-13 NOTE — H&P (View-Only) (Signed)
[facility] Progress Note [place] TEAM 1 - Stepdown/ICU TEAM   Edword Cu ZOX:[id number] DOB: 1928/09/05 DOA: 04/08/2013 PCP: Tillman Abide, MD  Brief narrative: 79 year old male patient with known history of chronic atrial fibrillation as well as diastolic heart failure and prior pleural effusions. He also has COPD. Presented to the emergency department because of progressive shortness of breath duration 3 weeks with worse dyspnea on date of admission. No other symptoms noted. In the ER he was found to be in atrial fibrillation with rapid ventricular rates up to the 140s. He was started on a Cardizem drip. Was felt he had associated heart failure so he was given a dose of Lasix. After initiation of these treatments patient endorsed improvement in respiratory symptoms.  Assessment/Plan:  Atrial fibrillation with RVR Has proven to be quite challenging to control - CCB gtt stopped last night due to drops in BP and HR - this morning, gtt had to be resumed due to progressive tachycardia - keep in SDU for close monitoring as GI prep and colo likely to aggravate rate control further - Cardiology following and directing meds  Acute respiratory failure with hypoxia / acute on chronic diastolic heart failure Resolved w/ diuresis and control of afib/RVR - holding further diuresis as pt limited to liquid intake and experiencing GI losses w/ colon prep - Is/Os however note signif + balance last 24hrs and weight up (55.7 > 57.6kg) - will resume lasix at half usual dose today   Abdominal pain with ?mass on CT  apple core lesion within the ascending colon just above the cecum noted on CT abdom - plan for colo Tues - stopped coumadin - heparin when INR below 2.0 in anticipation of colon bx - transfused FFP to allow colonoscopy - GI following - recheck INR this evening, and if INR not further improved, will give low dose vitamin K x1   Chronic kideny disease stage III  (GFR 30-59 ml/min) GFR is  holding steady   CAD enzymes normal despite RVR - no CP  COPD compensated  Hypothyroidism Synthroid continues   Long-term (current) use of anticoagulants see discussion above   DVT prophylaxis: Warfarin >> heparin Code Status: Full Family Communication: Patient Disposition Plan: Stepdown Isolation: None  Consultants: Cardiology [facility]  Procedures: None  Antibiotics: None  HPI/Subjective: The pt reports some DOE this morning.  He denies cp, n/v, or abdom pain.  He is thus far tolerating his colon prep w/o major issue.    Objective: Blood pressure 125/70, pulse 86, temperature 97.7 F (36.5 C), temperature source Oral, resp. rate 20, height 5\' 7"  (1.702 m), weight 57.6 kg (126 lb 15.8 oz), SpO2 95.00%.  Intake/Output Summary (Last 24 hours) at 04/12/13 1241 Last data filed at 04/12/13 1200  Gross per 24 hour  Intake 2656.67 ml  Output      0 ml  Net 2656.67 ml   Exam: General: No acute respiratory distress  Lungs: Clear to auscultation throughout with no wheezes or crackles Cardiovascular: Regular rate without murmur gallop or rub - no peripheral edema or JVD Abdomen: Soft, bowel sounds present, no organomegaly, no rebound, no ascites Musculoskeletal: No significant cyanosis, clubbing of bilateral lower extremities  Data Reviewed: Basic Metabolic Panel:  Recent Labs Lab 04/08/13 0709 04/09/13 1115 04/10/13 0520 04/11/13 0440 04/12/13 0535  NA 137 136 138 133* 137  K 4.8 4.0 3.5 3.8 3.5  CL 104 102 99 98 102  CO2 26 25 24 24 25   GLUCOSE 102* 92  120* 105* 89  BUN 18 23 29* 27* 20  CREATININE 1.07 1.30 1.31 1.20 1.03  CALCIUM 9.6 9.4 9.8 9.2 9.3   CBC:  Recent Labs Lab 04/08/13 0709 04/10/13 0520 04/11/13 0440 04/12/13 0535  WBC 6.9 17.0* 10.7* 6.6  HGB 12.7* 14.3 11.6* 10.6*  HCT 37.4* 41.8 33.3* 31.3*  MCV 86.2 85.3 83.9 85.5  PLT 228 261 222 179   Cardiac Enzymes:  Recent Labs Lab 04/08/13 0709 04/08/13 1245 04/08/13 1731  04/09/13 0027  TROPONINI <0.30 <0.30 <0.30 <0.30   BNP (last 3 results)  Recent Labs  02/21/13 1738 02/22/13 1610 04/08/13 0818  PROBNP 890.8* 1108.0* 1755.0*     Recent Results (from the past 240 hour(s))  MRSA PCR SCREENING     Status: None   Collection Time    04/08/13 12:02 PM      Result Value Range Status   MRSA by PCR NEGATIVE  NEGATIVE Final   Comment:            The GeneXpert MRSA Assay (FDA     approved for NASAL specimens     only), is one component of a     comprehensive MRSA colonization     surveillance program. It is not     intended to diagnose MRSA     infection nor to guide or     monitor treatment for     MRSA infections.     Studies:  Recent x-ray studies have been reviewed in detail by the Attending Physician  Scheduled Meds:  Reviewed in detail by the Attending Physician  Lonia Blood, MD [facility] Office  [phone] Pager [phone]  On-Call/Text Page:      [email or web address]      password [email or web address]  If 7PM-7AM, please contact night-coverage [email or web address] Password [facility] 04/12/2013, 12:41 PM   LOS: 4 days

## 2013-04-13 NOTE — Op Note (Signed)
[facility] [address] [place], [address]   COLONOSCOPY PROCEDURE REPORT  PATIENT: [name], [name]  MR#: [id number] BIRTHDATE: 1928-09-23 , 84  yrs. old GENDER: Male ENDOSCOPIST: Willis Modena, MD REFERRED MW:[facility] PROCEDURE DATE:  04/13/2013 PROCEDURE:   Colonoscopy with biopsy ASA CLASS:   Class III INDICATIONS:abdominal pain, constipation, abnormal CT scan. MEDICATIONS: Fentanyl 50 mcg IV and Versed 4 mg IV  DESCRIPTION OF PROCEDURE:   After the risks benefits and alternatives of the procedure were thoroughly explained, informed consent was obtained.  A digital rectal exam revealed no abnormalities of the rectum.   The Pentax Ped Colon [id number] endoscope was introduced through the anus and advanced to the cecum, which was identified by both the appendix and ileocecal valve. No adverse events experienced.   The quality of the prep was good.  The instrument was then slowly withdrawn as the colon was fully examined.   Findings:  Normal digital rectal exam.  Prep quality good.  At level of mid-ascending colon, a couple folds proximal to the ileocecal valve, there was a focal   2cm x 2cm segment of erythema and ulceration.  Appearance most typical of ischemic injury and not mass; biopsies were taken with cold forceps.  Remainder of exam normal.        Withdrawal time was about 10 minutes  .  The scope was withdrawn and the procedure completed.  ENDOSCOPIC IMPRESSION:     As above.  Area seen on CT appears most consistent with focal area of ischemic ulceration (whether from hypovolemia or from stercoral injury from his prior severe constipation), but mass not completely excluded.  RECOMMENDATIONS:     1.  Watch for potential complications of procedure. 2.  Await biopsy results. 3.  OK to restart heparin at 3 pm (4 hours post-procedure). 4.  [facility] inpatient team to follow.  eSigned:  Willis Modena, MD 04/13/2013 11:01  AM   cc:

## 2013-04-14 ENCOUNTER — Encounter (HOSPITAL_COMMUNITY): Payer: Self-pay | Admitting: Gastroenterology

## 2013-04-14 LAB — BASIC METABOLIC PANEL
BUN: 14 mg/dL (ref 6–23)
CO2: 22 mEq/L (ref 19–32)
Calcium: 9.5 mg/dL (ref 8.4–10.5)
Creatinine, Ser: 0.95 mg/dL (ref 0.50–1.35)
GFR calc non Af Amer: 75 mL/min — ABNORMAL LOW (ref >90)
Glucose, Bld: 80 mg/dL (ref 70–99)
Sodium: 136 mEq/L (ref 135–145)

## 2013-04-14 LAB — HEPARIN LEVEL (UNFRACTIONATED): Heparin Unfractionated: 0.41 IU/mL (ref 0.30–0.70)

## 2013-04-14 LAB — CBC
Hemoglobin: 11.5 g/dL — ABNORMAL LOW (ref 13.0–17.0)
MCH: 29.1 pg (ref 26.0–34.0)
RBC: 3.95 MIL/uL — ABNORMAL LOW (ref 4.22–5.81)

## 2013-04-14 LAB — PROTIME-INR: INR: 1.5 — ABNORMAL HIGH (ref 0.00–1.49)

## 2013-04-14 LAB — GLUCOSE, CAPILLARY: Glucose-Capillary: 74 mg/dL (ref 70–99)

## 2013-04-14 MED ORDER — FUROSEMIDE 40 MG PO TABS
40.0000 mg | ORAL_TABLET | Freq: Two times a day (BID) | ORAL | Status: DC
  Administered 2013-04-14 – 2013-04-16 (×4): 40 mg via ORAL
  Filled 2013-04-14 (×4): qty 1

## 2013-04-14 MED ORDER — WARFARIN - PHARMACIST DOSING INPATIENT: Freq: Every day | Status: DC

## 2013-04-14 MED ORDER — WARFARIN SODIUM 5 MG PO TABS
5.0000 mg | ORAL_TABLET | Freq: Once | ORAL | Status: AC
  Administered 2013-04-14: 5 mg via ORAL
  Filled 2013-04-14: qty 1

## 2013-04-14 MED ORDER — DILTIAZEM HCL 100 MG IV SOLR: 5.0000 mg/h | INTRAVENOUS | Status: DC

## 2013-04-14 MED ORDER — DILTIAZEM HCL ER COATED BEADS 240 MG PO CP24
240.0000 mg | ORAL_CAPSULE | Freq: Every day | ORAL | Status: DC
  Administered 2013-04-14: 240 mg via ORAL
  Filled 2013-04-14 (×2): qty 1

## 2013-04-14 NOTE — Progress Notes (Signed)
ANTICOAGULATION CONSULT NOTE - Follow Up Consult  Pharmacy Consult for heparin Indication: atrial fibrillation  Allergies  Allergen Reactions  . Atorvastatin Other (See Comments)    REACTION: severe muscle aches  . Ezetimibe-Simvastatin Other (See Comments)    REACTION: severe muscle aches  . Fenofibrate     REACTION: "ran me up a wall"  . Penicillins Other (See Comments)    blisters    Patient Measurements: Height: 5\' 7"  (170.2 cm) Weight: 123 lb 10.9 oz (56.1 kg) IBW/kg (Calculated) : 66.1 Heparin dosing wt: 57kg  Vital Signs: Temp: 97.4 F (36.3 C) (04/23 0000) Temp src: Oral (04/23 0000) BP: 125/70 mmHg (04/23 0000)  Labs:  Recent Labs  04/11/13 0440  04/12/13 0535 04/12/13 2016 04/13/13 0435 04/14/13 0010  HGB 11.6*  --  10.6*  --  11.5*  --   HCT 33.3*  --  31.3*  --  34.4*  --   PLT 222  --  179  --  209  --   LABPROT 30.6*  < > 18.9* 19.0* 18.9*  --   INR 3.14*  < > 1.64* 1.65* 1.64*  --   HEPARINUNFRC  --   --   --  <0.10* 0.21* 0.22*  CREATININE 1.20  --  1.03  --  0.90  --   < > = values in this interval not displayed.  Estimated Creatinine Clearance: 50.2 ml/min (by C-G formula based on Cr of 0.9).   Medications:  Scheduled:  . amitriptyline  25 mg Oral QHS  . carvedilol  3.125 mg Oral BID WC  . feeding supplement  237 mL Oral BID BM  . fluticasone  2 spray Each Nare Daily  . furosemide  40 mg Oral Daily  . levothyroxine  25 mcg Oral QAC breakfast  . [COMPLETED] potassium chloride SA  40 mEq Oral BID  . tamsulosin  0.4 mg Oral Daily  . tiotropium  18 mcg Inhalation Daily  . [DISCONTINUED] levalbuterol  0.63 mg Nebulization TID  . [DISCONTINUED] phytonadione (VITAMIN K) IV  5 mg Intravenous Once    Assessment: 79 y/o male patient  on chronic Coumadin>>heparin for Afib.  Heparin level (0.22) is below-goal despite previous rate increase to 1000 units/hr.   Goal of Therapy:  Heparin level goal 0.3-0.7  Plan:  1. Increase IV heparin to  1150 units/hr.  2. Heparin level in 8 hours.   Talbert Cage, PharmD  04/14/2013 1:26 AM

## 2013-04-14 NOTE — Progress Notes (Deleted)
Pt converted from NSR to Afib. VSS. HR low 100s to mid 120s. Pt asymptomatic. Will continue to monitor closely.  

## 2013-04-14 NOTE — Progress Notes (Signed)
ANTICOAGULATION CONSULT NOTE - Follow Up Consult  Pharmacy Consult for heparin>>warfarin  Indication: atrial fibrillation  Allergies  Allergen Reactions  . Atorvastatin Other (See Comments)    REACTION: severe muscle aches  . Ezetimibe-Simvastatin Other (See Comments)    REACTION: severe muscle aches  . Fenofibrate     REACTION: "ran me up a wall"  . Penicillins Other (See Comments)    blisters    Patient Measurements: Height: 5\' 7"  (170.2 cm) Weight: 123 lb 7.3 oz (56 kg) IBW/kg (Calculated) : 66.1 Heparin dosing wt: 57kg  Vital Signs: Temp: 97.6 F (36.4 C) (04/23 0810) Temp src: Oral (04/23 0810) BP: 126/54 mmHg (04/23 0810) Pulse Rate: 86 (04/23 0810)  Labs:  Recent Labs  04/12/13 0535  04/12/13 2016 04/13/13 0435 04/14/13 0010 04/14/13 0415 04/14/13 1003  HGB 10.6*  --   --  11.5*  --  11.5*  --   HCT 31.3*  --   --  34.4*  --  33.8*  --   PLT 179  --   --  209  --  232  --   LABPROT 18.9*  --  19.0* 18.9*  --  17.7*  --   INR 1.64*  --  1.65* 1.64*  --  1.50*  --   HEPARINUNFRC  --   < > <0.10* 0.21* 0.22*  --  0.41  CREATININE 1.03  --   --  0.90  --  0.95  --   < > = values in this interval not displayed.  Estimated Creatinine Clearance: 47.5 ml/min (by C-G formula based on Cr of 0.95).  Assessment: 79 y/o male patient  on chronic Coumadin>>heparin for Afib.  Heparin level now at goal this am (0.4) on rate of 1150units/hr. Orders to resume warfarin tonight. INR 1.5. Patient ordered vitamin k yesterday but patient was taken for colonoscopy prior to administration.  No issues noted overnight. CBC stable.  Goal of Therapy:  Heparin level goal 0.3-0.7 INR goal 2-3  Plan:  1. Continue IV heparin to 1150 units/hr.  2. Heparin level daily 3. Resume coumadin with 5mg  tonight 4. INR in am  Sheppard Coil PharmD., [facility] Clinical Pharmacist Pager [phone] 04/14/2013 11:57 AM

## 2013-04-14 NOTE — Progress Notes (Signed)
[facility] Progress Note [place] TEAM 1 - Stepdown/ICU TEAM   [name] ZOX:[id number] DOB: 12/20/1928 DOA: 04/08/2013 PCP: Tillman Abide, MD  Brief narrative: 79 year old male patient with known history of chronic atrial fibrillation as well as diastolic heart failure and prior pleural effusions. He also has COPD. Presented to the emergency department because of progressive shortness of breath duration 3 weeks with worse dyspnea on date of admission. No other symptoms noted. In the ER he was found to be in atrial fibrillation with rapid ventricular rates up to the 140s. He was started on a Cardizem drip. Was felt he had associated heart failure so he was given a dose of Lasix. After initiation of these treatments patient endorsed improvement in respiratory symptoms.  During his hospital stay the patient reported complaints of abdominal pain.  This prompted the ordering of a CT scan of the abdomen which ultimately revealed an apple core lesion within the ascending colon.  The patient was transitioned from oral Coumadin to IV heparin and underwent colonoscopy.  Fortunately the colonoscopy revealed findings more suggestive of ischemic colitis.  Biopsies were accomplished but are currently pending.  Assessment/Plan:  Atrial fibrillation with RVR Has proven to be quite challenging to control - has been off and on IV diltiazem throughout his stay - Cardiology following and directing meds - perhaps this patient to be transitioned to Xarelto at the time of his discharge (will leave this decision to his cardiologist)   Acute respiratory failure with hypoxia / acute on chronic diastolic heart failure Resolved w/ diuresis and control of afib/RVR - continue oral Lasix - appears to be compensated - cardiology following  Abdominal pain with ?mass on CT  apple core lesion within the ascending colon just above the cecum noted on CT abdom - colonoscopy by [facility] 4/22 did not reveal a significant  mass with findings more suggestive of possible ischemic colitis - biopsies are pending - see further recommendations per GI note  Chronic kideny disease stage III  (GFR 30-59 ml/min) GFR is holding steady/improved beyond reported baseline  CAD enzymes normal despite RVR - no CP  COPD compensated  Hypothyroidism Synthroid continued at home dose  Long-term (current) use of anticoagulants see discussion above   DVT prophylaxis: Warfarin >> heparin >> warfarin Code Status: Full Family Communication: Patient Disposition Plan: SDU Isolation: None  Consultants: Cardiology [facility]  Procedures: 4/22 - colonoscopy - Area seen on CT appears most consistent with focal area of ischemic ulceration - await biopsy results  Antibiotics: None  HPI/Subjective: The patient is in good spirits.  He has no complaints this morning.  He is anxious to go home.  Objective: Blood pressure 101/76, pulse 102, temperature 97.1 F (36.2 C), temperature source Oral, resp. rate 18, height 5\' 7"  (1.702 m), weight 56 kg (123 lb 7.3 oz), SpO2 96.00%.  Intake/Output Summary (Last 24 hours) at 04/14/13 1558 Last data filed at 04/14/13 1500  Gross per 24 hour  Intake 1102.17 ml  Output    600 ml  Net 502.17 ml   Exam: General: No acute respiratory distress  Lungs: Clear to auscultation throughout with no wheezes or crackles Cardiovascular: Regular rate without murmur gallop or rub - no peripheral edema - no JVD Abdomen: Soft, bowel sounds present, no organomegaly, no rebound, no ascites Musculoskeletal: No significant cyanosis, clubbing of bilateral lower extremities  Data Reviewed: Basic Metabolic Panel:  Recent Labs Lab 04/10/13 0520 04/11/13 0440 04/12/13 0535 04/13/13 0435 04/14/13 0415  NA 138  133* 137 137 136  K 3.5 3.8 3.5 4.1 4.5  CL 99 98 102 107 107  CO2 24 24 25 22 22   GLUCOSE 120* 105* 89 81 80  BUN 29* 27* 20 14 14   CREATININE 1.31 1.20 1.03 0.90 0.95  CALCIUM 9.8 9.2 9.3  9.3 9.5  MG  --   --   --  2.0  --    CBC:  Recent Labs Lab 04/10/13 0520 04/11/13 0440 04/12/13 0535 04/13/13 0435 04/14/13 0415  WBC 17.0* 10.7* 6.6 5.6 5.4  HGB 14.3 11.6* 10.6* 11.5* 11.5*  HCT 41.8 33.3* 31.3* 34.4* 33.8*  MCV 85.3 83.9 85.5 85.8 85.6  PLT 261 222 179 209 232   Cardiac Enzymes:  Recent Labs Lab 04/08/13 0709 04/08/13 1245 04/08/13 1731 04/09/13 0027  TROPONINI <0.30 <0.30 <0.30 <0.30   BNP (last 3 results)  Recent Labs  02/21/13 1738 02/22/13 1610 04/08/13 0818  PROBNP 890.8* 1108.0* 1755.0*     Recent Results (from the past 240 hour(s))  MRSA PCR SCREENING     Status: None   Collection Time    04/08/13 12:02 PM      Result Value Range Status   MRSA by PCR NEGATIVE  NEGATIVE Final   Comment:            The GeneXpert MRSA Assay (FDA     approved for NASAL specimens     only), is one component of a     comprehensive MRSA colonization     surveillance program. It is not     intended to diagnose MRSA     infection nor to guide or     monitor treatment for     MRSA infections.     Studies:  Recent x-ray studies have been reviewed in detail by the Attending Physician  Scheduled Meds:  Reviewed in detail by the Attending Physician  Lonia Blood, MD [facility] Office  [phone] Pager [phone]  On-Call/Text Page:      [email or web address]      password [email or web address]  If 7PM-7AM, please contact night-coverage [email or web address] Password [facility] 04/14/2013, 3:58 PM   LOS: 6 days

## 2013-04-14 NOTE — Progress Notes (Signed)
Subjective:  Feeling much better.  Had best night he has had since he has been in.  Not SOB.  Colonoscopy results noted.  Objective:  Vital Signs in the last 24 hours: BP 126/54  Pulse 86  Temp(Src) 97.6 F (36.4 C) (Oral)  Resp 18  Ht 5\' 7"  (1.702 m)  Wt 56 kg (123 lb 7.3 oz)  BMI 19.33 kg/m2  SpO2 96%  Physical Exam: Elderly WM in NAD Lungs:  Diminished BS Cardiac:  irregular rhythm, normal S1 and S2, no S3 Extremities:  No edema present  Intake/Output from previous day: 04/22 0701 - 04/23 0700 In: 543.3 [I.V.:543.3] Out: 350 [Urine:350] Weight Filed Weights   04/12/13 0433 04/13/13 0500 04/14/13 0500  Weight: 57.6 kg (126 lb 15.8 oz) 56.1 kg (123 lb 10.9 oz) 56 kg (123 lb 7.3 oz)    Lab Results: Basic Metabolic Panel:  Recent Labs  16/10/96 0435 04/14/13 0415  NA 137 136  K 4.1 4.5  CL 107 107  CO2 22 22  GLUCOSE 81 80  BUN 14 14  CREATININE 0.90 0.95    CBC:  Recent Labs  04/13/13 0435 04/14/13 0415  WBC 5.6 5.4  HGB 11.5* 11.5*  HCT 34.4* 33.8*  MCV 85.8 85.6  PLT 209 232   BNP    Component Value Date/Time   PROBNP 1755.0* 04/08/2013 0818   PROTIME: Lab Results  Component Value Date   INR 1.50* 04/14/2013   INR 1.64* 04/13/2013   INR 1.65* 04/12/2013   Telemetry: Atrial fib currently with controlled response  Assessment/Plan:  1. A fib better controlled on IV cardiazem and dig 2. Diastolic CHF 3. Abdominal pain ? Cause currently abnormal CT scan for colonoscopy today  Rec:  I would restart warfarin and put back on oral diltiazem.    Darden Palmer  MD [facility]  04/14/2013, 9:21 AM

## 2013-04-14 NOTE — Progress Notes (Signed)
Subjective: No problems post-colonoscopy.  Objective: Vital signs in last 24 hours: Temp:  [97.4 F (36.3 C)-98 F (36.7 C)] 97.6 F (36.4 C) (04/23 0810) Pulse Rate:  [83-86] 86 (04/23 0810) Resp:  [16-37] 18 (04/23 0400) BP: (112-145)/(45-126) 126/54 mmHg (04/23 0810) SpO2:  [92 %-100 %] 96 % (04/23 0810) Weight:  [56 kg (123 lb 7.3 oz)] 56 kg (123 lb 7.3 oz) (04/23 0500) Weight change: -0.1 kg (-3.5 oz) Last BM Date: 04/13/13  PE: GEN:  NAD, chronically ill-appearing ABD:  Soft, mild distended, hypoactive but present bowel sounds.  Lab Results: CBC    Component Value Date/Time   WBC 5.4 04/14/2013 0415   RBC 3.95* 04/14/2013 0415   HGB 11.5* 04/14/2013 0415   HCT 33.8* 04/14/2013 0415   PLT 232 04/14/2013 0415   MCV 85.6 04/14/2013 0415   MCH 29.1 04/14/2013 0415   MCHC 34.0 04/14/2013 0415   RDW 14.3 04/14/2013 0415   LYMPHSABS 0.9 03/11/2013 1714   MONOABS 0.7 03/11/2013 1714   EOSABS 0.5 03/11/2013 1714   BASOSABS 0.2* 03/11/2013 1714   Colon biopsies:  Ulcerated mucosa without evidence of malignancy; ischemic > infectious colitis leading considerations.  Assessment:  1.  Abdominal pain, constipation.  Improving. 2.  Abnormal CT abdomen. Lesion on CT appears most consistent with ischemic colitis.  Plan:  1.  Patient will need maintenance bowel regimen upon discharge, I would suggest Miralax 17 g once-a-day, titrated as needed with goal of at least one stool every other day, without straining or incomplete evacuation. 2.  OK to resume full anticoagulation, as felt clinically necessary. 3.  Will sign-off, and arrange outpatient follow-up.  Could consider repeat colonoscopy in 3-4 months to reinspect the ascending colon ulcerated area, but patient doesn't seem to keen on that right now.   Freddy Jaksch 04/14/2013, 9:27 AM

## 2013-04-15 ENCOUNTER — Ambulatory Visit: Payer: Medicare Other

## 2013-04-15 DIAGNOSIS — K559 Vascular disorder of intestine, unspecified: Secondary | ICD-10-CM | POA: Diagnosis present

## 2013-04-15 DIAGNOSIS — I5032 Chronic diastolic (congestive) heart failure: Secondary | ICD-10-CM

## 2013-04-15 LAB — HEPARIN LEVEL (UNFRACTIONATED): Heparin Unfractionated: 0.54 IU/mL (ref 0.30–0.70)

## 2013-04-15 LAB — BASIC METABOLIC PANEL
BUN: 15 mg/dL (ref 6–23)
Calcium: 10 mg/dL (ref 8.4–10.5)
Creatinine, Ser: 1.12 mg/dL (ref 0.50–1.35)
GFR calc Af Amer: 69 mL/min — ABNORMAL LOW (ref >90)
GFR calc non Af Amer: 59 mL/min — ABNORMAL LOW (ref >90)

## 2013-04-15 LAB — CBC
MCHC: 34.2 g/dL (ref 30.0–36.0)
Platelets: 285 10*3/uL (ref 150–400)
RDW: 14.8 % (ref 11.5–15.5)
WBC: 6.1 10*3/uL (ref 4.0–10.5)

## 2013-04-15 LAB — PROTIME-INR
INR: 1.21 (ref 0.00–1.49)
Prothrombin Time: 15.1 seconds (ref 11.6–15.2)

## 2013-04-15 MED ORDER — DILTIAZEM HCL ER COATED BEADS 300 MG PO CP24
300.0000 mg | ORAL_CAPSULE | Freq: Every day | ORAL | Status: DC
  Administered 2013-04-15 – 2013-04-16 (×2): 300 mg via ORAL
  Filled 2013-04-15 (×2): qty 1

## 2013-04-15 MED ORDER — WARFARIN SODIUM 7.5 MG PO TABS
7.5000 mg | ORAL_TABLET | Freq: Once | ORAL | Status: AC
  Administered 2013-04-15: 7.5 mg via ORAL
  Filled 2013-04-15: qty 1

## 2013-04-15 NOTE — Progress Notes (Signed)
Subjective:  Sitting up in chair.  Has walked in hall.  Not SOB, no chest pain.  A fib rate still up at times.  Warfarin subtherapeutic still.  Objective:  Vital Signs in the last 24 hours: BP 100/47  Pulse 84  Temp(Src) 97.8 F (36.6 C) (Oral)  Resp 18  Ht 5\' 7"  (1.702 m)  Wt 56 kg (123 lb 7.3 oz)  BMI 19.33 kg/m2  SpO2 96%  Physical Exam: Elderly WM in NAD Lungs:  Diminished BS Cardiac:  irregular rhythm, normal S1 and S2, no S3 Extremities:  No edema present  Intake/Output from previous day: 04/23 0701 - 04/24 0700 In: 1225.4 [P.O.:760; I.V.:465.4] Out: 1350 [Urine:1350] Weight Filed Weights   04/12/13 0433 04/13/13 0500 04/14/13 0500  Weight: 57.6 kg (126 lb 15.8 oz) 56.1 kg (123 lb 10.9 oz) 56 kg (123 lb 7.3 oz)    Lab Results: Basic Metabolic Panel:  Recent Labs  56/21/30 0415 04/15/13 0440  NA 136 135  K 4.5 3.9  CL 107 101  CO2 22 24  GLUCOSE 80 91  BUN 14 15  CREATININE 0.95 1.12    CBC:  Recent Labs  04/14/13 0415 04/15/13 0440  WBC 5.4 6.1  HGB 11.5* 13.1  HCT 33.8* 38.3*  MCV 85.6 85.1  PLT 232 285   BNP    Component Value Date/Time   PROBNP 1755.0* 04/08/2013 0818   PROTIME: Lab Results  Component Value Date   INR 1.21 04/15/2013   INR 1.50* 04/14/2013   INR 1.64* 04/13/2013   Telemetry: Atrial fib currently with controlled response but fast at times  Assessment/Plan:  1. A fib still some elevated rate at times 2. Diastolic CHF 3. Abdominal pain ? Cause currently abnormal CT scan for colonoscopy today  Rec:  Load with warfarin.  Increase oral diltiazem.  Move to regular bed.   Darden Palmer  MD [facility]  04/15/2013, 9:20 AM

## 2013-04-15 NOTE — Progress Notes (Signed)
NUTRITION FOLLOW UP  Intervention:   1. D/c Ensure, pt will not drink 2. Magic cup TID between meals, each supplement provides 290 kcal and 9 grams of protein.   Nutrition Dx:   Inadequate oral intake related to recent dietary changes as evidenced by weight loss  Goal:   PO intake to meet >/=90% estimated nutrition needs. Unmet   Monitor:   Supplement tolerance, po intake, weight trends, labs  Assessment:   Cards managing heart rate and diuresing.  Pt started to have abdominal pain, and CT showed mass. Started on clear liquids on 4/19. S/p colonoscopy on 4/22, findings consistent with ischemic colitis. Diet advanced back to heart healthy on 4/23.  Pt states his appetite is poor, does not like the Ensure and wishes the food had more salt. Pt is willing to try Magic Cup supplements.   Height: Ht Readings from Last 1 Encounters:  04/08/13 5\' 7"  (1.702 m)    Weight Status:   Wt Readings from Last 1 Encounters:  04/14/13 123 lb 7.3 oz (56 kg)  consistent with admission weight   Re-estimated needs:  Kcal: 1450-1650  Protein: 65-75 gm  Fluid: >/=1.2 L  Skin: intact   Diet Order: Cardiac   Intake/Output Summary (Last 24 hours) at 04/15/13 1131 Last data filed at 04/15/13 1000  Gross per 24 hour  Intake  914.5 ml  Output   1100 ml  Net -185.5 ml    Last BM: 4/22   Labs:   Recent Labs Lab 04/13/13 0435 04/14/13 0415 04/15/13 0440  NA 137 136 135  K 4.1 4.5 3.9  CL 107 107 101  CO2 22 22 24   BUN 14 14 15   CREATININE 0.90 0.95 1.12  CALCIUM 9.3 9.5 10.0  MG 2.0  --   --   GLUCOSE 81 80 91    CBG (last 3)   Recent Labs  04/14/13 1222  GLUCAP 74    Scheduled Meds: . amitriptyline  25 mg Oral QHS  . carvedilol  3.125 mg Oral BID WC  . diltiazem  300 mg Oral Daily  . feeding supplement  237 mL Oral BID BM  . fluticasone  2 spray Each Nare Daily  . furosemide  40 mg Oral BID  . levothyroxine  25 mcg Oral QAC breakfast  . tamsulosin  0.4 mg Oral  Daily  . tiotropium  18 mcg Inhalation Daily  . warfarin  7.5 mg Oral ONCE-1800  . Warfarin - Pharmacist Dosing Inpatient   Does not apply q1800    Continuous Infusions: . sodium chloride 10 mL/hr at 04/14/13 0604  . heparin 1,150 Units/hr (04/14/13 1525)    Clarene Duke RD, LDN Pager [phone] After Hours pager [phone]

## 2013-04-15 NOTE — Progress Notes (Signed)
[facility] Progress Note [place] TEAM 1 - Stepdown/ICU TEAM   [name] FAO:[id number] DOB: 05-24-1928 DOA: 04/08/2013 PCP: Tillman Abide, MD  Brief narrative: 79 year old male patient with known history of chronic atrial fibrillation as well as diastolic heart failure and prior pleural effusions. He also has COPD. Presented to the emergency department because of progressive shortness of breath duration 3 weeks with worse dyspnea on date of admission. No other symptoms noted. In the ER he was found to be in atrial fibrillation with rapid ventricular rates up to the 140s. He was started on a Cardizem drip. Was felt he had associated heart failure so he was given a dose of Lasix. After initiation of these treatments patient endorsed improvement in respiratory symptoms.  During his hospital stay the patient reported complaints of abdominal pain.  This prompted the ordering of a CT scan of the abdomen which ultimately revealed an apple core lesion within the ascending colon.  The patient was transitioned from oral Coumadin to IV heparin and underwent colonoscopy.  Fortunately the colonoscopy revealed findings more suggestive of ischemic colitis.  Biopsies were accomplished but are currently pending.  Assessment/Plan:  Atrial fibrillation with RVR Has proven to be quite challenging to control - has been off and on IV diltiazem throughout his stay - Cardiology following and directing meds - ok to transfer to telemetry today per Cardiology- cont Coumadin and Heparin per Cardiology  Acute respiratory failure with hypoxia / acute on chronic diastolic heart failure Resolved w/ diuresis and control of afib/RVR - continue oral Lasix - appears to be compensated - cardiology following  Abdominal pain with ?mass on CT  apple core lesion within the ascending colon just above the cecum noted on CT abdom - colonoscopy by [facility] 4/22 did not reveal a significant mass with findings more suggestive  of possible ischemic colitis - biopsies are consistent with ischemic vs infectious colitis- no further abd pain - see further recommendations per GI note  Chronic kideny disease stage III  (GFR 30-59 ml/min) GFR is holding steady/improved beyond reported baseline  CAD enzymes normal despite RVR - no CP  COPD compensated  Hypothyroidism Synthroid continued at home dose  Long-term (current) use of anticoagulants see discussion above   DVT prophylaxis: Warfarin >> heparin >> warfarin Code Status: Full Family Communication: Patient Disposition Plan: Telemetry and home once cleared by Cardiology Isolation: None  Consultants: Cardiology [facility]  Procedures: 4/22 - colonoscopy - Area seen on CT appears most consistent with focal area of ischemic ulceration - await biopsy results  Antibiotics: None  HPI/Subjective: The patient has no complaint of abd pain, dyspnea or palpitations- wanting to go home as soon as possible.   Objective: Blood pressure 100/47, pulse 84, temperature 97.8 F (36.6 C), temperature source Oral, resp. rate 18, height 5\' 7"  (1.702 m), weight 56 kg (123 lb 7.3 oz), SpO2 96.00%.  Intake/Output Summary (Last 24 hours) at 04/15/13 1232 Last data filed at 04/15/13 1000  Gross per 24 hour  Intake    893 ml  Output   1600 ml  Net   -707 ml   Exam: General: No acute respiratory distress  Lungs: Clear to auscultation throughout with no wheezes or crackles Cardiovascular: Irregular rate without murmur gallop or rub - no peripheral edema - no JVD Abdomen: Soft, bowel sounds present, no organomegaly, no rebound, no ascites Musculoskeletal: No significant cyanosis, clubbing of bilateral lower extremities  Data Reviewed: Basic Metabolic Panel:  Recent Labs Lab 04/11/13 0440  04/12/13 0535 04/13/13 0435 04/14/13 0415 04/15/13 0440  NA 133* 137 137 136 135  K 3.8 3.5 4.1 4.5 3.9  CL 98 102 107 107 101  CO2 24 25 22 22 24   GLUCOSE 105* 89 81 80 91   BUN 27* 20 14 14 15   CREATININE 1.20 1.03 0.90 0.95 1.12  CALCIUM 9.2 9.3 9.3 9.5 10.0  MG  --   --  2.0  --   --    CBC:  Recent Labs Lab 04/11/13 0440 04/12/13 0535 04/13/13 0435 04/14/13 0415 04/15/13 0440  WBC 10.7* 6.6 5.6 5.4 6.1  HGB 11.6* 10.6* 11.5* 11.5* 13.1  HCT 33.3* 31.3* 34.4* 33.8* 38.3*  MCV 83.9 85.5 85.8 85.6 85.1  PLT 222 179 209 232 285   Cardiac Enzymes:  Recent Labs Lab 04/08/13 1245 04/08/13 1731 04/09/13 0027  TROPONINI <0.30 <0.30 <0.30   BNP (last 3 results)  Recent Labs  02/21/13 1738 02/22/13 1610 04/08/13 0818  PROBNP 890.8* 1108.0* 1755.0*     Recent Results (from the past 240 hour(s))  MRSA PCR SCREENING     Status: None   Collection Time    04/08/13 12:02 PM      Result Value Range Status   MRSA by PCR NEGATIVE  NEGATIVE Final   Comment:            The GeneXpert MRSA Assay (FDA     approved for NASAL specimens     only), is one component of a     comprehensive MRSA colonization     surveillance program. It is not     intended to diagnose MRSA     infection nor to guide or     monitor treatment for     MRSA infections.     Studies:  Recent x-ray studies have been reviewed in detail by the Attending Physician  Scheduled Meds:  Reviewed in detail by the Attending Physician  Calvert Cantor, MD [facility] Office  [phone] Pager [phone]  On-Call/Text Page:      [email or web address]      password [email or web address]  If 7PM-7AM, please contact night-coverage [email or web address] Password [email or web address] 04/15/2013, 12:32 PM   LOS: 7 days

## 2013-04-15 NOTE — Progress Notes (Signed)
Physical Therapy Treatment Patient Details Name: [name] MRN: [id number] DOB: 1928/06/24 Today's Date: 04/15/2013 Time:  -     PT Assessment / Plan / Recommendation Comments on Treatment Session  Pt very eager to d/c home.  Pt able to increase ambulation distance & 02 sats remained WNL's throughout entire session.      Follow Up Recommendations  Home health PT;Supervision/Assistance - 24 hour     Does the patient have the potential to tolerate intense rehabilitation     Barriers to Discharge        Equipment Recommendations  None recommended by PT (pt has RW)    Recommendations for Other Services    Frequency Min 2X/week   Plan Discharge plan remains appropriate    Precautions / Restrictions Precautions Precautions: Fall Restrictions Weight Bearing Restrictions: No   Pertinent Vitals/Pain >95% RA entire session    Mobility  Bed Mobility Bed Mobility: Supine to Sit;Sitting - Scoot to Edge of Bed Supine to Sit: 7: Independent Sitting - Scoot to Edge of Bed: 7: Independent Transfers Transfers: Sit to Stand;Stand to Sit Sit to Stand: 6: Modified independent (Device/Increase time);With upper extremity assist;From bed;From chair/3-in-1;With armrests Stand to Sit: 6: Modified independent (Device/Increase time);With upper extremity assist;With armrests;To chair/3-in-1 Ambulation/Gait Ambulation/Gait Assistance: 4: Min guard Ambulation Distance (Feet): 400 Feet Assistive device: None Ambulation/Gait Assistance Details: Mild staggering but no physical (A) required.  Performed gait challenges with no difference in (A) required    Gait Pattern: Step-through pattern;Decreased stride length Stairs: No Wheelchair Mobility Wheelchair Mobility: No    Exercises General Exercises - Lower Extremity Long Arc Quad: Both;10 reps;Seated Hip Flexion/Marching: Both;10 reps;Seated Toe Raises: Both;10 reps;Seated Heel Raises: Both;10 reps;Seated    PT Goals Acute Rehab PT  Goals Time For Goal Achievement: 04/23/13 Potential to Achieve Goals: Good Pt will Ambulate: >150 feet;with modified independence;with least restrictive assistive device PT Goal: Ambulate - Progress: Progressing toward goal Pt will Go Up / Down Stairs: 6-9 stairs;with supervision;with rail(s) Pt will Perform Home Exercise Program: Independently PT Goal: Perform Home Exercise Program - Progress: Progressing toward goal  Visit Information  Last PT Received On: 04/15/13 Assistance Needed: +1    Subjective Data  Subjective: Pt received supine in bed; agreeable to participate in therapy.  Patient Stated Goal: home   Cognition  Cognition Arousal/Alertness: Awake/alert Behavior During Therapy: WFL for tasks assessed/performed Overall Cognitive Status: Within Functional Limits for tasks assessed    Balance  Balance Balance Assessed: Yes Static Standing Balance Static Standing - Balance Support: No upper extremity supported Static Standing - Level of Assistance: 5: Stand by assistance Static Standing - Comment/# of Minutes: Mild anterior/posterior sway with standing with eyes closed.   High Level Balance High Level Balance Activites: Turns;Direction changes;Sudden stops;Head turns  End of Session PT - End of Session Equipment Utilized During Treatment: Gait belt Activity Tolerance: Patient tolerated treatment well Patient left: in chair;with call bell/phone within reach Nurse Communication: Mobility status     Verdell Face, [place] [phone] 04/15/2013

## 2013-04-15 NOTE — Progress Notes (Signed)
ANTICOAGULATION CONSULT NOTE - Follow Up Consult  Pharmacy Consult for heparin>>warfarin  Indication: atrial fibrillation  Allergies  Allergen Reactions  . Atorvastatin Other (See Comments)    REACTION: severe muscle aches  . Ezetimibe-Simvastatin Other (See Comments)    REACTION: severe muscle aches  . Fenofibrate     REACTION: "ran me up a wall"  . Penicillins Other (See Comments)    blisters    Patient Measurements: Height: 5\' 7"  (170.2 cm) Weight: 123 lb 7.3 oz (56 kg) IBW/kg (Calculated) : 66.1 Heparin dosing wt: 57kg  Vital Signs: Temp: 97.8 F (36.6 C) (04/24 0800) Temp src: Oral (04/24 0800) BP: 100/47 mmHg (04/24 0800)  Labs:  Recent Labs  04/13/13 0435 04/14/13 0010 04/14/13 0415 04/14/13 1003 04/15/13 0440  HGB 11.5*  --  11.5*  --  13.1  HCT 34.4*  --  33.8*  --  38.3*  PLT 209  --  232  --  285  LABPROT 18.9*  --  17.7*  --  15.1  INR 1.64*  --  1.50*  --  1.21  HEPARINUNFRC 0.21* 0.22*  --  0.41 0.54  CREATININE 0.90  --  0.95  --  1.12    Estimated Creatinine Clearance: 40.3 ml/min (by C-G formula based on Cr of 1.12).  Assessment: 79 y/o male patient  on chronic Coumadin>>heparin for Afib.  Heparin level now at goal this am (0.5) on rate of 1150units/hr. Orders to resume warfarin. INR down today to 1.2 most likely from holding several doses prior to colonoscopy. No vitamin K was ever given.  No bleeding issues noted overnight. CBC stable.  Will give higher dose of warfarin for a couple of days until INR drifts up then resume home dose.  Goal of Therapy:  Heparin level goal 0.3-0.7 INR goal 2-3  Plan:  1. Continue IV heparin to 1150 units/hr.  2. Heparin level daily 3. Coumadin 7.5mg  tonight- 4. INR in am  Sheppard Coil PharmD., [facility] Clinical Pharmacist Pager [phone] 04/15/2013 8:27 AM

## 2013-04-15 NOTE — Progress Notes (Signed)
Dr. Donnie Aho notified in reference to patient HR dropping down in the 30s, Coreg held, no knew orders given at this time.

## 2013-04-16 ENCOUNTER — Other Ambulatory Visit: Payer: Self-pay | Admitting: *Deleted

## 2013-04-16 ENCOUNTER — Telehealth: Payer: Self-pay | Admitting: Internal Medicine

## 2013-04-16 DIAGNOSIS — L570 Actinic keratosis: Secondary | ICD-10-CM

## 2013-04-16 LAB — PROTIME-INR
INR: 1.29 (ref 0.00–1.49)
Prothrombin Time: 15.8 seconds — ABNORMAL HIGH (ref 11.6–15.2)

## 2013-04-16 LAB — CBC
HCT: 37.5 % — ABNORMAL LOW (ref 39.0–52.0)
MCHC: 33.9 g/dL (ref 30.0–36.0)
Platelets: 280 10*3/uL (ref 150–400)
RDW: 14.4 % (ref 11.5–15.5)

## 2013-04-16 LAB — BASIC METABOLIC PANEL
BUN: 22 mg/dL (ref 6–23)
Calcium: 9.9 mg/dL (ref 8.4–10.5)
Chloride: 101 mEq/L (ref 96–112)
Creatinine, Ser: 1.25 mg/dL (ref 0.50–1.35)
GFR calc Af Amer: 60 mL/min — ABNORMAL LOW (ref >90)
GFR calc non Af Amer: 51 mL/min — ABNORMAL LOW (ref >90)

## 2013-04-16 MED ORDER — FUROSEMIDE 40 MG PO TABS: 40.0000 mg | ORAL_TABLET | Freq: Two times a day (BID) | ORAL | Status: DC

## 2013-04-16 MED ORDER — POTASSIUM CHLORIDE CRYS ER 20 MEQ PO TBCR: 20.0000 meq | EXTENDED_RELEASE_TABLET | Freq: Every day | ORAL | Status: DC

## 2013-04-16 MED ORDER — WARFARIN SODIUM 7.5 MG PO TABS
7.5000 mg | ORAL_TABLET | Freq: Once | ORAL | Status: DC
  Filled 2013-04-16: qty 1

## 2013-04-16 MED ORDER — CARVEDILOL 3.125 MG PO TABS: 3.1250 mg | ORAL_TABLET | Freq: Two times a day (BID) | ORAL | Status: DC

## 2013-04-16 MED ORDER — DILTIAZEM HCL ER COATED BEADS 300 MG PO CP24: 300.0000 mg | ORAL_CAPSULE | Freq: Every day | ORAL | Status: DC

## 2013-04-16 NOTE — Discharge Summary (Signed)
Physician Discharge Summary  [name] ZOX:[id number] DOB: 1928-04-01 DOA: 04/08/2013  PCP: Tillman Abide, MD  Admit date: 04/08/2013 Discharge date: 04/16/2013  Time spent: 40 minutes  Recommendations for Outpatient Follow-up:  PT/INR at Dr. York Spaniel office   Discharge Diagnoses:   CAD (coronary artery disease)   Atrial fibrillation with RVR   COPD   Hypothyroidism   Acute respiratory failure with hypoxia   Chronic kideny disease stage III  (GFR 30-59 ml/min)   Long-term (current) use of anticoagulants   Chronic diastolic heart failure   Ischemic colitis   Discharge Condition: good  Diet recommendation: heart healthy   Filed Weights   04/14/13 0500 04/15/13 1551 04/16/13 0530  Weight: 56 kg (123 lb 7.3 oz) 53.5 kg (117 lb 15.1 oz) 53.071 kg (117 lb)    History of present illness:  [name] is a 79 y.o. male PMH significant for A fib, diastolic HF, CAD, pleural effusion, who presents to ED complaining of SOB that started 3 weeks prior to admission. He relates dyspnea worse today. He denies chest pain, palpitation. He was found to be in A fib with RVR Hr in the 140. He was started on Cardizem Gtt. He received lasix.  He is breathing better after treatments. He denies weight gain, no lower extremities swelling.      Hospital Course:  79 year old male patient with known history of chronic atrial fibrillation as well as diastolic heart failure and prior pleural effusions. He also has COPD. Presented to the emergency department because of progressive shortness of breath duration 3 weeks with worse dyspnea on date of admission. No other symptoms noted. In the ER he was found to be in atrial fibrillation with rapid ventricular rates up to the 140s. He was started on a Cardizem drip. Was felt he had associated heart failure so he was given a dose of Lasix. After initiation of these treatments patient endorsed improvement in respiratory symptoms.  During his hospital stay the  patient reported complaints of abdominal pain. This prompted the ordering of a CT scan of the abdomen which ultimately revealed an apple core lesion within the ascending colon. The patient was transitioned from oral Coumadin to IV heparin and underwent colonoscopy. Fortunately the colonoscopy revealed findings more suggestive of ischemic colitis. Biopsies were accomplished and show ischemic vs infectious colitis. .   Assessment/Plan:  Atrial fibrillation with RVR  Has proven to be quite challenging to control - has been off and on IV diltiazem throughout his stay - Cardiology followed along and directed meds -  Acute respiratory failure with hypoxia / acute on chronic diastolic heart failure  Resolved w/ diuresis and control of afib/RVR - placed on oral Lasix - appears to be compensated - prior to DC  Abdominal pain with ?mass on CT  apple core lesion within the ascending colon just above the cecum noted on CT abdom - colonoscopy by [facility] 4/22 did not reveal a significant mass with findings more suggestive of possible ischemic colitis - biopsies are consistent with ischemic vs infectious colitis- no further abd pain - see further recommendations per GI note  Chronic kideny disease stage III (GFR 30-59 ml/min)  GFR is holding steady/improved beyond reported baseline  CAD  enzymes normal despite RVR - no CP  COPD  compensated  Hypothyroidism  Synthroid continued at home dose  Long-term (current) use of anticoagulants  see discussion above    Procedures: Colonoscopy   Consultations:  [facility]  Dr. Donnie Aho   Discharge  Exam: Filed Vitals:   04/15/13 2147 04/16/13 0530 04/16/13 0825 04/16/13 0856  BP: 108/44 116/55 100/70   Pulse: 72 106 86   Temp: 97.2 F (36.2 C) 97.4 F (36.3 C)    TempSrc: Oral Oral    Resp: 18 16    Height:      Weight:  53.071 kg (117 lb)    SpO2: 97% 96%  98%    General: axox3 Cardiovascular: irreg irreg  Respiratory: ctab   Discharge  Instructions  Discharge Orders   Future Appointments Provider Department Dept Phone   08/03/2013 9:00 AM Karie Schwalbe, MD  [facility] [phone]   Future Orders Complete By Expires     Diet - low sodium heart healthy  As directed     Increase activity slowly  As directed         Medication List    TAKE these medications       albuterol 108 (90 BASE) MCG/ACT inhaler  Commonly known as:  PROVENTIL HFA  Inhale 2 puffs into the lungs every 4 (four) hours as needed for wheezing.     amitriptyline 25 MG tablet  Commonly known as:  ELAVIL  Take 25 mg by mouth at bedtime.     aspirin 81 MG tablet  Take 81 mg by mouth every morning.     carvedilol 3.125 MG tablet  Commonly known as:  COREG  Take 1 tablet (3.125 mg total) by mouth 2 (two) times daily with a meal.     diltiazem 300 MG 24 hr capsule  Commonly known as:  CARDIZEM CD  Take 1 capsule (300 mg total) by mouth daily.     fluticasone 50 MCG/ACT nasal spray  Commonly known as:  FLONASE  Place 2 sprays into the nose daily. 2 spray, Each Nare, Daily     furosemide 40 MG tablet  Commonly known as:  LASIX  Take 1 tablet (40 mg total) by mouth 2 (two) times daily.     HYDROcodone-acetaminophen 5-325 MG per tablet  Commonly known as:  NORCO/VICODIN  TAKE 1 TABLET BY MOUTH 3 TIMES A DAY AS NEEDED     levothyroxine 25 MCG tablet  Commonly known as:  SYNTHROID, LEVOTHROID  Take 25 mcg by mouth every morning.     nitroGLYCERIN 0.4 MG SL tablet  Commonly known as:  NITROSTAT  Place 0.4 mg under the tongue every 5 (five) minutes as needed for chest pain. x3 doses for for chest pain     potassium chloride SA 20 MEQ tablet  Commonly known as:  K-DUR,KLOR-CON  Take 1 tablet (20 mEq total) by mouth daily.     RAPAFLO 8 MG Caps capsule  Generic drug:  silodosin  Take 8 mg by mouth daily.     tiotropium 18 MCG inhalation capsule  Commonly known as:  SPIRIVA  Place 18 mcg into inhaler and inhale  daily.     warfarin 2.5 MG tablet  Commonly known as:  COUMADIN  Take 2.5 mg by mouth 2 (two) times a week. Takes on Tues and Thurs in the morning     warfarin 5 MG tablet  Commonly known as:  COUMADIN  Take 5 mg by mouth as directed. Take on Mon, Wed, Fri, Sat & Sun. Takes in the morning.           Follow-up Information   Schedule an appointment as soon as possible for a visit with Tillman Abide, MD.   Contact information:  [address] [place] [address] [phone]       Follow up with Darden Palmer, MD. Schedule an appointment as soon as possible for a visit on 04/19/2013. (INR check)    Contact information:   [address] [place] [address] [phone]        The results of significant diagnostics from this hospitalization (including imaging, microbiology, ancillary and laboratory) are listed below for reference.    Significant Diagnostic Studies: Dg Chest 2 View  04/08/2013  *RADIOLOGY REPORT*  Clinical Data: Extreme shortness of breath.  History of congestive heart failure.  CHEST - 2 VIEW  Comparison: 02/23/2013 radiographs.  CT 02/23/2013.  Findings: The heart size and mediastinal contours are stable.  Left greater than right pleural effusions have mildly enlarged.  Again demonstrated are diffuse emphysematous changes with associated scarring and architectural distortion.  Left apical density is unchanged.  Compared with the prior study, the interstitial prominence is slightly greater suggesting possible superimposed edema.  There is no focal airspace disease or pneumothorax.  IMPRESSION: Enlarging bilateral pleural effusions with increased interstitial prominence suggesting possible pulmonary edema and congestive heart failure.  Underlying severe emphysema.   Original Report Authenticated By: Carey Bullocks, M.D.    Ct Abdomen Pelvis W Contrast  04/10/2013  *RADIOLOGY REPORT*  Clinical Data: Lower abdominal pain.  CT ABDOMEN AND  PELVIS WITH CONTRAST  Technique:  Multidetector CT imaging of the abdomen and pelvis was performed following the standard protocol during bolus administration of intravenous contrast.  Contrast: 80mL OMNIPAQUE IOHEXOL 300 MG/ML  SOLN  Comparison: 08/07/2011  Findings: Bilateral pleural effusions, left larger than right. Bibasilar atelectasis.  Severe COPD changes noted in the lung bases.  Areas of scarring.  Heart is borderline in size.  Liver, gallbladder, spleen, pancreas, right adrenal are unremarkable.  Diffuse enlargement of the left adrenal gland compatible with hyperplasia.  Small bilateral renal cysts.  Area of cortical thinning and scarring.  Punctate nonobstructing stone in the upper pole of the left kidney.  Punctate nonobstructing stone in the mid and lower pole of the right kidney.  No hydronephrosis.  Large stool burden throughout the colon.  Small bowel is decompressed.  There is an area concerning for circumferential wall thickening within the ascending colon.  This is concerning for apple core lesion and colon cancer.  This is best seen on image 51 of series 2 and coronal image 23, series 400.  Urinary bladder and prostate grossly unremarkable.  No acute bony abnormality.  Degenerative changes in the lumbar spine.  IMPRESSION: Bilateral pleural effusions, left larger than right.  Bibasilar atelectasis.  COPD.  Findings concerning for apple core lesion within the ascending colon just above the cecum and colon cancer.  Recommend further evaluation with colonoscopy.  Large stool burden throughout the colon.   Original Report Authenticated By: Charlett Nose, M.D.    Dg Abd 2 Views  04/12/2013  *RADIOLOGY REPORT*  Clinical Data: Abdominal pain and constipation.  ABDOMEN - 2 VIEW  Comparison: Plain film of the abdomen and CT abdomen and pelvis 04/10/2013.  Findings: No free intraperitoneal air is identified.  The bowel gas pattern is nonobstructive.  The patient has small bilateral pleural effusions with  some basilar atelectasis.  IMPRESSION:  1.  Nonobstructive bowel gas pattern.  No free intraperitoneal air. 2.  Small bilateral pleural effusions and basilar atelectasis.   Original Report Authenticated By: Holley Dexter, M.D.    Dg Abd Portable 1v  04/10/2013  *  RADIOLOGY REPORT*  Clinical Data: Left lower quadrant abdominal pain  PORTABLE ABDOMEN - 1 VIEW  Comparison: Portable exam 0051 hours compared to 12/02/2012  Findings: Scattered gas and stool throughout nondistended colon. Small bowel gas pattern normal. No definite evidence of obstruction or wall thickening. Degenerative disc facet disease changes lower lumbar spine. Bones demineralized. Emphysematous changes at lung bases. No urinary tract calcification.  IMPRESSION: Nonspecific bowel gas pattern.   Original Report Authenticated By: Ulyses Southward, M.D.     Microbiology: Recent Results (from the past 240 hour(s))  MRSA PCR SCREENING     Status: None   Collection Time    04/08/13 12:02 PM      Result Value Range Status   MRSA by PCR NEGATIVE  NEGATIVE Final   Comment:            The GeneXpert MRSA Assay (FDA     approved for NASAL specimens     only), is one component of a     comprehensive MRSA colonization     surveillance program. It is not     intended to diagnose MRSA     infection nor to guide or     monitor treatment for     MRSA infections.     Labs: Basic Metabolic Panel:  Recent Labs Lab 04/12/13 0535 04/13/13 0435 04/14/13 0415 04/15/13 0440 04/16/13 0450  NA 137 137 136 135 136  K 3.5 4.1 4.5 3.9 3.6  CL 102 107 107 101 101  CO2 25 22 22 24 25   GLUCOSE 89 81 80 91 93  BUN 20 14 14 15 22   CREATININE 1.03 0.90 0.95 1.12 1.25  CALCIUM 9.3 9.3 9.5 10.0 9.9  MG  --  2.0  --   --   --    Liver Function Tests:  Recent Labs Lab 04/12/13 0535  AST 27  ALT 27  ALKPHOS 105  BILITOT 0.6  PROT 6.7  ALBUMIN 3.2*   No results found for this basename: LIPASE, AMYLASE,  in the last 168 hours No results  found for this basename: AMMONIA,  in the last 168 hours CBC:  Recent Labs Lab 04/12/13 0535 04/13/13 0435 04/14/13 0415 04/15/13 0440 04/16/13 0450  WBC 6.6 5.6 5.4 6.1 7.3  HGB 10.6* 11.5* 11.5* 13.1 12.7*  HCT 31.3* 34.4* 33.8* 38.3* 37.5*  MCV 85.5 85.8 85.6 85.1 84.8  PLT 179 209 232 285 280   Cardiac Enzymes: No results found for this basename: CKTOTAL, CKMB, CKMBINDEX, TROPONINI,  in the last 168 hours BNP: BNP (last 3 results)  Recent Labs  02/21/13 1738 02/22/13 1610 04/08/13 0818  PROBNP 890.8* 1108.0* 1755.0*   CBG:  Recent Labs Lab 04/14/13 1222  GLUCAP 74       Signed:  Estelita Iten  [facility] 04/16/2013, 11:28 AM

## 2013-04-16 NOTE — Progress Notes (Signed)
OT Cancellation Note  Patient Details Name: [name] MRN: [id number] DOB: June 03, 1928   Cancelled Treatment:    Reason Eval/Treat Not Completed: Other (comment) (pt in process of d/cing)  [name], [place] 04/16/2013, 1:28 PM

## 2013-04-16 NOTE — Telephone Encounter (Signed)
Called again to check on him in [place] to [phone]  Actually going home now Had further testing but still thinks the final conclusion was that this was mostly respiratory I checked---discharge summary not complete. I will review it when finished for Dr Ellin Saba impressions  Will plan early follow up in my office

## 2013-04-16 NOTE — Progress Notes (Signed)
[name] to be D/C'd Home per MD order.  Discussed with the patient and all questions fully answered.    Medication List    TAKE these medications       albuterol 108 (90 BASE) MCG/ACT inhaler  Commonly known as:  PROVENTIL HFA  Inhale 2 puffs into the lungs every 4 (four) hours as needed for wheezing.     amitriptyline 25 MG tablet  Commonly known as:  ELAVIL  Take 25 mg by mouth at bedtime.     aspirin 81 MG tablet  Take 81 mg by mouth every morning.     carvedilol 3.125 MG tablet  Commonly known as:  COREG  Take 1 tablet (3.125 mg total) by mouth 2 (two) times daily with a meal.     diltiazem 300 MG 24 hr capsule  Commonly known as:  CARDIZEM CD  Take 1 capsule (300 mg total) by mouth daily.     fluticasone 50 MCG/ACT nasal spray  Commonly known as:  FLONASE  Place 2 sprays into the nose daily. 2 spray, Each Nare, Daily     furosemide 40 MG tablet  Commonly known as:  LASIX  Take 1 tablet (40 mg total) by mouth 2 (two) times daily.     HYDROcodone-acetaminophen 5-325 MG per tablet  Commonly known as:  NORCO/VICODIN  TAKE 1 TABLET BY MOUTH 3 TIMES A DAY AS NEEDED     levothyroxine 25 MCG tablet  Commonly known as:  SYNTHROID, LEVOTHROID  Take 25 mcg by mouth every morning.     nitroGLYCERIN 0.4 MG SL tablet  Commonly known as:  NITROSTAT  Place 0.4 mg under the tongue every 5 (five) minutes as needed for chest pain. x3 doses for for chest pain     potassium chloride SA 20 MEQ tablet  Commonly known as:  K-DUR,KLOR-CON  Take 1 tablet (20 mEq total) by mouth daily.     RAPAFLO 8 MG Caps capsule  Generic drug:  silodosin  Take 8 mg by mouth daily.     tiotropium 18 MCG inhalation capsule  Commonly known as:  SPIRIVA  Place 18 mcg into inhaler and inhale daily.     warfarin 2.5 MG tablet  Commonly known as:  COUMADIN  Take 2.5 mg by mouth 2 (two) times a week. Takes on Tues and Thurs in the morning     warfarin 5 MG tablet  Commonly known as:   COUMADIN  Take 5 mg by mouth as directed. Take on Mon, Wed, Fri, Sat & Sun. Takes in the morning.        VVS, Skin clean, dry and intact without evidence of skin break down, no evidence of skin tears noted. IV catheter discontinued intact. Site without signs and symptoms of complications. Dressing and pressure applied.  An After Visit Summary was printed and given to the patient. Patient escorted via WC, and D/C home via private auto.  [name] 04/16/2013 1:07 PM

## 2013-04-16 NOTE — Progress Notes (Signed)
Patient evaluated for community based chronic disease management services with [facility] Care Management Program as a benefit of patient's [employer].  Spoke with patient at bedside to explain [facility] Care Management services. Left contact information and [facility] literature at bedside. Patient indicated that he and his wife manage his care.  He will have a home health nurse upon discharge and would like to discuss [facility] services with his PCP prior to engagement.  Made inpatient Case Manager aware of [facility] Care Management consult. Of note, [facility] Care Management services does not replace or interfere with any services that are arranged by inpatient case management or social work.  For additional questions or referrals please contact Anibal Henderson BSN RN [facility] Hospital Liaison at [phone].

## 2013-04-16 NOTE — Telephone Encounter (Signed)
Daughter calling asking for refills, pt was released from the hospital today and didn't have refills, per verbal from dr. Alphonsus Sias, refills sent to the pharmacy, left VM for daughter that refills where sent in.

## 2013-04-16 NOTE — Progress Notes (Signed)
ANTICOAGULATION CONSULT NOTE - Follow Up Consult   Pharmacy Consult :  Coumadin with Heparin bridging  Indication: atrial fibrillation  Dosing Weight: 57 kg  Labs:  Recent Labs  04/14/13 0415 04/14/13 1003 04/15/13 0440 04/16/13 0450  HGB 11.5*  --  13.1 12.7*  HCT 33.8*  --  38.3* 37.5*  PLT 232  --  285 280  INR 1.50*  --  1.21 1.29  HEPARINUNFRC  --  0.41 0.54 0.67  CREATININE 0.95  --  1.12 1.25   Lab Results  Component Value Date   INR 1.29 04/16/2013   INR 1.21 04/15/2013   INR 1.50* 04/14/2013   Estimated Creatinine Clearance: 33.6 ml/min (by C-G formula based on Cr of 1.25).  Medications:  Scheduled:  . amitriptyline  25 mg Oral QHS  . carvedilol  3.125 mg Oral BID WC  . diltiazem  300 mg Oral Daily  . fluticasone  2 spray Each Nare Daily  . furosemide  40 mg Oral BID  . levothyroxine  25 mcg Oral QAC breakfast  . tamsulosin  0.4 mg Oral Daily  . tiotropium  18 mcg Inhalation Daily  . [COMPLETED] warfarin  7.5 mg Oral ONCE-1800  . Warfarin - Pharmacist Dosing Inpatient   Does not apply q1800  . [DISCONTINUED] feeding supplement  237 mL Oral BID BM   Infusions:  . Heparin 1,150 Units/hr (04/15/13 1330)    Assessment:  79 y/o male on chronic Coumadin with Heparin bridging for atrial fibrillation.    INR trending up, 1.29.  Heparin level 0.67.  WBC/Hgb/Hct/Plts:  7.3/12.7/37.5/280 (04/25 0450).  No bleeding complications noted   Goal of Therapy:  INR 2-3  Heparin level 0.3-0.7 units/ml   Plan:   Reduce Heparin infusion to 1100 units/hr.  Will check Heparin level with AM Labs   Coumadin 7.5 mg tonight.  Daily INR's, CBC.  Monitor for bleeding complications    Laurena Bering, Pharm.D.  04/16/2013,10:57 AM

## 2013-04-16 NOTE — Progress Notes (Signed)
Subjective:  Feeling better today.  Not SOB.  No chest pain.  Appetite improving INR is still low.  [birth year].  Objective:  Vital Signs in the last 24 hours: BP 100/70  Pulse 86  Temp(Src) 97.4 F (36.3 C) (Oral)  Resp 16  Ht 5\' 7"  (1.702 m)  Wt 53.071 kg (117 lb)  BMI 18.32 kg/m2  SpO2 96%  Physical Exam: Elderly WM in NAD Lungs:  Diminished BS Cardiac:  irregular rhythm, normal S1 and S2, no S3 Extremities:  No edema present  Intake/Output from previous day: 04/24 0701 - 04/25 0700 In: 715.5 [P.O.:480; I.V.:195.5] Out: 2300 [Urine:2300] Weight Filed Weights   04/14/13 0500 04/15/13 1551 04/16/13 0530  Weight: 56 kg (123 lb 7.3 oz) 53.5 kg (117 lb 15.1 oz) 53.071 kg (117 lb)    Lab Results: Basic Metabolic Panel:  Recent Labs  40/98/11 0440 04/16/13 0450  NA 135 136  K 3.9 3.6  CL 101 101  CO2 24 25  GLUCOSE 91 93  BUN 15 22  CREATININE 1.12 1.25    CBC:  Recent Labs  04/15/13 0440 04/16/13 0450  WBC 6.1 7.3  HGB 13.1 12.7*  HCT 38.3* 37.5*  MCV 85.1 84.8  PLT 285 280   BNP    Component Value Date/Time   PROBNP 1755.0* 04/08/2013 0818   PROTIME: Lab Results  Component Value Date   INR 1.29 04/16/2013   INR 1.21 04/15/2013   INR 1.50* 04/14/2013   Telemetry: Atrial fib currently with controlled response but fast at times and occasionally will have   Assessment/Plan:  1. A fib still some elevated rate at times but also some intermittent slowing 2. Diastolic CHF improved   Rec:  His INR is increasing.  I think it would be fine to let go home even subtherapeutic as INR is increasing.  I believe his a fib rate is reasonable on his current regimen.  I need to see back in one week.  He will need to be sure to take furosemide at home.   Darden Palmer  MD [facility]  04/16/2013, 8:50 AM

## 2013-04-17 NOTE — Progress Notes (Signed)
Jenny Ivory Bail, OTA/L 

## 2013-04-20 ENCOUNTER — Other Ambulatory Visit: Payer: Self-pay | Admitting: Internal Medicine

## 2013-04-20 NOTE — Telephone Encounter (Signed)
Okay #90 x 0 

## 2013-04-20 NOTE — Telephone Encounter (Signed)
rx called into pharmacy

## 2013-04-22 ENCOUNTER — Encounter: Payer: Self-pay | Admitting: Internal Medicine

## 2013-04-22 ENCOUNTER — Ambulatory Visit (INDEPENDENT_AMBULATORY_CARE_PROVIDER_SITE_OTHER): Payer: Medicare Other | Admitting: General Practice

## 2013-04-22 ENCOUNTER — Ambulatory Visit (INDEPENDENT_AMBULATORY_CARE_PROVIDER_SITE_OTHER): Payer: Medicare Other | Admitting: Internal Medicine

## 2013-04-22 VITALS — BP 110/60 | HR 94 | Temp 98.4°F | Wt 122.0 lb

## 2013-04-22 DIAGNOSIS — I4891 Unspecified atrial fibrillation: Secondary | ICD-10-CM

## 2013-04-22 DIAGNOSIS — I5032 Chronic diastolic (congestive) heart failure: Secondary | ICD-10-CM

## 2013-04-22 DIAGNOSIS — Z5181 Encounter for therapeutic drug level monitoring: Secondary | ICD-10-CM

## 2013-04-22 DIAGNOSIS — Z7901 Long term (current) use of anticoagulants: Secondary | ICD-10-CM

## 2013-04-22 DIAGNOSIS — K559 Vascular disorder of intestine, unspecified: Secondary | ICD-10-CM

## 2013-04-22 LAB — POCT INR: INR: 3

## 2013-04-22 NOTE — Assessment & Plan Note (Signed)
Seems to have decompensated with rapid atrial fib Better after diuresis Now at baseline

## 2013-04-22 NOTE — Assessment & Plan Note (Signed)
Rate is now fine On coumadin still No symptoms to suggest paroxysms

## 2013-04-22 NOTE — Progress Notes (Signed)
Subjective:    Patient ID: [name], male    DOB: April 04, 1928, 79 y.o.   MRN: [id number]  HPI Here with wife  Reviewed recent hospitalization Admitted with dyspnea probably due to rapid atrial fib Diuresed and cardizem IV Rates and breathing improved Then abdominal pain Possible intestinal lesion---colonoscopy suggested ischemic colitis  Had been doing okay but today is a bad day Mixed up appointments ---went out this AM for appt---had time wrong Then went to Dr Donnie Aho but didn't have appt there either Feels tired and weak Has done some walking outside since discharge  No chest pain No palpitations Sleeps flat and no PND No edema  No stomach pain now Appetite is okay Bowels moving okay now on the miralax again No blood in stool  Current Outpatient Prescriptions on File Prior to Visit  Medication Sig Dispense Refill  . albuterol (PROVENTIL HFA) 108 (90 BASE) MCG/ACT inhaler Inhale 2 puffs into the lungs every 4 (four) hours as needed for wheezing.  6 each  2  . amitriptyline (ELAVIL) 25 MG tablet Take 25 mg by mouth at bedtime.      Marland Kitchen aspirin 81 MG tablet Take 81 mg by mouth every morning.       . carvedilol (COREG) 3.125 MG tablet Take 1 tablet (3.125 mg total) by mouth 2 (two) times daily with a meal.  60 tablet  11  . diltiazem (CARDIZEM CD) 300 MG 24 hr capsule Take 1 capsule (300 mg total) by mouth daily.  30 capsule  0  . fluticasone (FLONASE) 50 MCG/ACT nasal spray Place 2 sprays into the nose daily. 2 spray, Each Nare, Daily  16 g  0  . furosemide (LASIX) 40 MG tablet Take 1 tablet (40 mg total) by mouth 2 (two) times daily.  60 tablet  11  . HYDROcodone-acetaminophen (NORCO/VICODIN) 5-325 MG per tablet TAKE 1 TABLET THREE TIMES A DAY AS NEEDED  90 tablet  0  . levothyroxine (SYNTHROID, LEVOTHROID) 25 MCG tablet Take 25 mcg by mouth every morning.      . nitroGLYCERIN (NITROSTAT) 0.4 MG SL tablet Place 0.4 mg under the tongue every 5 (five) minutes as needed for  chest pain. x3 doses for for chest pain      . potassium chloride SA (K-DUR,KLOR-CON) 20 MEQ tablet Take 1 tablet (20 mEq total) by mouth daily.  30 tablet  11  . RAPAFLO 8 MG CAPS Take 8 mg by mouth daily.       Marland Kitchen tiotropium (SPIRIVA) 18 MCG inhalation capsule Place 18 mcg into inhaler and inhale daily.      Marland Kitchen warfarin (COUMADIN) 2.5 MG tablet Take 2.5 mg by mouth 2 (two) times a week. Takes on Tues and Thurs in the morning      . warfarin (COUMADIN) 5 MG tablet Take 5 mg by mouth as directed. Take on Mon, Wed, Fri, Sat & Sun. Takes in the morning.       No current facility-administered medications on file prior to visit.    Allergies  Allergen Reactions  . Atorvastatin Other (See Comments)    REACTION: severe muscle aches  . Ezetimibe-Simvastatin Other (See Comments)    REACTION: severe muscle aches  . Fenofibrate     REACTION: "ran me up a wall"  . Penicillins Other (See Comments)    blisters    Past Medical History  Diagnosis Date  . CAD (coronary artery disease)   . Hyperlipidemia   . Kidney stones   . BPH (  benign prostatic hypertrophy)   . Osteoarthritis   . COPD (chronic obstructive pulmonary disease)   . Hypothyroidism   . Atrial fibrillation   . Impaired fasting glucose   . Myocardial infarction   . Hypertension   . Shortness of breath   . CHF (congestive heart failure)   . GERD (gastroesophageal reflux disease)     Past Surgical History  Procedure Laterality Date  . Inguinal hernia repair  09/2004    left  . Back surgery  1990's    x3  . Transurethral resection of prostate  2002  . Pleural scarification  1980    right  . Esophagogastroduodenoscopy  04/2002    negative  . Bladder stone removal  06/2006  . Colonoscopy Left 04/13/2013    Procedure: COLONOSCOPY;  Surgeon: Willis Modena, MD;  Location: [facility] ENDOSCOPY;  Service: Endoscopy;  Laterality: Left;    Family History  Problem Relation Age of Onset  . Heart disease Mother   . Heart disease Brother    . Stroke Brother   . Cancer Neg Hx   . Diabetes Neg Hx   . Hypertension Neg Hx   . Heart disease Brother   . Stroke Brother   . Heart disease Brother   . Stroke Brother     History   Social History  . Marital Status: Married    Spouse Name: N/A    Number of Children: 1  . Years of Education: N/A   Occupational History  . retired Therapist, music    Social History Main Topics  . Smoking status: Former Smoker -- 1.00 packs/day for 60 years    Types: Cigarettes    Quit date: 12/24/2011  . Smokeless tobacco: Never Used  . Alcohol Use: No  . Drug Use: No  . Sexually Active: Not on file   Other Topics Concern  . Not on file   Social History Narrative   Does lots of yard work and a big garden   Has living will   No [place] health care POA. Asks for daughter Ardeen Fillers to make decisions if needed.   Discussed DNR--- he doesn't want artificial ventilation but isn't clear about this.   Not sure about tube feeds   Review of Systems Voiding okay Nocturia x 1-2 No falls or syncope    Objective:   Physical Exam  Constitutional: He appears well-developed and well-nourished. No distress.  Neck: Normal range of motion. Neck supple.  Cardiovascular: Normal rate.  Exam reveals no gallop.   No murmur heard. irregular  Pulmonary/Chest: Effort normal. No respiratory distress. He has no wheezes.  Slight bronchial sounds at bases and very slight basilar crackles on right No dullness  Abdominal: Soft. He exhibits no distension and no mass. There is no tenderness. There is no rebound and no guarding.  Musculoskeletal: He exhibits no edema and no tenderness.  Lymphadenopathy:    He has no cervical adenopathy.  Psychiatric: He has a normal mood and affect. His behavior is normal.          Assessment & Plan:

## 2013-04-22 NOTE — Assessment & Plan Note (Signed)
Resolved Probably had intestinal hypoperfusion during the rapid atrial fib Argues for now being overly aggressive lowering systolic BP

## 2013-04-22 NOTE — Patient Instructions (Signed)
Continue to take 5 mg daily , except 2.5 mg Tues/Thurs, check in 4 weeks 

## 2013-04-27 ENCOUNTER — Telehealth: Payer: Self-pay

## 2013-04-27 NOTE — Telephone Encounter (Signed)
Rosalita Chessman nurse with [facility] left v/m pt is one week being discharged from hospital; pt is due this week for recertification of home health. Rosalita Chessman request verbal order for one time a week for next 5 weeks.Please advise.

## 2013-04-28 NOTE — Telephone Encounter (Signed)
Spoke with nurse and advised results  

## 2013-04-28 NOTE — Telephone Encounter (Signed)
Okay to proceed as requested 

## 2013-05-14 ENCOUNTER — Other Ambulatory Visit: Payer: Self-pay | Admitting: Internal Medicine

## 2013-05-16 ENCOUNTER — Other Ambulatory Visit: Payer: Self-pay | Admitting: Internal Medicine

## 2013-05-18 ENCOUNTER — Other Ambulatory Visit: Payer: Self-pay | Admitting: Internal Medicine

## 2013-05-19 NOTE — Telephone Encounter (Signed)
rx called into pharmacy

## 2013-05-19 NOTE — Telephone Encounter (Signed)
Okay #90 x 0 

## 2013-05-20 ENCOUNTER — Ambulatory Visit: Payer: Medicare Other

## 2013-05-24 ENCOUNTER — Ambulatory Visit (INDEPENDENT_AMBULATORY_CARE_PROVIDER_SITE_OTHER): Payer: Medicare Other | Admitting: Family Medicine

## 2013-05-24 DIAGNOSIS — I4891 Unspecified atrial fibrillation: Secondary | ICD-10-CM

## 2013-05-24 DIAGNOSIS — Z7901 Long term (current) use of anticoagulants: Secondary | ICD-10-CM

## 2013-05-24 DIAGNOSIS — Z5181 Encounter for therapeutic drug level monitoring: Secondary | ICD-10-CM

## 2013-05-24 NOTE — Patient Instructions (Signed)
Continue to take 5 mg daily , except 2.5 mg Tues/Thurs, check in 6 weeks.

## 2013-05-31 ENCOUNTER — Encounter: Payer: Self-pay | Admitting: Radiology

## 2013-05-31 ENCOUNTER — Other Ambulatory Visit: Payer: Self-pay | Admitting: Family Medicine

## 2013-05-31 MED ORDER — WARFARIN SODIUM 5 MG PO TABS: ORAL_TABLET | ORAL | Status: DC

## 2013-06-01 ENCOUNTER — Ambulatory Visit (INDEPENDENT_AMBULATORY_CARE_PROVIDER_SITE_OTHER): Payer: Medicare Other | Admitting: Internal Medicine

## 2013-06-01 ENCOUNTER — Encounter: Payer: Self-pay | Admitting: Internal Medicine

## 2013-06-01 VITALS — BP 120/60 | HR 65 | Temp 97.6°F | Wt 122.0 lb

## 2013-06-01 DIAGNOSIS — J449 Chronic obstructive pulmonary disease, unspecified: Secondary | ICD-10-CM

## 2013-06-01 DIAGNOSIS — I4891 Unspecified atrial fibrillation: Secondary | ICD-10-CM

## 2013-06-01 DIAGNOSIS — I5032 Chronic diastolic (congestive) heart failure: Secondary | ICD-10-CM

## 2013-06-01 DIAGNOSIS — N400 Enlarged prostate without lower urinary tract symptoms: Secondary | ICD-10-CM

## 2013-06-01 DIAGNOSIS — M519 Unspecified thoracic, thoracolumbar and lumbosacral intervertebral disc disorder: Secondary | ICD-10-CM

## 2013-06-01 MED ORDER — HYDROCODONE-ACETAMINOPHEN 10-325 MG PO TABS: 1.0000 | ORAL_TABLET | Freq: Three times a day (TID) | ORAL | Status: DC | PRN

## 2013-06-01 NOTE — Assessment & Plan Note (Signed)
Regular again On coumadin still

## 2013-06-01 NOTE — Assessment & Plan Note (Signed)
Still gets DOE mostly in the morning Doing better now

## 2013-06-01 NOTE — Assessment & Plan Note (Signed)
Voids okay 

## 2013-06-01 NOTE — Assessment & Plan Note (Signed)
Mostly dyspneic in AM Gets better as day goes on (?after inhalers)

## 2013-06-01 NOTE — Assessment & Plan Note (Signed)
Back and hip pain is worse This is now his limiting factor Will increase the hydrocodone

## 2013-06-01 NOTE — Progress Notes (Signed)
Subjective:    Patient ID: [name], male    DOB: [birth year], 79 y.o.   MRN: [id number]  HPI "not doing no good" Bad hip and leg pain Hydrocodone not helping that much---taking tid in general Uses rub on heat balm--does help some  Breathing is tough in the morning-then better Dyspnea for first 2 hours of day--then settles in okay No chest pain No palpitations No edema  Urine flow is slow--especially in the morning--then improves Nocturia x 1-2  Current Outpatient Prescriptions on File Prior to Visit  Medication Sig Dispense Refill  . albuterol (PROVENTIL HFA) 108 (90 BASE) MCG/ACT inhaler Inhale 2 puffs into the lungs every 4 (four) hours as needed for wheezing.  6 each  2  . amitriptyline (ELAVIL) 25 MG tablet Take 25 mg by mouth at bedtime.      Marland Kitchen aspirin 81 MG tablet Take 81 mg by mouth every morning.       . carvedilol (COREG) 3.125 MG tablet TAKE 1 TABLET BY MOUTH TWICE A DAY WITH A MEAL  60 tablet  5  . diltiazem (CARDIZEM CD) 300 MG 24 hr capsule Take 1 capsule (300 mg total) by mouth daily.  30 capsule  0  . fluticasone (FLONASE) 50 MCG/ACT nasal spray Place 2 sprays into the nose daily. 2 spray, Each Nare, Daily  16 g  0  . furosemide (LASIX) 40 MG tablet Take 1 tablet (40 mg total) by mouth 2 (two) times daily.  60 tablet  11  . HYDROcodone-acetaminophen (NORCO/VICODIN) 5-325 MG per tablet TAKE 1 TABLET BY MOUTH 3 TIMES A DAY AS NEEDED  90 tablet  0  . levothyroxine (SYNTHROID, LEVOTHROID) 25 MCG tablet Take 25 mcg by mouth every morning.      . nitroGLYCERIN (NITROSTAT) 0.4 MG SL tablet Place 0.4 mg under the tongue every 5 (five) minutes as needed for chest pain. x3 doses for for chest pain      . potassium chloride SA (K-DUR,KLOR-CON) 20 MEQ tablet Take 1 tablet (20 mEq total) by mouth daily.  30 tablet  11  . RAPAFLO 8 MG CAPS Take 8 mg by mouth daily.       Marland Kitchen SPIRIVA HANDIHALER 18 MCG inhalation capsule PLACE 1 CAPSULE INTO INHALER AND INHALE ONCE DAILY.  30  capsule  9  . tiotropium (SPIRIVA) 18 MCG inhalation capsule Place 18 mcg into inhaler and inhale daily.      Marland Kitchen warfarin (COUMADIN) 2.5 MG tablet Take 2.5 mg by mouth 2 (two) times a week. Takes on Tues and Thurs in the morning      . warfarin (COUMADIN) 5 MG tablet Take as directed by [facility].  30 tablet  2   No current facility-administered medications on file prior to visit.    Allergies  Allergen Reactions  . Atorvastatin Other (See Comments)    REACTION: severe muscle aches  . Ezetimibe-Simvastatin Other (See Comments)    REACTION: severe muscle aches  . Fenofibrate     REACTION: "ran me up a wall"  . Penicillins Other (See Comments)    blisters    Past Medical History  Diagnosis Date  . CAD (coronary artery disease)   . Hyperlipidemia   . Kidney stones   . BPH (benign prostatic hypertrophy)   . Osteoarthritis   . COPD (chronic obstructive pulmonary disease)   . Hypothyroidism   . Atrial fibrillation   . Impaired fasting glucose   . Myocardial infarction   . Hypertension   .  Shortness of breath   . CHF (congestive heart failure)   . GERD (gastroesophageal reflux disease)     Past Surgical History  Procedure Laterality Date  . Inguinal hernia repair  09/2004    left  . Back surgery  1990's    x3  . Transurethral resection of prostate  2002  . Pleural scarification  1980    right  . Esophagogastroduodenoscopy  04/2002    negative  . Bladder stone removal  06/2006  . Colonoscopy Left 04/13/2013    Procedure: COLONOSCOPY;  Surgeon: Willis Modena, MD;  Location: [facility] ENDOSCOPY;  Service: Endoscopy;  Laterality: Left;    Family History  Problem Relation Age of Onset  . Heart disease Mother   . Heart disease Brother   . Stroke Brother   . Cancer Neg Hx   . Diabetes Neg Hx   . Hypertension Neg Hx   . Heart disease Brother   . Stroke Brother   . Heart disease Brother   . Stroke Brother     History   Social History  . Marital Status: Married     Spouse Name: N/A    Number of Children: 1  . Years of Education: N/A   Occupational History  . retired Therapist, music    Social History Main Topics  . Smoking status: Former Smoker -- 1.00 packs/day for 60 years    Types: Cigarettes    Quit date: 12/24/2011  . Smokeless tobacco: Never Used  . Alcohol Use: No  . Drug Use: No  . Sexually Active: Not on file   Other Topics Concern  . Not on file   Social History Narrative   Does lots of yard work and a big garden   Has living will   No [place] health care POA. Asks for daughter Ardeen Fillers to make decisions if needed.   Discussed DNR--- he doesn't want artificial ventilation but isn't clear about this.   Not sure about tube feeds   Review of Systems No abdominal pain now No blood in stools miralax keeps him regular Appetite fair--weight stable    Objective:   Physical Exam  Constitutional: He appears well-developed. No distress.  Neck: Normal range of motion. Neck supple. No thyromegaly present.  Cardiovascular: Normal rate, regular rhythm, normal heart sounds and intact distal pulses.  Exam reveals no gallop.   No murmur heard. Faint distal pulses  Pulmonary/Chest: Effort normal and breath sounds normal. No respiratory distress. He has no wheezes. He has no rales.  Musculoskeletal: He exhibits no edema and no tenderness.  Lymphadenopathy:    He has no cervical adenopathy.  Psychiatric: He has a normal mood and affect. His behavior is normal.          Assessment & Plan:

## 2013-06-09 ENCOUNTER — Encounter: Payer: Self-pay | Admitting: Internal Medicine

## 2013-06-18 DIAGNOSIS — I5043 Acute on chronic combined systolic (congestive) and diastolic (congestive) heart failure: Secondary | ICD-10-CM

## 2013-06-18 DIAGNOSIS — I4891 Unspecified atrial fibrillation: Secondary | ICD-10-CM

## 2013-06-18 DIAGNOSIS — G9782 Other postprocedural complications and disorders of nervous system: Secondary | ICD-10-CM

## 2013-06-18 DIAGNOSIS — J441 Chronic obstructive pulmonary disease with (acute) exacerbation: Secondary | ICD-10-CM

## 2013-06-18 DIAGNOSIS — I509 Heart failure, unspecified: Secondary | ICD-10-CM

## 2013-06-23 ENCOUNTER — Other Ambulatory Visit: Payer: Self-pay | Admitting: Family Medicine

## 2013-07-01 ENCOUNTER — Encounter: Payer: Self-pay | Admitting: *Deleted

## 2013-07-02 ENCOUNTER — Encounter: Payer: Self-pay | Admitting: Internal Medicine

## 2013-07-02 ENCOUNTER — Ambulatory Visit (INDEPENDENT_AMBULATORY_CARE_PROVIDER_SITE_OTHER): Payer: Medicare Other | Admitting: Internal Medicine

## 2013-07-02 VITALS — BP 122/62 | HR 72 | Temp 97.6°F | Wt 124.0 lb

## 2013-07-02 DIAGNOSIS — J449 Chronic obstructive pulmonary disease, unspecified: Secondary | ICD-10-CM

## 2013-07-02 DIAGNOSIS — M519 Unspecified thoracic, thoracolumbar and lumbosacral intervertebral disc disorder: Secondary | ICD-10-CM

## 2013-07-02 NOTE — Assessment & Plan Note (Signed)
Better pain control on higher hydrocodone dose No change now

## 2013-07-02 NOTE — Assessment & Plan Note (Signed)
Seems to be the main reason for the chronic DOE Stable now

## 2013-07-02 NOTE — Progress Notes (Signed)
Subjective:    Patient ID: [name], male    DOB: [birth year], 79 y.o.   MRN: [id number]  HPI Doing better The increased hydrocodone "is really holding me"  His breathing is the worst issue now Does okay supine or sitting Gets DOE easily with exertion Has stopped yard and garden work (except for a little gardening while sitting on a stool) No chest pain  Current Outpatient Prescriptions on File Prior to Visit  Medication Sig Dispense Refill  . amitriptyline (ELAVIL) 25 MG tablet Take 25 mg by mouth at bedtime.      Marland Kitchen aspirin 81 MG tablet Take 81 mg by mouth every morning.       . carvedilol (COREG) 3.125 MG tablet TAKE 1 TABLET BY MOUTH TWICE A DAY WITH A MEAL  60 tablet  5  . diltiazem (CARDIZEM CD) 300 MG 24 hr capsule Take 1 capsule (300 mg total) by mouth daily.  30 capsule  0  . fluticasone (FLONASE) 50 MCG/ACT nasal spray Place 2 sprays into the nose daily. 2 spray, Each Nare, Daily  16 g  0  . furosemide (LASIX) 40 MG tablet Take 1 tablet (40 mg total) by mouth 2 (two) times daily.  60 tablet  11  . HYDROcodone-acetaminophen (NORCO) 10-325 MG per tablet Take 1 tablet by mouth 3 (three) times daily as needed for pain.  90 tablet  0  . levothyroxine (SYNTHROID, LEVOTHROID) 25 MCG tablet Take 25 mcg by mouth every morning.      . nitroGLYCERIN (NITROSTAT) 0.4 MG SL tablet Place 0.4 mg under the tongue every 5 (five) minutes as needed for chest pain. x3 doses for for chest pain      . potassium chloride SA (K-DUR,KLOR-CON) 20 MEQ tablet Take 1 tablet (20 mEq total) by mouth daily.  30 tablet  11  . PROVENTIL HFA 108 (90 BASE) MCG/ACT inhaler INHALE 2 PUFFS INTO THE LUNGS EVERY 4 HOURS AS NEEDED FOR WHEEZING.  6.7 each  2  . RAPAFLO 8 MG CAPS Take 8 mg by mouth daily.       Marland Kitchen SPIRIVA HANDIHALER 18 MCG inhalation capsule PLACE 1 CAPSULE INTO INHALER AND INHALE ONCE DAILY.  30 capsule  9  . tiotropium (SPIRIVA) 18 MCG inhalation capsule Place 18 mcg into inhaler and inhale daily.       Marland Kitchen warfarin (COUMADIN) 2.5 MG tablet Take 2.5 mg by mouth 2 (two) times a week. Takes on Tues and Thurs in the morning      . warfarin (COUMADIN) 5 MG tablet Take as directed by [facility].  30 tablet  2   No current facility-administered medications on file prior to visit.    Allergies  Allergen Reactions  . Atorvastatin Other (See Comments)    REACTION: severe muscle aches  . Ezetimibe-Simvastatin Other (See Comments)    REACTION: severe muscle aches  . Fenofibrate     REACTION: "ran me up a wall"  . Penicillins Other (See Comments)    blisters    Past Medical History  Diagnosis Date  . CAD (coronary artery disease)   . Hyperlipidemia   . Kidney stones   . BPH (benign prostatic hypertrophy)   . Osteoarthritis   . COPD (chronic obstructive pulmonary disease)   . Hypothyroidism   . Atrial fibrillation   . Impaired fasting glucose   . Myocardial infarction   . Hypertension   . Shortness of breath   . CHF (congestive heart failure)   . GERD (  gastroesophageal reflux disease)     Past Surgical History  Procedure Laterality Date  . Inguinal hernia repair  09/2004    left  . Back surgery  1990's    x3  . Transurethral resection of prostate  2002  . Pleural scarification  1980    right  . Esophagogastroduodenoscopy  04/2002    negative  . Bladder stone removal  06/2006  . Colonoscopy Left 04/13/2013    Procedure: COLONOSCOPY;  Surgeon: Willis Modena, MD;  Location: [facility] ENDOSCOPY;  Service: Endoscopy;  Laterality: Left;    Family History  Problem Relation Age of Onset  . Heart disease Mother   . Heart disease Brother   . Stroke Brother   . Cancer Neg Hx   . Diabetes Neg Hx   . Hypertension Neg Hx   . Heart disease Brother   . Stroke Brother   . Heart disease Brother   . Stroke Brother     History   Social History  . Marital Status: Married    Spouse Name: N/A    Number of Children: 1  . Years of Education: N/A   Occupational History  . retired  Therapist, music    Social History Main Topics  . Smoking status: Former Smoker -- 1.00 packs/day for 60 years    Types: Cigarettes    Quit date: 12/24/2011  . Smokeless tobacco: Never Used  . Alcohol Use: No  . Drug Use: No  . Sexually Active: Not on file   Other Topics Concern  . Not on file   Social History Narrative   Does lots of yard work and a big garden   Has living will   No [place] health care POA. Asks for daughter Ardeen Fillers to make decisions if needed.   Discussed DNR--- he doesn't want artificial ventilation but isn't clear about this.   Not sure about tube feeds   Review of Systems Sleeps okay Appetite is fine    Objective:   Physical Exam  Constitutional: He appears well-developed. No distress.  Neck: Normal range of motion.  Cardiovascular: Normal rate, regular rhythm and normal heart sounds.  Exam reveals no gallop.   No murmur heard. Pulmonary/Chest: Effort normal. No respiratory distress. He has no wheezes. He has no rales.  Muffled breath sounds with slight rhonchi  Lymphadenopathy:    He has no cervical adenopathy.  Neurological: He exhibits abnormal muscle tone.          Assessment & Plan:

## 2013-07-05 ENCOUNTER — Ambulatory Visit (INDEPENDENT_AMBULATORY_CARE_PROVIDER_SITE_OTHER): Payer: Medicare Other | Admitting: Family Medicine

## 2013-07-05 DIAGNOSIS — Z5181 Encounter for therapeutic drug level monitoring: Secondary | ICD-10-CM

## 2013-07-05 DIAGNOSIS — I4891 Unspecified atrial fibrillation: Secondary | ICD-10-CM

## 2013-07-05 DIAGNOSIS — Z7901 Long term (current) use of anticoagulants: Secondary | ICD-10-CM

## 2013-07-05 NOTE — Patient Instructions (Signed)
Continue to take 5 mg daily , except 2.5 mg Tues/Thurs, Re-check in 6 weeks.

## 2013-07-13 ENCOUNTER — Other Ambulatory Visit: Payer: Self-pay | Admitting: Internal Medicine

## 2013-07-13 NOTE — Telephone Encounter (Signed)
rx called into pharmacy

## 2013-07-13 NOTE — Telephone Encounter (Signed)
Okay #90 x 0 

## 2013-07-30 ENCOUNTER — Encounter: Payer: Self-pay | Admitting: Cardiology

## 2013-07-30 ENCOUNTER — Other Ambulatory Visit: Payer: Self-pay | Admitting: Cardiology

## 2013-07-30 ENCOUNTER — Ambulatory Visit
Admission: RE | Admit: 2013-07-30 | Discharge: 2013-07-30 | Disposition: A | Discharge status: Home / Self Care | Payer: Medicare Other | Source: Ambulatory Visit | Attending: Cardiology | Admitting: Cardiology

## 2013-07-30 DIAGNOSIS — J90 Pleural effusion, not elsewhere classified: Secondary | ICD-10-CM

## 2013-07-30 NOTE — Progress Notes (Signed)
Patient ID: [name], male   DOB: [birth year], 79 y.o.   MRN: [id number]   [name]  Date of visit:  07/30/2013 DOB:  [birth year]    Age:  79 yrs. Medical record number:  [id number]     Account number:  [id number] Primary Care Provider: Tillman Holland ____________________________ CURRENT DIAGNOSES  1. Arrhythmia-Atrial Fibrillation  2. Fatigue/malaise  3. CAD,Native  4. Hyperlipidemia  5. Stent Placement  6. Long Term Use Anticoagulant  7. Hypothyroidism  8. COPD  9. Pleural effusion ____________________________ ALLERGIES  Amoxicillin, Intolerance-unknown  Atorvastatin, Muscle aches  Fenofibrate, Intolerance-unknown  Pravastatin, Intolerance-dizziness  Rofecoxib, Intolerance-unknown  Rosuvastatin, Muscle aches  Simvastatin, Muscle weakness ____________________________ MEDICATIONS  1. nitroglycerin 0.4 mg tablet, sublingual, PRN  2. hydrocodone-acetaminophen 10-325 mg tablet, PRN  3. Amitriptyline 25 mg Tablet, QHS  4. albuterol sulfate 90 mcg/Actuation HFA Aerosol Inhaler, PRN  5. Spiriva with HandiHaler 18 mcg Capsule, w/Inhalation Device, qd  6. warfarin 5 mg Tablet, Take as directed  7. levothyroxine 25 mcg Tablet, 1 p.o. daily  8. Rapaflo 8 mg Capsule, 1 p.o. daily  9. carvedilol 3.125 mg tablet, BID  10. fluticasone 50 mcg/actuation disk with device, 2 spray each nostril qd  11. furosemide 40 mg tablet, BID  12. Klor-Con M20 20 mEq tablet,ER particles/crystals, 1 p.o. daily  13. diltiazem HCl 300 mg capsule, extended release, 1 p.o. daily  14. aspirin 81 mg tablet,chewable, 1 p.o. daily ____________________________ HISTORY OF PRESENT ILLNESS  Patient seen for cardiac followup. He is clinically doing well with no angina. His atrial fibrillation rate has been controlled and he has no bleeding complications. He has had no recurrence of pleural effusion. He continues to be moderately weak and fatigued. His rate appears to be relatively well-controlled at the present  time. Recent results were reviewed from his primary care physician's office. His dyspnea has settled down and he has no angina. He has no PND, orthopnea or edema. ____________________________ PAST HISTORY  Past Medical Illnesses:  hyperlipidemia, hyperlipidemia, prostatitis, BPH, COPD, pleural effusion, hypothyroidism;  Cardiovascular Illnesses:  CAD, S/P MI-subendocardial March 2003, atrial fibrillation;  Surgical Procedures:  lumbar laminectomy x 3, TURP, pneumothorax, inguinal herniorrhaphy-left;  NYHA Classification:  II;  Cardiology Procedures-Invasive:  stent CFX March 2003, cardiac cath (left) May 2009;  Cardiology Procedures-Noninvasive:  adenosine cardiolite April 2008;  Cardiac Cath Results:  normal Left main, diffuse luminal irregularities LAD, 70% stenosis mid Ramus, 70% stenosis proximal RCA, widely patent stent in circumflex;  LVEF of 50% documented via echocardiogram on 02/22/2013,   ____________________________ CARDIO-PULMONARY TEST DATES EKG Date:  04/23/2013;   Cardiac Cath Date:  05/05/2008;  Stent Placement Date: 03/16/2002;  Nuclear Study Date:  04/16/2007;  Echocardiography Date: 04/08/2013;  Chest Xray Date: 02/25/2013;  CT Scan Date:  11/16/2008   ____________________________ FAMILY HISTORY Brother -- Unknown Disease, Deceased Brother -- Coronary Artery Disease, Deceased Brother -- Unknown Disease, Deceased Brother -- CVA, Deceased Brother -- Brother alive and well Mother -- Coronary Artery Disease, Deceased Sister -- Myocardial infarction, Deceased Sister -- Suicide, Deceased Sister -- Sister alive and well Sister -- CVA ____________________________ SOCIAL HISTORY Alcohol Use:  no alcohol use;  Smoking:  used to smoke but quit 2012;  Diet:  regular diet without modifications;  Lifestyle:  married;  Exercise:  exercises daily and walking;  Occupation:  retired and Therapist, music;  Residence:  lives with wife;   ____________________________ REVIEW OF SYSTEMS General:   malaise and fatigue Eyes: wears eye glasses/contact lenses, denies diplopia, history of  glaucoma or visual field defects. Respiratory: see HPI Cardiovascular:  please review HPI Abdominal: denies dyspepsia, GI bleeding, constipation, or diarrhea Genitourinary-Male: hesitancy  Musculoskeletal:  arthritis of the neck and shoulder, arthritis of the knees, arthritis of the hips, chronic low back pain Neurological:  denies headaches, stroke, or TIA  ____________________________ PHYSICAL EXAMINATION VITAL SIGNS  Blood Pressure:  106/54 Sitting, Right arm, regular cuff   Pulse:  80/min. Weight:  122.00 lbs. Height:  67"BMI: 19  Constitutional:  pleasant, alert, white, male, in no acute distress Skin:  scattered telangectasia Head:  normocephalic, balding male hair pattern ENT:  ears, nose and throat reveal no gross abnormalities.  Dentition good. Neck:  supple, without massess. No JVD, thyromegaly or carotid bruits. Carotid upstroke normal. Chest:  increased A-P diameter, clear to auscultation and percussion Cardiac:  irregular rhythm, normal S1 and S2, no S3 or S4, no murmurs,clicks, or rub heard Peripheral Pulses:  left femoral bruit present, dorsalis pedis pulses 2+, posterior tibial pulses 2+ Extremities & Back:  no deformities, clubbing, cyanosis, erythema or edema observed. Normal muscle strength and tone. Neurological:  no gross motor or sensory deficits noted, affect appropriate, oriented x3. ____________________________ MOST RECENT LIPID PANEL 05/05/12  CHOL TOTL 222 mg/dl, LDL 147 calc, HDL 50 mg/dl, TRIGLYCER 829 mg/dl and CHOL/HDL 4.4 (Calc) ____________________________ IMPRESSIONS/PLAN  1. Atrial fibrillation currently rate controlled 2. Previous pleural effusion with no evidence of recurrence 3. COPD 4. Long-term treatment with warfarin 5. Previous coronary artery disease with stenting  Recommendations:  A chest x-ray ordered today showed no recurrence of pleural effusion. He  has clinically done well but continues to be fatigued. This may have something to do with his atrial fibrillation. I did check lab work as noted below and will see him back in followup. ____________________________ TODAYS ORDERS  1. CHEST XRAY: Today  2. Comprehensive Metabolic Panel: Today  3. TSH: Today  4. Complete Blood Count: Today  5. Lipid Panel: Today  6. Return Visit: 6 months                       ____________________________ Cardiology Physician:  Darden Palmer MD [facility]

## 2013-08-03 ENCOUNTER — Ambulatory Visit: Payer: Medicare Other | Admitting: Internal Medicine

## 2013-08-16 ENCOUNTER — Ambulatory Visit (INDEPENDENT_AMBULATORY_CARE_PROVIDER_SITE_OTHER): Payer: Medicare Other | Admitting: Family Medicine

## 2013-08-16 ENCOUNTER — Other Ambulatory Visit: Payer: Self-pay | Admitting: Internal Medicine

## 2013-08-16 DIAGNOSIS — I4891 Unspecified atrial fibrillation: Secondary | ICD-10-CM

## 2013-08-16 DIAGNOSIS — Z7901 Long term (current) use of anticoagulants: Secondary | ICD-10-CM

## 2013-08-16 DIAGNOSIS — Z5181 Encounter for therapeutic drug level monitoring: Secondary | ICD-10-CM

## 2013-08-16 NOTE — Telephone Encounter (Signed)
Okay #90 x 0 

## 2013-08-16 NOTE — Telephone Encounter (Signed)
rx called into pharmacy

## 2013-08-16 NOTE — Patient Instructions (Signed)
Skip Coumadin tomorrow and Wednesday and then continue to take 5 mg daily , except 2.5 mg Tues/Thurs, Re-check in 2 weeks.  Encouraged pt to increase intake of green vegetables.

## 2013-08-30 ENCOUNTER — Ambulatory Visit (INDEPENDENT_AMBULATORY_CARE_PROVIDER_SITE_OTHER): Payer: Medicare Other | Admitting: Family Medicine

## 2013-08-30 DIAGNOSIS — I4891 Unspecified atrial fibrillation: Secondary | ICD-10-CM

## 2013-08-30 DIAGNOSIS — Z5181 Encounter for therapeutic drug level monitoring: Secondary | ICD-10-CM

## 2013-08-30 DIAGNOSIS — Z7901 Long term (current) use of anticoagulants: Secondary | ICD-10-CM

## 2013-08-30 LAB — POCT INR: INR: 5.2

## 2013-08-30 NOTE — Patient Instructions (Signed)
Skip Coumadin tomorrow and Wednesday or Thursday morning, Re-check on Thursday.

## 2013-09-02 ENCOUNTER — Ambulatory Visit (INDEPENDENT_AMBULATORY_CARE_PROVIDER_SITE_OTHER): Payer: Medicare Other | Admitting: Family Medicine

## 2013-09-02 DIAGNOSIS — I4891 Unspecified atrial fibrillation: Secondary | ICD-10-CM

## 2013-09-02 DIAGNOSIS — Z7901 Long term (current) use of anticoagulants: Secondary | ICD-10-CM

## 2013-09-02 DIAGNOSIS — Z5181 Encounter for therapeutic drug level monitoring: Secondary | ICD-10-CM

## 2013-09-02 NOTE — Patient Instructions (Signed)
Start back taking 5mg  daily except 2.5mg  on Tues/Thurs.  Recheck in 4 weeks.

## 2013-09-12 ENCOUNTER — Other Ambulatory Visit: Payer: Self-pay | Admitting: Internal Medicine

## 2013-09-20 ENCOUNTER — Other Ambulatory Visit: Payer: Self-pay | Admitting: Internal Medicine

## 2013-09-20 NOTE — Telephone Encounter (Signed)
Last filled 08/16/13 

## 2013-09-20 NOTE — Telephone Encounter (Signed)
Okay #90 x 0 

## 2013-09-20 NOTE — Telephone Encounter (Signed)
rx called into pharmacy

## 2013-09-30 ENCOUNTER — Ambulatory Visit (INDEPENDENT_AMBULATORY_CARE_PROVIDER_SITE_OTHER): Payer: Medicare Other | Admitting: Family Medicine

## 2013-09-30 ENCOUNTER — Telehealth: Payer: Self-pay | Admitting: Family Medicine

## 2013-09-30 DIAGNOSIS — I4891 Unspecified atrial fibrillation: Secondary | ICD-10-CM

## 2013-09-30 DIAGNOSIS — Z7901 Long term (current) use of anticoagulants: Secondary | ICD-10-CM

## 2013-09-30 DIAGNOSIS — Z5181 Encounter for therapeutic drug level monitoring: Secondary | ICD-10-CM

## 2013-09-30 NOTE — Telephone Encounter (Signed)
Patient advised.  He wishes to try to go off of the Spiriva right now and he is aware to go back on it if his breathing gets worse and call us if he wants to try the Atrovent.

## 2013-09-30 NOTE — Telephone Encounter (Signed)
There are 2 choices  He can try ipratropium inhaler 2 puffs tid (atrovent) but I am not sure that is less expensive  He can try briefly off the spiriva to see if his breathing is worse and if it is, go back on the spiriva or try the ipratropium

## 2013-09-30 NOTE — Telephone Encounter (Signed)
Pt says that Spiriva inhaler is now over $300 with his insurance and wants to know if something else can be called in for him.

## 2013-10-04 ENCOUNTER — Telehealth: Payer: Self-pay | Admitting: Internal Medicine

## 2013-10-04 ENCOUNTER — Other Ambulatory Visit: Payer: Self-pay | Admitting: Internal Medicine

## 2013-10-04 MED ORDER — IPRATROPIUM BROMIDE HFA 17 MCG/ACT IN AERS: 2.0000 | INHALATION_SPRAY | Freq: Three times a day (TID) | RESPIRATORY_TRACT | Status: DC | PRN

## 2013-10-04 NOTE — Telephone Encounter (Signed)
Atrovent sent to pharmacy per Dr.Letvak's note

## 2013-10-04 NOTE — Telephone Encounter (Signed)
Pt left note; pt is no longer on Spiriva and request replacement med for Spiriva sent to  [facility].Please advise.

## 2013-10-04 NOTE — Telephone Encounter (Signed)
[name] is in class, ok to fill?

## 2013-10-28 ENCOUNTER — Other Ambulatory Visit: Payer: Self-pay | Admitting: Family Medicine

## 2013-10-28 ENCOUNTER — Ambulatory Visit (INDEPENDENT_AMBULATORY_CARE_PROVIDER_SITE_OTHER): Payer: Medicare Other | Admitting: Family Medicine

## 2013-10-28 DIAGNOSIS — Z5181 Encounter for therapeutic drug level monitoring: Secondary | ICD-10-CM

## 2013-10-28 DIAGNOSIS — I4891 Unspecified atrial fibrillation: Secondary | ICD-10-CM

## 2013-10-28 DIAGNOSIS — Z7901 Long term (current) use of anticoagulants: Secondary | ICD-10-CM

## 2013-10-28 LAB — POCT INR: INR: 2.2

## 2013-10-28 MED ORDER — HYDROCODONE-ACETAMINOPHEN 10-325 MG PO TABS: ORAL_TABLET | ORAL | Status: DC

## 2013-10-28 NOTE — Telephone Encounter (Signed)
Refill request for hydrocodone-acetaminophen.  Please call pt when ready for pick up.

## 2013-10-28 NOTE — Telephone Encounter (Signed)
Phone just rang, no answering machine, will try again later.   rx is ready for pick-up, and it will be at the front desk.

## 2013-10-29 NOTE — Telephone Encounter (Signed)
Pt called for status of hydrocodone rx. Advised pt at front desk for pick up.

## 2013-11-02 ENCOUNTER — Ambulatory Visit (INDEPENDENT_AMBULATORY_CARE_PROVIDER_SITE_OTHER): Payer: Medicare Other | Admitting: Internal Medicine

## 2013-11-02 ENCOUNTER — Other Ambulatory Visit: Payer: Self-pay

## 2013-11-02 ENCOUNTER — Encounter: Payer: Self-pay | Admitting: Internal Medicine

## 2013-11-02 VITALS — BP 108/60 | HR 76 | Temp 97.8°F | Wt 122.0 lb

## 2013-11-02 DIAGNOSIS — M519 Unspecified thoracic, thoracolumbar and lumbosacral intervertebral disc disorder: Secondary | ICD-10-CM

## 2013-11-02 DIAGNOSIS — N400 Enlarged prostate without lower urinary tract symptoms: Secondary | ICD-10-CM

## 2013-11-02 DIAGNOSIS — I4891 Unspecified atrial fibrillation: Secondary | ICD-10-CM

## 2013-11-02 DIAGNOSIS — I5032 Chronic diastolic (congestive) heart failure: Secondary | ICD-10-CM

## 2013-11-02 DIAGNOSIS — J449 Chronic obstructive pulmonary disease, unspecified: Secondary | ICD-10-CM

## 2013-11-02 MED ORDER — IPRATROPIUM BROMIDE HFA 17 MCG/ACT IN AERS: 2.0000 | INHALATION_SPRAY | Freq: Four times a day (QID) | RESPIRATORY_TRACT | Status: DC

## 2013-11-02 MED ORDER — ALBUTEROL SULFATE HFA 108 (90 BASE) MCG/ACT IN AERS: 2.0000 | INHALATION_SPRAY | RESPIRATORY_TRACT | Status: DC | PRN

## 2013-11-02 NOTE — Assessment & Plan Note (Signed)
Good control on med

## 2013-11-02 NOTE — Assessment & Plan Note (Signed)
Good rate control On coumadin 

## 2013-11-02 NOTE — Assessment & Plan Note (Signed)
Reasonable control with the hydrocodone

## 2013-11-02 NOTE — Assessment & Plan Note (Signed)
Compensated Stable fluid status

## 2013-11-02 NOTE — Telephone Encounter (Signed)
Okay to refill the ipratropium #1 x 11 Albuterol #1 x 3

## 2013-11-02 NOTE — Progress Notes (Signed)
Subjective:    Patient ID: [name], male    DOB: [birth year], 79 y.o.   MRN: [id number]  HPI Here for follow up  Still has problems with his breathing No problems sitting Gets DOE with walking-- as soon as 50-100 yards Has to rest with any housework Regular cough and AM phlegm Takes the ipratropium bid usually  Reasonable pain control with the hydrocodone Takes 1.5-3 tabs per day  Satisfied with this  No heart trouble No chest pain No palpitations Thinks his dyspnea is from his lungs No edema  Voids okay Slow but stable Nocturia usually twice--but occasionally none  Current Outpatient Prescriptions on File Prior to Visit  Medication Sig Dispense Refill  . amitriptyline (ELAVIL) 25 MG tablet Take 25 mg by mouth at bedtime.      Marland Kitchen aspirin 81 MG tablet Take 81 mg by mouth every morning.       . carvedilol (COREG) 3.125 MG tablet TAKE 1 TABLET BY MOUTH TWICE A DAY WITH A MEAL  60 tablet  5  . diltiazem (CARDIZEM CD) 300 MG 24 hr capsule Take 1 capsule (300 mg total) by mouth daily.  30 capsule  0  . fluticasone (FLONASE) 50 MCG/ACT nasal spray Place 2 sprays into the nose daily. 2 spray, Each Nare, Daily  16 g  0  . furosemide (LASIX) 40 MG tablet Take 1 tablet (40 mg total) by mouth 2 (two) times daily.  60 tablet  11  . HYDROcodone-acetaminophen (NORCO) 10-325 MG per tablet TAKE 1 TABLET BY MOUTH 3 TIMES A DAY AS NEEDED FOR PAIN  90 tablet  0  . levothyroxine (SYNTHROID, LEVOTHROID) 25 MCG tablet Take 25 mcg by mouth every morning.      . nitroGLYCERIN (NITROSTAT) 0.4 MG SL tablet Place 0.4 mg under the tongue every 5 (five) minutes as needed for chest pain. x3 doses for for chest pain      . potassium chloride SA (K-DUR,KLOR-CON) 20 MEQ tablet Take 1 tablet (20 mEq total) by mouth daily.  30 tablet  11  . PROVENTIL HFA 108 (90 BASE) MCG/ACT inhaler INHALE 2 PUFFS INTO THE LUNGS EVERY 4 HOURS AS NEEDED FOR WHEEZING.  6.7 each  2  . RAPAFLO 8 MG CAPS Take 8 mg by mouth  daily.       Marland Kitchen warfarin (COUMADIN) 2.5 MG tablet Take 2.5 mg by mouth 2 (two) times a week. Takes on Tues and Thurs in the morning      . warfarin (COUMADIN) 5 MG tablet TAKE AS DIRECTED BY COUMADIN CLINIC.  30 tablet  2   No current facility-administered medications on file prior to visit.    Allergies  Allergen Reactions  . Atorvastatin Other (See Comments)    REACTION: severe muscle aches  . Ezetimibe-Simvastatin Other (See Comments)    REACTION: severe muscle aches  . Fenofibrate     REACTION: "ran me up a wall"  . Penicillins Other (See Comments)    blisters    Past Medical History  Diagnosis Date  . CAD (coronary artery disease)   . Hyperlipidemia   . Kidney stones   . BPH (benign prostatic hypertrophy)   . Osteoarthritis   . COPD (chronic obstructive pulmonary disease)   . Hypothyroidism   . Atrial fibrillation   . Impaired fasting glucose   . Myocardial infarction   . Hypertension   . Shortness of breath   . CHF (congestive heart failure)   . GERD (gastroesophageal reflux disease)  Past Surgical History  Procedure Laterality Date  . Inguinal hernia repair  09/2004    left  . Back surgery  1990's    x3  . Transurethral resection of prostate  2002  . Pleural scarification  1980    right  . Esophagogastroduodenoscopy  04/2002    negative  . Bladder stone removal  06/2006  . Colonoscopy Left 04/13/2013    Procedure: COLONOSCOPY;  Surgeon: Willis Modena, MD;  Location[facility] END[facility]COPY;  Service: Endoscopy;  Laterality: Left;    Family History  Problem Relation Age of Onset  . Heart disease Mother   . Heart disease Brother   . Stroke Brother   . Cancer Neg Hx   . Diabetes Neg Hx   . Hypertension Neg Hx   . Heart disease Brother   . Stroke Brother   . Heart disease Brother   . Stroke Brother     History   Social History  . Marital Status: Married    Spouse Name: N/A    Number of Children: 1  . Years of Education: N/A   Occupational History   . retired Therapist, music    Social History Main Topics  . Smoking status: Former Smoker -- 1.00 packs/day for 60 years    Types: Cigarettes    Quit date: 12/24/2011  . Smokeless tobacco: Never Used  . Alcohol Use: No  . Drug Use: No  . Sexual Activity: Not on file   Other Topics Concern  . Not on file   Social History Narrative   Does lots of yard work and a big garden   Has living will   No [place] health care POA. Asks for daughter Ardeen Fillers to make decisions if needed.   Discussed DNR--- he doesn't want artificial ventilation but isn't clear about this.   Not sure about tube feeds   Review of Systems Appetite is good Weight down 2# Sleeps well Takes miralax daily for bowels     Objective:   Physical Exam  Constitutional: He appears well-developed. No distress.  Neck: Normal range of motion. Neck supple. No thyromegaly present.  Cardiovascular: Normal rate and normal heart sounds.  Exam reveals no gallop.   No murmur heard. irregular  Pulmonary/Chest: Effort normal. No respiratory distress. He has no wheezes. He has no rales.  Decreased breath sounds but clear  Abdominal: Soft. There is no tenderness.  Musculoskeletal: He exhibits no edema and no tenderness.  Lymphadenopathy:    He has no cervical adenopathy.  Psychiatric: He has a normal mood and affect. His behavior is normal.          Assessment & Plan:

## 2013-11-02 NOTE — Assessment & Plan Note (Signed)
Stable but limited status Will have him increase frequency of ipratropium

## 2013-11-02 NOTE — Telephone Encounter (Signed)
rx sent to pharmacy by e-script  

## 2013-11-02 NOTE — Progress Notes (Signed)
Pre-visit discussion using our clinic review tool. No additional management support is needed unless otherwise documented below in the visit note.  

## 2013-11-02 NOTE — Telephone Encounter (Signed)
[name] pts daughter left v/m requesting pt's 2 inhalers sent for 90 day supply to [facility]. How many inhalers should be prescribed for 3 month rx. Pt was seen today for COPD.

## 2013-11-08 ENCOUNTER — Telehealth: Payer: Self-pay | Admitting: Internal Medicine

## 2013-11-08 NOTE — Telephone Encounter (Signed)
Pt is calling and wanting a call back from you. He has questions about his rx and wanted to show you a letter he received in the mail.

## 2013-11-08 NOTE — Telephone Encounter (Signed)
Pt got a letter about his medication and pt will bring letter by office one day for review.

## 2013-11-09 ENCOUNTER — Other Ambulatory Visit: Payer: Self-pay | Admitting: Internal Medicine

## 2013-11-09 MED ORDER — ALBUTEROL SULFATE HFA 108 (90 BASE) MCG/ACT IN AERS: 2.0000 | INHALATION_SPRAY | RESPIRATORY_TRACT | Status: DC | PRN

## 2013-11-09 NOTE — Telephone Encounter (Signed)
They are pretty much the same medication Okay to change to proair

## 2013-11-09 NOTE — Telephone Encounter (Signed)
Pt brought by letter from [facility] as of Jan 01.2015 Proventil HFA will no longer be covered by ins. And pt would have to pay out of pocket for medication; Proair HFA is covered option. Pt request review of letter and cb pt. Letter in Dr Karle Starch in box.Please advise.

## 2013-11-09 NOTE — Telephone Encounter (Signed)
Med list updated and new medication sent to [facility]

## 2013-11-17 ENCOUNTER — Other Ambulatory Visit: Payer: Self-pay | Admitting: Internal Medicine

## 2013-11-25 ENCOUNTER — Ambulatory Visit (INDEPENDENT_AMBULATORY_CARE_PROVIDER_SITE_OTHER): Payer: Medicare Other | Admitting: Family Medicine

## 2013-11-25 DIAGNOSIS — Z5181 Encounter for therapeutic drug level monitoring: Secondary | ICD-10-CM

## 2013-11-25 DIAGNOSIS — Z7901 Long term (current) use of anticoagulants: Secondary | ICD-10-CM

## 2013-11-25 DIAGNOSIS — I4891 Unspecified atrial fibrillation: Secondary | ICD-10-CM

## 2013-11-25 NOTE — Patient Instructions (Signed)
Skip Coumadin tomorrow (12/5) and then continue 5mg  daily except 2.5mg  on Tues/Thurs.  Recheck in 4 weeks.

## 2013-12-01 ENCOUNTER — Other Ambulatory Visit: Payer: Self-pay

## 2013-12-01 MED ORDER — HYDROCODONE-ACETAMINOPHEN 10-325 MG PO TABS: ORAL_TABLET | ORAL | Status: DC

## 2013-12-01 NOTE — Telephone Encounter (Signed)
Pt left v/m requesting rx hydrocodone apap. Call when ready for pick up.  

## 2013-12-01 NOTE — Telephone Encounter (Signed)
Spoke with patient and advised rx ready for pick-up and it will be at the front desk.  

## 2013-12-22 ENCOUNTER — Ambulatory Visit (INDEPENDENT_AMBULATORY_CARE_PROVIDER_SITE_OTHER): Payer: Medicare Other | Admitting: Family Medicine

## 2013-12-22 DIAGNOSIS — Z7901 Long term (current) use of anticoagulants: Secondary | ICD-10-CM

## 2013-12-22 DIAGNOSIS — I4891 Unspecified atrial fibrillation: Secondary | ICD-10-CM

## 2013-12-22 DIAGNOSIS — Z5181 Encounter for therapeutic drug level monitoring: Secondary | ICD-10-CM

## 2013-12-24 ENCOUNTER — Ambulatory Visit: Payer: Medicare Other

## 2014-01-04 ENCOUNTER — Other Ambulatory Visit: Payer: Self-pay

## 2014-01-04 MED ORDER — HYDROCODONE-ACETAMINOPHEN 10-325 MG PO TABS: ORAL_TABLET | ORAL | Status: DC

## 2014-01-04 NOTE — Telephone Encounter (Signed)
Spoke with patient and advised rx ready for pick-up and it will be at the front desk.  

## 2014-01-04 NOTE — Telephone Encounter (Signed)
Pt left message requesting rx hydrocodone apap. Call when ready for pick up. 

## 2014-01-07 ENCOUNTER — Other Ambulatory Visit: Payer: Self-pay | Admitting: Internal Medicine

## 2014-01-08 ENCOUNTER — Encounter (HOSPITAL_COMMUNITY): Payer: Self-pay | Admitting: Emergency Medicine

## 2014-01-08 ENCOUNTER — Emergency Department (HOSPITAL_COMMUNITY): Payer: Medicare Other

## 2014-01-08 ENCOUNTER — Inpatient Hospital Stay (HOSPITAL_COMMUNITY)
Admission: EM | Admit: 2014-01-08 | Discharge: 2014-01-12 | DRG: 308 | Disposition: A | Discharge status: Home / Self Care | Payer: Medicare Other | Attending: Cardiology | Admitting: Cardiology

## 2014-01-08 DIAGNOSIS — Z681 Body mass index (BMI) 19 or less, adult: Secondary | ICD-10-CM

## 2014-01-08 DIAGNOSIS — I4891 Unspecified atrial fibrillation: Principal | ICD-10-CM | POA: Diagnosis present

## 2014-01-08 DIAGNOSIS — I2510 Atherosclerotic heart disease of native coronary artery without angina pectoris: Secondary | ICD-10-CM | POA: Diagnosis present

## 2014-01-08 DIAGNOSIS — Z823 Family history of stroke: Secondary | ICD-10-CM

## 2014-01-08 DIAGNOSIS — Z9861 Coronary angioplasty status: Secondary | ICD-10-CM

## 2014-01-08 DIAGNOSIS — Z888 Allergy status to other drugs, medicaments and biological substances status: Secondary | ICD-10-CM

## 2014-01-08 DIAGNOSIS — Z880 Allergy status to penicillin: Secondary | ICD-10-CM

## 2014-01-08 DIAGNOSIS — N179 Acute kidney failure, unspecified: Secondary | ICD-10-CM | POA: Diagnosis present

## 2014-01-08 DIAGNOSIS — N189 Chronic kidney disease, unspecified: Secondary | ICD-10-CM | POA: Diagnosis present

## 2014-01-08 DIAGNOSIS — Z79899 Other long term (current) drug therapy: Secondary | ICD-10-CM

## 2014-01-08 DIAGNOSIS — E43 Unspecified severe protein-calorie malnutrition: Secondary | ICD-10-CM | POA: Diagnosis present

## 2014-01-08 DIAGNOSIS — J449 Chronic obstructive pulmonary disease, unspecified: Secondary | ICD-10-CM | POA: Diagnosis present

## 2014-01-08 DIAGNOSIS — I509 Heart failure, unspecified: Secondary | ICD-10-CM | POA: Diagnosis present

## 2014-01-08 DIAGNOSIS — Z87891 Personal history of nicotine dependence: Secondary | ICD-10-CM

## 2014-01-08 DIAGNOSIS — I5030 Unspecified diastolic (congestive) heart failure: Secondary | ICD-10-CM | POA: Diagnosis present

## 2014-01-08 DIAGNOSIS — J4489 Other specified chronic obstructive pulmonary disease: Secondary | ICD-10-CM | POA: Diagnosis present

## 2014-01-08 DIAGNOSIS — I252 Old myocardial infarction: Secondary | ICD-10-CM

## 2014-01-08 DIAGNOSIS — E785 Hyperlipidemia, unspecified: Secondary | ICD-10-CM | POA: Diagnosis present

## 2014-01-08 DIAGNOSIS — Z7982 Long term (current) use of aspirin: Secondary | ICD-10-CM

## 2014-01-08 DIAGNOSIS — Z8249 Family history of ischemic heart disease and other diseases of the circulatory system: Secondary | ICD-10-CM

## 2014-01-08 DIAGNOSIS — Z7901 Long term (current) use of anticoagulants: Secondary | ICD-10-CM

## 2014-01-08 DIAGNOSIS — I5031 Acute diastolic (congestive) heart failure: Secondary | ICD-10-CM | POA: Diagnosis present

## 2014-01-08 DIAGNOSIS — I129 Hypertensive chronic kidney disease with stage 1 through stage 4 chronic kidney disease, or unspecified chronic kidney disease: Secondary | ICD-10-CM | POA: Diagnosis present

## 2014-01-08 DIAGNOSIS — K219 Gastro-esophageal reflux disease without esophagitis: Secondary | ICD-10-CM | POA: Diagnosis present

## 2014-01-08 DIAGNOSIS — E46 Unspecified protein-calorie malnutrition: Secondary | ICD-10-CM | POA: Diagnosis present

## 2014-01-08 DIAGNOSIS — N400 Enlarged prostate without lower urinary tract symptoms: Secondary | ICD-10-CM | POA: Diagnosis present

## 2014-01-08 DIAGNOSIS — E039 Hypothyroidism, unspecified: Secondary | ICD-10-CM | POA: Diagnosis present

## 2014-01-08 LAB — POCT I-STAT, CHEM 8
BUN: 26 mg/dL — ABNORMAL HIGH (ref 6–23)
CHLORIDE: 96 meq/L (ref 96–112)
Calcium, Ion: 1.13 mmol/L (ref 1.13–1.30)
Creatinine, Ser: 1.4 mg/dL — ABNORMAL HIGH (ref 0.50–1.35)
Glucose, Bld: 97 mg/dL (ref 70–99)
HEMATOCRIT: 44 % (ref 39.0–52.0)
Hemoglobin: 15 g/dL (ref 13.0–17.0)
POTASSIUM: 4 meq/L (ref 3.7–5.3)
Sodium: 135 mEq/L — ABNORMAL LOW (ref 137–147)
TCO2: 28 mmol/L (ref 0–100)

## 2014-01-08 LAB — POCT I-STAT TROPONIN I: TROPONIN I, POC: 0.02 ng/mL (ref 0.00–0.08)

## 2014-01-08 LAB — PROTIME-INR
INR: 1.27 (ref 0.00–1.49)
Prothrombin Time: 15.6 seconds — ABNORMAL HIGH (ref 11.6–15.2)

## 2014-01-08 LAB — PRO B NATRIURETIC PEPTIDE: Pro B Natriuretic peptide (BNP): 1259 pg/mL — ABNORMAL HIGH (ref 0–450)

## 2014-01-08 MED ORDER — DILTIAZEM HCL 100 MG IV SOLR
5.0000 mg/h | INTRAVENOUS | Status: DC
  Administered 2014-01-08: 5 mg/h via INTRAVENOUS
  Filled 2014-01-08: qty 100

## 2014-01-08 MED ORDER — DILTIAZEM LOAD VIA INFUSION
15.0000 mg | Freq: Once | INTRAVENOUS | Status: AC
  Administered 2014-01-08: 15 mg via INTRAVENOUS
  Filled 2014-01-08: qty 15

## 2014-01-08 NOTE — ED Notes (Addendum)
Pt brought to ED by EMS with SOB which started a week ago.Pt with GCS 15 and denies any pain.

## 2014-01-08 NOTE — ED Provider Notes (Signed)
CSN: [id number]     Arrival date & time 01/08/14  2136 History   First MD Initiated Contact with Patient 01/08/14 2158     Chief Complaint  Patient presents with  . Shortness of Breath    HPI  patient presents w patient denies chest pain.  Patient has history of atrial fibrillation and currently takes Cardizem.  He has been taking his medication.  Patient states his shortness of breath is, gradually gotten worse.  Patient denies any fever or chills.ith shortness of breath which started about a week ago. Past Medical History  Diagnosis Date  . CAD (coronary artery disease)   . Hyperlipidemia   . Kidney stones   . BPH (benign prostatic hypertrophy)   . Osteoarthritis   . COPD (chronic obstructive pulmonary disease)   . Hypothyroidism   . Atrial fibrillation   . Impaired fasting glucose   . Myocardial infarction   . Hypertension   . Shortness of breath   . CHF (congestive heart failure)   . GERD (gastroesophageal reflux disease)    Past Surgical History  Procedure Laterality Date  . Inguinal hernia repair  09/2004    left  . Back surgery  1990's    x3  . Transurethral resection of prostate  2002  . Pleural scarification  1980    right  . Esophagogastroduodenoscopy  04/2002    negative  . Bladder stone removal  06/2006  . Colonoscopy Left 04/13/2013    Procedure: COLONOSCOPY;  Surgeon: Willis ModenaWilliam Outlaw, MD;  [facility];  Service: Endoscopy;  Laterality: Left;   Family History  Problem Relation Age of Onset  . Heart disease Mother   . Heart disease Brother   . Stroke Brother   . Cancer Neg Hx   . Diabetes Neg Hx   . Hypertension Neg Hx   . Heart disease Brother   . Stroke Brother   . Heart disease Brother   . Stroke Brother    History  Substance Use Topics  . Smoking status: Former Smoker -- 1.00 packs/day for 60 years    Types: Cigarettes    Quit date: 12/24/2011  . Smokeless tobacco: Never Used  . Alcohol Use: No    Review of Systems All other  systems reviewed and are negative  Allergies  Atorvastatin; Ezetimibe-simvastatin; Fenofibrate; and Penicillins  Home Medications   Current Outpatient Rx  Name  Route  Sig  Dispense  Refill  . amitriptyline (ELAVIL) 25 MG tablet   Oral   Take 25 mg by mouth at bedtime.         Marland Kitchen. aspirin 81 MG tablet   Oral   Take 81 mg by mouth every morning.          . carvedilol (COREG) 3.125 MG tablet      TAKE 1 TABLET BY MOUTH TWICE A DAY WITH A MEAL   60 tablet   5   . diltiazem (CARDIZEM CD) 300 MG 24 hr capsule   Oral   Take 1 capsule (300 mg total) by mouth daily.   30 capsule   0   . fluticasone (FLONASE) 50 MCG/ACT nasal spray   Nasal   Place 2 sprays into the nose daily. 2 spray, Each Nare, Daily   16 g   0   . furosemide (LASIX) 40 MG tablet   Oral   Take 1 tablet (40 mg total) by mouth 2 (two) times daily.   60 tablet   11   .  HYDROcodone-acetaminophen (NORCO) 10-325 MG per tablet      TAKE 1 TABLET BY MOUTH 3 TIMES A DAY AS NEEDED FOR PAIN   90 tablet   0   . ipratropium (ATROVENT HFA) 17 MCG/ACT inhaler   Inhalation   Inhale 2 puffs into the lungs 4 (four) times daily.   3 Inhaler   3   . levothyroxine (SYNTHROID, LEVOTHROID) 25 MCG tablet      TAKE 1 TABLET EVERY DAY   90 tablet   3   . nitroGLYCERIN (NITROSTAT) 0.4 MG SL tablet   Sublingual   Place 0.4 mg under the tongue every 5 (five) minutes as needed for chest pain. x3 doses for for chest pain         . polyethylene glycol (MIRALAX / GLYCOLAX) packet   Oral   Take 17 g by mouth daily as needed for mild constipation.          . potassium chloride SA (K-DUR,KLOR-CON) 20 MEQ tablet   Oral   Take 1 tablet (20 mEq total) by mouth daily.   30 tablet   11   . PROVENTIL HFA 108 (90 BASE) MCG/ACT inhaler      INHALE 2 PUFFS INTO THE LUNGS EVERY 4 HOURS AS NEEDED FOR WHEEZING.   6.7 each   2   . RAPAFLO 8 MG CAPS   Oral   Take 8 mg by mouth daily.          Marland Kitchen warfarin (COUMADIN) 5  MG tablet   Oral   Take 2.5-5 mg by mouth daily. Take 2.5 mg on Tuesday, Thursday, Saturday. Take 5 mg on all other days.          BP 170/91  Pulse 140  Temp(Src) 98.8 F (37.1 C) (Oral)  Resp 23  SpO2 100% Physical Exam  Nursing note and vitals reviewed. Constitutional: He is oriented to person, place, and time. He appears well-developed and well-nourished. No distress.  HENT:  Head: Normocephalic and atraumatic.  Eyes: Pupils are equal, round, and reactive to light.  Neck: Normal range of motion.  Cardiovascular: Normal rate and intact distal pulses.   Pulmonary/Chest: No respiratory distress. He has rhonchi (Scattered).  Abdominal: Normal appearance. He exhibits no distension.  Musculoskeletal: Normal range of motion.  Neurological: He is alert and oriented to person, place, and time. No cranial nerve deficit.  Skin: Skin is warm and dry. No rash noted.  Psychiatric: He has a normal mood and affect. His behavior is normal.    ED Course  Procedures (including critical care time)  Medications  diltiazem (CARDIZEM) 100 mg in dextrose 5 % 100 mL infusion (5 mg/hr Intravenous New Bag/Given 01/08/14 2250)  diltiazem (CARDIZEM) 1 mg/mL load via infusion 15 mg (15 mg Intravenous New Bag/Given 01/08/14 2250)   CRITICAL CARE Performed by: Nelva Nay L Total critical care time: 30 min  Critical care time was exclusive of separately billable procedures and treating other patients. Critical care was necessary to treat or prevent imminent or life-threatening deterioration. Critical care was time spent personally by me on the following activities: development of treatment plan with patient and/or surrogate as well as nursing, discussions with consultants, evaluation of patient's response to treatment, examination of patient, obtaining history from patient or surrogate, ordering and performing treatments and interventions, ordering and review of laboratory studies, ordering and review  of radiographic studies, pulse oximetry and re-evaluation of patient's condition.  Labs Review Labs Reviewed  PROTIME-INR - Abnormal; Notable for the  following:    Prothrombin Time 15.6 (*)    All other components within normal limits  POCT I-STAT, CHEM 8 - Abnormal; Notable for the following:    Sodium 135 (*)    BUN 26 (*)    Creatinine, Ser 1.40 (*)    All other components within normal limits  PRO B NATRIURETIC PEPTIDE  POCT I-STAT TROPONIN I   Imaging Review Dg Chest Portable 1 View  IMPRESSION: No evidence of acute cardiopulmonary disease.  Chronic interstitial markings/emphysematous changes with biapical pleural parenchymal scarring.    Electronically Signed   By: Charline Bills M.D.   On: 01/08/2014 22:26    EKG Interpretation    Date/Time:  Saturday January 08 2014 21:55:13 EST Ventricular Rate:  146 PR Interval:    QRS Duration: 69 QT Interval:  348 QTC Calculation: 542 R Axis:   90 Text Interpretation:  Atrial fibrillation with rapid V-rate Ventricular premature complex Aberrant conduction of SV complex(es) Borderline right axis deviation Repolarization abnormality, prob rate related Confirmed by Monserat Prestigiacomo  MD, Ensley Blas ([id number]) on 01/08/2014 10:32:35 PM           I discussed the case with cardiology who will come and admit the patient.  MDM   1. Atrial fibrillation with RVR        Nelia Shi, MD 01/08/14 2310

## 2014-01-09 DIAGNOSIS — I4891 Unspecified atrial fibrillation: Principal | ICD-10-CM

## 2014-01-09 DIAGNOSIS — J449 Chronic obstructive pulmonary disease, unspecified: Secondary | ICD-10-CM

## 2014-01-09 DIAGNOSIS — I5030 Unspecified diastolic (congestive) heart failure: Secondary | ICD-10-CM

## 2014-01-09 LAB — BASIC METABOLIC PANEL
BUN: 22 mg/dL (ref 6–23)
CALCIUM: 9 mg/dL (ref 8.4–10.5)
CO2: 25 meq/L (ref 19–32)
CREATININE: 1.18 mg/dL (ref 0.50–1.35)
Chloride: 95 mEq/L — ABNORMAL LOW (ref 96–112)
GFR calc Af Amer: 64 mL/min — ABNORMAL LOW (ref >90)
GFR calc non Af Amer: 55 mL/min — ABNORMAL LOW (ref >90)
Glucose, Bld: 162 mg/dL — ABNORMAL HIGH (ref 70–99)
Potassium: 3.6 mEq/L — ABNORMAL LOW (ref 3.7–5.3)
Sodium: 136 mEq/L — ABNORMAL LOW (ref 137–147)

## 2014-01-09 LAB — CBC
HEMATOCRIT: 38.9 % — AB (ref 39.0–52.0)
Hemoglobin: 13 g/dL (ref 13.0–17.0)
MCH: 30.6 pg (ref 26.0–34.0)
MCHC: 33.4 g/dL (ref 30.0–36.0)
MCV: 91.5 fL (ref 78.0–100.0)
Platelets: 151 10*3/uL (ref 150–400)
RBC: 4.25 MIL/uL (ref 4.22–5.81)
RDW: 13.9 % (ref 11.5–15.5)
WBC: 9.5 10*3/uL (ref 4.0–10.5)

## 2014-01-09 LAB — LIPID PANEL
Cholesterol: 185 mg/dL (ref 0–200)
HDL: 46 mg/dL (ref >39)
LDL Cholesterol: 117 mg/dL — ABNORMAL HIGH (ref 0–99)
Total CHOL/HDL Ratio: 4 RATIO
Triglycerides: 110 mg/dL (ref <150)
VLDL: 22 mg/dL (ref 0–40)

## 2014-01-09 LAB — PROTIME-INR
INR: 1.23 (ref 0.00–1.49)
Prothrombin Time: 15.2 seconds (ref 11.6–15.2)

## 2014-01-09 LAB — TSH: TSH: 8.916 u[IU]/mL — ABNORMAL HIGH (ref 0.350–4.500)

## 2014-01-09 MED ORDER — ONDANSETRON HCL 4 MG/2ML IJ SOLN: 4.0000 mg | Freq: Four times a day (QID) | INTRAMUSCULAR | Status: DC | PRN

## 2014-01-09 MED ORDER — IPRATROPIUM BROMIDE 0.02 % IN SOLN
0.5000 mg | Freq: Three times a day (TID) | RESPIRATORY_TRACT | Status: DC
  Administered 2014-01-10 – 2014-01-12 (×7): 0.5 mg via RESPIRATORY_TRACT
  Filled 2014-01-09 (×6): qty 2.5

## 2014-01-09 MED ORDER — WARFARIN VIDEO
Freq: Once | Status: AC
  Administered 2014-01-09: 12:00:00

## 2014-01-09 MED ORDER — ASPIRIN 81 MG PO CHEW
81.0000 mg | CHEWABLE_TABLET | Freq: Every morning | ORAL | Status: DC
  Administered 2014-01-09: 81 mg via ORAL

## 2014-01-09 MED ORDER — DILTIAZEM HCL ER COATED BEADS 300 MG PO CP24
300.0000 mg | ORAL_CAPSULE | Freq: Every day | ORAL | Status: DC
  Administered 2014-01-09 – 2014-01-12 (×4): 300 mg via ORAL
  Filled 2014-01-09 (×4): qty 1

## 2014-01-09 MED ORDER — FUROSEMIDE 40 MG PO TABS
40.0000 mg | ORAL_TABLET | Freq: Every day | ORAL | Status: DC
  Administered 2014-01-09 – 2014-01-12 (×4): 40 mg via ORAL
  Filled 2014-01-09 (×4): qty 1

## 2014-01-09 MED ORDER — POTASSIUM CHLORIDE CRYS ER 20 MEQ PO TBCR
20.0000 meq | EXTENDED_RELEASE_TABLET | Freq: Every day | ORAL | Status: DC
  Administered 2014-01-09 – 2014-01-12 (×4): 20 meq via ORAL
  Filled 2014-01-09 (×3): qty 1

## 2014-01-09 MED ORDER — WARFARIN SODIUM 4 MG PO TABS
8.0000 mg | ORAL_TABLET | Freq: Once | ORAL | Status: AC
  Administered 2014-01-09: 8 mg via ORAL
  Filled 2014-01-09: qty 2

## 2014-01-09 MED ORDER — AMITRIPTYLINE HCL 25 MG PO TABS
25.0000 mg | ORAL_TABLET | Freq: Every day | ORAL | Status: DC
  Administered 2014-01-09 – 2014-01-11 (×3): 25 mg via ORAL
  Filled 2014-01-09 (×4): qty 1

## 2014-01-09 MED ORDER — IPRATROPIUM BROMIDE 0.02 % IN SOLN
0.5000 mg | RESPIRATORY_TRACT | Status: DC
  Administered 2014-01-09 (×3): 0.5 mg via RESPIRATORY_TRACT
  Filled 2014-01-09 (×3): qty 2.5

## 2014-01-09 MED ORDER — COUMADIN BOOK
Freq: Once | Status: AC
  Administered 2014-01-09: 12:00:00
  Filled 2014-01-09: qty 1

## 2014-01-09 MED ORDER — CARVEDILOL 3.125 MG PO TABS
3.1250 mg | ORAL_TABLET | Freq: Two times a day (BID) | ORAL | Status: DC
  Administered 2014-01-09 – 2014-01-12 (×6): 3.125 mg via ORAL
  Filled 2014-01-09 (×9): qty 1

## 2014-01-09 MED ORDER — LEVOTHYROXINE SODIUM 25 MCG PO TABS
25.0000 ug | ORAL_TABLET | Freq: Every day | ORAL | Status: DC
  Administered 2014-01-09 – 2014-01-12 (×4): 25 ug via ORAL
  Filled 2014-01-09 (×5): qty 1

## 2014-01-09 MED ORDER — WARFARIN - PHARMACIST DOSING INPATIENT
Freq: Every day | Status: DC
  Administered 2014-01-10: 18:00:00

## 2014-01-09 MED ORDER — WARFARIN SODIUM 5 MG PO TABS: 5.0000 mg | ORAL_TABLET | Freq: Every day | ORAL | Status: DC

## 2014-01-09 NOTE — Progress Notes (Signed)
Pt converted to NSR at 11:30 this morning.  HR is now 70's NSR.  Cardizem drip was decreased to 10 ml/hr.  Will continue to monitor.

## 2014-01-09 NOTE — Progress Notes (Signed)
ANTICOAGULATION CONSULT NOTE - Initial Consult  Pharmacy Consult for Coumadin Indication: atrial fibrillation  Allergies  Allergen Reactions  . Atorvastatin Other (See Comments)    REACTION: severe muscle aches  . Ezetimibe-Simvastatin Other (See Comments)    REACTION: severe muscle aches  . Fenofibrate     REACTION: "ran me up a wall"  . Penicillins Other (See Comments)    blisters    Patient Measurements: Height: 5\' 7"  (170.2 cm) Weight: 117 lb 4.8 oz (53.207 kg) IBW/kg (Calculated) : 66.1  Vital Signs: Temp: 98.8 F (37.1 C) (01/17 2152) Temp src: Oral (01/17 2152) BP: 138/54 mmHg (01/18 0400) Pulse Rate: 78 (01/18 0400)  Labs:  Recent Labs  01/08/14 2225 01/08/14 2234  HGB  --  15.0  HCT  --  44.0  LABPROT 15.6*  --   INR 1.27  --   CREATININE  --  1.40*    Estimated Creatinine Clearance: 30.1 ml/min (by C-G formula based on Cr of 1.4).   Medical History: Past Medical History  Diagnosis Date  . CAD (coronary artery disease)   . Hyperlipidemia   . Kidney stones   . BPH (benign prostatic hypertrophy)   . Osteoarthritis   . COPD (chronic obstructive pulmonary disease)   . Hypothyroidism   . Atrial fibrillation   . Impaired fasting glucose   . Myocardial infarction   . Hypertension   . Shortness of breath   . CHF (congestive heart failure)   . GERD (gastroesophageal reflux disease)     Medications:  Prescriptions prior to admission  Medication Sig Dispense Refill  . amitriptyline (ELAVIL) 25 MG tablet Take 25 mg by mouth at bedtime.      Marland Kitchen aspirin 81 MG tablet Take 81 mg by mouth every morning.       . carvedilol (COREG) 3.125 MG tablet TAKE 1 TABLET BY MOUTH TWICE A DAY WITH A MEAL  60 tablet  5  . diltiazem (CARDIZEM CD) 300 MG 24 hr capsule Take 1 capsule (300 mg total) by mouth daily.  30 capsule  0  . fluticasone (FLONASE) 50 MCG/ACT nasal spray Place 2 sprays into the nose daily. 2 spray, Each Nare, Daily  16 g  0  . furosemide (LASIX) 40  MG tablet Take 1 tablet (40 mg total) by mouth 2 (two) times daily.  60 tablet  11  . HYDROcodone-acetaminophen (NORCO) 10-325 MG per tablet TAKE 1 TABLET BY MOUTH 3 TIMES A DAY AS NEEDED FOR PAIN  90 tablet  0  . ipratropium (ATROVENT HFA) 17 MCG/ACT inhaler Inhale 2 puffs into the lungs 4 (four) times daily.  3 Inhaler  3  . levothyroxine (SYNTHROID, LEVOTHROID) 25 MCG tablet TAKE 1 TABLET EVERY DAY  90 tablet  3  . nitroGLYCERIN (NITROSTAT) 0.4 MG SL tablet Place 0.4 mg under the tongue every 5 (five) minutes as needed for chest pain. x3 doses for for chest pain      . polyethylene glycol (MIRALAX / GLYCOLAX) packet Take 17 g by mouth daily as needed for mild constipation.       . potassium chloride SA (K-DUR,KLOR-CON) 20 MEQ tablet Take 1 tablet (20 mEq total) by mouth daily.  30 tablet  11  . PROVENTIL HFA 108 (90 BASE) MCG/ACT inhaler INHALE 2 PUFFS INTO THE LUNGS EVERY 4 HOURS AS NEEDED FOR WHEEZING.  6.7 each  2  . RAPAFLO 8 MG CAPS Take 8 mg by mouth daily.       Marland Kitchen warfarin (  COUMADIN) 5 MG tablet Take 2.5-5 mg by mouth daily. Take 2.5 mg on Tuesday, Thursday, Saturday. Take 5 mg on all other days.       Scheduled:  . amitriptyline  25 mg Oral QHS  . aspirin  81 mg Oral q morning - 10a  . carvedilol  3.125 mg Oral BID WC  . diltiazem  300 mg Oral Daily  . furosemide  40 mg Oral Daily  . ipratropium  0.5 mg Nebulization Q4H  . levothyroxine  25 mcg Oral QAC breakfast  . potassium chloride SA  20 mEq Oral Daily   Infusions:  . diltiazem (CARDIZEM) infusion 5 mg/hr (01/09/14 0402)    Assessment: 80yo male presents to ED w/ sudden onset of heart palpitation and SOB, found to be in Afib w/ RVR to 150s, has chronic Afib on Coumadin, to continue Coumadin during admission; INR currently subtherapeutic, spoke w/ cards fellow who relates that heparin bridge currently unnecessary in this pt.  Goal of Therapy:  INR 2-3   Plan:  Will give boosted Coumadin dose of 8mg  po x1 today and  monitor INR for dose adjustments; noted that last two visits at outpt anticoag clinic has had INR of 3.6 and 4.6.  Vernard GamblesVeronda Maricarmen Braziel, PharmD, BCPS  01/09/2014,5:33 AM

## 2014-01-09 NOTE — H&P (Signed)
Physician History and Physical    [name] GowdaDanah Holland MRN: [id number] DOB/AGE: 1928/05/31 80 y.o. Admit date: 01/08/2014   Primary Cardiologist:  Ellwood HandlerWilliam Tilley  CC:  SOB and heart racing  HPI:  [age] yo male with h/o CAD s/p PCI to LCx, COPD, chronic Afib, diastolic heart failure who presented to ER with sudden onset heart palpitation and SOB, he was found to be in Afib with RVR with rate of 150s. Patient states he took his meds regularly. He denied any PND/orthopanea or chest pain or syncope. In ER, cardizem drip started and his HR controlled to 100s, his SOB symptms since resolved. His Creatinine was found to be slightly above baseline.   Review of systems: A review of 10 organ systems was done and is negative except as stated above in HPI  Past Medical History  Diagnosis Date  . CAD (coronary artery disease)   . Hyperlipidemia   . Kidney stones   . BPH (benign prostatic hypertrophy)   . Osteoarthritis   . COPD (chronic obstructive pulmonary disease)   . Hypothyroidism   . Atrial fibrillation   . Impaired fasting glucose   . Myocardial infarction   . Hypertension   . Shortness of breath   . CHF (congestive heart failure)   . GERD (gastroesophageal reflux disease)    Past Surgical History  Procedure Laterality Date  . Inguinal hernia repair  09/2004    left  . Back surgery  1990's    x3  . Transurethral resection of prostate  2002  . Pleural scarification  1980    right  . Esophagogastroduodenoscopy  04/2002    negative  . Bladder stone removal  06/2006  . Colonoscopy Left 04/13/2013    Procedure: COLONOSCOPY;  Surgeon: Willis ModenaWilliam Outlaw, MD;  [facility];  Service: Endoscopy;  Laterality: Left;   History   Social History  . Marital Status: Married    Spouse Name: N/A    Number of Children: 1  . Years of Education: N/A   Occupational History  . retired Therapist, [employer]    Social History Main Topics  . Smoking status: Former Smoker -- 1.00 packs/day for 60  years    Types: Cigarettes    Quit date: 12/24/2011  . Smokeless tobacco: Never Used  . Alcohol Use: No  . Drug Use: No  . Sexual Activity: Not on file   Other Topics Concern  . Not on file   Social History Narrative   Does lots of yard work and a big garden   Has living will   No [facility] health care POA. Asks for daughter Ardeen FillersLisa Bray to make decisions if needed.   Discussed DNR--- he doesn't want artificial ventilation but isn't clear about this.   Not sure about tube feeds    Family History  Problem Relation Age of Onset  . Heart disease Mother   . Heart disease Brother   . Stroke Brother   . Cancer Neg Hx   . Diabetes Neg Hx   . Hypertension Neg Hx   . Heart disease Brother   . Stroke Brother   . Heart disease Brother   . Stroke Brother      Allergies  Allergen Reactions  . Atorvastatin Other (See Comments)    REACTION: severe muscle aches  . Ezetimibe-Simvastatin Other (See Comments)    REACTION: severe muscle aches  . Fenofibrate     REACTION: "ran me up a wall"  . Penicillins Other (See Comments)  blisters    Current Facility-Administered Medications  Medication Dose Route Frequency Provider Last Rate Last Dose  . diltiazem (CARDIZEM) 100 mg in dextrose 5 % 100 mL infusion  5-15 mg/hr Intravenous Continuous Nelia Shi, MD 10 mL/hr at 01/09/14 0144 10 mg/hr at 01/09/14 0144   Current Outpatient Prescriptions  Medication Sig Dispense Refill  . amitriptyline (ELAVIL) 25 MG tablet Take 25 mg by mouth at bedtime.      Marland Kitchen aspirin 81 MG tablet Take 81 mg by mouth every morning.       . carvedilol (COREG) 3.125 MG tablet TAKE 1 TABLET BY MOUTH TWICE A DAY WITH A MEAL  60 tablet  5  . diltiazem (CARDIZEM CD) 300 MG 24 hr capsule Take 1 capsule (300 mg total) by mouth daily.  30 capsule  0  . fluticasone (FLONASE) 50 MCG/ACT nasal spray Place 2 sprays into the nose daily. 2 spray, Each Nare, Daily  16 g  0  . furosemide (LASIX) 40 MG tablet Take 1 tablet (40 mg  total) by mouth 2 (two) times daily.  60 tablet  11  . HYDROcodone-acetaminophen (NORCO) 10-325 MG per tablet TAKE 1 TABLET BY MOUTH 3 TIMES A DAY AS NEEDED FOR PAIN  90 tablet  0  . ipratropium (ATROVENT HFA) 17 MCG/ACT inhaler Inhale 2 puffs into the lungs 4 (four) times daily.  3 Inhaler  3  . levothyroxine (SYNTHROID, LEVOTHROID) 25 MCG tablet TAKE 1 TABLET EVERY DAY  90 tablet  3  . nitroGLYCERIN (NITROSTAT) 0.4 MG SL tablet Place 0.4 mg under the tongue every 5 (five) minutes as needed for chest pain. x3 doses for for chest pain      . polyethylene glycol (MIRALAX / GLYCOLAX) packet Take 17 g by mouth daily as needed for mild constipation.       . potassium chloride SA (K-DUR,KLOR-CON) 20 MEQ tablet Take 1 tablet (20 mEq total) by mouth daily.  30 tablet  11  . PROVENTIL HFA 108 (90 BASE) MCG/ACT inhaler INHALE 2 PUFFS INTO THE LUNGS EVERY 4 HOURS AS NEEDED FOR WHEEZING.  6.7 each  2  . RAPAFLO 8 MG CAPS Take 8 mg by mouth daily.       Marland Kitchen warfarin (COUMADIN) 5 MG tablet Take 2.5-5 mg by mouth daily. Take 2.5 mg on Tuesday, Thursday, Saturday. Take 5 mg on all other days.        Physical Exam: Blood pressure 135/70, pulse 70, temperature 98.8 F (37.1 C), temperature source Oral, resp. rate 18, SpO2 99.00%.; There is no weight on file to calculate BMI. Temp:  [98.8 F (37.1 C)] 98.8 F (37.1 C) (01/17 2152) Pulse Rate:  [47-143] 70 (01/18 0215) Resp:  [14-28] 18 (01/18 0215) BP: (127-170)/(62-99) 135/70 mmHg (01/18 0200) SpO2:  [90 %-100 %] 99 % (01/18 0200)  No intake or output data in the 24 hours ending 01/09/14 0246 General: NAD Heent: MMM Neck: No JVD  CV: Nondisplaced PMI.  RRR, nl S1/S2, no S3/S4, no murmur. No carotid bruit   Lungs: mild wheezes bilaterally Abdomen: Soft, nontender, nondistended Extremities: No clubbing or cyanosis.  Normal pedal pulses. No pedal edema Skin: Intact without lesions or rashes  Neurologic: Alert and oriented x 3, grossly nonfocal  Psych:  Normal mood and affect    Labs: No results found for this basename: CKTOTAL, CKMB, TROPONINI,  in the last 72 hours Lab Results  Component Value Date   WBC 7.3 04/16/2013   HGB 15.0 01/08/2014  HCT 44.0 01/08/2014   MCV 84.8 04/16/2013   PLT 280 04/16/2013    Recent Labs Lab 01/08/14 2234  NA 135*  K 4.0  CL 96  BUN 26*  CREATININE 1.40*  GLUCOSE 97   Lab Results  Component Value Date   CHOL 158 02/02/2013   HDL 65.60 02/02/2013   LDLCALC 65 02/02/2013   TRIG 137.0 02/02/2013       EKG:  Afib with RVR , with rate of 146 Echo:  02/2013, EF 45-50%, PA 35 mmhg.  Radiology:  Dg Chest Portable 1 View  01/08/2014   CLINICAL DATA:  Shortness of breath, chronic cough, congestion  EXAM: PORTABLE CHEST - 1 VIEW  COMPARISON:  07/30/2013  FINDINGS: Chronic interstitial markings/ emphysematous changes. Biapical pleural parenchymal scarring. No focal consolidation. No pleural effusion or pneumothorax.  Heart is normal in size.  IMPRESSION: No evidence of acute cardiopulmonary disease.  Chronic interstitial markings/emphysematous changes with biapical pleural parenchymal scarring.   Electronically Signed   By: Charline Bills M.D.   On: 01/08/2014 22:26    ASSESSMENT:  80 yo male with h/o CAD s/p PCI to LCx, COPD, chronic Afib, diastolic heart failure who is admitted for Afib with RVR and AKI on CKD.  1. Chronic Afib with acute exacerbation 2. Diastolic HF with no evidence of exacerbation 3. Chronic anticoagulation with subtherapeutic INR 4. H/o hypothyroidism with LT4 supplement 5. COPD, stable.   PLAN:  1. Continue Cardizem drip for better rate control, switch to PO in am after rate controlled, will likely need to increase PO dose on discharge 2.  Increase Warfarin to 5 mg daily for 5 days a week and 2.5 mg 2 days a week 3. Overall fluid status look on dry side, with baseline proBNP and mild AKI, will decrease Lasix from 40 bid to 40 mg daily 4. Check TSH, to rule out  oversupplementation as contributing factor for Afib with RVR.  5. Atrovent nebulizer q 4hr, will avoid home albuterol due to RVR.   Signed: Haydee Salter, MD Cardiology Fellow 01/09/2014, 2:46 AM

## 2014-01-09 NOTE — Progress Notes (Signed)
One hour following discontinuation of Cardizem drip, pt HR maintains NSR at 60-70 bpm.

## 2014-01-09 NOTE — Progress Notes (Signed)
Utilization review completed.  

## 2014-01-09 NOTE — Progress Notes (Signed)
Subjective:  Still weak but feeling better today. Converted to sinus rhythm overnight.  Objective:  Vital Signs in the last 24 hours: BP 129/64  Pulse 98  Temp(Src) 98.2 F (36.8 C) (Oral)  Resp 19  Ht 5\' 7"  (1.702 m)  Wt 53.207 kg (117 lb 4.8 oz)  BMI 18.37 kg/m2  SpO2 97%  Physical Exam: Pleasant white male in no acute distress Lungs:  Reduced breath sounds Cardiac:  Regular rhythm, normal S1 and S2, no S3, 1/6 systolic murmur Abdomen:  Soft, nontender, no masses Extremities:  No edema present  Intake/Output from previous day:    Weight Filed Weights   01/09/14 0459  Weight: 53.207 kg (117 lb 4.8 oz)    Lab Results: Basic Metabolic Panel:  Recent Labs  96/03/5400/17/15 2234 01/09/14 0651  NA 135* 136*  K 4.0 3.6*  CL 96 95*  CO2  --  25  GLUCOSE 97 162*  BUN 26* 22  CREATININE 1.40* 1.18   CBC:  Recent Labs  01/08/14 2234 01/09/14 0651  WBC  --  9.5  HGB 15.0 13.0  HCT 44.0 38.9*  MCV  --  91.5  PLT  --  151   Cardiac Enzymes: Troponin (Point of Care Test)  Recent Labs  01/08/14 2231  TROPIPOC 0.02    Telemetry: Currently normal sinus rhythm  Assessment/Plan:  1. Paroxysmal atrial fibrillation resolved 2. Long-term anticoagulation with warfarin 3. Dyspnea due to underlying COPD  Recommendations:  Changed to oral diltiazem again.     Darden PalmerW. Spencer Tilley, Jr.  MD [facility]  01/09/2014, 12:22 PM

## 2014-01-10 LAB — HEPARIN LEVEL (UNFRACTIONATED): HEPARIN UNFRACTIONATED: 0.25 [IU]/mL — AB (ref 0.30–0.70)

## 2014-01-10 LAB — PROTIME-INR
INR: 1.13 (ref 0.00–1.49)
Prothrombin Time: 14.3 seconds (ref 11.6–15.2)

## 2014-01-10 MED ORDER — LOPERAMIDE HCL 2 MG PO CAPS
4.0000 mg | ORAL_CAPSULE | ORAL | Status: DC | PRN
  Administered 2014-01-10: 4 mg via ORAL
  Filled 2014-01-10: qty 2

## 2014-01-10 MED ORDER — AMIODARONE HCL 200 MG PO TABS
200.0000 mg | ORAL_TABLET | Freq: Every day | ORAL | Status: DC
  Administered 2014-01-10 – 2014-01-12 (×3): 200 mg via ORAL
  Filled 2014-01-10 (×3): qty 1

## 2014-01-10 MED ORDER — FLUTICASONE PROPIONATE 50 MCG/ACT NA SUSP
1.0000 | Freq: Every day | NASAL | Status: DC
  Administered 2014-01-10 – 2014-01-12 (×3): 1 via NASAL
  Filled 2014-01-10: qty 16

## 2014-01-10 MED ORDER — HEPARIN (PORCINE) IN NACL 100-0.45 UNIT/ML-% IJ SOLN
800.0000 [IU]/h | INTRAMUSCULAR | Status: DC
  Administered 2014-01-10: 800 [IU]/h via INTRAVENOUS
  Filled 2014-01-10 (×2): qty 250

## 2014-01-10 MED ORDER — HEPARIN (PORCINE) IN NACL 100-0.45 UNIT/ML-% IJ SOLN
900.0000 [IU]/h | INTRAMUSCULAR | Status: DC
  Administered 2014-01-11: 900 [IU]/h via INTRAVENOUS
  Filled 2014-01-10 (×4): qty 250

## 2014-01-10 MED ORDER — HEPARIN BOLUS VIA INFUSION
2000.0000 [IU] | Freq: Once | INTRAVENOUS | Status: AC
  Administered 2014-01-10: 2000 [IU] via INTRAVENOUS
  Filled 2014-01-10: qty 2000

## 2014-01-10 MED ORDER — WARFARIN SODIUM 4 MG PO TABS
8.0000 mg | ORAL_TABLET | Freq: Once | ORAL | Status: AC
  Administered 2014-01-10: 8 mg via ORAL
  Filled 2014-01-10: qty 2

## 2014-01-10 NOTE — Progress Notes (Signed)
INITIAL NUTRITION ASSESSMENT  DOCUMENTATION CODES Per approved criteria  -Severe malnutrition in the context of chronic illness -Underweight   INTERVENTION: No nutrition intervention at this time --- patient declined RD to follow for nutrition care plan  NUTRITION DIAGNOSIS: Inadequate oral intake related to poor appetite as evidenced by patient report  Goal: Pt to meet >/= 90% of their estimated nutrition needs   Monitor:  PO & supplemental intake, weight, labs, I/O's  Reason for Assessment: Malnutrition Screening Tool Report  80 y.o. male  Admitting Dx: shortness of breath  ASSESSMENT: Patient with PMH of CAD, COPD, chronic Afib, diastolic heart failure who presented to ER with sudden onset heart palpitation and SOB, he was found to be in Afib with RVR with rate of 150s.  Patient reports his appetite is poor.  Doesn't really care for the hospital food.  Complaining he didn't get his mashed potatoes on his lunch tray.  States he wasn't eating well at home.  Wife at bedside and reports patient eats 3 meals per day, however, his intake is suboptimal.    PO intake at 50-75% per flowsheet records.  Patient endorses weight loss; per weight readings, patient has had a 7% weight loss since November 2014. Declined addition of nutrition supplements at this time.  Nutrition Focused Physical Exam:  Subcutaneous Fat:  Orbital Region: moderate depletion Upper Arm Region: moderate depletion Thoracic and Lumbar Region: N/A  Muscle:  Temple Region: moderate depletion Clavicle Bone Region: severe depletion Clavicle and Acromion Bone Region: severe depletion Scapular Bone Region: N/A Dorsal Hand: N/A Patellar Region: N/A Anterior Thigh Region: N/A Posterior Calf Region: N/A  Edema: none  Patient meets criteria for severe malnutrition in the context of chronic illness as evidenced by < 75% intake of estimated energy requirement for > 1 month, 7% weight loss in < 3 months and severe  muscle loss.  Height: Ht Readings from Last 1 Encounters:  01/09/14 5\' 7"  (1.702 m)    Weight: Wt Readings from Last 1 Encounters:  01/10/14 113 lb 15.7 oz (51.7 kg)    Ideal Body Weight: 148 lb  % Ideal Body Weight: 76%  Wt Readings from Last 15 Encounters:  01/10/14 113 lb 15.7 oz (51.7 kg)  11/02/13 122 lb (55.339 kg)  07/02/13 124 lb (56.246 kg)  06/01/13 122 lb (55.339 kg)  04/22/13 122 lb (55.339 kg)  04/16/13 117 lb (53.071 kg)  04/16/13 117 lb (53.071 kg)  03/11/13 128 lb (58.06 kg)  02/25/13 122 lb 8 oz (55.566 kg)  02/02/13 135 lb (61.236 kg)  01/27/13 129 lb 12 oz (58.854 kg)  07/22/12 122 lb 8 oz (55.566 kg)  07/21/12 120 lb 8 oz (54.658 kg)  03/10/12 130 lb (58.968 kg)  01/21/12 128 lb (58.06 kg)    Usual Body Weight: 122 lb  % Usual Body Weight: 93%  BMI:  Body mass index is 17.85 kg/(m^2).  Estimated Nutritional Needs: Kcal: 1300-1500 Protein: 60-70 gm Fluid: >/= 1.5 L  Skin: Intact  Diet Order: Cardiac  EDUCATION NEEDS: -No education needs identified at this time   Intake/Output Summary (Last 24 hours) at 01/10/14 1322 Last data filed at 01/10/14 0900  Gross per 24 hour  Intake    240 ml  Output    225 ml  Net     15 ml    Labs:   Recent Labs Lab 01/08/14 2234 01/09/14 0651  NA 135* 136*  K 4.0 3.6*  CL 96 95*  CO2  --  25  BUN 26* 22  CREATININE 1.40* 1.18  CALCIUM  --  9.0  GLUCOSE 97 162*    Scheduled Meds: . amiodarone  200 mg Oral Daily  . amitriptyline  25 mg Oral QHS  . carvedilol  3.125 mg Oral BID WC  . diltiazem  300 mg Oral Daily  . furosemide  40 mg Oral Daily  . ipratropium  0.5 mg Nebulization TID  . levothyroxine  25 mcg Oral QAC breakfast  . potassium chloride SA  20 mEq Oral Daily  . warfarin  8 mg Oral ONCE-1800  . Warfarin - Pharmacist Dosing Inpatient   Does not apply q1800    Continuous Infusions: . heparin 800 Units/hr (01/10/14 1105)    Past Medical History  Diagnosis Date  . CAD  (coronary artery disease)   . Hyperlipidemia   . Kidney stones   . BPH (benign prostatic hypertrophy)   . Osteoarthritis   . COPD (chronic obstructive pulmonary disease)   . Hypothyroidism   . Atrial fibrillation   . Impaired fasting glucose   . Myocardial infarction   . Hypertension   . Shortness of breath   . CHF (congestive heart failure)   . GERD (gastroesophageal reflux disease)     Past Surgical History  Procedure Laterality Date  . Inguinal hernia repair  09/2004    left  . Back surgery  1990's    x3  . Transurethral resection of prostate  2002  . Pleural scarification  1980    right  . Esophagogastroduodenoscopy  04/2002    negative  . Bladder stone removal  06/2006  . Colonoscopy Left 04/13/2013    Procedure: COLONOSCOPY;  Surgeon: Willis ModenaWilliam Outlaw, MD;  [facility];  Service: Endoscopy;  Laterality: Left;    Maureen ChattersKatie Jaelon Gatley, RD, LDN Pager #: [phone] After-Hours Pager #: [phone]

## 2014-01-10 NOTE — Progress Notes (Signed)
ANTICOAGULATION CONSULT NOTE   Pharmacy Consult for Heparin Indication: atrial fibrillation  Allergies  Allergen Reactions  . Atorvastatin Other (See Comments)    REACTION: severe muscle aches  . Ezetimibe-Simvastatin Other (See Comments)    REACTION: severe muscle aches  . Fenofibrate     REACTION: "ran me up a wall"  . Penicillins Other (See Comments)    blisters    Labs:  Recent Labs  01/08/14 2225 01/08/14 2234 01/09/14 0651 01/10/14 0355 01/10/14 1830  HGB  --  15.0 13.0  --   --   HCT  --  44.0 38.9*  --   --   PLT  --   --  151  --   --   LABPROT 15.6*  --  15.2 14.3  --   INR 1.27  --  1.23 1.13  --   HEPARINUNFRC  --   --   --   --  0.25*  CREATININE  --  1.40* 1.18  --   --     Estimated Creatinine Clearance: 34.7 ml/min (by C-G formula based on Cr of 1.18).   Assessment: 80yo male presents to ED w/ sudden onset of heart palpitation and SOB, found to be in Afib w/ RVR to 150s, has chronic Afib on Coumadin, to continue Coumadin during admission; INR currently subtherapeutic, Spoke w/ cards fellow who relates that heparin bridge currently unnecessary in this pt.  INR still low, adding heparin bridge this AM  Initial heparin level low on 800 units/hr.  No trouble with IV infusion per RN.  No bleeding or complications noted per chart notes.  Goal of Therapy:  INR 2-3   Plan:  1) Increase IV heparin to 900 units/hr. 2) Recheck heparin level and CBC with AM labs.  Tad MooreJessica Hartwell Vandiver, Pharm D, BCPS  Clinical Pharmacist Pager [phone]  01/10/2014 7:55 PM

## 2014-01-10 NOTE — Progress Notes (Signed)
  Echocardiogram 2D Echocardiogram has been performed.  [name] [name] 01/10/2014, 3:59 PM

## 2014-01-10 NOTE — Progress Notes (Addendum)
ANTICOAGULATION CONSULT NOTE   Pharmacy Consult for Coumadin / Heparin Indication: atrial fibrillation  Allergies  Allergen Reactions  . Atorvastatin Other (See Comments)    REACTION: severe muscle aches  . Ezetimibe-Simvastatin Other (See Comments)    REACTION: severe muscle aches  . Fenofibrate     REACTION: "ran me up a wall"  . Penicillins Other (See Comments)    blisters    Labs:  Recent Labs  01/08/14 2225 01/08/14 2234 01/09/14 0651 01/10/14 0355  HGB  --  15.0 13.0  --   HCT  --  44.0 38.9*  --   PLT  --   --  151  --   LABPROT 15.6*  --  15.2 14.3  INR 1.27  --  1.23 1.13  CREATININE  --  1.40* 1.18  --     Estimated Creatinine Clearance: 34.7 ml/min (by C-G formula based on Cr of 1.18).   Assessment: 80yo male presents to ED w/ sudden onset of heart palpitation and SOB, found to be in Afib w/ RVR to 150s, has chronic Afib on Coumadin, to continue Coumadin during admission; INR currently subtherapeutic, Spoke w/ cards fellow who relates that heparin bridge currently unnecessary in this pt.  INR still low, adding heparin bridge this AM  Goal of Therapy:  INR 2-3   Plan:  1) Repeat Coumadin 8 mg po x 1 2) Heparin 2000 units iv bolus x 1 3) Heparin drip at 800 units / hr 4) Heparin level 8 hours after heparin begins 5) Daily heparin level, CBC   Thank you. Okey RegalLisa Miken Stecher, PharmD [phone]   01/10/2014,9:26 AM

## 2014-01-10 NOTE — Progress Notes (Signed)
Subjective:  Back in atrial fib around 8:30 am.  Moderately fatigued.  INR is sub optimal.    Objective:  Vital Signs in the last 24 hours: BP 133/64  Pulse 83  Temp(Src) 98.5 F (36.9 C) (Oral)  Resp 21  Ht 5\' 7"  (1.702 m)  Wt 51.7 kg (113 lb 15.7 oz)  BMI 17.85 kg/m2  SpO2 96%  Physical Exam: Pleasant white male in no acute distress Lungs:  Reduced breath sounds Cardiac:  irregular rhythm, normal S1 and S2, no S3, 1/6 systolic murmur Abdomen:  Soft, nontender, no masses Extremities:  No edema present  Intake/Output from previous day: 01/18 0701 - 01/19 0700 In: 480 [P.O.:480] Out: 526 [Urine:525; Stool:1]  Weight Filed Weights   01/09/14 0459 01/10/14 0430  Weight: 53.207 kg (117 lb 4.8 oz) 51.7 kg (113 lb 15.7 oz)    Lab Results: Basic Metabolic Panel:  Recent Labs  16/09/9600/17/15 2234 01/09/14 0651  NA 135* 136*  K 4.0 3.6*  CL 96 95*  CO2  --  25  GLUCOSE 97 162*  BUN 26* 22  CREATININE 1.40* 1.18   CBC:  Recent Labs  01/08/14 2234 01/09/14 0651  WBC  --  9.5  HGB 15.0 13.0  HCT 44.0 38.9*  MCV  --  91.5  PLT  --  151   Cardiac Enzymes: Troponin (Point of Care Test)  Recent Labs  01/08/14 2231  TROPIPOC 0.02   . Lab Results  Component Value Date   INR 1.13 01/10/2014   INR 1.23 01/09/2014   INR 1.27 01/08/2014    Telemetry: Recurrent atrial fib  Rate a little up  Assessment/Plan:  1. Paroxysmal atrial fibrillation recurrent 2. Long-term anticoagulation with warfarin - subtherapeutic 3. Dyspnea due to underlying COPD  Recommendations:  I think he feels better in sinus.  He was in sinus until this am.  INR is subtherapeutic.  I will start heparin and also amiodarone in an attempt to keep him in rhythm.  Check repeat ECHO.    Darden PalmerW. Spencer Waneta Fitting, Jr.  MD [facility]  01/10/2014, 9:28 AM

## 2014-01-11 DIAGNOSIS — E43 Unspecified severe protein-calorie malnutrition: Secondary | ICD-10-CM | POA: Diagnosis present

## 2014-01-11 LAB — CBC
HEMATOCRIT: 36.9 % — AB (ref 39.0–52.0)
HEMOGLOBIN: 12.5 g/dL — AB (ref 13.0–17.0)
MCH: 30.8 pg (ref 26.0–34.0)
MCHC: 33.9 g/dL (ref 30.0–36.0)
MCV: 90.9 fL (ref 78.0–100.0)
Platelets: 156 10*3/uL (ref 150–400)
RBC: 4.06 MIL/uL — ABNORMAL LOW (ref 4.22–5.81)
RDW: 13.9 % (ref 11.5–15.5)
WBC: 6.1 10*3/uL (ref 4.0–10.5)

## 2014-01-11 LAB — HEPARIN LEVEL (UNFRACTIONATED): Heparin Unfractionated: 0.6 IU/mL (ref 0.30–0.70)

## 2014-01-11 LAB — PROTIME-INR
INR: 1.67 — ABNORMAL HIGH (ref 0.00–1.49)
Prothrombin Time: 19.2 seconds — ABNORMAL HIGH (ref 11.6–15.2)

## 2014-01-11 MED ORDER — HYDROCODONE-ACETAMINOPHEN 10-325 MG PO TABS
1.0000 | ORAL_TABLET | Freq: Four times a day (QID) | ORAL | Status: DC | PRN
  Administered 2014-01-11 (×2): 1 via ORAL
  Filled 2014-01-11 (×2): qty 1

## 2014-01-11 MED ORDER — WARFARIN SODIUM 5 MG PO TABS
5.0000 mg | ORAL_TABLET | Freq: Once | ORAL | Status: AC
  Administered 2014-01-11: 5 mg via ORAL
  Filled 2014-01-11: qty 1

## 2014-01-11 NOTE — Progress Notes (Signed)
Subjective:  He is back in sinus today and feels a lot better.  Not SOB.  No chest pain.  Objective:  Vital Signs in the last 24 hours: BP 138/55  Pulse 70  Temp(Src) 98 F (36.7 C) (Oral)  Resp 20  Ht 5\' 7"  (1.702 m)  Wt 51.03 kg (112 lb 8 oz)  BMI 17.62 kg/m2  SpO2 98%  Physical Exam: Pleasant white male in no acute distress Lungs:  Reduced breath sounds Cardiac:  irregular rhythm, normal S1 and S2, no S3, 1/6 systolic murmur Abdomen:  Soft, nontender, no masses Extremities:  No edema present  Intake/Output from previous day: 01/19 0701 - 01/20 0700 In: 480 [P.O.:480] Out: 425 [Urine:425]  Weight Filed Weights   01/09/14 0459 01/10/14 0430 01/11/14 0641  Weight: 53.207 kg (117 lb 4.8 oz) 51.7 kg (113 lb 15.7 oz) 51.03 kg (112 lb 8 oz)    Lab Results: Basic Metabolic Panel:  Recent Labs  16/09/9600/17/15 2234 01/09/14 0651  NA 135* 136*  K 4.0 3.6*  CL 96 95*  CO2  --  25  GLUCOSE 97 162*  BUN 26* 22  CREATININE 1.40* 1.18   CBC:  Recent Labs  01/09/14 0651 01/11/14 0410  WBC 9.5 6.1  HGB 13.0 12.5*  HCT 38.9* 36.9*  MCV 91.5 90.9  PLT 151 156   Cardiac Enzymes: Troponin (Point of Care Test)  Recent Labs  01/08/14 2231  TROPIPOC 0.02   . Lab Results  Component Value Date   INR 1.67* 01/11/2014   INR 1.13 01/10/2014   INR 1.23 01/09/2014    Telemetry: Now in NSR  Assessment/Plan:  1. Paroxysmal atrial fibrillation recurrent 2. Long-term anticoagulation with warfarin - subtherapeutic but improving 3. Dyspnea due to underlying COPD 4. Malnutrition and weight loss noted.  Recommendations:  INR is coming up.  Continue heparin overnight and if INR up in am d/c home. Continue amiodarone.     Darden PalmerW. Spencer Tilley, Jr.  MD [facility] Cardiology  01/11/2014, 8:32 AM

## 2014-01-11 NOTE — Progress Notes (Signed)
ANTICOAGULATION CONSULT NOTE   Pharmacy Consult for Heparin / Coumadin Indication: atrial fibrillation  Allergies  Allergen Reactions  . Atorvastatin Other (See Comments)    REACTION: severe muscle aches  . Ezetimibe-Simvastatin Other (See Comments)    REACTION: severe muscle aches  . Fenofibrate     REACTION: "ran me up a wall"  . Penicillins Other (See Comments)    blisters    Labs:  Recent Labs  01/08/14 2234 01/09/14 0651 01/10/14 0355 01/10/14 1830 01/11/14 0410  HGB 15.0 13.0  --   --  12.5*  HCT 44.0 38.9*  --   --  36.9*  PLT  --  151  --   --  156  LABPROT  --  15.2 14.3  --  19.2*  INR  --  1.23 1.13  --  1.67*  HEPARINUNFRC  --   --   --  0.25* 0.60  CREATININE 1.40* 1.18  --   --   --     Estimated Creatinine Clearance: 34.2 ml/min (by C-G formula based on Cr of 1.18).   Assessment: 80yo male presents to ED w/ sudden onset of heart palpitation and SOB, found to be in Afib w/ RVR to 150s, has chronic Afib on Coumadin, to continue Coumadin during admission  Heparin level therapeutic INR trending up = 1.67 today, started amiodarone this admission CBC table  Goal of Therapy:  INR 2-3 Heparin level = 0.3 to 0.7   Plan:  1) Continue heparin at 900 units / hr 2) Coumadin 5 mg po x 1 dose today 3) Follow up AM labs  Thank you. Okey RegalLisa Bettymae Yott, PharmD [phone]   01/11/2014 9:12 AM

## 2014-01-12 LAB — CBC
HEMATOCRIT: 36.5 % — AB (ref 39.0–52.0)
HEMOGLOBIN: 12.2 g/dL — AB (ref 13.0–17.0)
MCH: 30.6 pg (ref 26.0–34.0)
MCHC: 33.4 g/dL (ref 30.0–36.0)
MCV: 91.5 fL (ref 78.0–100.0)
Platelets: 151 10*3/uL (ref 150–400)
RBC: 3.99 MIL/uL — ABNORMAL LOW (ref 4.22–5.81)
RDW: 13.9 % (ref 11.5–15.5)
WBC: 5.6 10*3/uL (ref 4.0–10.5)

## 2014-01-12 LAB — PROTIME-INR
INR: 2.38 — AB (ref 0.00–1.49)
Prothrombin Time: 25.2 seconds — ABNORMAL HIGH (ref 11.6–15.2)

## 2014-01-12 LAB — HEPARIN LEVEL (UNFRACTIONATED): HEPARIN UNFRACTIONATED: 0.95 [IU]/mL — AB (ref 0.30–0.70)

## 2014-01-12 MED ORDER — AMIODARONE HCL 200 MG PO TABS: 200.0000 mg | ORAL_TABLET | Freq: Two times a day (BID) | ORAL | Status: DC

## 2014-01-12 NOTE — Progress Notes (Signed)
ANTICOAGULATION CONSULT NOTE - Follow Up Consult  Pharmacy Consult for Heparin (also on warfarin) Indication: atrial fibrillation  Allergies  Allergen Reactions  . Atorvastatin Other (See Comments)    REACTION: severe muscle aches  . Ezetimibe-Simvastatin Other (See Comments)    REACTION: severe muscle aches  . Fenofibrate     REACTION: "ran me up a wall"  . Penicillins Other (See Comments)    blisters   Patient Measurements: Height: 5\' 7"  (170.2 cm) Weight: 112 lb 8 oz (51.03 kg) IBW/kg (Calculated) : 66.1  Vital Signs: Temp: 97.3 F (36.3 C) (01/20 2126) Temp src: Oral (01/20 2126) BP: 104/44 mmHg (01/20 2126) Pulse Rate: 60 (01/20 2126)  Labs:  Recent Labs  01/09/14 0651 01/10/14 0355 01/10/14 1830 01/11/14 0410 01/12/14 0516  HGB 13.0  --   --  12.5* 12.2*  HCT 38.9*  --   --  36.9* 36.5*  PLT 151  --   --  156 151  LABPROT 15.2 14.3  --  19.2* 25.2*  INR 1.23 1.13  --  1.67* 2.38*  HEPARINUNFRC  --   --  0.25* 0.60 0.95*  CREATININE 1.18  --   --   --   --     Estimated Creatinine Clearance: 34.2 ml/min (by C-G formula based on Cr of 1.18).  Assessment: 80 y/o M who has been on heparin bridge awaiting INR >2. INR is 2.38 this AM.   Goal of Therapy:  Heparin level 0.3-0.7 units/ml Monitor platelets by anticoagulation protocol: Yes   Plan:  -DC heparin given INR >2 -Warfarin per previous note  Abran DukeLedford, Neomi Laidler 01/12/2014,6:16 AM

## 2014-01-12 NOTE — Discharge Summary (Addendum)
Physician Discharge Summary  Patient ID: [name] MRN: [id number] DOB/AGE: 1928-04-25 80 y.o.  Admit date: 01/08/2014 Discharge date: 01/12/2014  Primary Physician:  Dr. Tillman Abideichard Letvak  Primary Discharge Diagnosis:  1. Atrial fibrillation with rapid ventricular response  Secondary Discharge Diagnosis: 2. Suboptimal warfarin anticoagulation 3. COPD 4. Acute diastolic heart failure due to atrial fibrillation 5. Coronary artery disease with previous stents 6. Hypothyroidism 7. BPH 8. Protein calorie malnutrition with recent weight loss  Procedures:  Echocardiogram  Hospital Course: This 80 year old male has a history of coronary artery disease with previous PCI of the circumflex, COPD and intermittent atrial fibrillation. He had not been feeling well for a day or 2 prior to admission presented to emergency room with palpitations and shortness of breath. He was found in atrial fibrillation with rapid ventricular response was brought in for treatment.  He was initially treated with intravenous diltiazem and converted to sinus rhythm the next day. He went back into atrial fibrillation and was placed on intravenous heparin because his warfarin was suboptimal. His warfarin dose was increased. Because of recurrent paroxysmal atrial fibrillation despite diltiazem and beta blocker, started amiodarone at 200 mg twice daily. He converted back to sinus rhythm and was feeling much better the next day. His warfarin dose was increased and his INR was 2.38 the day of discharge. Heparin was discontinued and sent him home feeling much better. He had a mild elevation of BNP that resolved.  In addition an echocardiogram showed normal left ventricular function. Left atrial size was normal. He was felt to have had a significant weight loss and this will need to be investigated as an outpatient if his oral intake does not improve.  Discharge Exam: Blood pressure 102/49, pulse 68, temperature 98.2 F  (36.8 C), temperature source Oral, resp. rate 18, height 5\' 7"  (1.702 m), weight 51.3 kg (113 lb 1.5 oz), SpO2 93.00%.   Lungs clear, no S3, normal sinus rhythm on exam  Labs: CBC:   Lab Results  Component Value Date   WBC 5.6 01/12/2014   HGB 12.2* 01/12/2014   HCT 36.5* 01/12/2014   MCV 91.5 01/12/2014   PLT 151 01/12/2014    CMP:  Recent Labs Lab 01/09/14 0651  NA 136*  K 3.6*  CL 95*  CO2 25  BUN 22  CREATININE 1.18  CALCIUM 9.0  GLUCOSE 162*   Lipid Panel     Component Value Date/Time   CHOL 185 01/09/2014 0651   TRIG 110 01/09/2014 0651   HDL 46 01/09/2014 0651   CHOLHDL 4.0 01/09/2014 0651   VLDL 22 01/09/2014 0651   LDLCALC 117* 01/09/2014 0651   BNP (last 3 results)  Recent Labs  02/22/13 1610 04/08/13 0818 01/08/14 2225  PROBNP 1108.0* 1755.0* 1259.0*   Protime: Lab Results  Component Value Date   INR 2.38* 01/12/2014   INR 1.67* 01/11/2014   INR 1.13 01/10/2014     Thyroid: Lab Results  Component Value Date   TSH 8.916* 01/09/2014    Hemoglobin A1C: Lab Results  Component Value Date   HGBA1C 5.9 01/21/2012     Radiology: Apical pleural scarring, no evidence of pleural effusion  EKG: Atrial fibrillation with rapid ventricular response on admission  Discharge Medications:   Medication List         amiodarone 200 MG tablet  Commonly known as:  PACERONE  Take 1 tablet (200 mg total) by mouth 2 (two) times daily.     amitriptyline 25 MG tablet  Commonly  known as:  ELAVIL  Take 25 mg by mouth at bedtime.     aspirin 81 MG tablet  Take 81 mg by mouth every morning.     carvedilol 3.125 MG tablet  Commonly known as:  COREG  TAKE 1 TABLET BY MOUTH TWICE A DAY WITH A MEAL     diltiazem 300 MG 24 hr capsule  Commonly known as:  CARDIZEM CD  Take 1 capsule (300 mg total) by mouth daily.     fluticasone 50 MCG/ACT nasal spray  Commonly known as:  FLONASE  Place 2 sprays into the nose daily. 2 spray, Each Nare, Daily     furosemide  40 MG tablet  Commonly known as:  LASIX  Take 1 tablet (40 mg total) by mouth 2 (two) times daily.     HYDROcodone-acetaminophen 10-325 MG per tablet  Commonly known as:  NORCO  TAKE 1 TABLET BY MOUTH 3 TIMES A DAY AS NEEDED FOR PAIN     ipratropium 17 MCG/ACT inhaler  Commonly known as:  ATROVENT HFA  Inhale 2 puffs into the lungs 4 (four) times daily.     levothyroxine 25 MCG tablet  Commonly known as:  SYNTHROID, LEVOTHROID  TAKE 1 TABLET EVERY DAY     nitroGLYCERIN 0.4 MG SL tablet  Commonly known as:  NITROSTAT  Place 0.4 mg under the tongue every 5 (five) minutes as needed for chest pain. x3 doses for for chest pain     polyethylene glycol packet  Commonly known as:  MIRALAX / GLYCOLAX  Take 17 g by mouth daily as needed for mild constipation.     potassium chloride SA 20 MEQ tablet  Commonly known as:  K-DUR,KLOR-CON  Take 1 tablet (20 mEq total) by mouth daily.     PROVENTIL HFA 108 (90 BASE) MCG/ACT inhaler  Generic drug:  albuterol  INHALE 2 PUFFS INTO THE LUNGS EVERY 4 HOURS AS NEEDED FOR WHEEZING.     RAPAFLO 8 MG Caps capsule  Generic drug:  silodosin  Take 8 mg by mouth daily.     warfarin 5 MG tablet  Commonly known as:  COUMADIN  Take 2.5-5 mg by mouth daily. Take 2.5 mg on Tuesday, Thursday, Saturday. Take 5 mg on all other days.       Followup plans and appointments: He is to have a followup Coumadin clinic appointment on Monday. He has been started on amiodarone that may alter his Coumadin metabolism. He is to followup with Dr. Donnie Aho in one week.  Time spent with patient to include physician time:  30 minutes   Signed: W. Ashley Royalty. MD [facility] 01/12/2014, 8:40 AM

## 2014-01-12 NOTE — Progress Notes (Signed)
ANTICOAGULATION CONSULT NOTE   Pharmacy Consult for Heparin / Coumadin Indication: atrial fibrillation  Allergies  Allergen Reactions  . Atorvastatin Other (See Comments)    REACTION: severe muscle aches  . Ezetimibe-Simvastatin Other (See Comments)    REACTION: severe muscle aches  . Fenofibrate     REACTION: "ran me up a wall"  . Penicillins Other (See Comments)    blisters    Labs:  Recent Labs  01/10/14 0355 01/10/14 1830 01/11/14 0410 01/12/14 0516  HGB  --   --  12.5* 12.2*  HCT  --   --  36.9* 36.5*  PLT  --   --  156 151  LABPROT 14.3  --  19.2* 25.2*  INR 1.13  --  1.67* 2.38*  HEPARINUNFRC  --  0.25* 0.60 0.95*    Estimated Creatinine Clearance: 34.4 ml/min (by C-G formula based on Cr of 1.18).   Assessment: 80yo male presents to ED w/ sudden onset of heart palpitation and SOB, found to be in Afib w/ RVR to 150s, has chronic Afib on Coumadin, to continue Coumadin during admission  INR trending up = 2.38 today, started amiodarone this admission CBC table  Goal of Therapy:  INR 2-3    Plan:  1) Home today on home dose - 2.5 mg TTSat, 5 mg other days (starting 1/22) 2) Follow up thyroid studies as an outpatient?   Thank you. [name], PharmD [phone]   01/12/2014 9:21 AM

## 2014-01-12 NOTE — Progress Notes (Signed)
Dc instructions given to pt at this time re: f/u appts, diet, activity, coumadin and other medications, s/s of problems to report to md.  No s/s of any acute distress at dc.  IV dc'd  Tele dc'd.  Central monitoring notified of pt's dc.

## 2014-01-13 ENCOUNTER — Ambulatory Visit: Payer: Medicare Other

## 2014-01-19 ENCOUNTER — Telehealth: Payer: Self-pay | Admitting: *Deleted

## 2014-01-19 ENCOUNTER — Ambulatory Visit (INDEPENDENT_AMBULATORY_CARE_PROVIDER_SITE_OTHER): Payer: Medicare Other | Admitting: Internal Medicine

## 2014-01-19 ENCOUNTER — Encounter: Payer: Self-pay | Admitting: Internal Medicine

## 2014-01-19 VITALS — BP 134/60 | HR 98 | Temp 99.1°F | Wt 112.0 lb

## 2014-01-19 DIAGNOSIS — I5032 Chronic diastolic (congestive) heart failure: Secondary | ICD-10-CM

## 2014-01-19 DIAGNOSIS — J449 Chronic obstructive pulmonary disease, unspecified: Secondary | ICD-10-CM

## 2014-01-19 DIAGNOSIS — I4891 Unspecified atrial fibrillation: Secondary | ICD-10-CM

## 2014-01-19 DIAGNOSIS — E43 Unspecified severe protein-calorie malnutrition: Secondary | ICD-10-CM

## 2014-01-19 NOTE — Patient Instructions (Signed)
Please weigh daily---don't take the furosemide until your weight is up 5# from where it is today. Start ensure or boost mixed with ice cream--- hopefully 2 cans per day

## 2014-01-19 NOTE — Assessment & Plan Note (Signed)
Hard to tell whether his dyspnea is from this or COPD (or combo) Is dry now---will have him hold the furosemide till his weight is coming up

## 2014-01-19 NOTE — Progress Notes (Signed)
Subjective:    Patient ID: [name], male    DOB: December 02, 1928, 80 y.o.   MRN: [id number]  HPI Here with wife and daughter  Hospitalized last week---rapid a fib CHF exacerbation due to chronotropic diastolic dysfunction  Not doing well Appetite is poor--- since hospital Has lost 10# Not on any supplements  Breathing is still bad DOE with barely walking Cannot do personal care---like showering or using toilet--without sig dyspnea and recovery time  No palpitations but heart doesn't feel right Some upper chest pain ---in the middle (fairly constant)  Still with back pain Analgesics not helping as much  Current Outpatient Prescriptions on File Prior to Visit  Medication Sig Dispense Refill  . amiodarone (PACERONE) 200 MG tablet Take 1 tablet (200 mg total) by mouth 2 (two) times daily.  30 tablet  12  . amitriptyline (ELAVIL) 25 MG tablet Take 25 mg by mouth at bedtime.      Marland Kitchen aspirin 81 MG tablet Take 81 mg by mouth every morning.       . carvedilol (COREG) 3.125 MG tablet TAKE 1 TABLET BY MOUTH TWICE A DAY WITH A MEAL  60 tablet  5  . diltiazem (CARDIZEM CD) 300 MG 24 hr capsule Take 1 capsule (300 mg total) by mouth daily.  30 capsule  0  . fluticasone (FLONASE) 50 MCG/ACT nasal spray Place 2 sprays into the nose daily. 2 spray, Each Nare, Daily  16 g  0  . furosemide (LASIX) 40 MG tablet Take 1 tablet (40 mg total) by mouth 2 (two) times daily.  60 tablet  11  . HYDROcodone-acetaminophen (NORCO) 10-325 MG per tablet TAKE 1 TABLET BY MOUTH 3 TIMES A DAY AS NEEDED FOR PAIN  90 tablet  0  . ipratropium (ATROVENT HFA) 17 MCG/ACT inhaler Inhale 2 puffs into the lungs 4 (four) times daily.  3 Inhaler  3  . levothyroxine (SYNTHROID, LEVOTHROID) 25 MCG tablet TAKE 1 TABLET EVERY DAY  90 tablet  3  . nitroGLYCERIN (NITROSTAT) 0.4 MG SL tablet Place 0.4 mg under the tongue every 5 (five) minutes as needed for chest pain. x3 doses for for chest pain      . polyethylene glycol  (MIRALAX / GLYCOLAX) packet Take 17 g by mouth daily as needed for mild constipation.       . potassium chloride SA (K-DUR,KLOR-CON) 20 MEQ tablet Take 1 tablet (20 mEq total) by mouth daily.  30 tablet  11  . PROVENTIL HFA 108 (90 BASE) MCG/ACT inhaler INHALE 2 PUFFS INTO THE LUNGS EVERY 4 HOURS AS NEEDED FOR WHEEZING.  6.7 each  2  . RAPAFLO 8 MG CAPS Take 8 mg by mouth daily.       Marland Kitchen warfarin (COUMADIN) 5 MG tablet Take 2.5-5 mg by mouth daily. Take 2.5 mg on Tuesday, Thursday, Saturday. Take 5 mg on all other days.       No current facility-administered medications on file prior to visit.    Allergies  Allergen Reactions  . Atorvastatin Other (See Comments)    REACTION: severe muscle aches  . Ezetimibe-Simvastatin Other (See Comments)    REACTION: severe muscle aches  . Fenofibrate     REACTION: "ran me up a wall"  . Penicillins Other (See Comments)    blisters    Past Medical History  Diagnosis Date  . CAD (coronary artery disease)   . Hyperlipidemia   . Kidney stones   . BPH (benign prostatic hypertrophy)   .  Osteoarthritis   . COPD (chronic obstructive pulmonary disease)   . Hypothyroidism   . Atrial fibrillation   . Impaired fasting glucose   . Myocardial infarction   . Hypertension   . Shortness of breath   . CHF (congestive heart failure)   . GERD (gastroesophageal reflux disease)     Past Surgical History  Procedure Laterality Date  . Inguinal hernia repair  09/2004    left  . Back surgery  1990's    x3  . Transurethral resection of prostate  2002  . Pleural scarification  1980    right  . Esophagogastroduodenoscopy  04/2002    negative  . Bladder stone removal  06/2006  . Colonoscopy Left 04/13/2013    Procedure: COLONOSCOPY;  Surgeon: Willis ModenaWilliam Outlaw, MD;  [facility];  Service: Endoscopy;  Laterality: Left;    Family History  Problem Relation Age of Onset  . Heart disease Mother   . Heart disease Brother   . Stroke Brother   . Cancer  Neg Hx   . Diabetes Neg Hx   . Hypertension Neg Hx   . Heart disease Brother   . Stroke Brother   . Heart disease Brother   . Stroke Brother     History   Social History  . Marital Status: Married    Spouse Name: N/A    Number of Children: 1  . Years of Education: N/A   Occupational History  . retired Therapist, [employer]    Social History Main Topics  . Smoking status: Former Smoker -- 1.00 packs/day for 60 years    Types: Cigarettes    Quit date: 12/24/2011  . Smokeless tobacco: Never Used  . Alcohol Use: No  . Drug Use: No  . Sexual Activity: Not on file   Other Topics Concern  . Not on file   Social History Narrative   Does lots of yard work and a big garden   Has living will   No [place] health care POA. Asks for daughter Ardeen FillersLisa Bray to make decisions if needed.   Discussed DNR--- he doesn't want artificial ventilation but isn't clear about this.   Not sure about tube feeds   Review of Systems Not sleeping well. In chair. Has PND also Not voiding as well     Objective:   Physical Exam  Constitutional: No distress.  Neck: Normal range of motion. Neck supple.  Cardiovascular: Normal rate.  Exam reveals no gallop.   No murmur heard. Sounds regular but occasional skips  Pulmonary/Chest: He has no rales.  Mild tachypnea Increased work of breathing Decreased breath sounds but not tight  Musculoskeletal: He exhibits no edema.  Lymphadenopathy:    He has no cervical adenopathy.  Psychiatric: He has a normal mood and affect. His behavior is normal.          Assessment & Plan:

## 2014-01-19 NOTE — Assessment & Plan Note (Signed)
Discussed adding ensure or boost mixed with ice cream (for flavor)

## 2014-01-19 NOTE — Assessment & Plan Note (Addendum)
Not tight but severe dyspnea with any activity This is limiting him even for ADLs like using the toilet O2 sat is only 88% here on room air and at rest---will set up oxygen at home Will refer for home health [facility] and [facility]

## 2014-01-19 NOTE — Telephone Encounter (Signed)
Called daughter to let her know that ( per fax I received) [facility] should be in contact with them within the next few days. Daughter stated they have the oxygen now and the pt is talking a lot more out of his mind, she also states he won't keep the oxygen on and he's real aggressive, she leaves at night and she doesn't think her mother will be able to handle him and would like any suggestions.

## 2014-01-19 NOTE — Assessment & Plan Note (Signed)
Probably still sinus (only occ skips) Amiodarone likely affecting his appetite He sees Dr Donnie Ahoilley tomorrow---may be able to cut this dose down

## 2014-01-19 NOTE — Progress Notes (Signed)
Pre-visit discussion using our clinic review tool. No additional management support is needed unless otherwise documented below in the visit note.  

## 2014-01-19 NOTE — Telephone Encounter (Signed)
Spoke with daughter again and she wanted to know when homehealth would be out and I confirmed with marion she said at least within 48 hours. While I was talking with daughter the pt was in the bathroom trying to use the bathroom in the sink, he still won't wear the oxygen and she's going to take him back to the ED because he's so aggressive and his mental status. I asked her to call to let us know what happens and I would page Dr. Alphonsus SiasLetvak.  While I was typing the message they called back and the pt and wife refused the ED.

## 2014-01-20 ENCOUNTER — Inpatient Hospital Stay (HOSPITAL_COMMUNITY)
Admission: EM | Admit: 2014-01-20 | Discharge: 2014-01-25 | DRG: 193 | Disposition: A | Discharge status: Skilled Nursing Facility | Payer: Medicare Other | Attending: Internal Medicine | Admitting: Internal Medicine

## 2014-01-20 ENCOUNTER — Ambulatory Visit: Payer: Medicare Other

## 2014-01-20 ENCOUNTER — Emergency Department (HOSPITAL_COMMUNITY): Payer: Medicare Other

## 2014-01-20 ENCOUNTER — Telehealth: Payer: Self-pay | Admitting: Internal Medicine

## 2014-01-20 ENCOUNTER — Encounter (HOSPITAL_COMMUNITY): Payer: Self-pay | Admitting: Emergency Medicine

## 2014-01-20 DIAGNOSIS — N400 Enlarged prostate without lower urinary tract symptoms: Secondary | ICD-10-CM | POA: Diagnosis present

## 2014-01-20 DIAGNOSIS — I10 Essential (primary) hypertension: Secondary | ICD-10-CM

## 2014-01-20 DIAGNOSIS — I129 Hypertensive chronic kidney disease with stage 1 through stage 4 chronic kidney disease, or unspecified chronic kidney disease: Secondary | ICD-10-CM | POA: Diagnosis present

## 2014-01-20 DIAGNOSIS — K219 Gastro-esophageal reflux disease without esophagitis: Secondary | ICD-10-CM | POA: Diagnosis present

## 2014-01-20 DIAGNOSIS — J189 Pneumonia, unspecified organism: Secondary | ICD-10-CM

## 2014-01-20 DIAGNOSIS — E785 Hyperlipidemia, unspecified: Secondary | ICD-10-CM | POA: Diagnosis present

## 2014-01-20 DIAGNOSIS — Z8249 Family history of ischemic heart disease and other diseases of the circulatory system: Secondary | ICD-10-CM

## 2014-01-20 DIAGNOSIS — Z87891 Personal history of nicotine dependence: Secondary | ICD-10-CM

## 2014-01-20 DIAGNOSIS — I4891 Unspecified atrial fibrillation: Secondary | ICD-10-CM

## 2014-01-20 DIAGNOSIS — R5381 Other malaise: Secondary | ICD-10-CM | POA: Diagnosis present

## 2014-01-20 DIAGNOSIS — Z9981 Dependence on supplemental oxygen: Secondary | ICD-10-CM

## 2014-01-20 DIAGNOSIS — E43 Unspecified severe protein-calorie malnutrition: Secondary | ICD-10-CM | POA: Diagnosis present

## 2014-01-20 DIAGNOSIS — I2510 Atherosclerotic heart disease of native coronary artery without angina pectoris: Secondary | ICD-10-CM

## 2014-01-20 DIAGNOSIS — G8929 Other chronic pain: Secondary | ICD-10-CM | POA: Diagnosis present

## 2014-01-20 DIAGNOSIS — N183 Chronic kidney disease, stage 3 unspecified: Secondary | ICD-10-CM | POA: Diagnosis present

## 2014-01-20 DIAGNOSIS — E039 Hypothyroidism, unspecified: Secondary | ICD-10-CM | POA: Diagnosis present

## 2014-01-20 DIAGNOSIS — R404 Transient alteration of awareness: Secondary | ICD-10-CM | POA: Diagnosis present

## 2014-01-20 DIAGNOSIS — Z7982 Long term (current) use of aspirin: Secondary | ICD-10-CM

## 2014-01-20 DIAGNOSIS — R791 Abnormal coagulation profile: Secondary | ICD-10-CM | POA: Diagnosis present

## 2014-01-20 DIAGNOSIS — R1310 Dysphagia, unspecified: Secondary | ICD-10-CM | POA: Diagnosis present

## 2014-01-20 DIAGNOSIS — J4489 Other specified chronic obstructive pulmonary disease: Secondary | ICD-10-CM | POA: Diagnosis present

## 2014-01-20 DIAGNOSIS — G929 Unspecified toxic encephalopathy: Secondary | ICD-10-CM | POA: Diagnosis present

## 2014-01-20 DIAGNOSIS — J449 Chronic obstructive pulmonary disease, unspecified: Secondary | ICD-10-CM | POA: Diagnosis present

## 2014-01-20 DIAGNOSIS — R410 Disorientation, unspecified: Secondary | ICD-10-CM | POA: Diagnosis present

## 2014-01-20 DIAGNOSIS — Z7901 Long term (current) use of anticoagulants: Secondary | ICD-10-CM

## 2014-01-20 DIAGNOSIS — M1990 Unspecified osteoarthritis, unspecified site: Secondary | ICD-10-CM | POA: Diagnosis present

## 2014-01-20 DIAGNOSIS — F0390 Unspecified dementia without behavioral disturbance: Secondary | ICD-10-CM | POA: Diagnosis present

## 2014-01-20 DIAGNOSIS — J9600 Acute respiratory failure, unspecified whether with hypoxia or hypercapnia: Secondary | ICD-10-CM | POA: Diagnosis present

## 2014-01-20 DIAGNOSIS — G92 Toxic encephalopathy: Secondary | ICD-10-CM

## 2014-01-20 DIAGNOSIS — I252 Old myocardial infarction: Secondary | ICD-10-CM

## 2014-01-20 DIAGNOSIS — I5032 Chronic diastolic (congestive) heart failure: Secondary | ICD-10-CM | POA: Diagnosis present

## 2014-01-20 DIAGNOSIS — I509 Heart failure, unspecified: Secondary | ICD-10-CM | POA: Diagnosis present

## 2014-01-20 LAB — CBC WITH DIFFERENTIAL/PLATELET
BASOS PCT: 0 % (ref 0–1)
Basophils Absolute: 0 10*3/uL (ref 0.0–0.1)
EOS PCT: 0 % (ref 0–5)
Eosinophils Absolute: 0 10*3/uL (ref 0.0–0.7)
HEMATOCRIT: 33.3 % — AB (ref 39.0–52.0)
Hemoglobin: 11.6 g/dL — ABNORMAL LOW (ref 13.0–17.0)
LYMPHS ABS: 1 10*3/uL (ref 0.7–4.0)
Lymphocytes Relative: 6 % — ABNORMAL LOW (ref 12–46)
MCH: 30.4 pg (ref 26.0–34.0)
MCHC: 34.8 g/dL (ref 30.0–36.0)
MCV: 87.4 fL (ref 78.0–100.0)
MONO ABS: 0.9 10*3/uL (ref 0.1–1.0)
MONOS PCT: 5 % (ref 3–12)
NEUTROS ABS: 15.2 10*3/uL — AB (ref 1.7–7.7)
Neutrophils Relative %: 89 % — ABNORMAL HIGH (ref 43–77)
Platelets: 362 10*3/uL (ref 150–400)
RBC: 3.81 MIL/uL — AB (ref 4.22–5.81)
RDW: 14 % (ref 11.5–15.5)
WBC: 17.1 10*3/uL — AB (ref 4.0–10.5)

## 2014-01-20 LAB — POCT I-STAT, CHEM 8
BUN: 31 mg/dL — ABNORMAL HIGH (ref 6–23)
Calcium, Ion: 1.26 mmol/L (ref 1.13–1.30)
Chloride: 100 mEq/L (ref 96–112)
Creatinine, Ser: 0.9 mg/dL (ref 0.50–1.35)
Glucose, Bld: 115 mg/dL — ABNORMAL HIGH (ref 70–99)
HCT: 36 % — ABNORMAL LOW (ref 39.0–52.0)
Hemoglobin: 12.2 g/dL — ABNORMAL LOW (ref 13.0–17.0)
Potassium: 4.3 mEq/L (ref 3.7–5.3)
Sodium: 136 mEq/L — ABNORMAL LOW (ref 137–147)
TCO2: 28 mmol/L (ref 0–100)

## 2014-01-20 LAB — URINE MICROSCOPIC-ADD ON

## 2014-01-20 LAB — URINALYSIS, ROUTINE W REFLEX MICROSCOPIC
Bilirubin Urine: NEGATIVE
Glucose, UA: NEGATIVE mg/dL
Hgb urine dipstick: NEGATIVE
Ketones, ur: NEGATIVE mg/dL
LEUKOCYTES UA: NEGATIVE
NITRITE: NEGATIVE
PH: 6.5 (ref 5.0–8.0)
Protein, ur: 30 mg/dL — AB
SPECIFIC GRAVITY, URINE: 1.02 (ref 1.005–1.030)
UROBILINOGEN UA: 1 mg/dL (ref 0.0–1.0)

## 2014-01-20 LAB — PRO B NATRIURETIC PEPTIDE: Pro B Natriuretic peptide (BNP): 1528 pg/mL — ABNORMAL HIGH (ref 0–450)

## 2014-01-20 LAB — BASIC METABOLIC PANEL
BUN: 29 mg/dL — ABNORMAL HIGH (ref 6–23)
CO2: 24 mEq/L (ref 19–32)
Calcium: 9.7 mg/dL (ref 8.4–10.5)
Chloride: 98 mEq/L (ref 96–112)
Creatinine, Ser: 0.88 mg/dL (ref 0.50–1.35)
GFR calc Af Amer: 90 mL/min — ABNORMAL LOW (ref >90)
GFR calc non Af Amer: 77 mL/min — ABNORMAL LOW (ref >90)
Glucose, Bld: 110 mg/dL — ABNORMAL HIGH (ref 70–99)
Potassium: 4.6 mEq/L (ref 3.7–5.3)
Sodium: 138 mEq/L (ref 137–147)

## 2014-01-20 LAB — POCT I-STAT TROPONIN I: Troponin i, poc: 0 ng/mL (ref 0.00–0.08)

## 2014-01-20 MED ORDER — VANCOMYCIN HCL IN DEXTROSE 1-5 GM/200ML-% IV SOLN
1000.0000 mg | INTRAVENOUS | Status: DC
  Administered 2014-01-21 – 2014-01-24 (×4): 1000 mg via INTRAVENOUS
  Filled 2014-01-20 (×6): qty 200

## 2014-01-20 MED ORDER — BENZONATATE 100 MG PO CAPS
100.0000 mg | ORAL_CAPSULE | Freq: Once | ORAL | Status: AC
  Administered 2014-01-20: 100 mg via ORAL
  Filled 2014-01-20: qty 1

## 2014-01-20 MED ORDER — VANCOMYCIN HCL IN DEXTROSE 1-5 GM/200ML-% IV SOLN
1000.0000 mg | Freq: Once | INTRAVENOUS | Status: AC
  Administered 2014-01-20: 1000 mg via INTRAVENOUS
  Filled 2014-01-20: qty 200

## 2014-01-20 MED ORDER — DEXTROSE 5 % IV SOLN
1.0000 g | Freq: Three times a day (TID) | INTRAVENOUS | Status: DC
  Administered 2014-01-21 (×2): 1 g via INTRAVENOUS
  Filled 2014-01-20 (×5): qty 1

## 2014-01-20 MED ORDER — DEXTROSE 5 % IV SOLN
1.0000 g | Freq: Once | INTRAVENOUS | Status: AC
  Administered 2014-01-20: 1 g via INTRAVENOUS
  Filled 2014-01-20: qty 1

## 2014-01-20 MED ORDER — ALBUTEROL SULFATE (2.5 MG/3ML) 0.083% IN NEBU
2.5000 mg | INHALATION_SOLUTION | RESPIRATORY_TRACT | Status: AC | PRN
  Administered 2014-01-21: 2.5 mg via RESPIRATORY_TRACT
  Filled 2014-01-20: qty 3

## 2014-01-20 NOTE — Telephone Encounter (Signed)
Spoke with daughter again and pt didn't have a good night, he won't eat, won't take medication and won't keep the oxygen on. She needs home health today, she thinks he needs 24hours care. Pt has a cardiology appt tomorrow and doesn't think he will be able to make it. She states the cough is really bad and needs something done today.

## 2014-01-20 NOTE — Progress Notes (Addendum)
ANTIBIOTIC CONSULT NOTE - INITIAL  Pharmacy Consult for Vancomycin/Cefepime Indication: pneumonia  Allergies  Allergen Reactions  . Fenofibrate Other (See Comments)    REACTION: "ran me up a wall"  . Penicillins Other (See Comments)    blisters  . Atorvastatin Other (See Comments)    REACTION: severe muscle aches  . Ezetimibe-Simvastatin Other (See Comments)    REACTION: severe muscle aches   Patient Measurements: Last recorded weight = 50.8kg 01/19/2014 Last recorded height = 67 inches  Vital Signs: Temp: 98.2 F (36.8 C) (01/29 2013) BP: 127/53 mmHg (01/29 2013) Pulse Rate: 75 (01/29 2013)  Medical History: Past Medical History  Diagnosis Date  . CAD (coronary artery disease)   . Hyperlipidemia   . Kidney stones   . BPH (benign prostatic hypertrophy)   . Osteoarthritis   . COPD (chronic obstructive pulmonary disease)   . Hypothyroidism   . Atrial fibrillation   . Impaired fasting glucose   . Myocardial infarction   . Hypertension   . Shortness of breath   . CHF (congestive heart failure)   . GERD (gastroesophageal reflux disease)    Medications:  Anti-infectives   None     Assessment: This is an 80 yo male with multiple medical problems who comes to the ED with c/o worsening respiratory status.  Family members also note a bit more confusion, not eating as he normally does and that his urine is concentrated.  He had a normal creatinine on 01/09/2014 of 1.1.  He is < IBW however, and likely has some degree of malnutrition and thus his creatinine may not truly be reflective of his clearance.  His chest x-ray reveals likely pneumonia and we have been asked to initiate broad spectrum IV antibiotics with Vancomycin and Cefepime.  Goal of Therapy:  Vancomycin trough level 15-20 mcg/ml  Plan:   Will give Vancomycin 1 gm IV x 1  Will give Cefepime 1 gm IV x 1  Order BMET to evaluate renal function and re-dose antibiotics once results are known.  Nadara MustardNita Doreen Garretson,  PharmD., MS Clinical Pharmacist Pager:  [phone] Thank you for allowing pharmacy to be part of this patients care team. 01/20/2014,10:00 PM  Addendum: Creatinine is resulted and is wnl at 0.9.  Will place him on maintenance antibiotics with Cefepime 1 gm every 8 hours and Vancomycin 1 gm every 24 hours.  Nadara MustardNita Birdell Frasier, PharmD., MS Clinical Pharmacist Pager:  [phone]

## 2014-01-20 NOTE — ED Provider Notes (Signed)
CSN: [id number]     Arrival date & time 01/20/14  2006 History   First MD Initiated Contact with Patient 01/20/14 2038     Chief Complaint  Patient presents with  . Shortness of Breath   (Consider location/radiation/quality/duration/timing/severity/associated sxs/prior Treatment) HPI  80 year old male with history of CHF, atrial fibrillation currently on warfarin, presents complaining of worsening shortness of breath. Patient was brought in via EMS. Patient was admitted on January 17 and was discharged 21st for management of his atrial fibrillation. Family reports since discharge patient hasn't been the same. He appears to be more confused than usual, not eating, not taking his medication as he should. He is having increased shortness of breath which he was started on home oxygen today after visiting his PCP for followup.  Family members noticed that his urine is concentrated. The concern for possible urinary tract infection.  He continues to have productive cough. He has been resting his recliner and has not been active. Patient also complaining of pain in his nose and has been mouth breathing. He also complaining of pain to his right side of her chest on occasion. His PCP felt patient would benefit from home health care, however family member worries that he may not be well cared at home, thus brought patient to the ER for further evaluation. History is limited from patient.  Past Medical History  Diagnosis Date  . CAD (coronary artery disease)   . Hyperlipidemia   . Kidney stones   . BPH (benign prostatic hypertrophy)   . Osteoarthritis   . COPD (chronic obstructive pulmonary disease)   . Hypothyroidism   . Atrial fibrillation   . Impaired fasting glucose   . Myocardial infarction   . Hypertension   . Shortness of breath   . CHF (congestive heart failure)   . GERD (gastroesophageal reflux disease)    Past Surgical History  Procedure Laterality Date  . Inguinal hernia repair  09/2004     left  . Back surgery  1990's    x3  . Transurethral resection of prostate  2002  . Pleural scarification  1980    right  . Esophagogastroduodenoscopy  04/2002    negative  . Bladder stone removal  06/2006  . Colonoscopy Left 04/13/2013    Procedure: COLONOSCOPY;  Surgeon: Willis ModenaWilliam Outlaw, MD;  [facility];  Service: Endoscopy;  Laterality: Left;   Family History  Problem Relation Age of Onset  . Heart disease Mother   . Heart disease Brother   . Stroke Brother   . Cancer Neg Hx   . Diabetes Neg Hx   . Hypertension Neg Hx   . Heart disease Brother   . Stroke Brother   . Heart disease Brother   . Stroke Brother    History  Substance Use Topics  . Smoking status: Former Smoker -- 1.00 packs/day for 60 years    Types: Cigarettes    Quit date: 12/24/2011  . Smokeless tobacco: Never Used  . Alcohol Use: No    Review of Systems  Unable to perform ROS: Age    Allergies  Atorvastatin; Ezetimibe-simvastatin; Fenofibrate; and Penicillins  Home Medications   Current Outpatient Rx  Name  Route  Sig  Dispense  Refill  . amiodarone (PACERONE) 200 MG tablet   Oral   Take 1 tablet (200 mg total) by mouth 2 (two) times daily.   30 tablet   12   . amitriptyline (ELAVIL) 25 MG tablet   Oral  Take 25 mg by mouth at bedtime.         Marland Kitchen aspirin 81 MG tablet   Oral   Take 81 mg by mouth every morning.          . carvedilol (COREG) 3.125 MG tablet      TAKE 1 TABLET BY MOUTH TWICE A DAY WITH A MEAL   60 tablet   5   . diltiazem (CARDIZEM CD) 300 MG 24 hr capsule   Oral   Take 1 capsule (300 mg total) by mouth daily.   30 capsule   0   . fluticasone (FLONASE) 50 MCG/ACT nasal spray   Nasal   Place 2 sprays into the nose daily. 2 spray, Each Nare, Daily   16 g   0   . furosemide (LASIX) 40 MG tablet   Oral   Take 1 tablet (40 mg total) by mouth 2 (two) times daily.   60 tablet   11   . HYDROcodone-acetaminophen (NORCO) 10-325 MG per tablet       TAKE 1 TABLET BY MOUTH 3 TIMES A DAY AS NEEDED FOR PAIN   90 tablet   0   . ipratropium (ATROVENT HFA) 17 MCG/ACT inhaler   Inhalation   Inhale 2 puffs into the lungs 4 (four) times daily.   3 Inhaler   3   . levothyroxine (SYNTHROID, LEVOTHROID) 25 MCG tablet      TAKE 1 TABLET EVERY DAY   90 tablet   3   . nitroGLYCERIN (NITROSTAT) 0.4 MG SL tablet   Sublingual   Place 0.4 mg under the tongue every 5 (five) minutes as needed for chest pain. x3 doses for for chest pain         . polyethylene glycol (MIRALAX / GLYCOLAX) packet   Oral   Take 17 g by mouth daily as needed for mild constipation.          . potassium chloride SA (K-DUR,KLOR-CON) 20 MEQ tablet   Oral   Take 1 tablet (20 mEq total) by mouth daily.   30 tablet   11   . PROVENTIL HFA 108 (90 BASE) MCG/ACT inhaler      INHALE 2 PUFFS INTO THE LUNGS EVERY 4 HOURS AS NEEDED FOR WHEEZING.   6.7 each   2   . RAPAFLO 8 MG CAPS   Oral   Take 8 mg by mouth daily.          Marland Kitchen warfarin (COUMADIN) 5 MG tablet   Oral   Take 2.5-5 mg by mouth daily. Take 2.5 mg on Tuesday, Thursday, Saturday. Take 5 mg on all other days.          BP 127/53  Pulse 75  Temp(Src) 98.2 F (36.8 C)  Resp 30  SpO2 95% Physical Exam  Constitutional: He appears well-developed and well-nourished. No distress.  Chronically ill-appearing male appears to be in no acute respiratory distress  HENT:  Head: Atraumatic.  Right Ear: External ear normal.  Left Ear: External ear normal.  Mouth is dry  Eyes: Conjunctivae are normal.  Neck: Normal range of motion. Neck supple. No JVD present.  Cardiovascular: Normal rate and regular rhythm.   Pulmonary/Chest: Effort normal. He has rales (Rales heard at right lower lung base). He exhibits no tenderness.  Abdominal: Soft. There is no tenderness.  Musculoskeletal: He exhibits no edema and no tenderness.  Neurological: He is alert.   Patient is alert to self, and date but unsure  of  place or situation  No gross focal neuro deficit on exam. Unable to check for gait stability.  4/5 strength to all 4 extremities.    Skin: No rash noted.  Psychiatric: He has a normal mood and affect.    ED Course  Procedures (including critical care time)   Date: 01/20/2014  Rate: 84  Rhythm: normal sinus rhythm  QRS Axis: normal  Intervals: normal  ST/T Wave abnormalities: normal  Conduction Disutrbances: none  Narrative Interpretation:   Old EKG Reviewed: No significant changes noted     Patient here with worsening shortness of breath and occasional wheezing along with productive cough. He does have rales to his right base and skin feels warm. I suspect possible pneumonia. No significant edema noted on exam and no JVD. He is currently not in any acute respiratory distress while on nasal cannula. No gross focal neuro deficit on initial exam. Workup initiated.   9:40 PM Chest x-rays show a new right upper lobe infiltrate concerning for pneumonia. With recent hospitalization patient will need to be treated for hospital-acquired pneumonia. We'll treat with cefepime and vanc.  Care discussed with Dr. Juleen China.    10:38 PM Patient was seen at his doctor's office yesterday and was found to have oxygenation of 88% on room air. No chest x-ray performed at that time. Patient was sent home with home O2.    11:04 PM I have consult with triad hospitalist Dr. Donaciano Eva who agrees to admit patient to a telemetry bed, team 10, under his care.   Labs Review Labs Reviewed  CBC WITH DIFFERENTIAL - Abnormal; Notable for the following:    WBC 17.1 (*)    RBC 3.81 (*)    Hemoglobin 11.6 (*)    HCT 33.3 (*)    Neutrophils Relative % 89 (*)    Lymphocytes Relative 6 (*)    Neutro Abs 15.2 (*)    All other components within normal limits  PRO B NATRIURETIC PEPTIDE - Abnormal; Notable for the following:    Pro B Natriuretic peptide (BNP) 1528.0 (*)    All other components within normal limits   BASIC METABOLIC PANEL - Abnormal; Notable for the following:    Glucose, Bld 110 (*)    BUN 29 (*)    GFR calc non Af Amer 77 (*)    GFR calc Af Amer 90 (*)    All other components within normal limits  POCT I-STAT, CHEM 8 - Abnormal; Notable for the following:    Sodium 136 (*)    BUN 31 (*)    Glucose, Bld 115 (*)    Hemoglobin 12.2 (*)    HCT 36.0 (*)    All other components within normal limits  PROTIME-INR  URINALYSIS, ROUTINE W REFLEX MICROSCOPIC  POCT I-STAT TROPONIN I   Imaging Review Dg Chest 2 View  01/20/2014   CLINICAL DATA:  Shortness of breath  EXAM: CHEST  2 VIEW  COMPARISON:  01/08/2014  FINDINGS: Cardiac shadow is stable. New right upper lobe infiltrate is seen when compare with the prior exam. Diffuse fibrotic changes are again identified throughout both lungs. Changes of prior granulomatous disease are noted as well.  IMPRESSION: New right upper lobe infiltrate superimposed over chronic changes bilaterally.   Electronically Signed   By: Alcide Clever M.D.   On: 01/20/2014 21:19    EKG Interpretation   None       MDM   1. HCAP (healthcare-associated pneumonia)    BP 131/56  Pulse 83  Temp(Src) 98.2 F (36.8 C)  Resp 18  SpO2 100%  I have reviewed nursing notes and vital signs. I personally reviewed the imaging tests through PACS system  I reviewed available ER/hospitalization records thought the EMR     Fayrene Helper, PA-C 01/20/14 2306  Fayrene Helper, PA-C 01/20/14 2333

## 2014-01-20 NOTE — ED Notes (Addendum)
Per EMS, pt recently here for CHF. sts since discharged hasn't gotten better. sts wheezing, SOB and  productive cough. Pt from home. sts also per family pt has been having some confusion and they are concerned that he may have a UTI. Received 5 albuterol in route.

## 2014-01-20 NOTE — ED Notes (Signed)
Pt placed on 2L [place] for SpO2 87%. Pt now at 93%

## 2014-01-20 NOTE — H&P (Signed)
[facility] History and Physical  Patient: [name]  ZOX:[id number]  DOB: 1928-07-29  DOS: the patient was seen and examined on 01/20/2014 PCP: Tillman Abide, MD  Chief Complaint: Altered mental status  HPI: Rambo Sarafian is a 80 y.o. male with Past medical history of coronary artery disease, atrial fibrillation 4 recent admissions, dyslipidemia, COPD, GERD. The patient is coming from home. The patient was brought in by her family. The history has been obtained from ED physician as well as documentation since the patient was felt to be poor historian. At the time of my evaluation the patient complained of some right-sided pain other than that he did not have any complain. The patient was recently admitted for atrial fibrillation and diastolic dysfunction he was discharged he was having significant dyspnea on exertion. The PCP visited him yesterday and recommended home oxygen. As per the documentation the patient has been having confusion and not acting himself since last few days. Patient is also not eating not taking his medication appropriately he is not keeping his oxygen on a regular basis. He also had productive cough.   Review of Systems: as mentioned in the history of present illness.  A Comprehensive review of the other systems is negative.  Past Medical History  Diagnosis Date  . CAD (coronary artery disease)   . Hyperlipidemia   . Kidney stones   . BPH (benign prostatic hypertrophy)   . Osteoarthritis   . COPD (chronic obstructive pulmonary disease)   . Hypothyroidism   . Atrial fibrillation   . Impaired fasting glucose   . Myocardial infarction   . Hypertension   . Shortness of breath   . CHF (congestive heart failure)   . GERD (gastroesophageal reflux disease)    Past Surgical History  Procedure Laterality Date  . Inguinal hernia repair  09/2004    left  . Back surgery  1990's    x3  . Transurethral resection of prostate  2002  . Pleural scarification   1980    right  . Esophagogastroduodenoscopy  04/2002    negative  . Bladder stone removal  06/2006  . Colonoscopy Left 04/13/2013    Procedure: COLONOSCOPY;  Surgeon: Willis Modena, MD;  Location: [facility] ENDOSCOPY;  Service: Endoscopy;  Laterality: Left;   Social History:  reports that he quit smoking about 2 years ago. His smoking use included Cigarettes. He has a 60 pack-year smoking history. He has never used smokeless tobacco. He reports that he does not drink alcohol or use illicit drugs. Partially Independent for most of his  ADL.  Allergies  Allergen Reactions  . Fenofibrate Other (See Comments)    REACTION: "ran me up a wall"  . Penicillins Other (See Comments)    blisters  . Atorvastatin Other (See Comments)    REACTION: severe muscle aches  . Ezetimibe-Simvastatin Other (See Comments)    REACTION: severe muscle aches    Family History  Problem Relation Age of Onset  . Heart disease Mother   . Heart disease Brother   . Stroke Brother   . Cancer Neg Hx   . Diabetes Neg Hx   . Hypertension Neg Hx   . Heart disease Brother   . Stroke Brother   . Heart disease Brother   . Stroke Brother     Prior to Admission medications   Medication Sig Start Date End Date Taking? Authorizing Provider  albuterol (PROVENTIL HFA;VENTOLIN HFA) 108 (90 BASE) MCG/ACT inhaler Inhale 2 puffs into the lungs every 4 (  four) hours as needed for wheezing or shortness of breath.   Yes Historical Provider, MD  amiodarone (PACERONE) 200 MG tablet Take 1 tablet (200 mg total) by mouth 2 (two) times daily. 01/12/14  Yes Othella BoyerWilliam S Tilley, MD  amitriptyline (ELAVIL) 25 MG tablet Take 25 mg by mouth at bedtime.   Yes Historical Provider, MD  aspirin 81 MG tablet Take 81 mg by mouth every morning.    Yes Historical Provider, MD  carvedilol (COREG) 3.125 MG tablet Take 3.125 mg by mouth 2 (two) times daily with a meal.   Yes Historical Provider, MD  diltiazem (CARDIZEM CD) 300 MG 24 hr capsule Take 1 capsule  (300 mg total) by mouth daily. 04/16/13  Yes Sorin Luanne Bras Laza, MD  fluticasone (FLONASE) 50 MCG/ACT nasal spray Place 2 sprays into the nose daily. 2 spray, Each Nare, Daily 02/25/13  Yes Elease EtienneAnand D Hongalgi, MD  furosemide (LASIX) 40 MG tablet Take 1 tablet (40 mg total) by mouth 2 (two) times daily. 04/16/13  Yes Karie Schwalbeichard I Letvak, MD  guaiFENesin (ROBITUSSIN) 100 MG/5ML SOLN Take 5 mLs by mouth every 4 (four) hours as needed for cough or to loosen phlegm.   Yes Historical Provider, MD  HYDROcodone-acetaminophen (NORCO) 10-325 MG per tablet Take 1 tablet by mouth every 8 (eight) hours as needed for severe pain.   Yes Historical Provider, MD  ipratropium (ATROVENT HFA) 17 MCG/ACT inhaler Inhale 2 puffs into the lungs 4 (four) times daily. 11/02/13  Yes Karie Schwalbeichard I Letvak, MD  levothyroxine (SYNTHROID, LEVOTHROID) 25 MCG tablet Take 25 mcg by mouth daily before breakfast.   Yes Historical Provider, MD  oxymetazoline (AFRIN) 0.05 % nasal spray Place 2 sprays into both nostrils 2 (two) times daily.   Yes Historical Provider, MD  phenylephrine (NEO-SYNEPHRINE) 0.5 % nasal solution Place 1 drop into both nostrils every 6 (six) hours as needed for congestion.   Yes Historical Provider, MD  polyethylene glycol (MIRALAX / GLYCOLAX) packet Take 17 g by mouth daily as needed for mild constipation.    Yes Historical Provider, MD  potassium chloride SA (K-DUR,KLOR-CON) 20 MEQ tablet Take 1 tablet (20 mEq total) by mouth daily. 04/16/13  Yes Karie Schwalbeichard I Letvak, MD  RAPAFLO 8 MG CAPS Take 8 mg by mouth daily.  01/11/11  Yes Historical Provider, MD  warfarin (COUMADIN) 5 MG tablet Take 2.5-5 mg by mouth daily. Take 2.5 mg on Tuesday, Thursday, Saturday. Take 5 mg on all other days.   Yes Historical Provider, MD  nitroGLYCERIN (NITROSTAT) 0.4 MG SL tablet Place 0.4 mg under the tongue every 5 (five) minutes as needed for chest pain. x3 doses for for chest pain 10/19/12   Karie Schwalbeichard I Letvak, MD    Physical Exam: Filed Vitals:    01/20/14 2045 01/20/14 2200 01/20/14 2230 01/20/14 2302  BP: 119/86 132/57 131/56 117/67  Pulse: 75 85 83 81  Temp:      Resp: 29 30 18 18   SpO2: 93% 100% 100% 91%    General: Alert, Awake and Oriented to Time, Person. Appear in marked distress Eyes: PERRL ENT: Oral Mucosa clear moist. Neck: no JVD Cardiovascular: S1 and S2 Present, no Murmur, Peripheral Pulses Present Respiratory: Bilateral Air entry equal and Decreased, right-sided Crackles, no wheezes Abdomen: Bowel Sound Present, Soft and Non tender Skin: No Rash Extremities: No Pedal edema, no calf tenderness Neurologic: Grossly Unremarkable. Labs on Admission:  CBC:  Recent Labs Lab 01/20/14 2159 01/20/14 2210  WBC 17.1*  --  NEUTROABS 15.2*  --   HGB 11.6* 12.2*  HCT 33.3* 36.0*  MCV 87.4  --   PLT 362  --     CMP     Component Value Date/Time   NA 136* 01/20/2014 2210   K 4.3 01/20/2014 2210   CL 100 01/20/2014 2210   CO2 24 01/20/2014 2159   GLUCOSE 115* 01/20/2014 2210   BUN 31* 01/20/2014 2210   CREATININE 0.90 01/20/2014 2210   CALCIUM 9.7 01/20/2014 2159   PROT 6.7 04/12/2013 0535   ALBUMIN 3.2* 04/12/2013 0535   AST 27 04/12/2013 0535   ALT 27 04/12/2013 0535   ALKPHOS 105 04/12/2013 0535   BILITOT 0.6 04/12/2013 0535   GFRNONAA 77* 01/20/2014 2159   GFRAA 90* 01/20/2014 2159    No results found for this basename: LIPASE, AMYLASE,  in the last 168 hours No results found for this basename: AMMONIA,  in the last 168 hours  No results found for this basename: CKTOTAL, CKMB, CKMBINDEX, TROPONINI,  in the last 168 hours BNP (last 3 results)  Recent Labs  04/08/13 0818 01/08/14 2225 01/20/14 2159  PROBNP 1755.0* 1259.0* 1528.0*    Radiological Exams on Admission: Dg Chest 2 View  01/20/2014   CLINICAL DATA:  Shortness of breath  EXAM: CHEST  2 VIEW  COMPARISON:  01/08/2014  FINDINGS: Cardiac shadow is stable. New right upper lobe infiltrate is seen when compare with the prior exam. Diffuse fibrotic  changes are again identified throughout both lungs. Changes of prior granulomatous disease are noted as well.  IMPRESSION: New right upper lobe infiltrate superimposed over chronic changes bilaterally.   Electronically Signed   By: Alcide Clever M.D.   On: 01/20/2014 21:19    Assessment/Plan Principal Problem:   HCAP (healthcare-associated pneumonia) Active Problems:   CAD (coronary artery disease)   Atrial fibrillation   COPD   Hypothyroidism   Chronic diastolic heart failure   Acute delirium   1. HCAP (healthcare-associated pneumonia) The patient is presenting with complaints of altered mental status with productive cough and shortness of breath.  He has been hypoxic since last few days and was recommended home oxygen by his PCP. Today's chest x-ray is showing right-sided infiltrate. There is a high likelihood that the patient might have aspirated with his altered sensorium. I will treat the patient with IV vancomycin and cefepime due to his recent hospitalization. I will keep the patient n.p.o. obtain speech evaluation. Duo nebs every 4 hour as needed. Oxygen as needed  2. Acute delirium  The patient was recently hospitalized and now presenting with hypoxia and pneumonia. He has significantly elevated INR but a CT scan of the head does not show any acute abnormality. At present follow neuro checks and more likely his delirium is due to hypoxia and pneumonia. Avoid psychotropic medications  3. atrial fibrillation  At present rate control continue to monitor  Patient's INR is elevated I will give him low-dose vitamin K and repeat his INR tomorrow  At present warfarin on hold further orders per pharmacy   4. chronic diastolic dysfunction   at present the patient does not appear volume overloaded I would continue his Lasix at home dose tomorrow  DVT Prophylaxis: mechanical compression device Nutrition:  n.p.o.  Code Status:  full   Disposition: Admitted to inpatient in  telemetry unit.  Author: Lynden Oxford, MD [facility] Pager: [phone] 01/20/2014, 11:53 PM    If 7PM-7AM, please contact night-coverage [email or web address] Password [email or web address]

## 2014-01-20 NOTE — Telephone Encounter (Signed)
Discussed with daughter She will need to hire aides from [facility] right away Check with Shirlee LimerickMarion about the nurses  Told her if he refuses to take meds from them, he will need to go to [facility] She understands (they have already told him this)

## 2014-01-20 NOTE — ED Notes (Signed)
The patient is unable to give urine specimen at this time. The patient has been advised to use the call light for assistance to the restroom. The tech has reported to the RN in charge.

## 2014-01-20 NOTE — Telephone Encounter (Signed)
Relevant patient education mailed to patient.  

## 2014-01-21 ENCOUNTER — Encounter (HOSPITAL_COMMUNITY): Payer: Self-pay | Admitting: Radiology

## 2014-01-21 ENCOUNTER — Inpatient Hospital Stay (HOSPITAL_COMMUNITY): Payer: Medicare Other

## 2014-01-21 DIAGNOSIS — R410 Disorientation, unspecified: Secondary | ICD-10-CM | POA: Diagnosis present

## 2014-01-21 DIAGNOSIS — E43 Unspecified severe protein-calorie malnutrition: Secondary | ICD-10-CM

## 2014-01-21 DIAGNOSIS — N183 Chronic kidney disease, stage 3 unspecified: Secondary | ICD-10-CM

## 2014-01-21 DIAGNOSIS — I4891 Unspecified atrial fibrillation: Secondary | ICD-10-CM

## 2014-01-21 DIAGNOSIS — N400 Enlarged prostate without lower urinary tract symptoms: Secondary | ICD-10-CM

## 2014-01-21 DIAGNOSIS — E039 Hypothyroidism, unspecified: Secondary | ICD-10-CM

## 2014-01-21 DIAGNOSIS — J189 Pneumonia, unspecified organism: Principal | ICD-10-CM

## 2014-01-21 LAB — COMPREHENSIVE METABOLIC PANEL
ALBUMIN: 2.3 g/dL — AB (ref 3.5–5.2)
ALT: 27 U/L (ref 0–53)
AST: 29 U/L (ref 0–37)
Alkaline Phosphatase: 106 U/L (ref 39–117)
BUN: 25 mg/dL — AB (ref 6–23)
CALCIUM: 9.5 mg/dL (ref 8.4–10.5)
CO2: 24 mEq/L (ref 19–32)
CREATININE: 0.8 mg/dL (ref 0.50–1.35)
Chloride: 100 mEq/L (ref 96–112)
GFR calc Af Amer: >90 mL/min (ref >90)
GFR calc non Af Amer: 80 mL/min — ABNORMAL LOW (ref >90)
Glucose, Bld: 109 mg/dL — ABNORMAL HIGH (ref 70–99)
POTASSIUM: 4.2 meq/L (ref 3.7–5.3)
Sodium: 138 mEq/L (ref 137–147)
Total Bilirubin: 0.6 mg/dL (ref 0.3–1.2)
Total Protein: 6.8 g/dL (ref 6.0–8.3)

## 2014-01-21 LAB — LEGIONELLA ANTIGEN, URINE: LEGIONELLA ANTIGEN, URINE: NEGATIVE

## 2014-01-21 LAB — CBC WITH DIFFERENTIAL/PLATELET
Basophils Absolute: 0 10*3/uL (ref 0.0–0.1)
Basophils Relative: 0 % (ref 0–1)
EOS PCT: 0 % (ref 0–5)
Eosinophils Absolute: 0 10*3/uL (ref 0.0–0.7)
HEMATOCRIT: 30.9 % — AB (ref 39.0–52.0)
HEMOGLOBIN: 10.7 g/dL — AB (ref 13.0–17.0)
LYMPHS PCT: 3 % — AB (ref 12–46)
Lymphs Abs: 0.5 10*3/uL — ABNORMAL LOW (ref 0.7–4.0)
MCH: 30 pg (ref 26.0–34.0)
MCHC: 34.6 g/dL (ref 30.0–36.0)
MCV: 86.6 fL (ref 78.0–100.0)
MONOS PCT: 8 % (ref 3–12)
Monocytes Absolute: 1.2 10*3/uL — ABNORMAL HIGH (ref 0.1–1.0)
NEUTROS ABS: 13.6 10*3/uL — AB (ref 1.7–7.7)
Neutrophils Relative %: 89 % — ABNORMAL HIGH (ref 43–77)
Platelets: 386 10*3/uL (ref 150–400)
RBC: 3.57 MIL/uL — AB (ref 4.22–5.81)
RDW: 14 % (ref 11.5–15.5)
Smear Review: ADEQUATE
WBC: 15.3 10*3/uL — AB (ref 4.0–10.5)

## 2014-01-21 LAB — PROTIME-INR
INR: 6.14 — AB (ref 0.00–1.49)
INR: 2.32 — ABNORMAL HIGH (ref 0.00–1.49)
PROTHROMBIN TIME: 51.9 s — AB (ref 11.6–15.2)
Prothrombin Time: 24.7 seconds — ABNORMAL HIGH (ref 11.6–15.2)

## 2014-01-21 LAB — STREP PNEUMONIAE URINARY ANTIGEN: Strep Pneumo Urinary Antigen: NEGATIVE

## 2014-01-21 LAB — INFLUENZA PANEL BY PCR (TYPE A & B)
H1N1FLUPCR: NOT DETECTED
INFLAPCR: NEGATIVE
INFLBPCR: NEGATIVE

## 2014-01-21 MED ORDER — FLUTICASONE PROPIONATE 50 MCG/ACT NA SUSP
2.0000 | Freq: Every day | NASAL | Status: DC
  Administered 2014-01-21 – 2014-01-25 (×5): 2 via NASAL
  Filled 2014-01-21: qty 16

## 2014-01-21 MED ORDER — WARFARIN SODIUM 2.5 MG PO TABS
2.5000 mg | ORAL_TABLET | Freq: Once | ORAL | Status: AC
  Administered 2014-01-21: 2.5 mg via ORAL
  Filled 2014-01-21: qty 1

## 2014-01-21 MED ORDER — TAMSULOSIN HCL 0.4 MG PO CAPS
0.4000 mg | ORAL_CAPSULE | Freq: Every day | ORAL | Status: DC
  Administered 2014-01-21 – 2014-01-25 (×5): 0.4 mg via ORAL
  Filled 2014-01-21 (×6): qty 1

## 2014-01-21 MED ORDER — DILTIAZEM HCL ER COATED BEADS 300 MG PO CP24
300.0000 mg | ORAL_CAPSULE | Freq: Every day | ORAL | Status: DC
  Administered 2014-01-21 – 2014-01-24 (×4): 300 mg via ORAL
  Filled 2014-01-21 (×4): qty 1

## 2014-01-21 MED ORDER — IPRATROPIUM-ALBUTEROL 0.5-2.5 (3) MG/3ML IN SOLN
3.0000 mL | RESPIRATORY_TRACT | Status: DC | PRN
  Administered 2014-01-22: 3 mL via RESPIRATORY_TRACT
  Filled 2014-01-21 (×2): qty 3

## 2014-01-21 MED ORDER — ACETAMINOPHEN 325 MG PO TABS
650.0000 mg | ORAL_TABLET | Freq: Four times a day (QID) | ORAL | Status: DC | PRN
Start: 2014-01-21 — End: 2014-01-25
  Administered 2014-01-21 – 2014-01-23 (×4): 650 mg via ORAL
  Filled 2014-01-21 (×4): qty 2

## 2014-01-21 MED ORDER — VITAMIN K1 10 MG/ML IJ SOLN
2.5000 mg | Freq: Once | INTRAVENOUS | Status: AC
  Administered 2014-01-21: 2.5 mg via INTRAVENOUS
  Filled 2014-01-21: qty 0.25

## 2014-01-21 MED ORDER — CEFEPIME HCL 1 G IJ SOLR: 1.0000 g | Freq: Three times a day (TID) | INTRAMUSCULAR | Status: DC

## 2014-01-21 MED ORDER — CARVEDILOL 3.125 MG PO TABS
3.1250 mg | ORAL_TABLET | Freq: Two times a day (BID) | ORAL | Status: DC
  Administered 2014-01-21 – 2014-01-25 (×9): 3.125 mg via ORAL
  Filled 2014-01-21 (×11): qty 1

## 2014-01-21 MED ORDER — STARCH (THICKENING) PO POWD
ORAL | Status: DC | PRN
  Filled 2014-01-21: qty 227

## 2014-01-21 MED ORDER — LEVOTHYROXINE SODIUM 25 MCG PO TABS
25.0000 ug | ORAL_TABLET | Freq: Every day | ORAL | Status: DC
  Administered 2014-01-21 – 2014-01-25 (×5): 25 ug via ORAL
  Filled 2014-01-21 (×6): qty 1

## 2014-01-21 MED ORDER — ASPIRIN EC 81 MG PO TBEC
81.0000 mg | DELAYED_RELEASE_TABLET | Freq: Every morning | ORAL | Status: DC
  Administered 2014-01-22 – 2014-01-24 (×3): 81 mg via ORAL
  Filled 2014-01-21 (×5): qty 1

## 2014-01-21 MED ORDER — DEXTROSE 5 % IV SOLN
1.0000 g | INTRAVENOUS | Status: DC
  Administered 2014-01-22 – 2014-01-25 (×4): 1 g via INTRAVENOUS
  Filled 2014-01-21 (×5): qty 1

## 2014-01-21 MED ORDER — GUAIFENESIN 100 MG/5ML PO SOLN
5.0000 mL | ORAL | Status: DC | PRN
  Administered 2014-01-23 – 2014-01-24 (×4): 100 mg via ORAL
  Filled 2014-01-21 (×4): qty 5

## 2014-01-21 MED ORDER — ENSURE PUDDING PO PUDG
1.0000 | Freq: Three times a day (TID) | ORAL | Status: DC
  Administered 2014-01-21 – 2014-01-25 (×12): 1 via ORAL

## 2014-01-21 MED ORDER — AMIODARONE HCL 200 MG PO TABS
200.0000 mg | ORAL_TABLET | Freq: Two times a day (BID) | ORAL | Status: DC
  Administered 2014-01-21 – 2014-01-25 (×9): 200 mg via ORAL
  Filled 2014-01-21 (×11): qty 1

## 2014-01-21 MED ORDER — WARFARIN - PHARMACIST DOSING INPATIENT
Freq: Every day | Status: DC
  Administered 2014-01-21: 18:00:00

## 2014-01-21 MED ORDER — BUDESONIDE 0.25 MG/2ML IN SUSP
0.2500 mg | Freq: Two times a day (BID) | RESPIRATORY_TRACT | Status: DC
  Administered 2014-01-21 – 2014-01-25 (×9): 0.25 mg via RESPIRATORY_TRACT
  Filled 2014-01-21 (×11): qty 2

## 2014-01-21 NOTE — Evaluation (Signed)
Physical Therapy Evaluation Patient Details Name: [name] MRN: [id number] DOB: 1928/04/26 Today's Date: 01/21/2014 Time: 4098-1191 PT Time Calculation (min): 17 min  PT Assessment / Plan / Recommendation History of Present Illness  [name] is a 80 y.o. male with Past medical history of coronary artery disease, atrial fibrillation 4 recent admissions, dyslipidemia, COPD, GERD.  Admitted with AMS  Clinical Impression  Pt presents with decreased activity tolerance, decreased strength, mobility and gait and will benefit from acute PT services to address deficits and decrease burden of care.  Pt will benefit from SNF stay for further strengthening at d/c from hospital.    PT Assessment  Patient needs continued PT services    Follow Up Recommendations  SNF;Supervision/Assistance - 24 hour (needs SNF unless family can provide 24 hour care)    Does the patient have the potential to tolerate intense rehabilitation      Barriers to Discharge   per H and P pt was not taking medication or using oxygen as recommended, may need home services or SNF for management    Equipment Recommendations  None recommended by PT    Recommendations for Other Services     Frequency Min 3X/week    Precautions / Restrictions Precautions Precautions: Fall Precaution Comments: has sitter Restrictions Weight Bearing Restrictions: No   Pertinent Vitals/Pain No c/o pain, session performed on 2L O2 [place]      Mobility  Bed Mobility Overal bed mobility: Needs Assistance Bed Mobility: Supine to Sit Supine to sit: Supervision General bed mobility comments: uses rails, cues to not pull on IV, catheter, heart monitor Transfers Overall transfer level: Needs assistance Equipment used: Rolling walker (2 wheeled) Transfers: Sit to/from [employer] Sit to Stand: Mod assist General transfer comment: cues for safe hand placement, lifting assist.  Stand pivot transfer performed without AD  with pt reaching for armrest of recliner with min A Ambulation/Gait Ambulation/Gait assistance: Mod assist Ambulation Distance (Feet): 3 Feet Assistive device: Rolling walker (2 wheeled) General Gait Details: pt able to take 2 steps forward and backward, limited by fatigue. Pt requires mod A to prevent LOB backward    Exercises     PT Diagnosis: Difficulty walking;Generalized weakness  PT Problem List: Decreased strength;Decreased mobility;Decreased safety awareness;Decreased coordination;Decreased activity tolerance;Decreased balance;Decreased cognition;Cardiopulmonary status limiting activity PT Treatment Interventions: Gait training;Balance training;DME instruction;Therapeutic exercise;Wheelchair mobility training;Stair training;Neuromuscular re-education;Modalities;Functional mobility training;Cognitive remediation;Therapeutic activities;Patient/family education     PT Goals(Current goals can be found in the care plan section) Acute Rehab PT Goals Patient Stated Goal: none stated PT Goal Formulation: Patient unable to participate in goal setting Time For Goal Achievement: 02/04/14 Potential to Achieve Goals: Good  Visit Information  Last PT Received On: 01/21/14 Assistance Needed: +1 History of Present Illness: [name] is a 80 y.o. male with Past medical history of coronary artery disease, atrial fibrillation 4 recent admissions, dyslipidemia, COPD, GERD.  Admitted with AMS       Prior Functioning  Home Living Family/patient expects to be discharged to:: Private residence Living Arrangements: Spouse/significant other Available Help at Discharge: Family;Available 24 hours/day Type of Home: House Home Access: Stairs to enter Home Layout: One level Home Equipment: Environmental consultant - 2 wheels Additional Comments: pt unable to provide home information, obtained information from chart Prior Function Comments: pt states he uses a RW Communication Communication: No difficulties     Cognition  Cognition Arousal/Alertness: Awake/alert Behavior During Therapy: Impulsive Overall Cognitive Status: No family/caregiver present to determine baseline cognitive functioning  Extremity/Trunk Assessment Upper Extremity Assessment Upper Extremity Assessment: Generalized weakness Lower Extremity Assessment Lower Extremity Assessment: Generalized weakness Cervical / Trunk Assessment Cervical / Trunk Assessment: Normal   Balance Balance Overall balance assessment: Needs assistance Sitting-balance support: Bilateral upper extremity supported;Feet supported Sitting balance-Leahy Scale: Fair Sitting balance - Comments: pt requires min A after 2 minutes of sitting EOB due to fatigue  End of Session PT - End of Session Equipment Utilized During Treatment: Gait belt;Oxygen Activity Tolerance: Patient limited by fatigue Patient left: in chair;with nursing/sitter in room;with call bell/phone within reach;with chair alarm set Nurse Communication: Mobility status  GP     [name] 01/21/2014, 8:51 AM

## 2014-01-21 NOTE — Evaluation (Signed)
Clinical/Bedside Swallow Evaluation Patient Details  Name: [name] MRN: [id number] Date of Birth: 12-04-1928  Today's Date: 01/21/2014 Time: [id number] SLP Time Calculation (min): 30 min  Past Medical History:  Past Medical History  Diagnosis Date  . CAD (coronary artery disease)   . Hyperlipidemia   . Kidney stones   . BPH (benign prostatic hypertrophy)   . Osteoarthritis   . COPD (chronic obstructive pulmonary disease)   . Hypothyroidism   . Atrial fibrillation   . Impaired fasting glucose   . Myocardial infarction   . Hypertension   . Shortness of breath   . CHF (congestive heart failure)   . GERD (gastroesophageal reflux disease)    Past Surgical History:  Past Surgical History  Procedure Laterality Date  . Inguinal hernia repair  09/2004    left  . Back surgery  1990's    x3  . Transurethral resection of prostate  2002  . Pleural scarification  1980    right  . Esophagogastroduodenoscopy  04/2002    negative  . Bladder stone removal  06/2006  . Colonoscopy Left 04/13/2013    Procedure: COLONOSCOPY;  Surgeon: Willis ModenaWilliam Outlaw, MD;  [facility];  Service: Endoscopy;  Laterality: Left;   HPI:  Patient is an 80 y.o. male with PMH: COPD, GERD, dyslipidemia, atrial fibrillation, CAD, with four recent hospital admissions. Patient had recently been admitted for atrial fibrillation and diastolic dysfunction and dyspnea on exertion. Apparently, he has not been eating, not taking medications appropriately, not keeping his oxygen regularly, has a productive cough and confusion.   Assessment / Plan / Recommendation Clinical Impression  Patient presents with a moderate oropharyngeal dysphagia characterized by decreased laryngeal elevation and pharyngeal contraction, delayed throat clear and wet sounding vocal quality, as well as presence of wet-sounding, congested vocal quality and cough prior to , during and after PO's.     Aspiration Risk  Moderate    Diet  Recommendation NPO   Medication Administration: Crushed with puree    Other  Recommendations Recommended Consults: MBS Oral Care Recommendations: Oral care BID   Follow Up Recommendations       Frequency and Duration min 2x/week  2 weeks   Pertinent Vitals/Pain     SLP Swallow Goals  1. Patient will participate in MBSS to objectively assess swallow function and determine least restrictive diet.   Swallow Study Prior Functional Status       General Date of Onset: 01/20/14 HPI: Patient is an 80 y.o. male with PMH: COPD, GERD, dyslipidemia, atrial fibrillation, CAD, with four recent hospital admissions. Patient had recently been admitted for atrial fibrillation and diastolic dysfunction and dyspnea on exertion. Apparently, he has not been eating, not taking medications appropriately, not keeping his oxygen regularly, has a productive cough and confusion. Type of Study: Bedside swallow evaluation Previous Swallow Assessment: N/A Diet Prior to this Study: NPO Temperature Spikes Noted: No Respiratory Status: Nasal cannula History of Recent Intubation: No Behavior/Cognition: Alert;Confused;Pleasant mood;Requires cueing Oral Cavity - Dentition: Adequate natural dentition Self-Feeding Abilities: Needs assist Patient Positioning: Upright in chair Baseline Vocal Quality: Low vocal intensity;Wet Volitional Cough: Weak Volitional Swallow: Unable to elicit    Oral/Motor/Sensory Function Overall Oral Motor/Sensory Function: Impaired Lingual Strength: Reduced   Ice Chips Ice chips: Not tested   Thin Liquid Thin Liquid: Impaired Presentation: Cup Pharyngeal  Phase Impairments: Suspected delayed Swallow;Decreased hyoid-laryngeal movement;Wet Vocal Quality;Throat Clearing - Delayed    Nectar Thick Nectar Thick Liquid: Impaired Presentation: Cup Pharyngeal Phase Impairments: Suspected  delayed Swallow   Honey Thick Honey Thick Liquid: Not tested   Puree Puree: Impaired Presentation:  Spoon Pharyngeal Phase Impairments: Suspected delayed Swallow;Decreased hyoid-laryngeal movement   Solid   GO    Solid: Not tested       [name] [name] 01/21/2014,4:14 PM   Angela Nevin, MA, CCC-SLP [facility] Speech-Language Pathologist

## 2014-01-21 NOTE — Progress Notes (Signed)
Received pt from the ED per stretcher with IV Cefepime going on and O2 at 2L per nasal canula. Alert, oriented to himself, disoriented to time and place. Put on telemetry and bed alarm. Advised on droplet precaution. Flu PCR per nasal swab done and sent to the lab. Received a call from the lab INR was 6.14 and relayed to Dr. Allena KatzPatel, ordered for IV Vit K and stat CT Head. Will continue to moitor pt.

## 2014-01-21 NOTE — Progress Notes (Signed)
In to visit patient as bedside.  He was in a deep restful sleep and has a sitter for safety at bedside.  Will follow up on next week.  Of note, [facility] services does not replace or interfere with any services that are arranged by inpatient case management or social work.  For additional questions or referrals please contact Anibal Hendersonim Henderson BSN RN [facility] Hospital Liaison at [phone].

## 2014-01-21 NOTE — Progress Notes (Signed)
ANTICOAGULATION CONSULT NOTE - Initial Consult  Pharmacy Consult for Coumadin Indication: atrial fibrillation  Allergies  Allergen Reactions  . Fenofibrate Other (See Comments)    REACTION: "ran me up a wall"  . Penicillins Other (See Comments)    blisters  . Atorvastatin Other (See Comments)    REACTION: severe muscle aches  . Ezetimibe-Simvastatin Other (See Comments)    REACTION: severe muscle aches    Patient Measurements: Height: 6' (182.9 cm) Weight: 110 lb 7.2 oz (50.1 kg) IBW/kg (Calculated) : 77.6  Vital Signs: Temp: 98.2 F (36.8 C) (01/30 0030) BP: 133/59 mmHg (01/30 0030) Pulse Rate: 81 (01/30 0030)  Labs:  Recent Labs  01/20/14 2159 01/20/14 2210  HGB 11.6* 12.2*  HCT 33.3* 36.0*  PLT 362  --   LABPROT 51.9*  --   INR 6.14*  --   CREATININE 0.88 0.90    Estimated Creatinine Clearance: 44.1 ml/min (by C-G formula based on Cr of 0.9).   Medical History: Past Medical History  Diagnosis Date  . CAD (coronary artery disease)   . Hyperlipidemia   . Kidney stones   . BPH (benign prostatic hypertrophy)   . Osteoarthritis   . COPD (chronic obstructive pulmonary disease)   . Hypothyroidism   . Atrial fibrillation   . Impaired fasting glucose   . Myocardial infarction   . Hypertension   . Shortness of breath   . CHF (congestive heart failure)   . GERD (gastroesophageal reflux disease)     Medications:  Prescriptions prior to admission  Medication Sig Dispense Refill  . albuterol (PROVENTIL HFA;VENTOLIN HFA) 108 (90 BASE) MCG/ACT inhaler Inhale 2 puffs into the lungs every 4 (four) hours as needed for wheezing or shortness of breath.      Marland Kitchen. amiodarone (PACERONE) 200 MG tablet Take 1 tablet (200 mg total) by mouth 2 (two) times daily.  30 tablet  12  . amitriptyline (ELAVIL) 25 MG tablet Take 25 mg by mouth at bedtime.      Marland Kitchen. aspirin 81 MG tablet Take 81 mg by mouth every morning.       . carvedilol (COREG) 3.125 MG tablet Take 3.125 mg by mouth  2 (two) times daily with a meal.      . diltiazem (CARDIZEM CD) 300 MG 24 hr capsule Take 1 capsule (300 mg total) by mouth daily.  30 capsule  0  . fluticasone (FLONASE) 50 MCG/ACT nasal spray Place 2 sprays into the nose daily. 2 spray, Each Nare, Daily  16 g  0  . furosemide (LASIX) 40 MG tablet Take 1 tablet (40 mg total) by mouth 2 (two) times daily.  60 tablet  11  . guaiFENesin (ROBITUSSIN) 100 MG/5ML SOLN Take 5 mLs by mouth every 4 (four) hours as needed for cough or to loosen phlegm.      Marland Kitchen. HYDROcodone-acetaminophen (NORCO) 10-325 MG per tablet Take 1 tablet by mouth every 8 (eight) hours as needed for severe pain.      Marland Kitchen. ipratropium (ATROVENT HFA) 17 MCG/ACT inhaler Inhale 2 puffs into the lungs 4 (four) times daily.  3 Inhaler  3  . levothyroxine (SYNTHROID, LEVOTHROID) 25 MCG tablet Take 25 mcg by mouth daily before breakfast.      . oxymetazoline (AFRIN) 0.05 % nasal spray Place 2 sprays into both nostrils 2 (two) times daily.      . phenylephrine (NEO-SYNEPHRINE) 0.5 % nasal solution Place 1 drop into both nostrils every 6 (six) hours as needed for congestion.      .Marland Kitchen  polyethylene glycol (MIRALAX / GLYCOLAX) packet Take 17 g by mouth daily as needed for mild constipation.       . potassium chloride SA (K-DUR,KLOR-CON) 20 MEQ tablet Take 1 tablet (20 mEq total) by mouth daily.  30 tablet  11  . RAPAFLO 8 MG CAPS Take 8 mg by mouth daily.       Marland Kitchen warfarin (COUMADIN) 5 MG tablet Take 2.5-5 mg by mouth daily. Take 2.5 mg on Tuesday, Thursday, Saturday. Take 5 mg on all other days.      . nitroGLYCERIN (NITROSTAT) 0.4 MG SL tablet Place 0.4 mg under the tongue every 5 (five) minutes as needed for chest pain. x3 doses for for chest pain       Scheduled:  . amiodarone  200 mg Oral BID  . aspirin  81 mg Oral q morning - 10a  . carvedilol  3.125 mg Oral BID WC  . ceFEPime (MAXIPIME) IV  1 g Intravenous Q8H  . ceFEPime (MAXIPIME) IV  1 g Intravenous Q8H  . diltiazem  300 mg Oral Daily  .  fluticasone  2 spray Each Nare Daily  . levothyroxine  25 mcg Oral QAC breakfast  . tamsulosin  0.4 mg Oral QPC breakfast  . vancomycin  1,000 mg Intravenous Q24H    Assessment: 80yo male c/o CHF without improvement since discharge, started on ABX, also to continue Coumadin for Afib; presents now w/ supratherapeutic INR, likely d/t amiodarone started 1/19 on last admission, had INR at goal on 1/21 though given kinetics of amio can take a while to affect INR; H/H down slightly.  Pt last took Coumadin at home on 1/29.  Goal of Therapy:  INR 2-3   Plan:  Will hold Coumadin for now and monitor INR for appropriate adjustments; vitamin K likely not warrented unless bleeding.  Vernard Gambles, PharmD, BCPS  01/21/2014,12:39 AM

## 2014-01-21 NOTE — Progress Notes (Signed)
Utilization review completed. Aedon Deason, RN, BSN. 

## 2014-01-21 NOTE — Progress Notes (Signed)
INITIAL NUTRITION ASSESSMENT  DOCUMENTATION CODES Per approved criteria  -Severe malnutrition in the context of chronic illness -Underweight   INTERVENTION: Diet advancement per MD discretion Provide Magic Cup BID when diet advanced Provide Resource Breeze once daily when diet advanced  NUTRITION DIAGNOSIS: Inadequate oral intake related to poor appetite as evidenced by 10% weight loss in less than 3 months.   Goal: Pt to meet >/= 90% of their estimated nutrition needs   Monitor:  Diet advancement PO intake Weight trends Labs  Reason for Assessment: Malnutrition Screening Tool, score of 2  80 y.o. male  Admitting Dx: HCAP (healthcare-associated pneumonia)  ASSESSMENT: 80 y.o. male with Past medical history of coronary artery disease, atrial fibrillation 4 recent admissions, dyslipidemia, COPD, GERD. Admitted with AMS.  Pt seemingly a little confused and lethargic at time of visit. He states he has no appetite and reports poor appetite PTA. He reports using a nutritional supplement PTA but, he doesn't remember what it was called. Pt states he likes [employer]. Weight history shows 10% weight loss within the past 3 months. Pt currently NPO.   Nutrition Focused Physical Exam:  Subcutaneous Fat:  Orbital Region: mild wasting Upper Arm Region: moderate wasting Thoracic and Lumbar Region: NA  Muscle:  Temple Region: mild wasting Clavicle Bone Region: severe wasting Clavicle and Acromion Bone Region: mild wasting Scapular Bone Region: NA Dorsal Hand: mild wasting Patellar Region: severe wasting Anterior Thigh Region: moderate wasting Posterior Calf Region: moderate wasting  Edema: none  Pt meets criteria for SEVERE MALNUTRITION in the context of CHRONIC ILLNESS as evidenced by 10% weight loss in less than 3 months and signs of severe muscle wasting evidenced in physical exam.   Height: Ht Readings from Last 1 Encounters:  01/21/14 6' (1.829 m)  Question accuracy of  height. Documented height in chart was 5\' 7"  on 01/09/14  Weight: Wt Readings from Last 1 Encounters:  01/21/14 110 lb 7.2 oz (50.1 kg)    Ideal Body Weight: 178 lbs  % Ideal Body Weight: 62%  Wt Readings from Last 10 Encounters:  01/21/14 110 lb 7.2 oz (50.1 kg)  01/19/14 112 lb (50.803 kg)  01/12/14 113 lb 1.5 oz (51.3 kg)  11/02/13 122 lb (55.339 kg)  07/02/13 124 lb (56.246 kg)  06/01/13 122 lb (55.339 kg)  04/22/13 122 lb (55.339 kg)  04/16/13 117 lb (53.071 kg)  04/16/13 117 lb (53.071 kg)  03/11/13 128 lb (58.06 kg)    Usual Body Weight: 122 lbs  % Usual Body Weight: 90%  BMI:  Body mass index is 14.98 kg/(m^2). (underweight)   Estimated Nutritional Needs: Kcal: 1540-1760 Protein: 70-80 grams Fluid: 1.5 L/day  Skin: intact  Diet Order: NPO  EDUCATION NEEDS: -No education needs identified at this time   Intake/Output Summary (Last 24 hours) at 01/21/14 1125 Last data filed at 01/21/14 0925  Gross per 24 hour  Intake    100 ml  Output    600 ml  Net   -500 ml    Last BM: 1/29   Labs:   Recent Labs Lab 01/20/14 2159 01/20/14 2210 01/21/14 0410  NA 138 136* 138  K 4.6 4.3 4.2  CL 98 100 100  CO2 24  --  24  BUN 29* 31* 25*  CREATININE 0.88 0.90 0.80  CALCIUM 9.7  --  9.5  GLUCOSE 110* 115* 109*    CBG (last 3)  No results found for this basename: GLUCAP,  in the last 72 hours  Scheduled Meds: . amiodarone  200 mg Oral BID  . aspirin EC  81 mg Oral q morning - 10a  . budesonide (PULMICORT) nebulizer solution  0.25 mg Nebulization BID  . carvedilol  3.125 mg Oral BID WC  . ceFEPime (MAXIPIME) IV  1 g Intravenous Q8H  . diltiazem  300 mg Oral Daily  . fluticasone  2 spray Each Nare Daily  . levothyroxine  25 mcg Oral QAC breakfast  . tamsulosin  0.4 mg Oral QPC breakfast  . vancomycin  1,000 mg Intravenous Q24H  . Warfarin - Pharmacist Dosing Inpatient   Does not apply q1800    Continuous Infusions:   Past Medical History   Diagnosis Date  . CAD (coronary artery disease)   . Hyperlipidemia   . Kidney stones   . BPH (benign prostatic hypertrophy)   . Osteoarthritis   . COPD (chronic obstructive pulmonary disease)   . Hypothyroidism   . Atrial fibrillation   . Impaired fasting glucose   . Myocardial infarction   . Hypertension   . Shortness of breath   . CHF (congestive heart failure)   . GERD (gastroesophageal reflux disease)     Past Surgical History  Procedure Laterality Date  . Inguinal hernia repair  09/2004    left  . Back surgery  1990's    x3  . Transurethral resection of prostate  2002  . Pleural scarification  1980    right  . Esophagogastroduodenoscopy  04/2002    negative  . Bladder stone removal  06/2006  . Colonoscopy Left 04/13/2013    Procedure: COLONOSCOPY;  Surgeon: Willis ModenaWilliam Outlaw, MD;  [facility];  Service: Endoscopy;  Laterality: Left;    Ian Malkineanne Barnett RD, LDN Inpatient Clinical Dietitian Pager: [phone] After Hours Pager: [phone]

## 2014-01-21 NOTE — Progress Notes (Addendum)
[facility] PROGRESS NOTE  [name] ZOX:[id number] DOB: June 21, 1928 DOA: 01/20/2014 PCP: Tillman Abide, MD  Assessment/Plan: 1. Acute resp failure with hypoxia: appears to be secondary to PNA -continue IV antibiotics -start pulmicort -PRN nebs and oxygen supplementation  2. Toxic encephalopathy: due to PNa and hypoxia most likely. Patient with underlying dementia. -will continue tx for PNA -continue supportive care  3. Dysphagia: most likely due to encephalopathy  -per speech evaluation and results of MBS will start nectar thick diet, dysphagia 1 and crushed meds  4. Elevated INR: s/p Vit K -INR 2.3 -continue coumadin per pharmacy  5.BPH: continue flomax  6. Hypothyroidism: continue synthroid  7. Atrial fibrillation: continue amiodarone, diltiazem, coreg  and coumadin per pharmacy.  8.HTN: stable and well controlled. 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 Continue monitoring  10-severe protein calorie malnutrition: will start ensure pudding.  Code Status: Full Family Communication: no family at bedside Disposition Plan: to be determine    Consultants:  None   Procedures:  See below for x-ray reports   Antibiotics:  vanc and cefepime   HPI/Subjective: Reports he is feeling slightly better, no CP. Good O2 sat on 2 L  Objective: Filed Vitals:   01/21/14 1743  BP: 116/58  Pulse: 65  Temp:   Resp:     Intake/Output Summary (Last 24 hours) at 01/21/14 1809 Last data filed at 01/21/14 1646  Gross per 24 hour  Intake    220 ml  Output    600 ml  Net   -380 ml   Filed Weights   01/21/14 0030  Weight: 50.1 kg (110 lb 7.2 oz)    Exam:   General:  Afebrile, AAOX2, calm and cooperative with examination  Cardiovascular: s1 and s2, no rubs or gallops  Respiratory: coarse BS, diffuse rhonchi, slight exp wheezing  Abdomen: soft, NT, ND, positive BS  Musculoskeletal: no edema, no cyanosis  Data Reviewed: Basic  Metabolic Panel:  Recent Labs Lab 01/20/14 2159 01/20/14 2210 01/21/14 0410  NA 138 136* 138  K 4.6 4.3 4.2  CL 98 100 100  CO2 24  --  24  GLUCOSE 110* 115* 109*  BUN 29* 31* 25*  CREATININE 0.88 0.90 0.80  CALCIUM 9.7  --  9.5   Liver Function Tests:  Recent Labs Lab 01/21/14 0410  AST 29  ALT 27  ALKPHOS 106  BILITOT 0.6  PROT 6.8  ALBUMIN 2.3*   CBC:  Recent Labs Lab 01/20/14 2159 01/20/14 2210 01/21/14 0410  WBC 17.1*  --  15.3*  NEUTROABS 15.2*  --  13.6*  HGB 11.6* 12.2* 10.7*  HCT 33.3* 36.0* 30.9*  MCV 87.4  --  86.6  PLT 362  --  386   BNP (last 3 results)  Recent Labs  04/08/13 0818 01/08/14 2225 01/20/14 2159  PROBNP 1755.0* 1259.0* 1528.0*    Studies: Dg Chest 2 View  01/20/2014   CLINICAL DATA:  Shortness of breath  EXAM: CHEST  2 VIEW  COMPARISON:  01/08/2014  FINDINGS: Cardiac shadow is stable. New right upper lobe infiltrate is seen when compare with the prior exam. Diffuse fibrotic changes are again identified throughout both lungs. Changes of prior granulomatous disease are noted as well.  IMPRESSION: New right upper lobe infiltrate superimposed over chronic changes bilaterally.   Electronically Signed   By: Alcide Clever M.D.   On: 01/20/2014 21:19   Ct Head Wo Contrast  01/21/2014   CLINICAL DATA:  Altered level of  consciousness  EXAM: CT HEAD WITHOUT CONTRAST  TECHNIQUE: Contiguous axial images were obtained from the base of the skull through the vertex without intravenous contrast.  COMPARISON:  None.  FINDINGS: Prominence of the sulci, cisterns, and ventricles, in keeping with volume loss. Periventricular and subcortical white matter hypodensities are advanced and most in keeping with chronic microangiopathic change. No overt hydrocephalus. No definite CT evidence of an acute infarction. No intraparenchymal hemorrhage, mass, mass effect, or abnormal extra-axial fluid collection. The visualized paranasal sinuses and mastoid air cells are  predominantly clear.  IMPRESSION: Volume loss and white matter changes as above. No definite CT evidence of acute intracranial abnormality.   Electronically Signed   By: Jearld LeschAndrew  DelGaizo M.D.   On: 01/21/2014 02:06   Dg Swallowing Func-speech Pathology  01/21/2014   Pablo LawrenceJohn [name] Preston, CCC-SLP     01/21/2014  4:30 PM Objective Swallowing Evaluation: Modified Barium Swallowing Study   Patient Details  Name: [name] GowdaDanah Holland MRN: [id number] Date of Birth: [birth year]  Today's Date: 01/21/2014 Time: 1120-1135 SLP Time Calculation (min): 15 min  Past Medical History:  Past Medical History  Diagnosis Date  . CAD (coronary artery disease)   . Hyperlipidemia   . Kidney stones   . BPH (benign prostatic hypertrophy)   . Osteoarthritis   . COPD (chronic obstructive pulmonary disease)   . Hypothyroidism   . Atrial fibrillation   . Impaired fasting glucose   . Myocardial infarction   . Hypertension   . Shortness of breath   . CHF (congestive heart failure)   . GERD (gastroesophageal reflux disease)    Past Surgical History:  Past Surgical History  Procedure Laterality Date  . Inguinal hernia repair  09/2004    left  . Back surgery  1990's    x3  . Transurethral resection of prostate  2002  . Pleural scarification  1980    right  . Esophagogastroduodenoscopy  04/2002    negative  . Bladder stone removal  06/2006  . Colonoscopy Left 04/13/2013    Procedure: COLONOSCOPY;  Surgeon: Willis ModenaWilliam Outlaw, MD;   [facility];  Service: Endoscopy;  Laterality: Left;   HPI:  Patient is an 80 y.o. male with PMH: COPD, GERD, dyslipidemia,  atrial fibrillation, CAD, with four recent hospital admissions.  Patient had recently been admitted for atrial fibrillation and  diastolic dysfunction and dyspnea on exertion. Apparently, he has  not been eating, not taking medications appropriately, not  keeping his oxygen regularly, has a productive cough and  confusion.     Assessment / Plan / Recommendation Clinical Impression  Dysphagia Diagnosis:  Moderate oral phase dysphagia;Severe  pharyngeal phase dysphagia Clinical impression: Patient presents with a moderate oral and  moderate-severe pharyngeal phase dysphagia. Oral phase  characterized by decreased lingual ROM and strength, decreased  mastication and lingual manipulation. Pharyngeal phase  characterized by swallow initiation delays to vallecular sinus,  decreased hyolaryngeal excursion, poor epiglottic closure to  protect airway and moderate vallecular residuals remaining post  initial swallow secondary to pharyngeal weakness and poor  laryngeal elevation. Patient exhibited flash penetration with  nectar thick liquids and deep penetration which did not fully  clear with thin liquids.    Treatment Recommendation  Therapy as outlined in treatment plan below    Diet Recommendation Dysphagia 1 (Puree);Nectar-thick liquid   Liquid Administration via: Cup;No straw Medication Administration: Crushed with puree Supervision: Full supervision/cueing for compensatory strategies Compensations: Slow rate;Small sips/bites;Follow solids with  liquid;Multiple dry swallows after each bite/sip Postural  Changes and/or Swallow Maneuvers: Seated upright 90  degrees;Upright 30-60 min after meal    Other  Recommendations Recommended Consults: MBS Oral Care Recommendations: Oral care BID Other Recommendations: Order thickener from pharmacy;Prohibited  food (jello, ice cream, thin soups);Remove water pitcher;Clarify  dietary restrictions   Follow Up Recommendations  24 hour supervision/assistance;Skilled Nursing facility    Frequency and Duration min 2x/week  2 weeks   Pertinent Vitals/Pain     SLP Swallow Goals  Patient will tolerate Dys 1 (puree), nectar thick liquids diet  with moderate cues to follow swallow precautions and no overt s/s  aspiration.   General Date of Onset: 01/20/14 HPI: Patient is an 80 y.o. male with PMH: COPD, GERD,  dyslipidemia, atrial fibrillation, CAD, with four recent hospital  admissions. Patient  had recently been admitted for atrial  fibrillation and diastolic dysfunction and dyspnea on exertion.  Apparently, he has not been eating, not taking medications  appropriately, not keeping his oxygen regularly, has a productive  cough and confusion. Type of Study: Modified Barium Swallowing Study Reason for Referral: Objectively evaluate swallowing function Previous Swallow Assessment: BSE Diet Prior to this Study: NPO Temperature Spikes Noted: No Respiratory Status: Nasal cannula History of Recent Intubation: No Behavior/Cognition: Alert;Cooperative;Pleasant mood;Requires  cueing;Confused Oral Cavity - Dentition: Adequate natural dentition Self-Feeding Abilities: Needs assist Patient Positioning: Upright in chair Baseline Vocal Quality: Low vocal intensity;Wet Volitional Cough: Weak Volitional Swallow: Unable to elicit Pharyngeal Secretions: Not observed secondary MBS    Reason for Referral Objectively evaluate swallowing function   Oral Phase Oral Preparation/Oral Phase Oral Phase: Impaired Oral - Solids Oral - Puree: Weak lingual manipulation;Lingual pumping;Reduced  posterior propulsion Oral - Regular: Impaired mastication;Reduced posterior  propulsion;Weak lingual manipulation   Pharyngeal Phase Pharyngeal Phase Pharyngeal Phase: Impaired Pharyngeal - Honey Pharyngeal - Honey Teaspoon: Premature spillage to  valleculae;Delayed swallow initiation;Reduced tongue base  retraction;Reduced airway/laryngeal closure;Pharyngeal residue -  valleculae Pharyngeal - Nectar Pharyngeal - Nectar Cup: Delayed swallow initiation;Premature  spillage to valleculae;Pharyngeal residue - valleculae;Reduced  tongue base retraction;Reduced airway/laryngeal  closure;Penetration/Aspiration during swallow Penetration/Aspiration details (nectar cup): Material enters  airway, remains ABOVE vocal cords then ejected out Pharyngeal - Thin Pharyngeal - Thin Cup: Premature spillage to  valleculae;Pharyngeal residue -  valleculae;Penetration/Aspiration  after swallow;Reduced airway/laryngeal closure Penetration/Aspiration details (thin cup): Material enters  airway, CONTACTS cords and not ejected out Pharyngeal - Solids Pharyngeal - Puree: Delayed swallow initiation;Reduced laryngeal  elevation;Reduced tongue base retraction;Pharyngeal residue -  valleculae Pharyngeal - Regular: Delayed swallow initiation;Premature  spillage to valleculae;Pharyngeal residue - valleculae;Reduced  tongue base retraction  Cervical Esophageal Phase    GO    Cervical Esophageal Phase Cervical Esophageal Phase: Leonarda Salon         Pablo Lawrence 01/21/2014, 4:29 PM  Angela Nevin, MA, CCC-SLP [facility] Speech-Language Pathologist      Scheduled Meds: . amiodarone  200 mg Oral BID  . aspirin EC  81 mg Oral q morning - 10a  . budesonide (PULMICORT) nebulizer solution  0.25 mg Nebulization BID  . carvedilol  3.125 mg Oral BID WC  . [START ON 01/22/2014] ceFEPime (MAXIPIME) IV  1 g Intravenous Q24H  . diltiazem  300 mg Oral Daily  . fluticasone  2 spray Each Nare Daily  . levothyroxine  25 mcg Oral QAC breakfast  . tamsulosin  0.4 mg Oral QPC breakfast  . vancomycin  1,000 mg Intravenous Q24H  . Warfarin - Pharmacist Dosing Inpatient   Does not apply q1800    Time  spent: >30 minutes   Recardo Linn  [facility] Pager [phone]. If 7PM-7AM, please contact night-coverage at [email or web address], password [facility] 01/21/2014, 6:09 PM  LOS: 1 day

## 2014-01-21 NOTE — Progress Notes (Signed)
ANTICOAGULATION CONSULT NOTE - Follow Up Consult  Pharmacy Consult for Coumadin Indication: atrial fibrillation  Allergies  Allergen Reactions  . Fenofibrate Other (See Comments)    REACTION: "ran me up a wall"  . Penicillins Other (See Comments)    blisters  . Atorvastatin Other (See Comments)    REACTION: severe muscle aches  . Ezetimibe-Simvastatin Other (See Comments)    REACTION: severe muscle aches    Patient Measurements: Height: 6' (182.9 cm) Weight: 110 lb 7.2 oz (50.1 kg) IBW/kg (Calculated) : 77.6 Heparin Dosing Weight:   Vital Signs: Temp: 97.9 F (36.6 C) (01/30 0423) Temp src: Oral (01/30 0423) BP: 124/54 mmHg (01/30 1100) Pulse Rate: 72 (01/30 1100)  Labs:  Recent Labs  01/20/14 2159 01/20/14 2210 01/21/14 0410  HGB 11.6* 12.2* 10.7*  HCT 33.3* 36.0* 30.9*  PLT 362  --  386  LABPROT 51.9*  --  24.7*  INR 6.14*  --  2.32*  CREATININE 0.88 0.90 0.80    Estimated Creatinine Clearance: 49.6 ml/min (by C-G formula based on Cr of 0.8).   Medications:  Scheduled:  . amiodarone  200 mg Oral BID  . aspirin EC  81 mg Oral q morning - 10a  . budesonide (PULMICORT) nebulizer solution  0.25 mg Nebulization BID  . carvedilol  3.125 mg Oral BID WC  . ceFEPime (MAXIPIME) IV  1 g Intravenous Q8H  . diltiazem  300 mg Oral Daily  . fluticasone  2 spray Each Nare Daily  . levothyroxine  25 mcg Oral QAC breakfast  . tamsulosin  0.4 mg Oral QPC breakfast  . vancomycin  1,000 mg Intravenous Q24H  . Warfarin - Pharmacist Dosing Inpatient   Does not apply q1800    Assessment: 80yo male with AFib, admitted with supratherapeutic INR in context of recently added Amiodarone.  Now s/p Vit K with INR 2.32 this AM.  No bleeding noted.  Hg 10.7 and pltc wnl.  Goal of Therapy:  INR 2-3 Monitor platelets by anticoagulation protocol: Yes   Plan:  1-  Coumadin 2.5mg  2-  F/U in AM  Marisue HumbleKendra Eugune Sine, PharmD Clinical Pharmacist [facility]

## 2014-01-21 NOTE — Procedures (Signed)
Objective Swallowing Evaluation: Modified Barium Swallowing Study  Patient Details  Name: [name] GowdaDanah Mixon MRN: [id number] Date of Birth: [birth year]  Today's Date: 01/21/2014 Time: 1120-1135 SLP Time Calculation (min): 15 min  Past Medical History:  Past Medical History  Diagnosis Date  . CAD (coronary artery disease)   . Hyperlipidemia   . Kidney stones   . BPH (benign prostatic hypertrophy)   . Osteoarthritis   . COPD (chronic obstructive pulmonary disease)   . Hypothyroidism   . Atrial fibrillation   . Impaired fasting glucose   . Myocardial infarction   . Hypertension   . Shortness of breath   . CHF (congestive heart failure)   . GERD (gastroesophageal reflux disease)    Past Surgical History:  Past Surgical History  Procedure Laterality Date  . Inguinal hernia repair  09/2004    left  . Back surgery  1990's    x3  . Transurethral resection of prostate  2002  . Pleural scarification  1980    right  . Esophagogastroduodenoscopy  04/2002    negative  . Bladder stone removal  06/2006  . Colonoscopy Left 04/13/2013    Procedure: COLONOSCOPY;  Surgeon: Willis ModenaWilliam Outlaw, MD;  Location: [facility] END[facility];  Service: Endoscopy;  Laterality: Left;   HPI:  Patient is an 80 y.o. male with PMH: COPD, GERD, dyslipidemia, atrial fibrillation, CAD, with four recent hospital admissions. Patient had recently been admitted for atrial fibrillation and diastolic dysfunction and dyspnea on exertion. Apparently, he has not been eating, not taking medications appropriately, not keeping his oxygen regularly, has a productive cough and confusion.     Assessment / Plan / Recommendation Clinical Impression  Dysphagia Diagnosis: Moderate oral phase dysphagia;Severe pharyngeal phase dysphagia Clinical impression: Patient presents with a moderate oral and moderate-severe pharyngeal phase dysphagia. Oral phase characterized by decreased lingual ROM and strength, decreased mastication and lingual  manipulation. Pharyngeal phase characterized by swallow initiation delays to vallecular sinus, decreased hyolaryngeal excursion, poor epiglottic closure to protect airway and moderate vallecular residuals remaining post initial swallow secondary to pharyngeal weakness and poor laryngeal elevation. Patient exhibited flash penetration with nectar thick liquids and deep penetration which did not fully clear with thin liquids.    Treatment Recommendation  Therapy as outlined in treatment plan below    Diet Recommendation Dysphagia 1 (Puree);Nectar-thick liquid   Liquid Administration via: Cup;No straw Medication Administration: Crushed with puree Supervision: Full supervision/cueing for compensatory strategies Compensations: Slow rate;Small sips/bites;Follow solids with liquid;Multiple dry swallows after each bite/sip Postural Changes and/or Swallow Maneuvers: Seated upright 90 degrees;Upright 30-60 min after meal    Other  Recommendations Recommended Consults: MBS Oral Care Recommendations: Oral care BID Other Recommendations: Order thickener from pharmacy;Prohibited food (jello, ice cream, thin soups);Remove water pitcher;Clarify dietary restrictions   Follow Up Recommendations  24 hour supervision/assistance;Skilled Nursing facility    Frequency and Duration min 2x/week  2 weeks   Pertinent Vitals/Pain     SLP Swallow Goals  Patient will tolerate Dys 1 (puree), nectar thick liquids diet with moderate cues to follow swallow precautions and no overt s/s aspiration.   General Date of Onset: 01/20/14 HPI: Patient is an 80 y.o. male with PMH: COPD, GERD, dyslipidemia, atrial fibrillation, CAD, with four recent hospital admissions. Patient had recently been admitted for atrial fibrillation and diastolic dysfunction and dyspnea on exertion. Apparently, he has not been eating, not taking medications appropriately, not keeping his oxygen regularly, has a productive cough and confusion. Type of  Study: Modified Barium Swallowing Study  Reason for Referral: Objectively evaluate swallowing function Previous Swallow Assessment: BSE Diet Prior to this Study: NPO Temperature Spikes Noted: No Respiratory Status: Nasal cannula History of Recent Intubation: No Behavior/Cognition: Alert;Cooperative;Pleasant mood;Requires cueing;Confused Oral Cavity - Dentition: Adequate natural dentition Self-Feeding Abilities: Needs assist Patient Positioning: Upright in chair Baseline Vocal Quality: Low vocal intensity;Wet Volitional Cough: Weak Volitional Swallow: Unable to elicit Pharyngeal Secretions: Not observed secondary MBS    Reason for Referral Objectively evaluate swallowing function   Oral Phase Oral Preparation/Oral Phase Oral Phase: Impaired Oral - Solids Oral - Puree: Weak lingual manipulation;Lingual pumping;Reduced posterior propulsion Oral - Regular: Impaired mastication;Reduced posterior propulsion;Weak lingual manipulation   Pharyngeal Phase Pharyngeal Phase Pharyngeal Phase: Impaired Pharyngeal - Honey Pharyngeal - Honey Teaspoon: Premature spillage to valleculae;Delayed swallow initiation;Reduced tongue base retraction;Reduced airway/laryngeal closure;Pharyngeal residue - valleculae Pharyngeal - Nectar Pharyngeal - Nectar Cup: Delayed swallow initiation;Premature spillage to valleculae;Pharyngeal residue - valleculae;Reduced tongue base retraction;Reduced airway/laryngeal closure;Penetration/Aspiration during swallow Penetration/Aspiration details (nectar cup): Material enters airway, remains ABOVE vocal cords then ejected out Pharyngeal - Thin Pharyngeal - Thin Cup: Premature spillage to valleculae;Pharyngeal residue - valleculae;Penetration/Aspiration after swallow;Reduced airway/laryngeal closure Penetration/Aspiration details (thin cup): Material enters airway, CONTACTS cords and not ejected out Pharyngeal - Solids Pharyngeal - Puree: Delayed swallow initiation;Reduced  laryngeal elevation;Reduced tongue base retraction;Pharyngeal residue - valleculae Pharyngeal - Regular: Delayed swallow initiation;Premature spillage to valleculae;Pharyngeal residue - valleculae;Reduced tongue base retraction  Cervical Esophageal Phase    GO    Cervical Esophageal Phase Cervical Esophageal Phase: Leonarda Salon         Pablo Lawrence 01/21/2014, 4:29 PM  Angela Nevin, MA, CCC-SLP [facility] Speech-Language Pathologist

## 2014-01-22 DIAGNOSIS — I5032 Chronic diastolic (congestive) heart failure: Secondary | ICD-10-CM

## 2014-01-22 DIAGNOSIS — G92 Toxic encephalopathy: Secondary | ICD-10-CM

## 2014-01-22 DIAGNOSIS — J449 Chronic obstructive pulmonary disease, unspecified: Secondary | ICD-10-CM

## 2014-01-22 DIAGNOSIS — G929 Unspecified toxic encephalopathy: Secondary | ICD-10-CM

## 2014-01-22 LAB — PROTIME-INR
INR: 1.59 — ABNORMAL HIGH (ref 0.00–1.49)
PROTHROMBIN TIME: 18.5 s — AB (ref 11.6–15.2)

## 2014-01-22 LAB — BASIC METABOLIC PANEL
BUN: 28 mg/dL — ABNORMAL HIGH (ref 6–23)
CHLORIDE: 101 meq/L (ref 96–112)
CO2: 24 mEq/L (ref 19–32)
CREATININE: 0.92 mg/dL (ref 0.50–1.35)
Calcium: 9.5 mg/dL (ref 8.4–10.5)
GFR calc non Af Amer: 76 mL/min — ABNORMAL LOW (ref >90)
GFR, EST AFRICAN AMERICAN: 88 mL/min — AB (ref >90)
GLUCOSE: 94 mg/dL (ref 70–99)
Potassium: 4.7 mEq/L (ref 3.7–5.3)
Sodium: 139 mEq/L (ref 137–147)

## 2014-01-22 MED ORDER — MORPHINE SULFATE 2 MG/ML IJ SOLN: 0.5000 mg | INTRAMUSCULAR | Status: DC | PRN

## 2014-01-22 MED ORDER — WARFARIN SODIUM 5 MG PO TABS
5.0000 mg | ORAL_TABLET | Freq: Once | ORAL | Status: AC
  Administered 2014-01-22: 5 mg via ORAL
  Filled 2014-01-22: qty 1

## 2014-01-22 MED ORDER — MORPHINE SULFATE 2 MG/ML IJ SOLN
1.0000 mg | INTRAMUSCULAR | Status: DC | PRN
  Administered 2014-01-23 (×2): 1 mg via INTRAVENOUS
  Filled 2014-01-22 (×2): qty 1

## 2014-01-22 MED ORDER — FUROSEMIDE 40 MG PO TABS
40.0000 mg | ORAL_TABLET | Freq: Every day | ORAL | Status: DC
Start: 2014-01-22 — End: 2014-01-25
  Administered 2014-01-22 – 2014-01-25 (×4): 40 mg via ORAL
  Filled 2014-01-22 (×4): qty 1

## 2014-01-22 MED ORDER — BIOTENE DRY MOUTH MT LIQD
15.0000 mL | Freq: Two times a day (BID) | OROMUCOSAL | Status: DC
  Administered 2014-01-22 – 2014-01-25 (×7): 15 mL via OROMUCOSAL

## 2014-01-22 NOTE — Progress Notes (Signed)
The patient was SOB and complained of pain in his side at shift change at 1915 last night.  The [facility] midlevel was notified and Tylenol was ordered for the patient.  The patient was able to rest comfortably after receiving the Tylenol and his SOB subsided.  He slept most of the night.  His neuro checks were unchanged from previous neuro checks.

## 2014-01-22 NOTE — Progress Notes (Signed)
ANTICOAGULATION CONSULT NOTE - Follow Up Consult  Pharmacy Consult for Coumadin Indication: atrial fibrillation  Allergies  Allergen Reactions  . Fenofibrate Other (See Comments)    REACTION: "ran me up a wall"  . Penicillins Other (See Comments)    blisters  . Atorvastatin Other (See Comments)    REACTION: severe muscle aches  . Ezetimibe-Simvastatin Other (See Comments)    REACTION: severe muscle aches    Patient Measurements: Height: 6' (182.9 cm) Weight: 110 lb 9.6 oz (50.168 kg) (scale C) IBW/kg (Calculated) : 77.6  Vital Signs: Temp: 97.4 F (36.3 C) (01/31 0521) Temp src: Axillary (01/31 0521) BP: 111/59 mmHg (01/31 0511) Pulse Rate: 60 (01/31 0511)   Recent Labs  01/20/14 2159 01/20/14 2210 01/21/14 0410 01/22/14 0239  HGB 11.6* 12.2* 10.7*  --   HCT 33.3* 36.0* 30.9*  --   PLT 362  --  386  --   LABPROT 51.9*  --  24.7* 18.5*  INR 6.14*  --  2.32* 1.59*  CREATININE 0.88 0.90 0.80 0.92    Estimated Creatinine Clearance: 43.2 ml/min (by C-G formula based on Cr of 0.92).   Medications:  Scheduled:  . amiodarone  200 mg Oral BID  . antiseptic oral rinse  15 mL Mouth Rinse BID  . aspirin EC  81 mg Oral q morning - 10a  . budesonide (PULMICORT) nebulizer solution  0.25 mg Nebulization BID  . carvedilol  3.125 mg Oral BID WC  . ceFEPime (MAXIPIME) IV  1 g Intravenous Q24H  . diltiazem  300 mg Oral Daily  . feeding supplement (ENSURE)  1 Container Oral TID BM  . fluticasone  2 spray Each Nare Daily  . levothyroxine  25 mcg Oral QAC breakfast  . tamsulosin  0.4 mg Oral QPC breakfast  . vancomycin  1,000 mg Intravenous Q24H  . Warfarin - Pharmacist Dosing Inpatient   Does not apply q1800    Assessment: 80yo male on coumadin PTA for atrial fibrillation. (Home regimen: 5 mg daily except 2.5 mg on TTS) Patient was admitted 1/29 with a  supratherapeutic INR (6.14) in the context of recently added Amiodarone to medication regimen (which has a known  interaction causing elevation of the INR). No bleeding noted. Patient received vitamin K on 1/30 and INR decreased to 2.32.  Patient received 2.5 mg yesterday (lower than regular home dose). INR today is subtherapeutic at 1.59. Due to a subtherapeutic INR, dosing will be more aggressive today to bring INR back into therapeutic range.    CBC from 1/30 shows Hgb of 10.7 and pltc wnl.   Goal of Therapy:  INR 2-3 Monitor platelets by anticoagulation protocol: Yes   Plan:  -  Coumadin 5 mg PO x 1 dose tonight -  Daily INR -  F/u bleeding complications  Swanson Farnell C. Ly Wass, PharmD Clinical Pharmacist-Resident Pager: [phone] Pharmacy: [phone] 01/22/2014 9:31 AM

## 2014-01-22 NOTE — Discharge Instructions (Signed)
Information on my medicine - Coumadin®   (Warfarin) ° °This medication education was reviewed with me or my healthcare representative as part of my discharge preparation.  The pharmacist that spoke with me during my hospital stay was:  Ravenne Wayment C, RPH ° °Why was Coumadin prescribed for you? °Coumadin was prescribed for you because you have a blood clot or a medical condition that can cause an increased risk of forming blood clots. Blood clots can cause serious health problems by blocking the flow of blood to the heart, lung, or brain. Coumadin can prevent harmful blood clots from forming. °As a reminder your indication for Coumadin is:   Stroke Prevention Because Of Atrial Fibrillation ° °What test will check on my response to Coumadin? °While on Coumadin (warfarin) you will need to have an INR test regularly to ensure that your dose is keeping you in the desired range. The INR (international normalized ratio) number is calculated from the result of the laboratory test called prothrombin time (PT). ° °If an INR APPOINTMENT HAS NOT ALREADY BEEN MADE FOR YOU please schedule an appointment to have this lab work done by your health care provider within 7 days. °Your INR goal is usually a number between:  2 to 3 or your provider may give you a more narrow range like 2-2.5.  Ask your health care provider during an office visit what your goal INR is. ° °What  do you need to  know  About  COUMADIN? °Take Coumadin (warfarin) exactly as prescribed by your healthcare provider about the same time each day.  DO NOT stop taking without talking to the doctor who prescribed the medication.  Stopping without other blood clot prevention medication to take the place of Coumadin may increase your risk of developing a new clot or stroke.  Get refills before you run out. ° °What do you do if you miss a dose? °If you miss a dose, take it as soon as you remember on the same day then continue your regularly scheduled regimen the next  day.  Do not take two doses of Coumadin at the same time. ° °Important Safety Information °A possible side effect of Coumadin (Warfarin) is an increased risk of bleeding. You should call your healthcare provider right away if you experience any of the following: °  Bleeding from an injury or your nose that does not stop. °  Unusual colored urine (red or dark brown) or unusual colored stools (red or black). °  Unusual bruising for unknown reasons. °  A serious fall or if you hit your head (even if there is no bleeding). ° °Some foods or medicines interact with Coumadin® (warfarin) and might alter your response to warfarin. To help avoid this: °  Eat a balanced diet, maintaining a consistent amount of Vitamin K. °  Notify your provider about major diet changes you plan to make. °  Avoid alcohol or limit your intake to 1 drink for women and 2 drinks for men per day. °(1 drink is 5 oz. wine, 12 oz. beer, or 1.5 oz. liquor.) ° °Make sure that ANY health care provider who prescribes medication for you knows that you are taking Coumadin (warfarin).  Also make sure the healthcare provider who is monitoring your Coumadin knows when you have started a new medication including herbals and non-prescription products. ° °Coumadin® (Warfarin)  Major Drug Interactions  °Increased Warfarin Effect Decreased Warfarin Effect  °Alcohol (large quantities) °Antibiotics (esp. Septra/Bactrim, Flagyl, Cipro) °Amiodarone (Cordarone) °Aspirin (  ASA) °Cimetidine (Tagamet) °Megestrol (Megace) °NSAIDs (ibuprofen, naproxen, etc.) °Piroxicam (Feldene) °Propafenone (Rythmol SR) °Propranolol (Inderal) °Isoniazid (INH) °Posaconazole (Noxafil) Barbiturates (Phenobarbital) °Carbamazepine (Tegretol) °Chlordiazepoxide (Librium) °Cholestyramine (Questran) °Griseofulvin °Oral Contraceptives °Rifampin °Sucralfate (Carafate) °Vitamin K  ° °Coumadin® (Warfarin) Major Herbal Interactions  °Increased Warfarin Effect Decreased Warfarin Effect   °Garlic °Ginseng °Ginkgo biloba Coenzyme Q10 °Green tea °St. John’s wort   ° °Coumadin® (Warfarin) FOOD Interactions  °Eat a consistent number of servings per week of foods HIGH in Vitamin K °(1 serving = ½ cup)  °Collards (cooked, or boiled & drained) °Kale (cooked, or boiled & drained) °Mustard greens (cooked, or boiled & drained) °Parsley *serving size only = ¼ cup °Spinach (cooked, or boiled & drained) °Swiss chard (cooked, or boiled & drained) °Turnip greens (cooked, or boiled & drained)  °Eat a consistent number of servings per week of foods MEDIUM-HIGH in Vitamin K °(1 serving = 1 cup)  °Asparagus (cooked, or boiled & drained) °Broccoli (cooked, boiled & drained, or raw & chopped) °Brussel sprouts (cooked, or boiled & drained) *serving size only = ½ cup °Lettuce, raw (green leaf, endive, romaine) °Spinach, raw °Turnip greens, raw & chopped  ° °These websites have more information on Coumadin (warfarin):  www.coumadin.com; °www.ahrq.gov/consumer/coumadin.htm; ° ° °

## 2014-01-22 NOTE — Progress Notes (Signed)
[facility] PROGRESS NOTE  [name] UJW:[id number] DOB: [birth year] DOA: 01/20/2014 PCP: Tillman Abide, MD  Assessment/Plan: 1. Acute resp failure with hypoxia: appears to be secondary to PNA -continue IV antibiotics -continue pulmicort -PRN nebs and oxygen supplementation -will start flutter valve  2. Toxic encephalopathy: due to PNA and hypoxia most likely. Patient with underlying dementia. -will continue tx for PNA -continue supportive care -mentation improving (AAOX2, good insight of condition)  3. Dysphagia: most likely due to encephalopathy  -per speech evaluation and results of MBS will continue nectar thick liquids, dysphagia 1 and crushed meds. -slowly advance and adjust as needed.  4. Elevated INR: s/p Vit K -INR 1.59 today -continue coumadin per pharmacy  5.BPH: continue flomax  6. Hypothyroidism: continue synthroid  7. Atrial fibrillation: continue amiodarone, diltiazem, coreg  and coumadin per pharmacy.  8.HTN: stable and well controlled. 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 Continue monitoring  10-severe protein calorie malnutrition: will continue ensure pudding.  11-chronic pain: will start PRN morphine.  12-chronic diastolic CHF: essentially compensated.  -will resume PO lasix. -monitor intake/output and daily weight.  Code Status: Full Family Communication: nephew at bedside  Disposition Plan: to be determine    Consultants:  None   Procedures:  See below for x-ray reports   Antibiotics:  vanc and cefepime   HPI/Subjective: Reports he is feeling slightly better. Complaining of pain all over (had hx of chronic pain). No fever  Objective: Filed Vitals:   01/22/14 0521  BP:   Pulse:   Temp: 97.4 F (36.3 C)  Resp: 24    Intake/Output Summary (Last 24 hours) at 01/22/14 1004 Last data filed at 01/22/14 5621  Gross per 24 hour  Intake    360 ml  Output    400 ml  Net    -40 ml   Filed  Weights   01/21/14 0030 01/22/14 0521  Weight: 50.1 kg (110 lb 7.2 oz) 50.168 kg (110 lb 9.6 oz)    Exam:   General:  Afebrile, AAOX2, calm and cooperative with examination  Cardiovascular: s1 and s2, no rubs or gallops, mild JVD  Respiratory: coarse BS, diffuse rhonchi, positive exp wheezing and decreased BS at abses  Abdomen: soft, NT, ND, positive BS  Musculoskeletal: no edema, no cyanosis  Data Reviewed: Basic Metabolic Panel:  Recent Labs Lab 01/20/14 2159 01/20/14 2210 01/21/14 0410 01/22/14 0239  NA 138 136* 138 139  K 4.6 4.3 4.2 4.7  CL 98 100 100 101  CO2 24  --  24 24  GLUCOSE 110* 115* 109* 94  BUN 29* 31* 25* 28*  CREATININE 0.88 0.90 0.80 0.92  CALCIUM 9.7  --  9.5 9.5   Liver Function Tests:  Recent Labs Lab 01/21/14 0410  AST 29  ALT 27  ALKPHOS 106  BILITOT 0.6  PROT 6.8  ALBUMIN 2.3*   CBC:  Recent Labs Lab 01/20/14 2159 01/20/14 2210 01/21/14 0410  WBC 17.1*  --  15.3*  NEUTROABS 15.2*  --  13.6*  HGB 11.6* 12.2* 10.7*  HCT 33.3* 36.0* 30.9*  MCV 87.4  --  86.6  PLT 362  --  386   BNP (last 3 results)  Recent Labs  04/08/13 0818 01/08/14 2225 01/20/14 2159  PROBNP 1755.0* 1259.0* 1528.0*    Studies: Dg Chest 2 View  01/20/2014   CLINICAL DATA:  Shortness of breath  EXAM: CHEST  2 VIEW  COMPARISON:  01/08/2014  FINDINGS: Cardiac shadow is stable. New  right upper lobe infiltrate is seen when compare with the prior exam. Diffuse fibrotic changes are again identified throughout both lungs. Changes of prior granulomatous disease are noted as well.  IMPRESSION: New right upper lobe infiltrate superimposed over chronic changes bilaterally.   Electronically Signed   By: Alcide CleverMark  Lukens M.D.   On: 01/20/2014 21:19   Ct Head Wo Contrast  01/21/2014   CLINICAL DATA:  Altered level of consciousness  EXAM: CT HEAD WITHOUT CONTRAST  TECHNIQUE: Contiguous axial images were obtained from the base of the skull through the vertex without  intravenous contrast.  COMPARISON:  None.  FINDINGS: Prominence of the sulci, cisterns, and ventricles, in keeping with volume loss. Periventricular and subcortical white matter hypodensities are advanced and most in keeping with chronic microangiopathic change. No overt hydrocephalus. No definite CT evidence of an acute infarction. No intraparenchymal hemorrhage, mass, mass effect, or abnormal extra-axial fluid collection. The visualized paranasal sinuses and mastoid air cells are predominantly clear.  IMPRESSION: Volume loss and white matter changes as above. No definite CT evidence of acute intracranial abnormality.   Electronically Signed   By: Jearld LeschAndrew  DelGaizo M.D.   On: 01/21/2014 02:06   Dg Swallowing Func-speech Pathology  01/21/2014   [name] LawrenceJohn [name] Holland, CCC-SLP     01/21/2014  4:30 PM Objective Swallowing Evaluation: Modified Barium Swallowing Study   Patient Details  Name: [name] GowdaDanah Holland MRN: [id number] Date of Birth: 20-Feb-1928  Today's Date: 01/21/2014 Time: 1120-1135 SLP Time Calculation (min): 15 min  Past Medical History:  Past Medical History  Diagnosis Date  . CAD (coronary artery disease)   . Hyperlipidemia   . Kidney stones   . BPH (benign prostatic hypertrophy)   . Osteoarthritis   . COPD (chronic obstructive pulmonary disease)   . Hypothyroidism   . Atrial fibrillation   . Impaired fasting glucose   . Myocardial infarction   . Hypertension   . Shortness of breath   . CHF (congestive heart failure)   . GERD (gastroesophageal reflux disease)    Past Surgical History:  Past Surgical History  Procedure Laterality Date  . Inguinal hernia repair  09/2004    left  . Back surgery  1990's    x3  . Transurethral resection of prostate  2002  . Pleural scarification  1980    right  . Esophagogastroduodenoscopy  04/2002    negative  . Bladder stone removal  06/2006  . Colonoscopy Left 04/13/2013    Procedure: COLONOSCOPY;  Surgeon: Willis ModenaWilliam Outlaw, MD;   [facility];  Service: Endoscopy;   Laterality: Left;   HPI:  Patient is an 80 y.o. male with PMH: COPD, GERD, dyslipidemia,  atrial fibrillation, CAD, with four recent hospital admissions.  Patient had recently been admitted for atrial fibrillation and  diastolic dysfunction and dyspnea on exertion. Apparently, he has  not been eating, not taking medications appropriately, not  keeping his oxygen regularly, has a productive cough and  confusion.     Assessment / Plan / Recommendation Clinical Impression  Dysphagia Diagnosis: Moderate oral phase dysphagia;Severe  pharyngeal phase dysphagia Clinical impression: Patient presents with a moderate oral and  moderate-severe pharyngeal phase dysphagia. Oral phase  characterized by decreased lingual ROM and strength, decreased  mastication and lingual manipulation. Pharyngeal phase  characterized by swallow initiation delays to vallecular sinus,  decreased hyolaryngeal excursion, poor epiglottic closure to  protect airway and moderate vallecular residuals remaining post  initial swallow secondary to pharyngeal weakness and poor  laryngeal elevation. Patient  exhibited flash penetration with  nectar thick liquids and deep penetration which did not fully  clear with thin liquids.    Treatment Recommendation  Therapy as outlined in treatment plan below    Diet Recommendation Dysphagia 1 (Puree);Nectar-thick liquid   Liquid Administration via: Cup;No straw Medication Administration: Crushed with puree Supervision: Full supervision/cueing for compensatory strategies Compensations: Slow rate;Small sips/bites;Follow solids with  liquid;Multiple dry swallows after each bite/sip Postural Changes and/or Swallow Maneuvers: Seated upright 90  degrees;Upright 30-60 min after meal    Other  Recommendations Recommended Consults: MBS Oral Care Recommendations: Oral care BID Other Recommendations: Order thickener from pharmacy;Prohibited  food (jello, ice cream, thin soups);Remove water pitcher;Clarify  dietary restrictions    Follow Up Recommendations  24 hour supervision/assistance;Skilled Nursing facility    Frequency and Duration min 2x/week  2 weeks   Pertinent Vitals/Pain     SLP Swallow Goals  Patient will tolerate Dys 1 (puree), nectar thick liquids diet  with moderate cues to follow swallow precautions and no overt s/s  aspiration.   General Date of Onset: 01/20/14 HPI: Patient is an 80 y.o. male with PMH: COPD, GERD,  dyslipidemia, atrial fibrillation, CAD, with four recent hospital  admissions. Patient had recently been admitted for atrial  fibrillation and diastolic dysfunction and dyspnea on exertion.  Apparently, he has not been eating, not taking medications  appropriately, not keeping his oxygen regularly, has a productive  cough and confusion. Type of Study: Modified Barium Swallowing Study Reason for Referral: Objectively evaluate swallowing function Previous Swallow Assessment: BSE Diet Prior to this Study: NPO Temperature Spikes Noted: No Respiratory Status: Nasal cannula History of Recent Intubation: No Behavior/Cognition: Alert;Cooperative;Pleasant mood;Requires  cueing;Confused Oral Cavity - Dentition: Adequate natural dentition Self-Feeding Abilities: Needs assist Patient Positioning: Upright in chair Baseline Vocal Quality: Low vocal intensity;Wet Volitional Cough: Weak Volitional Swallow: Unable to elicit Pharyngeal Secretions: Not observed secondary MBS    Reason for Referral Objectively evaluate swallowing function   Oral Phase Oral Preparation/Oral Phase Oral Phase: Impaired Oral - Solids Oral - Puree: Weak lingual manipulation;Lingual pumping;Reduced  posterior propulsion Oral - Regular: Impaired mastication;Reduced posterior  propulsion;Weak lingual manipulation   Pharyngeal Phase Pharyngeal Phase Pharyngeal Phase: Impaired Pharyngeal - Honey Pharyngeal - Honey Teaspoon: Premature spillage to  valleculae;Delayed swallow initiation;Reduced tongue base  retraction;Reduced airway/laryngeal closure;Pharyngeal  residue -  valleculae Pharyngeal - Nectar Pharyngeal - Nectar Cup: Delayed swallow initiation;Premature  spillage to valleculae;Pharyngeal residue - valleculae;Reduced  tongue base retraction;Reduced airway/laryngeal  closure;Penetration/Aspiration during swallow Penetration/Aspiration details (nectar cup): Material enters  airway, remains ABOVE vocal cords then ejected out Pharyngeal - Thin Pharyngeal - Thin Cup: Premature spillage to  valleculae;Pharyngeal residue - valleculae;Penetration/Aspiration  after swallow;Reduced airway/laryngeal closure Penetration/Aspiration details (thin cup): Material enters  airway, CONTACTS cords and not ejected out Pharyngeal - Solids Pharyngeal - Puree: Delayed swallow initiation;Reduced laryngeal  elevation;Reduced tongue base retraction;Pharyngeal residue -  valleculae Pharyngeal - Regular: Delayed swallow initiation;Premature  spillage to valleculae;Pharyngeal residue - valleculae;Reduced  tongue base retraction  Cervical Esophageal Phase    GO    Cervical Esophageal Phase Cervical Esophageal Phase: Leonarda Salon         [name] 01/21/2014, 4:29 PM  Angela Nevin, MA, CCC-SLP [facility] Speech-Language Pathologist      Scheduled Meds: . amiodarone  200 mg Oral BID  . antiseptic oral rinse  15 mL Mouth Rinse BID  . aspirin EC  81 mg Oral q morning - 10a  . budesonide (PULMICORT) nebulizer solution  0.25  mg Nebulization BID  . carvedilol  3.125 mg Oral BID WC  . ceFEPime (MAXIPIME) IV  1 g Intravenous Q24H  . diltiazem  300 mg Oral Daily  . feeding supplement (ENSURE)  1 Container Oral TID BM  . fluticasone  2 spray Each Nare Daily  . furosemide  40 mg Oral Daily  . levothyroxine  25 mcg Oral QAC breakfast  . tamsulosin  0.4 mg Oral QPC breakfast  . vancomycin  1,000 mg Intravenous Q24H  . warfarin  5 mg Oral ONCE-1800  . Warfarin - Pharmacist Dosing Inpatient   Does not apply q1800    Time spent: >30 minutes   Sheli Dorin  [facility] Pager  [phone]. If 7PM-7AM, please contact night-coverage at [email or web address], password [facility] 01/22/2014, 10:04 AM  LOS: 2 days

## 2014-01-23 DIAGNOSIS — Z7901 Long term (current) use of anticoagulants: Secondary | ICD-10-CM

## 2014-01-23 LAB — BASIC METABOLIC PANEL
BUN: 26 mg/dL — ABNORMAL HIGH (ref 6–23)
CALCIUM: 9.1 mg/dL (ref 8.4–10.5)
CO2: 23 mEq/L (ref 19–32)
CREATININE: 0.77 mg/dL (ref 0.50–1.35)
Chloride: 104 mEq/L (ref 96–112)
GFR calc Af Amer: >90 mL/min (ref >90)
GFR, EST NON AFRICAN AMERICAN: 82 mL/min — AB (ref >90)
GLUCOSE: 126 mg/dL — AB (ref 70–99)
Potassium: 3.5 mEq/L — ABNORMAL LOW (ref 3.7–5.3)
Sodium: 141 mEq/L (ref 137–147)

## 2014-01-23 LAB — PROTIME-INR
INR: 2.24 — AB (ref 0.00–1.49)
Prothrombin Time: 24.1 seconds — ABNORMAL HIGH (ref 11.6–15.2)

## 2014-01-23 LAB — EXPECTORATED SPUTUM ASSESSMENT W REFEX TO RESP CULTURE

## 2014-01-23 MED ORDER — MORPHINE SULFATE 2 MG/ML IJ SOLN
1.0000 mg | INTRAMUSCULAR | Status: DC | PRN
  Administered 2014-01-24: 1 mg via INTRAVENOUS
  Filled 2014-01-23 (×2): qty 1

## 2014-01-23 MED ORDER — AMITRIPTYLINE HCL 25 MG PO TABS
25.0000 mg | ORAL_TABLET | Freq: Every day | ORAL | Status: DC
  Administered 2014-01-23 – 2014-01-24 (×2): 25 mg via ORAL
  Filled 2014-01-23 (×3): qty 1

## 2014-01-23 MED ORDER — HYDROCODONE-ACETAMINOPHEN 10-325 MG PO TABS
1.0000 | ORAL_TABLET | Freq: Three times a day (TID) | ORAL | Status: DC | PRN
  Administered 2014-01-23 – 2014-01-25 (×4): 1 via ORAL
  Filled 2014-01-23 (×5): qty 1

## 2014-01-23 MED ORDER — WARFARIN SODIUM 2.5 MG PO TABS
2.5000 mg | ORAL_TABLET | Freq: Once | ORAL | Status: AC
  Administered 2014-01-23: 2.5 mg via ORAL
  Filled 2014-01-23: qty 1

## 2014-01-23 NOTE — Progress Notes (Signed)
ANTICOAGULATION/ANTIBIOTIC CONSULT NOTE - Follow Up Consult  Pharmacy Consult for Coumadin / vancomycin / cefepime Indication: atrial fibrillation / pneumonia  Allergies  Allergen Reactions  . Fenofibrate Other (See Comments)    REACTION: "ran me up a wall"  . Penicillins Other (See Comments)    blisters  . Atorvastatin Other (See Comments)    REACTION: severe muscle aches  . Ezetimibe-Simvastatin Other (See Comments)    REACTION: severe muscle aches   Patient Measurements: Height: 6' (182.9 cm) Weight: 107 lb 6.4 oz (48.716 kg) IBW/kg (Calculated) : 77.6  Vital Signs: Temp: 97.6 F (36.4 C) (02/01 0700) Temp src: Oral (02/01 0700) BP: 122/54 mmHg (02/01 0700) Pulse Rate: 65 (02/01 0700)  Recent Labs  01/20/14 2159 01/20/14 2210 01/21/14 0410 01/22/14 0239 01/23/14 0505  HGB 11.6* 12.2* 10.7*  --   --   HCT 33.3* 36.0* 30.9*  --   --   PLT 362  --  386  --   --   LABPROT 51.9*  --  24.7* 18.5* 24.1*  INR 6.14*  --  2.32* 1.59* 2.24*  CREATININE 0.88 0.90 0.80 0.92 0.77   Estimated Creatinine Clearance: 48.2 ml/min (by C-G formula based on Cr of 0.77).  Microbiology: No results found for this or any previous visit (from the past 720 hour(s)).  Anti-infectives   Start     Dose/Rate Route Frequency Ordered Stop   01/22/14 1400  ceFEPIme (MAXIPIME) 1 g in dextrose 5 % 50 mL IVPB     1 g 100 mL/hr over 30 Minutes Intravenous Every 24 hours 01/21/14 1617     01/21/14 2300  vancomycin (VANCOCIN) IVPB 1000 mg/200 mL premix     1,000 mg 200 mL/hr over 60 Minutes Intravenous Every 24 hours 01/20/14 2238 01/28/14 2259   01/21/14 0600  ceFEPIme (MAXIPIME) 1 g in dextrose 5 % 50 mL IVPB  Status:  Discontinued     1 g 100 mL/hr over 30 Minutes Intravenous 3 times per day 01/20/14 2238 01/21/14 1617   01/21/14 0045  ceFEPIme (MAXIPIME) 1 g in dextrose 5 % 50 mL IVPB  Status:  Discontinued     1 g 100 mL/hr over 30 Minutes Intravenous 3 times per day 01/21/14 0032 01/21/14  0050   01/20/14 2300  ceFEPIme (MAXIPIME) 1 g in dextrose 5 % 50 mL IVPB     1 g 100 mL/hr over 30 Minutes Intravenous  Once 01/20/14 2210 01/21/14 0015   01/20/14 2215  vancomycin (VANCOCIN) IVPB 1000 mg/200 mL premix     1,000 mg 200 mL/hr over 60 Minutes Intravenous  Once 01/20/14 2209 01/20/14 2342     Medications:  Scheduled:  . amiodarone  200 mg Oral BID  . antiseptic oral rinse  15 mL Mouth Rinse BID  . aspirin EC  81 mg Oral q morning - 10a  . budesonide (PULMICORT) nebulizer solution  0.25 mg Nebulization BID  . carvedilol  3.125 mg Oral BID WC  . ceFEPime (MAXIPIME) IV  1 g Intravenous Q24H  . diltiazem  300 mg Oral Daily  . feeding supplement (ENSURE)  1 Container Oral TID BM  . fluticasone  2 spray Each Nare Daily  . furosemide  40 mg Oral Daily  . levothyroxine  25 mcg Oral QAC breakfast  . tamsulosin  0.4 mg Oral QPC breakfast  . vancomycin  1,000 mg Intravenous Q24H  . Warfarin - Pharmacist Dosing Inpatient   Does not apply q1800    Assessment: 80yo male  on coumadin PTA for atrial fibrillation. (Home regimen: 5 mg daily except 2.5 mg on TTS.) Patient was admitted 1/29 with a  supratherapeutic INR (6.14) in the context of recently added Amiodarone to medication regimen (which has a known interaction causing elevation of the INR). Patient received vitamin K on 1/30 and INR decreased to 2.32.  INR today is therapeutic at 2.24. Due to significant increase in INR (1.59 >> 2.24), dosing will be more conservative than his typical home regimen.     CBC from 1/30 shows Hgb of 10.7 and pltc wnl. No bleeding issues noted.  Patient also continues on IV vancomycin and cefepime for pneumonia. He is afebrile, and WBC from 1/30 is 15.3. No culture data. SCr today is 0.77, CrCl ~48 mL/min. Doses of antibiotics remain appropriate. May need dose adjustments if renal function continues to improve.   Goal of Therapy:  INR 2-3 Monitor platelets by anticoagulation protocol: Yes   Plan:   -  Coumadin 2.5 mg PO x 1 dose tonight -  Daily INR, f/u bleeding complications -  Continue vancomycin 1 g IV q 24 hours -  Continue cefepime 1 g IV q 24 hours -  Monitor renal function, clinical progression  Tyan Dy C. Vernor Monnig, PharmD Clinical Pharmacist-Resident Pager: [phone] Pharmacy: [phone] 01/23/2014 8:29 AM

## 2014-01-23 NOTE — Progress Notes (Signed)
[facility] PROGRESS NOTE  [name] GowdaDanah Holland GNF:[id number] DOB: 05-Nov-1928 DOA: 01/20/2014 PCP: Tillman Abideichard Letvak, MD  Assessment/Plan: 1. Acute resp failure with hypoxia: appears to be secondary to PNA and hypoxia. -continue IV antibiotics -continue pulmicort -PRN nebs and oxygen supplementation -will encourage flutter valve use -patient slowly improving.  2. Toxic encephalopathy: due to PNA and hypoxia most likely. Patient with underlying dementia. -will continue tx for PNA -continue supportive care and oxygen supplementation -mentation improving (AAOX2, good insight of condition)  3. Dysphagia: most likely due to encephalopathy  -per speech evaluation and results of MBS will continue nectar thick liquids, dysphagia 1 and crushed meds. -slowly advance and adjust as needed.  4. Elevated INR: s/p Vit K -continue coumadin per pharmacy  5.BPH: continue flomax  6. Hypothyroidism: continue synthroid  7. Atrial fibrillation: continue amiodarone, diltiazem, coreg  and coumadin per pharmacy. INR subtherapeutic  8.HTN: stable and well controlled. 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 Continue monitoring  10-severe protein calorie malnutrition: will continue ensure pudding.  11-chronic pain: will start PRN morphine.  12-chronic diastolic CHF: essentially compensated.  -will continue PO lasix. -monitor intake/output and daily weight.  Code Status: Full Family Communication: nephew at bedside  Disposition Plan: to be determine    Consultants:  None   Procedures:  See below for x-ray reports   Antibiotics:  vanc and cefepime   HPI/Subjective: Complaining of pain on his hips, back and legs; no fe ver. Patient also with sore throat  Objective: Filed Vitals:   01/23/14 1427  BP: 123/54  Pulse: 66  Temp: 97.7 F (36.5 C)  Resp: 18    Intake/Output Summary (Last 24 hours) at 01/23/14 1558 Last data filed at 01/23/14 1216  Gross per 24  hour  Intake    770 ml  Output    728 ml  Net     42 ml   Filed Weights   01/21/14 0030 01/22/14 0521 01/23/14 0700  Weight: 50.1 kg (110 lb 7.2 oz) 50.168 kg (110 lb 9.6 oz) 48.716 kg (107 lb 6.4 oz)    Exam:   General:  Afebrile, AAOX2, calm and cooperative with examination  Cardiovascular: s1 and s2, no rubs or gallops, mild JVD  Respiratory: coarse BS, diffuse rhonchi, positive exp wheezing and decreased BS at abses  Abdomen: soft, NT, ND, positive BS  Musculoskeletal: no edema, no cyanosis  Data Reviewed: Basic Metabolic Panel:  Recent Labs Lab 01/20/14 2159 01/20/14 2210 01/21/14 0410 01/22/14 0239 01/23/14 0505  NA 138 136* 138 139 141  K 4.6 4.3 4.2 4.7 3.5*  CL 98 100 100 101 104  CO2 24  --  24 24 23   GLUCOSE 110* 115* 109* 94 126*  BUN 29* 31* 25* 28* 26*  CREATININE 0.88 0.90 0.80 0.92 0.77  CALCIUM 9.7  --  9.5 9.5 9.1   Liver Function Tests:  Recent Labs Lab 01/21/14 0410  AST 29  ALT 27  ALKPHOS 106  BILITOT 0.6  PROT 6.8  ALBUMIN 2.3*   CBC:  Recent Labs Lab 01/20/14 2159 01/20/14 2210 01/21/14 0410  WBC 17.1*  --  15.3*  NEUTROABS 15.2*  --  13.6*  HGB 11.6* 12.2* 10.7*  HCT 33.3* 36.0* 30.9*  MCV 87.4  --  86.6  PLT 362  --  386   BNP (last 3 results)  Recent Labs  04/08/13 0818 01/08/14 2225 01/20/14 2159  PROBNP 1755.0* 1259.0* 1528.0*    Studies: No results found.  Scheduled  Meds: . amiodarone  200 mg Oral BID  . amitriptyline  25 mg Oral QHS  . antiseptic oral rinse  15 mL Mouth Rinse BID  . aspirin EC  81 mg Oral q morning - 10a  . budesonide (PULMICORT) nebulizer solution  0.25 mg Nebulization BID  . carvedilol  3.125 mg Oral BID WC  . ceFEPime (MAXIPIME) IV  1 g Intravenous Q24H  . diltiazem  300 mg Oral Daily  . feeding supplement (ENSURE)  1 Container Oral TID BM  . fluticasone  2 spray Each Nare Daily  . furosemide  40 mg Oral Daily  . levothyroxine  25 mcg Oral QAC breakfast  . tamsulosin  0.4  mg Oral QPC breakfast  . vancomycin  1,000 mg Intravenous Q24H  . warfarin  2.5 mg Oral ONCE-1800  . Warfarin - Pharmacist Dosing Inpatient   Does not apply q1800    Time spent: >30 minutes   Garrell Flagg  [facility] Pager [phone]. If 7PM-7AM, please contact night-coverage at [email or web address], password [facility] 01/23/2014, 3:58 PM  LOS: 3 days

## 2014-01-24 DIAGNOSIS — I2510 Atherosclerotic heart disease of native coronary artery without angina pectoris: Secondary | ICD-10-CM

## 2014-01-24 LAB — PROTIME-INR
INR: 2.99 — ABNORMAL HIGH (ref 0.00–1.49)
PROTHROMBIN TIME: 30 s — AB (ref 11.6–15.2)

## 2014-01-24 MED ORDER — DILTIAZEM 12 MG/ML ORAL SUSPENSION: 60.0000 mg | Freq: Four times a day (QID) | ORAL | Status: DC

## 2014-01-24 MED ORDER — DILTIAZEM 12 MG/ML ORAL SUSPENSION
60.0000 mg | Freq: Four times a day (QID) | ORAL | Status: DC
  Administered 2014-01-25 (×2): 60 mg via ORAL
  Filled 2014-01-24 (×6): qty 6

## 2014-01-24 MED ORDER — POTASSIUM CHLORIDE 20 MEQ/15ML (10%) PO LIQD
40.0000 meq | Freq: Once | ORAL | Status: AC
  Administered 2014-01-24: 40 meq via ORAL
  Filled 2014-01-24: qty 30

## 2014-01-24 NOTE — Progress Notes (Signed)
Patient alert and oriented x2, disoriented to situation and time.  PRN cough medicine and pain medicine given for pain in shoulders and chest related to coughing.  Patient tolerating dysphagia 2 diet well.  Condom catheter changed this afternoon as it was leaking.  IV leaking and site changed to R anterior forearm.  Patient states no additional questions or concerns at this time.  Will continue to monitor.

## 2014-01-24 NOTE — Progress Notes (Signed)
[facility] PROGRESS NOTE  [name] GowdaDanah Holland WGN:[id number] DOB: 14-Jan-1928 DOA: 01/20/2014 PCP: [name] Abideichard Letvak, MD  Assessment/Plan: 1. Acute resp failure with hypoxia: appears to be secondary to PNA and hypoxia. -continue IV antibiotics -continue pulmicort -PRN nebs and oxygen supplementation -will encourage flutter valve use -patient continue slowly improving.  2. Toxic encephalopathy: due to PNA and hypoxia most likely. Patient with underlying dementia. -will continue tx for PNA -continue supportive care and oxygen supplementation -mentation continue improving and pretty much back to baseline (AAOX2, good insight of condition; lost in time only)  3. Dysphagia: most likely due to encephalopathy and deconditioning  -improving -slowly advance and adjust as needed. (today 2/2, will start dysphagia type 2 with nectar thick liquids) -patient's medications to be crushed with apple sauce)  4. Elevated INR: s/p Vit K -continue coumadin per pharmacy -slowly increasing   5.BPH: continue flomax  6. Hypothyroidism: continue synthroid  7. Atrial fibrillation: continue amiodarone, diltiazem, coreg  and coumadin per pharmacy.   8.HTN: stable and well controlled. 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 Continue monitoring  10-severe protein calorie malnutrition: will continue ensure pudding.  11-chronic pain: will resume home pain meds and try to use oral regimen as much as possible.  12-chronic diastolic CHF: essentially compensated.  -will continue PO lasix. -monitor intake/output and daily weight.  13-physical deconditioning: will need SNF at discharge for rehab. Continue PT while inpatient.  Code Status: Full Family Communication: nephew at bedside  Disposition Plan: needs SNF at discharge for rehabilitation    Consultants:  None   Procedures:  See below for x-ray reports   Antibiotics:  vanc and cefepime   HPI/Subjective: Feeling a  breathing a lot better. Pain and cough also improved. Denies CP, palpitations or fever.  Objective: Filed Vitals:   01/24/14 1347  BP: 117/49  Pulse: 70  Temp: 97.3 F (36.3 C)  Resp: 18    Intake/Output Summary (Last 24 hours) at 01/24/14 1350 Last data filed at 01/24/14 1348  Gross per 24 hour  Intake    410 ml  Output    350 ml  Net     60 ml   Filed Weights   01/22/14 0521 01/23/14 0700 01/24/14 0452  Weight: 50.168 kg (110 lb 9.6 oz) 48.716 kg (107 lb 6.4 oz) 42.9 kg (94 lb 9.2 oz)    Exam:   General:  Afebrile, AAOX2, calm and cooperative with examination  Cardiovascular: s1 and s2, no rubs or gallops, mild JVD  Respiratory: improve air movement, scattered rhonchi, no exp wheezing and decreased BS at abses  Abdomen: soft, NT, ND, positive BS  Musculoskeletal: no edema, no cyanosis  Data Reviewed: Basic Metabolic Panel:  Recent Labs Lab 01/20/14 2159 01/20/14 2210 01/21/14 0410 01/22/14 0239 01/23/14 0505  NA 138 136* 138 139 141  K 4.6 4.3 4.2 4.7 3.5*  CL 98 100 100 101 104  CO2 24  --  24 24 23   GLUCOSE 110* 115* 109* 94 126*  BUN 29* 31* 25* 28* 26*  CREATININE 0.88 0.90 0.80 0.92 0.77  CALCIUM 9.7  --  9.5 9.5 9.1   Liver Function Tests:  Recent Labs Lab 01/21/14 0410  AST 29  ALT 27  ALKPHOS 106  BILITOT 0.6  PROT 6.8  ALBUMIN 2.3*   CBC:  Recent Labs Lab 01/20/14 2159 01/20/14 2210 01/21/14 0410  WBC 17.1*  --  15.3*  NEUTROABS 15.2*  --  13.6*  HGB 11.6* 12.2* 10.7*  HCT  33.3* 36.0* 30.9*  MCV 87.4  --  86.6  PLT 362  --  386   BNP (last 3 results)  Recent Labs  04/08/13 0818 01/08/14 2225 01/20/14 2159  PROBNP 1755.0* 1259.0* 1528.0*    Studies: No results found.  Scheduled Meds: . amiodarone  200 mg Oral BID  . amitriptyline  25 mg Oral QHS  . antiseptic oral rinse  15 mL Mouth Rinse BID  . aspirin EC  81 mg Oral q morning - 10a  . budesonide (PULMICORT) nebulizer solution  0.25 mg Nebulization BID  .  carvedilol  3.125 mg Oral BID WC  . ceFEPime (MAXIPIME) IV  1 g Intravenous Q24H  . [START ON 01/25/2014] diltiazem  60 mg Oral Q6H  . feeding supplement (ENSURE)  1 Container Oral TID BM  . fluticasone  2 spray Each Nare Daily  . furosemide  40 mg Oral Daily  . levothyroxine  25 mcg Oral QAC breakfast  . tamsulosin  0.4 mg Oral QPC breakfast  . vancomycin  1,000 mg Intravenous Q24H  . Warfarin - Pharmacist Dosing Inpatient   Does not apply q1800    Time spent: >30 minutes   [name]  [facility] Pager [phone]. If 7PM-7AM, please contact night-coverage at [email or web address], password [facility] 01/24/2014, 1:50 PM  LOS: 4 days

## 2014-01-24 NOTE — Evaluation (Signed)
Occupational Therapy Evaluation Patient Details Name: [name] MRN: [id number] DOB: 1928-07-30 Today's Date: 01/24/2014 Time: 1121-6244 OT Time Calculation (min): 41 min  OT Assessment / Plan / Recommendation History of present illness [name] is a 80 y.o. male with Past medical history of coronary artery disease, atrial fibrillation 4 recent admissions, dyslipidemia, COPD, GERD.  Admitted with AMS   Clinical Impression   Pt. Was cooperative with therapy and was receptive to OT instructions. Pt. Is weak and will need ST SNF for rehab. Pt. Is an agreement to this. Futher OT needs can be met at [facility]. No further acute OT is needed.    OT Assessment  All further OT needs can be met in the next venue of care    Follow Up Recommendations  SNF    Barriers to Discharge      Equipment Recommendations       Recommendations for Other Services    Frequency       Precautions / Restrictions Precautions Precautions: Fall Restrictions Weight Bearing Restrictions: No   Pertinent Vitals/Pain 7/10 Pt. Nurse notified.    ADL  Eating/Feeding: Performed;Independent Where Assessed - Eating/Feeding: Chair Grooming: Performed;Wash/dry hands;Wash/dry face;Set up Where Assessed - Grooming: Unsupported sitting Upper Body Bathing: Simulated;Set up Where Assessed - Upper Body Bathing: Unsupported sitting Lower Body Bathing: Minimal assistance;Simulated Where Assessed - Lower Body Bathing: Unsupported sitting;Supported standing Upper Body Dressing: Performed;Minimal assistance Where Assessed - Upper Body Dressing: Unsupported sitting Lower Body Dressing: Performed;Minimal assistance Where Assessed - Lower Body Dressing: Unsupported sitting;Supported standing Toilet Transfer: Simulated;Minimal assistance Toilet Transfer Method: Stand pivot ADL Comments:  (Pt. able to follow directions for ADLs and requries Min A.)    OT Diagnosis:    OT Problem List:   OT Treatment Interventions:      OT Goals(Current goals can be found in the care plan section) Acute Rehab OT Goals Patient Stated Goal: none stated  Visit Information  Last OT Received On: 01/24/14 Assistance Needed: +1 History of Present Illness: [name] is a 80 y.o. male with Past medical history of coronary artery disease, atrial fibrillation 4 recent admissions, dyslipidemia, COPD, GERD.  Admitted with AMS       Prior [place] expects to be discharged to:: Private residence Living Arrangements: Spouse/significant other Available Help at Discharge: Family;Available 24 hours/day Type of Home: House Home Access: Stairs to enter Home Equipment: Environmental consultant - 2 wheels Additional Comments: pt unable to provide home information, obtained information from chart Prior Function Level of Independence: Independent Comments: pt states he uses a RW Communication Communication: No difficulties Dominant Hand: Right         Vision/Perception Vision - History Baseline Vision: Wears glasses all the time Visual History: Cataracts Patient Visual Report: No change from baseline   Cognition  Cognition Arousal/Alertness: Awake/alert Behavior During Therapy: Impulsive Overall Cognitive Status: No family/caregiver present to determine baseline cognitive functioning Memory: Decreased short-term memory    Extremity/Trunk Assessment Upper Extremity Assessment Upper Extremity Assessment: Generalized weakness     Mobility Bed Mobility Overal bed mobility: Needs Assistance Supine to sit: Supervision Transfers Sit to Stand: Mod assist     Exercise     Balance     End of Session OT - End of Session Activity Tolerance: Patient limited by fatigue Patient left: in chair  [place] 01/24/2014, 8:27 AM

## 2014-01-24 NOTE — Progress Notes (Signed)
Physical Therapy Treatment Patient Details Name: [name] MRN: [id number] DOB: [birth year] Today's Date: 01/24/2014 Time: 1200-1230 PT Time Calculation (min): 30 min  PT Assessment / Plan / Recommendation  History of Present Illness [name] is a 80 y.o. male with Past medical history of coronary artery disease, atrial fibrillation 4 recent admissions, dyslipidemia, COPD, GERD.  Admitted with AMS   PT Comments   Pt admitted with above. Pt currently with functional limitations due to balance ane endurance deficits.  Spoke with pt, pts wife and daughter re: home plans.  Pt and wife both with memory issues.  Pt and wife not agreeable to go to A living but this is PT recommendation.  Daughter to talk with SW about sitter options.  Called Lupita LeashDonna to come and talk with daughter but had to leave a message with her.  Pt will benefit from skilled PT to increase their independence and safety with mobility to allow discharge to the venue listed below.   Follow Up Recommendations  SNF;Supervision/Assistance - 24 hour                 Equipment Recommendations  None recommended by PT        Frequency Min 3X/week   Progress towards PT Goals Progress towards PT goals: Progressing toward goals  Plan Current plan remains appropriate    Precautions / Restrictions Precautions Precautions: Fall Precaution Comments: has sitter Restrictions Weight Bearing Restrictions: No   Pertinent Vitals/Pain Desat on RA with ambulation to 85%; replaced O2 at 2L with >90%, no pain    Mobility  Bed Mobility Overal bed mobility: Needs Assistance Bed Mobility: Supine to Sit Supine to sit: Supervision General bed mobility comments: uses rails Transfers Overall transfer level: Needs assistance Equipment used: Rolling walker (2 wheeled) Transfers: Sit to/from Stand Sit to Stand: Mod assist General transfer comment: cues for safe hand placement, lifting assist.  Ambulation/Gait Ambulation/Gait assistance:  Mod assist Ambulation Distance (Feet): 125 Feet Assistive device: Rolling walker (2 wheeled) Gait Pattern/deviations: Step-to pattern;Decreased stride length;Shuffle;Narrow base of support;Trunk flexed;Staggering right;Staggering left Gait velocity interpretation: <1.8 ft/sec, indicative of risk for recurrent falls General Gait Details: Pt ambulated with RW with cues to stay close to RW as well as occasional assist to sequence steps and rW.  Pt confused throughout treatment.  Pt desat with ambulation as well.      PT Goals (current goals can now be found in the care plan section)    Visit Information  Last PT Received On: 01/24/14 Assistance Needed: +1 History of Present Illness: [name] is a 80 y.o. male with Past medical history of coronary artery disease, atrial fibrillation 4 recent admissions, dyslipidemia, COPD, GERD.  Admitted with AMS    Subjective Data  Subjective: "I am weaker today."   Cognition  Cognition Arousal/Alertness: Awake/alert Behavior During Therapy: Impulsive Overall Cognitive Status: Impaired/Different from baseline Area of Impairment: Memory;Attention;Orientation;Following commands;Safety/judgement;Awareness;Problem solving Orientation Level: Time;Situation;Place Current Attention Level: Focused Memory: Decreased short-term memory Following Commands: Follows one step commands inconsistently Safety/Judgement: Decreased awareness of deficits;Decreased awareness of safety Awareness: Intellectual Problem Solving: Difficulty sequencing;Requires verbal cues;Requires tactile cues;Slow processing General Comments: Daughter and wife came in midway through treatment and daughter states pt had begun to have trouble taking meds correctly.  She noted a decline in memory.  She states once he got sick that his MS worsened.  She had checked into someone coming into home to help pt and wife 2 hours in the am and 2 hours in the evening  but states it is very expensive.       Balance  Balance Overall balance assessment: Needs assistance Sitting-balance support: No upper extremity supported;Feet supported Sitting balance-Leahy Scale: Fair Sitting balance - Comments: Sits EOB with min guard assist Standing balance support: Bilateral upper extremity supported;During functional activity Standing balance-Leahy Scale: Poor Standing balance comment: Pt with poor balance reactions in standing needing min guard to min asssist.  Cannot accept challenges at all in standing.   End of Session PT - End of Session Equipment Utilized During Treatment: Gait belt;Oxygen Activity Tolerance: Patient limited by fatigue Patient left: in chair;with family/visitor present;with call bell/phone within reach;with chair alarm set Nurse Communication: Mobility status       INGOLD,Hilman Kissling 01/24/2014, 1:03 PM [facility] [phone] (pager)

## 2014-01-24 NOTE — Progress Notes (Signed)
Speech Language Pathology Treatment: Dysphagia  Patient Details Name: [name] GowdaDanah Holland MRN: [id number] DOB: Apr 26, 1928 Today's Date: 01/24/2014 Time: [id number] SLP Time Calculation (min): 21 min  Assessment / Plan / Recommendation Clinical Impression  Pt reports he is feeling better today than in days prior but still feels somewhat confused and has a cough. He was noted to have a cough at baseline, but also consistent cough following sips of nectar. Pt followed cues for small sips well. Hopeful that pt sensation of penetrate is improving. Pt did demonstrate improved mastication and manipulation of solids, warrants upgrade to dys 2 diet, but needs continuation of nectar thick liquids until strength more significantly improves. Will continue to follow.    HPI HPI: Patient is an 80 y.o. male with PMH: COPD, GERD, dyslipidemia, atrial fibrillation, CAD, with four recent hospital admissions. Patient had recently been admitted for atrial fibrillation and diastolic dysfunction and dyspnea on exertion. Apparently, he has not been eating, not taking medications appropriately, not keeping his oxygen regularly, has a productive cough and confusion.   Pertinent Vitals NA  SLP Plan  Continue with current plan of care    Recommendations Diet recommendations: Dysphagia 2 (fine chop);Nectar-thick liquid Liquids provided via: Cup Medication Administration: Crushed with puree Supervision: Full supervision/cueing for compensatory strategies Compensations: Slow rate;Small sips/bites;Follow solids with liquid;Multiple dry swallows after each bite/sip Postural Changes and/or Swallow Maneuvers: Seated upright 90 degrees;Upright 30-60 min after meal              Oral Care Recommendations: Oral care BID Follow up Recommendations: 24 hour supervision/assistance;Skilled Nursing facility Plan: Continue with current plan of care    GO    [facility] EndoscopyBonnie Maydelin Deming, MA CCC-SLP [phone]  Claudine MoutonDeBlois, Kasper Mudrick Caroline 01/24/2014,  10:36 AM

## 2014-01-24 NOTE — Progress Notes (Signed)
UR completed Jakaila Norment K. Cinderella Christoffersen, RN, BSN, MSHL, CCM  01/24/2014 6:41 PM

## 2014-01-24 NOTE — Care Management Note (Addendum)
  Page 1 of 1   01/24/2014     6:40:59 PM   CARE MANAGEMENT NOTE 01/24/2014  [facility]  [facility] Hospital[facility]OBERTS[name]   Account Number:  [id number]  Date Initiated:  01/24/2014  Documentation initiated by:  Rodney Wigger  Subjective/Objective Assessment:   Admitted with AMS, HCAP     Action/Plan:   CM will follow for dispositon needs   Anticipated DC Date:  01/27/2014   Anticipated DC Plan:  SKILLED NURSING FACILITY  In-house referral  Clinical Social Worker         Choice offered to / List presented to:             Status of service:  In process, will continue to follow Medicare Important Message given?   (If response is "NO", the following Medicare IM given date fields will be blank) Date Medicare IM given:   Date Additional Medicare IM given:    Discharge Disposition:    Per UR Regulation:  Reviewed for med. necessity/level of care/duration of stay  If discussed at Long Length of Stay Meetings, dates discussed:    Comments:  01/27/2014 Hx/o family request SNF d/t too much to handle at home. ceFEPIme (MAXIPIME) 1 g in dextrose 5 % 50 mL IVPB PT RECS:  SNF;Supervision/Assistance - 24 hour DME RECS: None Dispositon Plan:  SNF - SW notified. [id number]Crystal Dwain Huhn RN, BSN, [place], CCM ([place]) [phone] 01/24/2014

## 2014-01-24 NOTE — Progress Notes (Signed)
Pharmacy Note-Anticoagulation  Pharmacy Consult :  80 y.o. male is currently on Coumadin for atrial fibrillation .   Latest Labs : Hematology :  Recent Labs  01/22/14 0239 01/23/14 0505 01/24/14 0516  LABPROT 18.5* 24.1* 30.0*  INR 1.59* 2.24* 2.99*  CREATININE 0.92 0.77  --     Lab Results  Component Value Date   INR 2.99* 01/24/2014   INR 2.24* 01/23/2014   INR 1.59* 01/22/2014        HGB 10.7* 01/21/2014   HGB 12.2* 01/20/2014   HGB 11.6* 01/20/2014     Current Medication[s] Include: Scheduled:  Scheduled:  . amiodarone  200 mg Oral BID  . amitriptyline  25 mg Oral QHS  . antiseptic oral rinse  15 mL Mouth Rinse BID  . aspirin EC  81 mg Oral q morning - 10a  . budesonide (PULMICORT) nebulizer solution  0.25 mg Nebulization BID  . carvedilol  3.125 mg Oral BID WC  . ceFEPime (MAXIPIME) IV  1 g Intravenous Q24H  . diltiazem  300 mg Oral Daily  . feeding supplement (ENSURE)  1 Container Oral TID BM  . fluticasone  2 spray Each Nare Daily  . furosemide  40 mg Oral Daily  . levothyroxine  25 mcg Oral QAC breakfast  . tamsulosin  0.4 mg Oral QPC breakfast  . vancomycin  1,000 mg Intravenous Q24H  . Warfarin - Pharmacist Dosing Inpatient   Does not apply q1800   Antibiotic[s]: Anti-infectives   Start     Dose/Rate Route Frequency Ordered Stop   01/22/14 1400  ceFEPIme (MAXIPIME) 1 g in dextrose 5 % 50 mL IVPB     1 g 100 mL/hr over 30 Minutes Intravenous Every 24 hours 01/21/14 1617     01/21/14 2300  vancomycin (VANCOCIN) IVPB 1000 mg/200 mL premix     1,000 mg 200 mL/hr over 60 Minutes Intravenous Every 24 hours 01/20/14 2238 01/28/14 2259   01/21/14 0600  ceFEPIme (MAXIPIME) 1 g in dextrose 5 % 50 mL IVPB  Status:  Discontinued     1 g 100 mL/hr over 30 Minutes Intravenous 3 times per day 01/20/14 2238 01/21/14 1617   01/21/14 0045  ceFEPIme (MAXIPIME) 1 g in dextrose 5 % 50 mL IVPB  Status:  Discontinued     1 g 100 mL/hr over 30 Minutes Intravenous 3 times per  day 01/21/14 0032 01/21/14 0050   01/20/14 2300  ceFEPIme (MAXIPIME) 1 g in dextrose 5 % 50 mL IVPB     1 g 100 mL/hr over 30 Minutes Intravenous  Once 01/20/14 2210 01/21/14 0015   01/20/14 2215  vancomycin (VANCOCIN) IVPB 1000 mg/200 mL premix     1,000 mg 200 mL/hr over 60 Minutes Intravenous  Once 01/20/14 2209 01/20/14 2342      Assessment :  Today's INR with large increase overnight and is at upper range of therapeutic goals.   INR is 2.99.  Patient started on Amiodarone 01/21/14.   No bleeding complications observed.  Goal :  INR goal is 2-3    Plan : 1. No Coumadin today. 2. Daily INR's, CBC. Monitor for bleeding complications  Laurena BeringStramoski, Acel Natzke J, Pharm.D. 01/24/2014  10:45 AM

## 2014-01-25 DIAGNOSIS — I10 Essential (primary) hypertension: Secondary | ICD-10-CM

## 2014-01-25 LAB — CULTURE, RESPIRATORY W GRAM STAIN: Culture: NORMAL

## 2014-01-25 LAB — BASIC METABOLIC PANEL
BUN: 23 mg/dL (ref 6–23)
CO2: 24 mEq/L (ref 19–32)
Calcium: 8.9 mg/dL (ref 8.4–10.5)
Chloride: 100 mEq/L (ref 96–112)
Creatinine, Ser: 0.89 mg/dL (ref 0.50–1.35)
GFR, EST AFRICAN AMERICAN: 89 mL/min — AB (ref >90)
GFR, EST NON AFRICAN AMERICAN: 77 mL/min — AB (ref >90)
Glucose, Bld: 83 mg/dL (ref 70–99)
POTASSIUM: 3.9 meq/L (ref 3.7–5.3)
SODIUM: 139 meq/L (ref 137–147)

## 2014-01-25 LAB — CBC
HCT: 33.4 % — ABNORMAL LOW (ref 39.0–52.0)
Hemoglobin: 11.1 g/dL — ABNORMAL LOW (ref 13.0–17.0)
MCH: 29.8 pg (ref 26.0–34.0)
MCHC: 33.2 g/dL (ref 30.0–36.0)
MCV: 89.8 fL (ref 78.0–100.0)
PLATELETS: 465 10*3/uL — AB (ref 150–400)
RBC: 3.72 MIL/uL — ABNORMAL LOW (ref 4.22–5.81)
RDW: 14.8 % (ref 11.5–15.5)
WBC: 13.4 10*3/uL — ABNORMAL HIGH (ref 4.0–10.5)

## 2014-01-25 LAB — PROTIME-INR
INR: 3.52 — AB (ref 0.00–1.49)
PROTHROMBIN TIME: 34 s — AB (ref 11.6–15.2)

## 2014-01-25 LAB — MAGNESIUM: Magnesium: 2.3 mg/dL (ref 1.5–2.5)

## 2014-01-25 LAB — CULTURE, RESPIRATORY

## 2014-01-25 MED ORDER — OXYMETAZOLINE HCL 0.05 % NA SOLN: 2.0000 | Freq: Two times a day (BID) | NASAL | Status: DC

## 2014-01-25 MED ORDER — WARFARIN SODIUM 5 MG PO TABS
2.5000 mg | ORAL_TABLET | Freq: Every day | ORAL | Status: DC
Start: 2014-01-27 — End: 2014-02-02

## 2014-01-25 MED ORDER — FUROSEMIDE 40 MG PO TABS: 40.0000 mg | ORAL_TABLET | Freq: Every day | ORAL | Status: DC

## 2014-01-25 MED ORDER — STARCH (THICKENING) PO POWD: ORAL | Status: DC

## 2014-01-25 MED ORDER — ASPIRIN 81 MG PO CHEW
81.0000 mg | CHEWABLE_TABLET | Freq: Every day | ORAL | Status: DC
  Administered 2014-01-25: 81 mg via ORAL
  Filled 2014-01-25: qty 1

## 2014-01-25 MED ORDER — LEVOFLOXACIN 500 MG PO TABS: 500.0000 mg | ORAL_TABLET | Freq: Every day | ORAL | Status: DC

## 2014-01-25 MED ORDER — ASPIRIN 81 MG PO CHEW: 81.0000 mg | CHEWABLE_TABLET | Freq: Every day | ORAL | Status: AC

## 2014-01-25 MED ORDER — HYDROCODONE-ACETAMINOPHEN 10-325 MG PO TABS: 1.0000 | ORAL_TABLET | Freq: Three times a day (TID) | ORAL | Status: DC | PRN

## 2014-01-25 MED ORDER — BUDESONIDE 0.25 MG/2ML IN SUSP: 0.2500 mg | Freq: Two times a day (BID) | RESPIRATORY_TRACT | Status: DC

## 2014-01-25 MED ORDER — MOXIFLOXACIN HCL 400 MG PO TABS: 400.0000 mg | ORAL_TABLET | Freq: Every day | ORAL | Status: AC

## 2014-01-25 MED ORDER — ENSURE PUDDING PO PUDG: 1.0000 | Freq: Three times a day (TID) | ORAL | Status: DC

## 2014-01-25 MED ORDER — ACETAMINOPHEN 325 MG PO TABS: 650.0000 mg | ORAL_TABLET | Freq: Four times a day (QID) | ORAL | Status: AC | PRN

## 2014-01-25 NOTE — Discharge Summary (Signed)
Physician Discharge Summary  [name] ZOX:[id number] DOB: 02-12-1928 DOA: 01/20/2014  PCP: Tillman Abide, MD  Admit date: 01/20/2014 Discharge date: 01/25/2014  Time spent: >30 minutes  Recommendations for Outpatient Follow-up:  Take medications as prescribed Aspiration precautions and follow up with Speech therapy at the facility Dysphagia 2 diet with nectar thick liquids BMET in 5 days (to follow electrolytes and renal function) CBC in 5 days to follow WBC's trend CXR in 4 weeks to follow resolution of infiltrates Oxygen supplementation 2-3L 24/7 (wean slowly if possible to RA) Physical rehabilitation as per facility protocol  Follow a low sodium heart healthy diet Ensure good hydration Close follow up to INR and coumadin adjustment (next coumadin dose on 2/5)  Discharge Diagnoses:  Principal Problem:   HCAP (healthcare-associated pneumonia) Active Problems:   CAD (coronary artery disease)   Atrial fibrillation   COPD   Hypothyroidism   Chronic diastolic heart failure   Acute delirium   Discharge Condition: stable and improved. Will discharge to [facility] for further care and physical rehabilitation  Diet recommendation: heart healthy diet; dysphagia 2 with nectar thick liquids  Filed Weights   01/23/14 0700 01/24/14 0452 01/25/14 0604  Weight: 48.716 kg (107 lb 6.4 oz) 42.9 kg (94 lb 9.2 oz) 42.4 kg (93 lb 7.6 oz)    History of present illness:  80 y.o. male with Past medical history of coronary artery disease, atrial fibrillation 4 recent admissions, dyslipidemia, COPD, GERD.  The patient was brought in by his family. The history has been obtained from ED physician as well as documentation since the patient was felt to be poor historian.  At the time of my evaluation the patient complained of some right-sided pain other than that he did not have any complain.  The patient was recently admitted for atrial fibrillation and diastolic dysfunction he was discharged he was  having significant dyspnea on exertion. The PCP visited him yesterday and recommended home oxygen. As per the documentation the patient has been having confusion and not acting himself since last few days. Patient is also not eating not taking his medication appropriately he is not keeping his oxygen on a regular basis. He also had productive cough.    Hospital Course:  1. Acute resp failure with hypoxia: appears to be secondary to PNA and hypoxia.  -continue avelox for 5 days to finish antibiotic therapy -continue pulmicort  -PRN inhalers and oxygen supplementation  -will encourage flutter valve use   2. Toxic encephalopathy: due to PNA and hypoxia most likely. Patient with underlying dementia.  -will continue tx for PNA  -continue supportive care and oxygen supplementation  -mentation back to baseline at discharge  3. Dysphagia: most likely due to encephalopathy and deconditioning  -improving  -slowly advance and adjust as needed. (at discharge on dys type 2 and nectar thick liquids) -patient's medications to be crushed with apple sauce  4. Elevated INR: s/p Vit K  -continue coumadin level closely -he was reverted and become subtherapeutic for a day or two; now slightly supratherapeutic again -INR 3.5 -amiodarone and now antibiotics for PNA contributing to INR elevation -no overt bleeding appreciated  5.BPH: continue rapiflo  6. Hypothyroidism: continue synthroid   7. Atrial fibrillation: continue amiodarone, diltiazem, coreg and coumadin.  -INR slightly supratherapeutic at discharge -will need close INR checks and coumadin dose adjustment -next coumadin dose 01/27/14  8.HTN: stable and well controlled. 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 Continue monitoring  10-severe protein calorie malnutrition: will continue ensure pudding and encourage PO intake.   11-chronic pain: will resume home pain meds and try to minimize as much as  possible.   12-chronic diastolic CHF: essentially compensated.  -will continue PO lasix.  -monitor intake/output and daily weight. -low sodium/heart healthy diet   13-physical deconditioning: will need SNF at discharge for rehab. Continue PT and SPL eval as per facility protocol.   Procedures:  See below for x-ray reports  Consultations:  None   Discharge Exam: Filed Vitals:   01/25/14 1135  BP: 98/61  Pulse: 56  Temp:   Resp:    General: Afebrile, AAOX2, calm and cooperative with examination  Cardiovascular: s1 and s2, no rubs or gallops, no JVD  Respiratory: improve air movement, scattered rhonchi, no exp wheezing and just mild decreased BS at abses  Abdomen: soft, NT, ND, positive BS Musculoskeletal: no edema, no cyanosis    Discharge Instructions  Discharge Orders   Future Appointments Provider Department Dept Phone   02/04/2014 9:15 AM Karie Schwalbe, MD [facility] [phone]   05/18/2014 9:30 AM Karie Schwalbe, MD [facility] [phone]   Future Orders Complete By Expires   Diet - low sodium heart healthy  As directed    Discharge instructions  As directed    Comments:     Take medications as prescribed Aspiration precautions and follow up with Speech therapy at the facility Dysphagia 2 diet with nectar thick liquids BMET in 5 days (to follow electrolytes and renal function) CBC in 5 days to follow WBC's trend CXR in 4 weeks to follow resolution of infiltrates Oxygen supplementation 2-3L 24/7 (wean slowly if possible to RA) Physical rehabilitation as per facility protocol  Follow a low sodium heart healthy diet Ensure good hydration Close follow up to INR and coumadin adjustment (next coumadin dose on 2/5)       Medication List    STOP taking these medications       aspirin 81 MG tablet  Replaced by:  aspirin 81 MG chewable tablet     phenylephrine 0.5 % nasal solution  Commonly known as:   NEO-SYNEPHRINE      TAKE these medications       acetaminophen 325 MG tablet  Commonly known as:  TYLENOL  Take 2 tablets (650 mg total) by mouth every 6 (six) hours as needed for mild pain, fever or headache.     albuterol 108 (90 BASE) MCG/ACT inhaler  Commonly known as:  PROVENTIL HFA;VENTOLIN HFA  Inhale 2 puffs into the lungs every 4 (four) hours as needed for wheezing or shortness of breath.     amiodarone 200 MG tablet  Commonly known as:  PACERONE  Take 1 tablet (200 mg total) by mouth 2 (two) times daily.     amitriptyline 25 MG tablet  Commonly known as:  ELAVIL  Take 25 mg by mouth at bedtime.     aspirin 81 MG chewable tablet  Chew 1 tablet (81 mg total) by mouth daily.     budesonide 0.25 MG/2ML nebulizer solution  Commonly known as:  PULMICORT  Take 2 mLs (0.25 mg total) by nebulization 2 (two) times daily.     carvedilol 3.125 MG tablet  Commonly known as:  COREG  Take 3.125 mg by mouth 2 (two) times daily with a meal.     diltiazem 300 MG 24 hr capsule  Commonly known as:  CARDIZEM CD  Take  1 capsule (300 mg total) by mouth daily.     feeding supplement (ENSURE) Pudg  Take 1 Container by mouth 3 (three) times daily between meals.     fluticasone 50 MCG/ACT nasal spray  Commonly known as:  FLONASE  Place 2 sprays into the nose daily. 2 spray, Each Nare, Daily     food thickener Powd  Commonly known as:  THICK IT  As needed to make liquids nectar consistently     furosemide 40 MG tablet  Commonly known as:  LASIX  Take 1 tablet (40 mg total) by mouth daily.     guaiFENesin 100 MG/5ML Soln  Commonly known as:  ROBITUSSIN  Take 5 mLs by mouth every 4 (four) hours as needed for cough or to loosen phlegm.     HYDROcodone-acetaminophen 10-325 MG per tablet  Commonly known as:  NORCO  Take 1 tablet by mouth every 8 (eight) hours as needed for severe pain.     ipratropium 17 MCG/ACT inhaler  Commonly known as:  ATROVENT HFA  Inhale 2 puffs into the  lungs 4 (four) times daily.     levothyroxine 25 MCG tablet  Commonly known as:  SYNTHROID, LEVOTHROID  Take 25 mcg by mouth daily before breakfast.     moxifloxacin 400 MG tablet  Commonly known as:  AVELOX  Take 1 tablet (400 mg total) by mouth daily at 8 pm. Take for 5 more days to finish abx therapy     nitroGLYCERIN 0.4 MG SL tablet  Commonly known as:  NITROSTAT  Place 0.4 mg under the tongue every 5 (five) minutes as needed for chest pain. x3 doses for for chest pain     oxymetazoline 0.05 % nasal spray  Commonly known as:  AFRIN  Place 2 sprays into both nostrils 2 (two) times daily. Do not use for more than 5 days straight     polyethylene glycol packet  Commonly known as:  MIRALAX / GLYCOLAX  Take 17 g by mouth daily as needed for mild constipation.     potassium chloride SA 20 MEQ tablet  Commonly known as:  K-DUR,KLOR-CON  Take 1 tablet (20 mEq total) by mouth daily.     RAPAFLO 8 MG Caps capsule  Generic drug:  silodosin  Take 8 mg by mouth daily.     warfarin 5 MG tablet  Commonly known as:  COUMADIN  Take 0.5-1 tablets (2.5-5 mg total) by mouth daily. Take 5mg  on M-W-F and then the other days take 2.5mg   Start taking on:  01/27/2014       Allergies  Allergen Reactions  . Fenofibrate Other (See Comments)    REACTION: "ran me up a wall"  . Penicillins Other (See Comments)    blisters  . Atorvastatin Other (See Comments)    REACTION: severe muscle aches  . Ezetimibe-Simvastatin Other (See Comments)    REACTION: severe muscle aches       Follow-up Information   Follow up with Tillman Abideichard Letvak, MD. Schedule an appointment as soon as possible for a visit in 2 weeks.   Specialties:  Internal Medicine, Pediatrics   Contact information:   [address] [place] [address] [phone]        The results of significant diagnostics from this hospitalization (including imaging, microbiology, ancillary and laboratory) are listed below for  reference.    Significant Diagnostic Studies: Dg Chest 2 View  01/20/2014   CLINICAL DATA:  Shortness of breath  EXAM: CHEST  2 VIEW  COMPARISON:  01/08/2014  FINDINGS: Cardiac shadow is stable. New right upper lobe infiltrate is seen when compare with the prior exam. Diffuse fibrotic changes are again identified throughout both lungs. Changes of prior granulomatous disease are noted as well.  IMPRESSION: New right upper lobe infiltrate superimposed over chronic changes bilaterally.   Electronically Signed   By: Alcide Clever M.D.   On: 01/20/2014 21:19   Ct Head Wo Contrast  01/21/2014   CLINICAL DATA:  Altered level of consciousness  EXAM: CT HEAD WITHOUT CONTRAST  TECHNIQUE: Contiguous axial images were obtained from the base of the skull through the vertex without intravenous contrast.  COMPARISON:  None.  FINDINGS: Prominence of the sulci, cisterns, and ventricles, in keeping with volume loss. Periventricular and subcortical white matter hypodensities are advanced and most in keeping with chronic microangiopathic change. No overt hydrocephalus. No definite CT evidence of an acute infarction. No intraparenchymal hemorrhage, mass, mass effect, or abnormal extra-axial fluid collection. The visualized paranasal sinuses and mastoid air cells are predominantly clear.  IMPRESSION: Volume loss and white matter changes as above. No definite CT evidence of acute intracranial abnormality.   Electronically Signed   By: Jearld Lesch M.D.   On: 01/21/2014 02:06   Dg Chest Portable 1 View  01/08/2014   CLINICAL DATA:  Shortness of breath, chronic cough, congestion  EXAM: PORTABLE CHEST - 1 VIEW  COMPARISON:  07/30/2013  FINDINGS: Chronic interstitial markings/ emphysematous changes. Biapical pleural parenchymal scarring. No focal consolidation. No pleural effusion or pneumothorax.  Heart is normal in size.  IMPRESSION: No evidence of acute cardiopulmonary disease.  Chronic interstitial markings/emphysematous  changes with biapical pleural parenchymal scarring.   Electronically Signed   By: Charline Bills M.D.   On: 01/08/2014 22:26   Dg Swallowing Func-speech Pathology  01/21/2014   Pablo Lawrence, CCC-SLP     01/21/2014  4:30 PM Objective Swallowing Evaluation: Modified Barium Swallowing Study   Patient Details  Name: [name] MRN: [id number] Date of Birth: 1928-09-30  Today's Date: 01/21/2014 Time: 1120-1135 SLP Time Calculation (min): 15 min  Past Medical History:  Past Medical History  Diagnosis Date  . CAD (coronary artery disease)   . Hyperlipidemia   . Kidney stones   . BPH (benign prostatic hypertrophy)   . Osteoarthritis   . COPD (chronic obstructive pulmonary disease)   . Hypothyroidism   . Atrial fibrillation   . Impaired fasting glucose   . Myocardial infarction   . Hypertension   . Shortness of breath   . CHF (congestive heart failure)   . GERD (gastroesophageal reflux disease)    Past Surgical History:  Past Surgical History  Procedure Laterality Date  . Inguinal hernia repair  09/2004    left  . Back surgery  1990's    x3  . Transurethral resection of prostate  2002  . Pleural scarification  1980    right  . Esophagogastroduodenoscopy  04/2002    negative  . Bladder stone removal  06/2006  . Colonoscopy Left 04/13/2013    Procedure: COLONOSCOPY;  Surgeon: Willis Modena, MD;   Location: [facility] ENDOSCOPY;  Service: Endoscopy;  Laterality: Left;   HPI:  Patient is an 80 y.o. male with PMH: COPD, GERD, dyslipidemia,  atrial fibrillation, CAD, with four recent hospital admissions.  Patient had recently been admitted for atrial fibrillation and  diastolic dysfunction and dyspnea on exertion. Apparently, he has  not been eating, not taking medications appropriately, not  keeping his  oxygen regularly, has a productive cough and  confusion.     Assessment / Plan / Recommendation Clinical Impression  Dysphagia Diagnosis: Moderate oral phase dysphagia;Severe  pharyngeal phase dysphagia Clinical impression:  Patient presents with a moderate oral and  moderate-severe pharyngeal phase dysphagia. Oral phase  characterized by decreased lingual ROM and strength, decreased  mastication and lingual manipulation. Pharyngeal phase  characterized by swallow initiation delays to vallecular sinus,  decreased hyolaryngeal excursion, poor epiglottic closure to  protect airway and moderate vallecular residuals remaining post  initial swallow secondary to pharyngeal weakness and poor  laryngeal elevation. Patient exhibited flash penetration with  nectar thick liquids and deep penetration which did not fully  clear with thin liquids.    Treatment Recommendation  Therapy as outlined in treatment plan below    Diet Recommendation Dysphagia 1 (Puree);Nectar-thick liquid   Liquid Administration via: Cup;No straw Medication Administration: Crushed with puree Supervision: Full supervision/cueing for compensatory strategies Compensations: Slow rate;Small sips/bites;Follow solids with  liquid;Multiple dry swallows after each bite/sip Postural Changes and/or Swallow Maneuvers: Seated upright 90  degrees;Upright 30-60 min after meal    Other  Recommendations Recommended Consults: MBS Oral Care Recommendations: Oral care BID Other Recommendations: Order thickener from pharmacy;Prohibited  food (jello, ice cream, thin soups);Remove water pitcher;Clarify  dietary restrictions   Follow Up Recommendations  24 hour supervision/assistance;Skilled Nursing facility    Frequency and Duration min 2x/week  2 weeks   Pertinent Vitals/Pain     SLP Swallow Goals  Patient will tolerate Dys 1 (puree), nectar thick liquids diet  with moderate cues to follow swallow precautions and no overt s/s  aspiration.   General Date of Onset: 01/20/14 HPI: Patient is an 80 y.o. male with PMH: COPD, GERD,  dyslipidemia, atrial fibrillation, CAD, with four recent hospital  admissions. Patient had recently been admitted for atrial  fibrillation and diastolic dysfunction and  dyspnea on exertion.  Apparently, he has not been eating, not taking medications  appropriately, not keeping his oxygen regularly, has a productive  cough and confusion. Type of Study: Modified Barium Swallowing Study Reason for Referral: Objectively evaluate swallowing function Previous Swallow Assessment: BSE Diet Prior to this Study: NPO Temperature Spikes Noted: No Respiratory Status: Nasal cannula History of Recent Intubation: No Behavior/Cognition: Alert;Cooperative;Pleasant mood;Requires  cueing;Confused Oral Cavity - Dentition: Adequate natural dentition Self-Feeding Abilities: Needs assist Patient Positioning: Upright in chair Baseline Vocal Quality: Low vocal intensity;Wet Volitional Cough: Weak Volitional Swallow: Unable to elicit Pharyngeal Secretions: Not observed secondary MBS    Reason for Referral Objectively evaluate swallowing function   Oral Phase Oral Preparation/Oral Phase Oral Phase: Impaired Oral - Solids Oral - Puree: Weak lingual manipulation;Lingual pumping;Reduced  posterior propulsion Oral - Regular: Impaired mastication;Reduced posterior  propulsion;Weak lingual manipulation   Pharyngeal Phase Pharyngeal Phase Pharyngeal Phase: Impaired Pharyngeal - Honey Pharyngeal - Honey Teaspoon: Premature spillage to  valleculae;Delayed swallow initiation;Reduced tongue base  retraction;Reduced airway/laryngeal closure;Pharyngeal residue -  valleculae Pharyngeal - Nectar Pharyngeal - Nectar Cup: Delayed swallow initiation;Premature  spillage to valleculae;Pharyngeal residue - valleculae;Reduced  tongue base retraction;Reduced airway/laryngeal  closure;Penetration/Aspiration during swallow Penetration/Aspiration details (nectar cup): Material enters  airway, remains ABOVE vocal cords then ejected out Pharyngeal - Thin Pharyngeal - Thin Cup: Premature spillage to  valleculae;Pharyngeal residue - valleculae;Penetration/Aspiration  after swallow;Reduced airway/laryngeal closure Penetration/Aspiration  details (thin cup): Material enters  airway, CONTACTS cords and not ejected out Pharyngeal - Solids Pharyngeal - Puree: Delayed swallow initiation;Reduced laryngeal  elevation;Reduced tongue base retraction;Pharyngeal residue -  valleculae  Pharyngeal - Regular: Delayed swallow initiation;Premature  spillage to valleculae;Pharyngeal residue - valleculae;Reduced  tongue base retraction  Cervical Esophageal Phase    GO    Cervical Esophageal Phase Cervical Esophageal Phase: Leonarda Salon         Pablo Lawrence 01/21/2014, 4:29 PM  Angela Nevin, MA, CCC-SLP [facility] Speech-Language Pathologist      Microbiology: Recent Results (from the past 240 hour(s))  CULTURE, EXPECTORATED SPUTUM-ASSESSMENT     Status: None   Collection Time    01/23/14 10:18 AM      Result Value Range Status   Specimen Description SPUTUM   Final   Special Requests NONE   Final   Sputum evaluation     Final   Value: THIS SPECIMEN IS ACCEPTABLE. RESPIRATORY CULTURE REPORT TO FOLLOW.   Report Status 01/23/2014 FINAL   Final  CULTURE, RESPIRATORY (NON-EXPECTORATED)     Status: None   Collection Time    01/23/14 10:18 AM      Result Value Range Status   Specimen Description SPUTUM   Final   Special Requests NONE   Final   Gram Stain     Final   Value: ABUNDANT WBC PRESENT, PREDOMINANTLY PMN     FEW SQUAMOUS EPITHELIAL CELLS PRESENT     MODERATE GRAM NEGATIVE RODS     FEW GRAM POSITIVE COCCI IN PAIRS     FEW GRAM POSITIVE RODS   Culture     Final   Value: NORMAL OROPHARYNGEAL FLORA     Performed at [facility]   Report Status 01/25/2014 FINAL   Final     Labs: Basic Metabolic Panel:  Recent Labs Lab 01/20/14 2159 01/20/14 2210 01/21/14 0410 01/22/14 0239 01/23/14 0505 01/25/14 0445  NA 138 136* 138 139 141 139  K 4.6 4.3 4.2 4.7 3.5* 3.9  CL 98 100 100 101 104 100  CO2 24  --  24 24 23 24   GLUCOSE 110* 115* 109* 94 126* 83  BUN 29* 31* 25* 28* 26* 23  CREATININE 0.88 0.90 0.80 0.92 0.77 0.89  CALCIUM  9.7  --  9.5 9.5 9.1 8.9  MG  --   --   --   --   --  2.3   Liver Function Tests:  Recent Labs Lab 01/21/14 0410  AST 29  ALT 27  ALKPHOS 106  BILITOT 0.6  PROT 6.8  ALBUMIN 2.3*   CBC:  Recent Labs Lab 01/20/14 2159 01/20/14 2210 01/21/14 0410 01/25/14 0445  WBC 17.1*  --  15.3* 13.4*  NEUTROABS 15.2*  --  13.6*  --   HGB 11.6* 12.2* 10.7* 11.1*  HCT 33.3* 36.0* 30.9* 33.4*  MCV 87.4  --  86.6 89.8  PLT 362  --  386 465*   BNP: BNP (last 3 results)  Recent Labs  04/08/13 0818 01/08/14 2225 01/20/14 2159  PROBNP 1755.0* 1259.0* 1528.0*    Signed:  Samael Blades  [facility] 01/25/2014, 12:10 PM

## 2014-01-25 NOTE — Progress Notes (Signed)
Pt. Alert and stable this am. No s/s of distress noted. Pt. C/o pain during the night. PRN pain medication administered and effective. Pt. Resting in bed quietly this am. Call light within reach. RN will continue to monitor pt. For changes in condition. [name], [name]

## 2014-01-25 NOTE — Progress Notes (Signed)
ANTICOAGULATION CONSULT NOTE - Follow Up Consult  Pharmacy Consult for warfarin Indication: atrial fibrillation  Allergies  Allergen Reactions  . Fenofibrate Other (See Comments)    REACTION: "ran me up a wall"  . Penicillins Other (See Comments)    blisters  . Atorvastatin Other (See Comments)    REACTION: severe muscle aches  . Ezetimibe-Simvastatin Other (See Comments)    REACTION: severe muscle aches    Patient Measurements: Height: 6' (182.9 cm) Weight: 93 lb 7.6 oz (42.4 kg) IBW/kg (Calculated) : 77.6  Vital Signs: Temp: 97.7 F (36.5 C) (02/03 0604) Temp src: Oral (02/03 0604) BP: 106/55 mmHg (02/03 0900) Pulse Rate: 50 (02/03 0900)  Labs:  Recent Labs  01/23/14 0505 01/24/14 0516 01/25/14 0445  HGB  --   --  11.1*  HCT  --   --  33.4*  PLT  --   --  465*  LABPROT 24.1* 30.0* 34.0*  INR 2.24* 2.99* 3.52*  CREATININE 0.77  --  0.89    Estimated Creatinine Clearance: 37.7 ml/min (by C-G formula based on Cr of 0.89).  Assessment: 85 yom continues on coumadin for hx afib. INR is supratherapeutic today at 3.52. H/H low and plts are elevated. No bleeding noted. Pt is also on amiodarone which can increase the INR but this was a PTA med.   Goal of Therapy:  INR 2-3   Plan:  1. No coumadin tonight 2. F/u AM INR  Lanaiya Lantry, Drake Leachachel Lynn 01/25/2014,10:13 AM

## 2014-01-25 NOTE — Progress Notes (Signed)
[facility] SBAR and Kardex used to give report. Opportunity to ask questions. Pt is being transported to facility by ambulance. Transportation has been set up for 4 pm

## 2014-01-26 ENCOUNTER — Non-Acute Institutional Stay (SKILLED_NURSING_FACILITY): Payer: Medicare Other | Admitting: Adult Health

## 2014-01-26 ENCOUNTER — Encounter: Payer: Self-pay | Admitting: Adult Health

## 2014-01-26 DIAGNOSIS — Z7901 Long term (current) use of anticoagulants: Secondary | ICD-10-CM

## 2014-01-26 DIAGNOSIS — I2510 Atherosclerotic heart disease of native coronary artery without angina pectoris: Secondary | ICD-10-CM

## 2014-01-26 DIAGNOSIS — K5900 Constipation, unspecified: Secondary | ICD-10-CM | POA: Insufficient documentation

## 2014-01-26 DIAGNOSIS — E039 Hypothyroidism, unspecified: Secondary | ICD-10-CM

## 2014-01-26 DIAGNOSIS — I4891 Unspecified atrial fibrillation: Secondary | ICD-10-CM

## 2014-01-26 DIAGNOSIS — R1310 Dysphagia, unspecified: Secondary | ICD-10-CM | POA: Insufficient documentation

## 2014-01-26 DIAGNOSIS — J449 Chronic obstructive pulmonary disease, unspecified: Secondary | ICD-10-CM

## 2014-01-26 DIAGNOSIS — J189 Pneumonia, unspecified organism: Secondary | ICD-10-CM

## 2014-01-26 DIAGNOSIS — N400 Enlarged prostate without lower urinary tract symptoms: Secondary | ICD-10-CM

## 2014-01-26 DIAGNOSIS — G589 Mononeuropathy, unspecified: Secondary | ICD-10-CM

## 2014-01-26 DIAGNOSIS — I5032 Chronic diastolic (congestive) heart failure: Secondary | ICD-10-CM

## 2014-01-26 MED ORDER — SENNOSIDES-DOCUSATE SODIUM 8.6-50 MG PO TABS: 2.0000 | ORAL_TABLET | Freq: Every evening | ORAL | Status: AC | PRN

## 2014-01-26 NOTE — Clinical Social Work Placement (Addendum)
    Clinical Social Work Department CLINICAL SOCIAL WORK PLACEMENT NOTE 01/26/2014  Patient:  [name],[name]  Account Number:  [id number] Admit date:  01/20/2014  Clinical Social Worker:  Lupita LeashNNA Ethin Drummond, LCSWA  Date/time:  01/24/2014 11:33 AM  Clinical Social Work is seeking post-discharge placement for this patient at the following level of care:   SKILLED NURSING   (*CSW will update this form in Epic as items are completed)   01/24/2014  Patient/family provided with [facility] Department of Clinical Social Work's list of facilities offering this level of care within the geographic area requested by the patient (or if unable, by the patient's family).  01/24/2014  Patient/family informed of their freedom to choose among providers that offer the needed level of care, that participate in Medicare, Medicaid or managed care program needed by the patient, have an available bed and are willing to accept the patient.  01/24/2014  Patient/family informed of [facility]' ownership interest in [facility], as well as of the fact that they are under no obligation to receive care at this facility.  PASARR submitted to EDS on 01/24/2014 PASARR number received from EDS on 01/24/2014  FL2 transmitted to all facilities in geographic area requested by pt/family on   FL2 transmitted to all facilities within larger geographic area on   Patient informed that his/her managed care company has contracts with or will negotiate with  certain facilities, including the following:   NA     Patient/family informed of bed offers received:  01/25/2014 [facility] Physician recommends and patient chooses bed at    [facility] be [facility]  01/25/2014 Patient to be transferred to facility by Ambulance  Sharin Mons(PTAR)  The following physician request were entered in Epic:   Additional Comments: 01/25/14  OK for d/c today to SNF. Patient and daughter are agreeable  to SNF placement; daughter will notify her mother of d/c plan.  Notified Nursing of d/c and the will call report.  No further CSW needs identified.  CSW signing off. Lorri Frederickonna T. Meera Vasco, LCSWA [phone]

## 2014-01-26 NOTE — Clinical Social Work Psychosocial (Addendum)
    Clinical Social Work Department BRIEF PSYCHOSOCIAL ASSESSMENT 01/26/2014  [facility]     Account Number:  [id number]     Admit date:  01/20/2014  Clinical Social Worker:  Tiburcio PeaROWDER,Lenae Wherley, LCSWA  Date/Time:  01/24/2014 10:00 AM  Referred by:  Physician  Date Referred:  01/24/2014 Referred for  SNF Placement   Other Referral:   Interview type:  Other - See comment Other interview type:   Patient and telephonic interview with daugher Ardeen FillersLisa Bray    PSYCHOSOCIAL DATA Living Status:  WIFE Admitted from facility:   Level of care:   Primary support name:  Ardeen FillersLisa Bray  (c) [phone] Primary support relationship to patient:  CHILD, ADULT Degree of support available:   strong support    Patient's Michaele Offerwie is very invovled but her health is poor    CURRENT CONCERNS Current Concerns  Post-Acute Placement   Other Concerns:    SOCIAL WORK ASSESSMENT / PLAN 80 year old male admitted from home where he lives with his wife.  Patient is alert but exhibits some periods of disorientation.  Physical Therapy has recommended SNF placement for pateint if family cannot provide 24 hour care. Per patient's permission, spoke with his daughter Ardeen FillersLisa Bray who is helping her parents. She stated that her mother is currently unable to manage patient at this time and they all agree to SNF placement. They are hoping this will be short term and that patent can return home with his wife.  Discssed SNF bed search process and daughter/patient agreed to search- their prefernce is [facility] as they live in the area. Fl2 initiated and placed on chart for MD"s signature.   Assessment/plan status:  Psychosocial Support/Ongoing Assessment of Needs Other assessment/ plan:   Information/referral to community resources:   SNF bed list left in patient's room for daughter to review    PATIENT'S/FAMILY'S RESPONSE TO PLAN OF CARE: Patient is alert and oriented to person; he is experiencing some periods of confusion.  He is agreeable to SNF search but wants his daughter to hep with decision making. Daughter is fully supportive of SNF placement and [facility]. CSW will assist with placement process.  Patient was noted to be non-reactive to need for SNF placement and stated "I'll do what i have to do."  His daughter was relieved that placement process is in plac.

## 2014-01-26 NOTE — Progress Notes (Signed)
Patient ID: [name], male   DOB: 08-16-1928, 80 y.o.   MRN: [id number]     [facility]  Allergies  Allergen Reactions  . Fenofibrate Other (See Comments)    REACTION: "ran me up a wall"  . Penicillins Other (See Comments)    blisters  . Atorvastatin Other (See Comments)    REACTION: severe muscle aches  . Ezetimibe-Simvastatin Other (See Comments)    REACTION: severe muscle aches     Chief Complaint  Patient presents with  . Hospitalization Follow-up    HPI:  He has copd and was hospitalized for pneumonia and hypoxia with metabolic encephalopathy. He is here for short term rehab with his goal to return back home. He is not voicing concerns or complaints at this time. The nursing staff is not voicing concerns at this time.    Past Medical History  Diagnosis Date  . CAD (coronary artery disease)   . Hyperlipidemia   . Kidney stones   . BPH (benign prostatic hypertrophy)   . Osteoarthritis   . COPD (chronic obstructive pulmonary disease)   . Hypothyroidism   . Atrial fibrillation   . Impaired fasting glucose   . Myocardial infarction   . Hypertension   . Shortness of breath   . CHF (congestive heart failure)   . GERD (gastroesophageal reflux disease)     Past Surgical History  Procedure Laterality Date  . Inguinal hernia repair  09/2004    left  . Back surgery  1990's    x3  . Transurethral resection of prostate  2002  . Pleural scarification  1980    right  . Esophagogastroduodenoscopy  04/2002    negative  . Bladder stone removal  06/2006  . Colonoscopy Left 04/13/2013    Procedure: COLONOSCOPY;  Surgeon: Willis Modena, MD;  Location: [facility] ENDOSCOPY;  Service: Endoscopy;  Laterality: Left;    VITAL SIGNS BP 125/71  Pulse 71  Ht 5\' 7"  (1.702 m)  Wt 109 lb (49.442 kg)  BMI 17.07 kg/m2   Patient's Medications  New Prescriptions   No medications on file  Previous Medications   ACETAMINOPHEN (TYLENOL) 325 MG TABLET    Take 2 tablets (650 mg  total) by mouth every 6 (six) hours as needed for mild pain, fever or headache.   ALBUTEROL (PROVENTIL HFA;VENTOLIN HFA) 108 (90 BASE) MCG/ACT INHALER    Inhale 2 puffs into the lungs every 4 (four) hours as needed for wheezing or shortness of breath.   AMIODARONE (PACERONE) 200 MG TABLET    Take 1 tablet (200 mg total) by mouth 2 (two) times daily.   AMITRIPTYLINE (ELAVIL) 25 MG TABLET    Take 25 mg by mouth at bedtime.   ASPIRIN 81 MG CHEWABLE TABLET    Chew 1 tablet (81 mg total) by mouth daily.   BUDESONIDE (PULMICORT) 0.25 MG/2ML NEBULIZER SOLUTION    Take 2 mLs (0.25 mg total) by nebulization 2 (two) times daily.   CARVEDILOL (COREG) 3.125 MG TABLET    Take 3.125 mg by mouth 2 (two) times daily with a meal.   DILTIAZEM (CARDIZEM CD) 300 MG 24 HR CAPSULE    Take 1 capsule (300 mg total) by mouth daily.   FEEDING SUPPLEMENT, ENSURE, (ENSURE) PUDG    Take 1 Container by mouth 3 (three) times daily between meals.   FLUTICASONE (FLONASE) 50 MCG/ACT NASAL SPRAY    Place 2 sprays into the nose daily. 2 spray, Each Nare, Daily   FOOD THICKENER (THICK  IT) POWD    As needed to make liquids nectar consistently   FUROSEMIDE (LASIX) 40 MG TABLET    Take 1 tablet (40 mg total) by mouth daily.   GUAIFENESIN (ROBITUSSIN) 100 MG/5ML SOLN    Take 5 mLs by mouth every 4 (four) hours as needed for cough or to loosen phlegm.   HYDROCODONE-ACETAMINOPHEN (NORCO) 10-325 MG PER TABLET    Take 1 tablet by mouth every 8 (eight) hours as needed for severe pain.   IPRATROPIUM (ATROVENT HFA) 17 MCG/ACT INHALER    Inhale 2 puffs into the lungs 4 (four) times daily.   LEVOTHYROXINE (SYNTHROID, LEVOTHROID) 25 MCG TABLET    Take 25 mcg by mouth daily before breakfast.   MOXIFLOXACIN (AVELOX) 400 MG TABLET    Take 1 tablet (400 mg total) by mouth daily at 8 pm. Take for 5 more days to finish abx therapy   NITROGLYCERIN (NITROSTAT) 0.4 MG SL TABLET    Place 0.4 mg under the tongue every 5 (five) minutes as needed for chest pain.  x3 doses for for chest pain   OXYMETAZOLINE (AFRIN) 0.05 % NASAL SPRAY    Place 2 sprays into both nostrils 2 (two) times daily. Do not use for more than 5 days straight   POLYETHYLENE GLYCOL (MIRALAX / GLYCOLAX) PACKET    Take 17 g by mouth daily as needed for mild constipation.    POTASSIUM CHLORIDE SA (K-DUR,KLOR-CON) 20 MEQ TABLET    Take 1 tablet (20 mEq total) by mouth daily.   RAPAFLO 8 MG CAPS    Take 8 mg by mouth daily.    WARFARIN (COUMADIN) 5 MG TABLET    Take 0.5-1 tablets (2.5-5 mg total) by mouth daily. Take 5mg  on M-W-F and then the other days take 2.5mg   Modified Medications   No medications on file  Discontinued Medications   No medications on file    SIGNIFICANT DIAGNOSTIC EXAMS  01-08-14: chest x-ray: No evidence of acute cardiopulmonary disease. Chronic interstitial markings/emphysematous changes with biapical pleural parenchymal scarring.  01-10-14: TEE: Left ventricle: The cavity size was normal. Systolic function was normal. The estimated ejection fraction was in the range of 50% to 55%. Wall motion was normal; there were no regional wall motion abnormalities. - Aortic valve: Trileaflet; normal thickness, mildly calcified leaflets.  01-20-14: chest x-ray: New right upper lobe infiltrate superimposed over chronic changes bilaterally.  01-21-14: ct of head: Volume loss and white matter changes as above. No definite CT evidence of acute intracranial abnormality.     LABS REVIEWED:   01-09-14: tsh 8.916; chol 185; ldl 117; trig 110 01-20-14: wbc 17.1; hgb 11.6; hct 33.0; mcv 87.4 plt  362; glucose 110; bun 29; creat 0.88; k+4.6; na++138; BNP 1528.0 01-21-14: wbc 15.3; hgb 10.7; hct 30.9; mcv 86.6; plt 386 ;glucose 109; bun 25; creat 0.80; k+4.2; na++138; liver normal albumin 2.3 01-25-14: wbc 13.4; hgb 11.1; hct 33.4; mcv 89.8; plt 465; glucose 83; bun 23; creat 0.89; k+3.9; na++139; mag 2.3     Review of Systems  Constitutional: Negative for malaise/fatigue.    Respiratory: Positive for cough and sputum production. Negative for shortness of breath and wheezing.        Is improving; is using flutter valve   Cardiovascular: Negative for chest pain, palpitations and leg swelling.  Gastrointestinal: Negative for heartburn and constipation.  Musculoskeletal: Negative for joint pain and myalgias.  Skin: Negative.   Psychiatric/Behavioral: Negative for depression. The patient is not nervous/anxious.  Physical Exam  Constitutional: No distress.  frail  Neck: Neck supple. No JVD present.  Cardiovascular: Normal rate and intact distal pulses.   Heart rate irregular   Respiratory: Effort normal. No respiratory distress. He has no wheezes.  Has diminished sounds throughout is using 02  GI: Soft. Bowel sounds are normal. He exhibits no distension. There is no tenderness.  Musculoskeletal: He exhibits no edema.  Is able to move all extremities.   Neurological: He is alert.  Skin: Skin is warm and dry. He is not diaphoretic.  Psychiatric: He has a normal mood and affect.     ASSESSMENT/ PLAN:  1. Afib/anticoagulation management: he is presently stable his heart rate is under control. his inr is 4.0 today; will hold coumadin today and will recheck inr in the am.  Will continue amiodarone 200 mg twice daily and cardizem cd 300 mg daily for rate control.  Will monitor his status.   2. Diastolic heart failure: he is presently stable will continue lasix 40 mg daily with k+ 20 meq daily; will continue coreg 3.125 mg twice daily and will monitor his status.   3. CAD: no complaint of chest pain present. Will continue asa 81 mg daily  coreg 3.125 mg twice daily and ntg prn will monitor his status.   4. COPD: he is presently stable will continue his pulmicort neb twice daily flonase twice daily atrovent 1 puffs four times daily and albuterol inhaler 2 puffs every 4 hours as needed   5. Pneumonia: will continue avelox for a total of 5 days. Will begin to  wean him off 02 next week to keep 02 sats >=90%; and will repeat chest x-ray in 4 weeks. Will monitor his status   6. Dysphagia: no signs of aspiration present. Will continue nectar thick liquids at this time and will monitor   7. Constipation: will stop the miralax at this time as he is on nectar thick liquids; will use senna s 2 tabs daily as needed. Will monitor   8. BPH: will continue silodosin 8 mg daily   9. Hypothyroidism: will continue synthroid 25 mcg daily will recheck tsh in 4 weeks.   10. Peripheral neuropathy: no complaints of pain present is taking elavil 25 mg nightly and will monitor   Will check cbc and bmp next week.   Time spent with patient 50 minutes

## 2014-01-27 ENCOUNTER — Encounter: Payer: Self-pay | Admitting: Adult Health

## 2014-01-27 ENCOUNTER — Non-Acute Institutional Stay (SKILLED_NURSING_FACILITY): Payer: Medicare Other | Admitting: Adult Health

## 2014-01-27 DIAGNOSIS — Z7901 Long term (current) use of anticoagulants: Secondary | ICD-10-CM

## 2014-01-27 DIAGNOSIS — I4891 Unspecified atrial fibrillation: Secondary | ICD-10-CM

## 2014-01-27 NOTE — Progress Notes (Signed)
Patient ID: [name] GowdaDanah Holland, male   DOB: 1928-12-16, 80 y.o.   MRN: [id number]     [name]  Allergies  Allergen Reactions  . Fenofibrate Other (See Comments)    REACTION: "ran me up a wall"  . Penicillins Other (See Comments)    blisters  . Atorvastatin Other (See Comments)    REACTION: severe muscle aches  . Ezetimibe-Simvastatin Other (See Comments)    REACTION: severe muscle aches     Chief Complaint  Patient presents with  . Acute Visit    coumadin management     HPI:  He is on long term coumadin therapy for his afib. His inr is 3.7; his coumadin is presently on hold. He is on abt at this time. There are no concerns being voiced by the nursing at this time.    Past Medical History  Diagnosis Date  . CAD (coronary artery disease)   . Hyperlipidemia   . Kidney stones   . BPH (benign prostatic hypertrophy)   . Osteoarthritis   . COPD (chronic obstructive pulmonary disease)   . Hypothyroidism   . Atrial fibrillation   . Impaired fasting glucose   . Myocardial infarction   . Hypertension   . Shortness of breath   . CHF (congestive heart failure)   . GERD (gastroesophageal reflux disease)     Past Surgical History  Procedure Laterality Date  . Inguinal hernia repair  09/2004    left  . Back surgery  1990's    x3  . Transurethral resection of prostate  2002  . Pleural scarification  1980    right  . Esophagogastroduodenoscopy  04/2002    negative  . Bladder stone removal  06/2006  . Colonoscopy Left 04/13/2013    Procedure: COLONOSCOPY;  Surgeon: Willis ModenaWilliam Outlaw, MD;  [facility];  Service: Endoscopy;  Laterality: Left;    VITAL SIGNS BP 118/57  Pulse 64  Ht 5\' 7"  (1.702 m)  Wt 109 lb (49.442 kg)  BMI 17.07 kg/m2   Patient's Medications  New Prescriptions   No medications on file  Previous Medications   ACETAMINOPHEN (TYLENOL) 325 MG TABLET    Take 2 tablets (650 mg total) by mouth every 6 (six) hours as needed for mild pain, fever  or headache.   ALBUTEROL (PROVENTIL HFA;VENTOLIN HFA) 108 (90 BASE) MCG/ACT INHALER    Inhale 2 puffs into the lungs every 4 (four) hours as needed for wheezing or shortness of breath.   AMIODARONE (PACERONE) 200 MG TABLET    Take 1 tablet (200 mg total) by mouth 2 (two) times daily.   AMITRIPTYLINE (ELAVIL) 25 MG TABLET    Take 25 mg by mouth at bedtime.   ASPIRIN 81 MG CHEWABLE TABLET    Chew 1 tablet (81 mg total) by mouth daily.   BUDESONIDE (PULMICORT) 0.25 MG/2ML NEBULIZER SOLUTION    Take 2 mLs (0.25 mg total) by nebulization 2 (two) times daily.   CARVEDILOL (COREG) 3.125 MG TABLET    Take 3.125 mg by mouth 2 (two) times daily with a meal.   DILTIAZEM (CARDIZEM CD) 300 MG 24 HR CAPSULE    Take 1 capsule (300 mg total) by mouth daily.   FEEDING SUPPLEMENT, ENSURE, (ENSURE) PUDG    Take 1 Container by mouth 3 (three) times daily between meals.   FLUTICASONE (FLONASE) 50 MCG/ACT NASAL SPRAY    Holland 2 sprays into the nose daily. 2 spray, Each Nare, Daily   FOOD THICKENER (THICK IT) POWD  As needed to make liquids nectar consistently   FUROSEMIDE (LASIX) 40 MG TABLET    Take 1 tablet (40 mg total) by mouth daily.   GUAIFENESIN (ROBITUSSIN) 100 MG/5ML SOLN    Take 5 mLs by mouth every 4 (four) hours as needed for cough or to loosen phlegm.   HYDROCODONE-ACETAMINOPHEN (NORCO) 10-325 MG PER TABLET    Take 1 tablet by mouth every 8 (eight) hours as needed for severe pain.   IPRATROPIUM (ATROVENT HFA) 17 MCG/ACT INHALER    Inhale 2 puffs into the lungs 4 (four) times daily.   LEVOTHYROXINE (SYNTHROID, LEVOTHROID) 25 MCG TABLET    Take 25 mcg by mouth daily before breakfast.   MOXIFLOXACIN (AVELOX) 400 MG TABLET    Take 1 tablet (400 mg total) by mouth daily at 8 pm. Take for 5 more days to finish abx therapy   NITROGLYCERIN (NITROSTAT) 0.4 MG SL TABLET    Hol[place] 0.4 mg under the tongue every 5 (five) minutes as needed for chest pain. x3 doses for for chest pain   POTASSIUM CHLORIDE SA  (K-DUR,KLOR-CON) 20 MEQ TABLET    Take 1 tablet (20 mEq total) by mouth daily.   RAPAFLO 8 MG CAPS    Take 8 mg by mouth daily.    SENNA-DOCUSATE (SENOKOT-S) 8.6-50 MG PER TABLET    Take 2 tablets by mouth at bedtime as needed for mild constipation.   WARFARIN (COUMADIN) 5 MG TABLET    Take 0.5-1 tablets (2.5-5 mg total) by mouth daily. Take 5mg  on M-W-F and then the other days take 2.5mg   Modified Medications   No medications on file  Discontinued Medications   No medications on file    SIGNIFICANT DIAGNOSTIC EXAMS   01-08-14: chest x-ray: No evidence of acute cardiopulmonary disease. Chronic interstitial markings/emphysematous changes with biapical pleural parenchymal scarring.  01-10-14: TEE: Left ventricle: The cavity size was normal. Systolic function was normal. The estimated ejection fraction was in the range of 50% to 55%. Wall motion was normal; there were no regional wall motion abnormalities. - Aortic valve: Trileaflet; normal thickness, mildly calcified leaflets.  01-20-14: chest x-ray: New right upper lobe infiltrate superimposed over chronic changes bilaterally.  01-21-14: ct of head: Volume loss and white matter changes as above. No definite CT evidence of acute intracranial abnormality.     LABS REVIEWED:   01-09-14: tsh 8.916; chol 185; ldl 117; trig 110 01-20-14: wbc 17.1; hgb 11.6; hct 33.0; mcv 87.4 plt  362; glucose 110; bun 29; creat 0.88; k+4.6; na++138; BNP 1528.0 01-21-14: wbc 15.3; hgb 10.7; hct 30.9; mcv 86.6; plt 386 ;glucose 109; bun 25; creat 0.80; k+4.2; na++138; liver normal albumin 2.3 01-25-14: wbc 13.4; hgb 11.1; hct 33.4; mcv 89.8; plt 465; glucose 83; bun 23; creat 0.89; k+3.9; na++139; mag 2.3     Review of Systems  Constitutional: Negative for malaise/fatigue.  Respiratory: Positive for cough and sputum production. Negative for shortness of breath and wheezing.        Is improving; is using flutter valve   Cardiovascular: Negative for chest pain,  palpitations and leg swelling.  Gastrointestinal: Negative for heartburn and constipation.  Musculoskeletal: Negative for joint pain and myalgias.  Skin: Negative.   Psychiatric/Behavioral: Negative for depression. The patient is not nervous/anxious.      Physical Exam  Constitutional: No distress.  frail  Neck: Neck supple. No JVD present.  Cardiovascular: Normal rate and intact distal pulses.   Heart rate irregular   Respiratory: Effort normal. No  respiratory distress. He has no wheezes.  Has diminished sounds throughout is using 02  GI: Soft. Bowel sounds are normal. He exhibits no distension. There is no tenderness.  Musculoskeletal: He exhibits no edema.  Is able to move all extremities.   Neurological: He is alert.  Skin: Skin is warm and dry. He is not diaphoretic.  Psychiatric: He has a normal mood and affect.      ASSESSMENT/ PLAN:  1. Afib/anticoagulation management: his heart rate is stable will continue amiodarone 200 mg twice daily. For his inr of 3.7 will hold coumadin today and will check inr in the am.

## 2014-01-28 NOTE — ED Provider Notes (Signed)
Medical screening examination/treatment/procedure(s) were conducted as a shared visit with non-physician practitioner(s) and myself.  I personally evaluated the patient during the encounter.   85yM with dyspnea. CXr concerning for pneumonia. Recent admit. abx for possible hcap. Admit.   Raeford RazorStephen Kaydince Towles, MD 01/28/14 2227

## 2014-01-31 ENCOUNTER — Encounter: Payer: Self-pay | Admitting: Internal Medicine

## 2014-01-31 ENCOUNTER — Non-Acute Institutional Stay (SKILLED_NURSING_FACILITY): Payer: Medicare Other | Admitting: Internal Medicine

## 2014-01-31 DIAGNOSIS — R5381 Other malaise: Secondary | ICD-10-CM

## 2014-01-31 DIAGNOSIS — J189 Pneumonia, unspecified organism: Secondary | ICD-10-CM

## 2014-01-31 DIAGNOSIS — I5032 Chronic diastolic (congestive) heart failure: Secondary | ICD-10-CM

## 2014-01-31 DIAGNOSIS — R531 Weakness: Secondary | ICD-10-CM

## 2014-01-31 DIAGNOSIS — R5383 Other fatigue: Secondary | ICD-10-CM

## 2014-01-31 DIAGNOSIS — R1310 Dysphagia, unspecified: Secondary | ICD-10-CM

## 2014-01-31 DIAGNOSIS — I4891 Unspecified atrial fibrillation: Secondary | ICD-10-CM

## 2014-01-31 DIAGNOSIS — N400 Enlarged prostate without lower urinary tract symptoms: Secondary | ICD-10-CM

## 2014-01-31 NOTE — Progress Notes (Signed)
Patient ID: [name], male   DOB: 1928/02/13, 80 y.o.   MRN: [id number]    [facility]    PCP: Tillman Abide, MD   Allergies  Allergen Reactions  . Fenofibrate Other (See Comments)    REACTION: "ran me up a wall"  . Penicillins Other (See Comments)    blisters  . Atorvastatin Other (See Comments)    REACTION: severe muscle aches  . Ezetimibe-Simvastatin Other (See Comments)    REACTION: severe muscle aches    Chief Complaint: new admit  HPI:  80 y/o male pt is here for STR after hospital admission from 01/20/14- 01/25/14 with acute respiratory failure in setting of pneumonia. He has dementia and also had acute toxic encephalopathy in setting of infection. He was found to be severely deconditioned and thus rehabilitation was recommended. He was seen in his room today. He mentions feeling weak with exertion. He feels fine at rest. Denies chest pain or dyspnea.   Review of Systems:  Constitutional: Negative for fever, chills, weight loss, diaphoresis.  HENT: Negative for congestion, hearing loss and sore throat.   Eyes: Negative for blurred vision, double vision and discharge.  Respiratory: Negative for cough, sputum production, shortness of breath and wheezing.   Cardiovascular: Negative for chest pain, palpitations, orthopnea and leg swelling.  Gastrointestinal: Negative for heartburn, nausea, vomiting, abdominal pain, diarrhea and constipation.  Genitourinary: Negative for dysuria, urgency, frequency and flank pain.  Musculoskeletal: Negative for back pain, falls, joint pain and myalgias.  Skin: Negative for itching and rash.  Neurological: Positive for weakness. Negative for dizziness, tingling, focal weakness and headaches.  Psychiatric/Behavioral: Negative for depression. Has memory loss. The patient is not nervous/anxious.     Past Medical History  Diagnosis Date  . CAD (coronary artery disease)   . Hyperlipidemia   . Kidney stones   . BPH (benign  prostatic hypertrophy)   . Osteoarthritis   . COPD (chronic obstructive pulmonary disease)   . Hypothyroidism   . Atrial fibrillation   . Impaired fasting glucose   . Myocardial infarction   . Hypertension   . Shortness of breath   . CHF (congestive heart failure)   . GERD (gastroesophageal reflux disease)    Past Surgical History  Procedure Laterality Date  . Inguinal hernia repair  09/2004    left  . Back surgery  1990's    x3  . Transurethral resection of prostate  2002  . Pleural scarification  1980    right  . Esophagogastroduodenoscopy  04/2002    negative  . Bladder stone removal  06/2006  . Colonoscopy Left 04/13/2013    Procedure: COLONOSCOPY;  Surgeon: Willis Modena, MD;  Location: [facility] ENDOSCOPY;  Service: Endoscopy;  Laterality: Left;   Social History:   reports that he quit smoking about 2 years ago. His smoking use included Cigarettes. He has a 60 pack-year smoking history. He has never used smokeless tobacco. He reports that he does not drink alcohol or use illicit drugs.  Family History  Problem Relation Age of Onset  . Heart disease Mother   . Heart disease Brother   . Stroke Brother   . Cancer Neg Hx   . Diabetes Neg Hx   . Hypertension Neg Hx   . Heart disease Brother   . Stroke Brother   . Heart disease Brother   . Stroke Brother     Medications: Patient's Medications  New Prescriptions   No medications on file  Previous Medications  ACETAMINOPHEN (TYLENOL) 325 MG TABLET    Take 2 tablets (650 mg total) by mouth every 6 (six) hours as needed for mild pain, fever or headache.   ALBUTEROL (PROVENTIL HFA;VENTOLIN HFA) 108 (90 BASE) MCG/ACT INHALER    Inhale 2 puffs into the lungs every 4 (four) hours as needed for wheezing or shortness of breath.   AMIODARONE (PACERONE) 200 MG TABLET    Take 1 tablet (200 mg total) by mouth 2 (two) times daily.   AMITRIPTYLINE (ELAVIL) 25 MG TABLET    Take 25 mg by mouth at bedtime.   ASPIRIN 81 MG CHEWABLE TABLET     Chew 1 tablet (81 mg total) by mouth daily.   BUDESONIDE (PULMICORT) 0.25 MG/2ML NEBULIZER SOLUTION    Take 2 mLs (0.25 mg total) by nebulization 2 (two) times daily.   CARVEDILOL (COREG) 3.125 MG TABLET    Take 3.125 mg by mouth 2 (two) times daily with a meal.   DILTIAZEM (CARDIZEM CD) 300 MG 24 HR CAPSULE    Take 1 capsule (300 mg total) by mouth daily.   FEEDING SUPPLEMENT, ENSURE, (ENSURE) PUDG    Take 1 Container by mouth 3 (three) times daily between meals.   FLUTICASONE (FLONASE) 50 MCG/ACT NASAL SPRAY    Place 2 sprays into the nose daily. 2 spray, Each Nare, Daily   FOOD THICKENER (THICK IT) POWD    As needed to make liquids nectar consistently   FUROSEMIDE (LASIX) 40 MG TABLET    Take 1 tablet (40 mg total) by mouth daily.   GUAIFENESIN (ROBITUSSIN) 100 MG/5ML SOLN    Take 5 mLs by mouth every 4 (four) hours as needed for cough or to loosen phlegm.   HYDROCODONE-ACETAMINOPHEN (NORCO) 10-325 MG PER TABLET    Take 1 tablet by mouth every 8 (eight) hours as needed for severe pain.   IPRATROPIUM (ATROVENT HFA) 17 MCG/ACT INHALER    Inhale 2 puffs into the lungs 4 (four) times daily.   LEVOTHYROXINE (SYNTHROID, LEVOTHROID) 25 MCG TABLET    Take 25 mcg by mouth daily before breakfast.   NITROGLYCERIN (NITROSTAT) 0.4 MG SL TABLET    Place 0.4 mg under the tongue every 5 (five) minutes as needed for chest pain. x3 doses for for chest pain   POTASSIUM CHLORIDE SA (K-DUR,KLOR-CON) 20 MEQ TABLET    Take 1 tablet (20 mEq total) by mouth daily.   RAPAFLO 8 MG CAPS    Take 8 mg by mouth daily.    SENNA-DOCUSATE (SENOKOT-S) 8.6-50 MG PER TABLET    Take 2 tablets by mouth at bedtime as needed for mild constipation.   WARFARIN (COUMADIN) 5 MG TABLET    Take 0.5-1 tablets (2.5-5 mg total) by mouth daily. Take 5mg  on M-W-F and then the other days take 2.5mg   Modified Medications   No medications on file  Discontinued Medications   No medications on file     Physical Exam:  Filed Vitals:    01/31/14 2301  BP: 108/60  Pulse: 63  Temp: 97.4 F (36.3 C)  Resp: 18  SpO2: 95%   General- elderly male in no acute distress Head- atraumatic, normocephalic Eyes- PERRLA, EOMI, no pallor, no icterus Neck- no lymphadenopathy Cardiovascular- normal s1,s2, no murmurs/ rubs/ gallops Respiratory- bilateral clear to auscultation, no wheeze, no rhonchi, no crackles Abdomen- bowel sounds present, soft, non tender Musculoskeletal- able to move all 4 extremities, weakness present Neurological- no focal deficit Psychiatry- alert and oriented to person, place and time, normal  mood and affect    Labs reviewed: Basic Metabolic Panel:  Recent Labs  81/19/14 0435  01/22/14 0239 01/23/14 0505 01/25/14 0445  NA 137  < > 139 141 139  K 4.1  < > 4.7 3.5* 3.9  CL 107  < > 101 104 100  CO2 22  < > 24 23 24   GLUCOSE 81  < > 94 126* 83  BUN 14  < > 28* 26* 23  CREATININE 0.90  < > 0.92 0.77 0.89  CALCIUM 9.3  < > 9.5 9.1 8.9  MG 2.0  --   --   --  2.3  < > = values in this interval not displayed. Liver Function Tests:  Recent Labs  02/22/13 0135 04/12/13 0535 01/21/14 0410  AST 18 27 29   ALT 15 27 27   ALKPHOS 93 105 106  BILITOT 0.4 0.6 0.6  PROT 7.3 6.7 6.8  ALBUMIN 3.5 3.2* 2.3*   No results found for this basename: LIPASE, AMYLASE,  in the last 8760 hours No results found for this basename: AMMONIA,  in the last 8760 hours CBC:  Recent Labs  03/11/13 1714  01/20/14 2159 01/20/14 2210 01/21/14 0410 01/25/14 0445  WBC 7.6  < > 17.1*  --  15.3* 13.4*  NEUTROABS 5.4  --  15.2*  --  13.6*  --   HGB 12.8*  < > 11.6* 12.2* 10.7* 11.1*  HCT 38.9*  < > 33.3* 36.0* 30.9* 33.4*  MCV 88.6  < > 87.4  --  86.6 89.8  PLT 237.0  < > 362  --  386 465*  < > = values in this interval not displayed. Cardiac Enzymes:  Recent Labs  04/08/13 1245 04/08/13 1731 04/09/13 0027  TROPONINI <0.30 <0.30 <0.30   BNP: No components found with this basename: POCBNP,  CBG:  Recent  Labs  04/14/13 1222  GLUCAP 74    Radiological Exams: Dg Chest 2 View  01/20/2014   CLINICAL DATA:  Shortness of breath  EXAM: CHEST  2 VIEW  COMPARISON:  01/08/2014  FINDINGS: Cardiac shadow is stable. New right upper lobe infiltrate is seen when compare with the prior exam. Diffuse fibrotic changes are again identified throughout both lungs. Changes of prior granulomatous disease are noted as well.  IMPRESSION: New right upper lobe infiltrate superimposed over chronic changes bilaterally.   Electronically Signed   By: Alcide Clever M.D.   On: 01/20/2014 21:19   Ct Head Wo Contrast  01/21/2014   CLINICAL DATA:  Altered level of consciousness  EXAM: CT HEAD WITHOUT CONTRAST  TECHNIQUE: Contiguous axial images were obtained from the base of the skull through the vertex without intravenous contrast.  COMPARISON:  None. FINDINGS: Prominence of the sulci, cisterns, and ventricles, in keeping with volume loss. Periventricular and subcortical white matter hypodensities are advanced and most in keeping with chronic microangiopathic change. No overt hydrocephalus. No definite CT evidence of an acute infarction. No intraparenchymal hemorrhage, mass, mass effect, or abnormal extra-axial fluid collection. The visualized paranasal sinuses and mastoid air cells are predominantly clear.  IMPRESSION: Volume loss and white matter changes as above. No definite CT evidence of acute intracranial abnormality.   Electronically Signed   By: Jearld Lesch M.D.   On: 01/21/2014 02:06   Dg Chest Portable 1 View  01/08/2014   CLINICAL DATA:  Shortness of breath, chronic cough, congestion  EXAM: PORTABLE CHEST - 1 VIEW  COMPARISON:  07/30/2013  FINDINGS: Chronic interstitial markings/ emphysematous changes.  Biapical pleural parenchymal scarring. No focal consolidation. No pleural effusion or pneumothorax.  Heart is normal in size.  IMPRESSION: No evidence of acute cardiopulmonary disease.  Chronic interstitial  markings/emphysematous changes with biapical pleural parenchymal scarring.   Electronically Signed   By: Charline Bills M.D.   On: 01/08/2014 22:26   Dg Swallowing Func-speech Pathology  01/21/2014   Pablo Lawrence, CCC-SLP     01/21/2014  4:30 PM Objective Swallowing Evaluation: Modified Barium Swallowing Study   Patient Details  Name: [name] MRN: [id number] Date of Birth: [birth year]  Today's Date: 01/21/2014 Time: 1120-1135 SLP Time Calculation (min): 15 min  Past Medical History:  Past Medical History  Diagnosis Date  . CAD (coronary artery disease)   . Hyperlipidemia   . Kidney stones   . BPH (benign prostatic hypertrophy)   . Osteoarthritis   . COPD (chronic obstructive pulmonary disease)   . Hypothyroidism   . Atrial fibrillation   . Impaired fasting glucose   . Myocardial infarction   . Hypertension   . Shortness of breath   . CHF (congestive heart failure)   . GERD (gastroesophageal reflux disease)    Past Surgical History:  Past Surgical History  Procedure Laterality Date  . Inguinal hernia repair  09/2004    left  . Back surgery  1990's    x3  . Transurethral resection of prostate  2002  . Pleural scarification  1980    right  . Esophagogastroduodenoscopy  04/2002    negative  . Bladder stone removal  06/2006  . Colonoscopy Left 04/13/2013    Procedure: COLONOSCOPY;  Surgeon: Willis Modena, MD;   Location: [facility] ENDOSCOPY;  Service: Endoscopy;  Laterality: Left;   HPI:  Patient is an 80 y.o. male with PMH: COPD, GERD, dyslipidemia,  atrial fibrillation, CAD, with four recent hospital admissions.  Patient had recently been admitted for atrial fibrillation and  diastolic dysfunction and dyspnea on exertion. Apparently, he has  not been eating, not taking medications appropriately, not  keeping his oxygen regularly, has a productive cough and  confusion.     Assessment / Plan / Recommendation Clinical Impression  Dysphagia Diagnosis: Moderate oral phase dysphagia;Severe  pharyngeal phase dysphagia  Clinical impression: Patient presents with a moderate oral and  moderate-severe pharyngeal phase dysphagia. Oral phase  characterized by decreased lingual ROM and strength, decreased  mastication and lingual manipulation. Pharyngeal phase  characterized by swallow initiation delays to vallecular sinus,  decreased hyolaryngeal excursion, poor epiglottic closure to  protect airway and moderate vallecular residuals remaining post  initial swallow secondary to pharyngeal weakness and poor  laryngeal elevation. Patient exhibited flash penetration with  nectar thick liquids and deep penetration which did not fully  clear with thin liquids.    Treatment Recommendation  Therapy as outlined in treatment plan below    Diet Recommendation Dysphagia 1 (Puree);Nectar-thick liquid   Liquid Administration via: Cup;No straw Medication Administration: Crushed with puree Supervision: Full supervision/cueing for compensatory strategies Compensations: Slow rate;Small sips/bites;Follow solids with  liquid;Multiple dry swallows after each bite/sip Postural Changes and/or Swallow Maneuvers: Seated upright 90  degrees;Upright 30-60 min after meal    Other  Recommendations Recommended Consults: MBS Oral Care Recommendations: Oral care BID Other Recommendations: Order thickener from pharmacy;Prohibited  food (jello, ice cream, thin soups);Remove water pitcher;Clarify  dietary restrictions   Follow Up Recommendations  24 hour supervision/assistance;Skilled Nursing facility    Frequency and Duration min 2x/week  2 weeks   Pertinent Vitals/Pain  SLP Swallow Goals  Patient will tolerate Dys 1 (puree), nectar thick liquids diet  with moderate cues to follow swallow precautions and no overt s/s  aspiration.   General Date of Onset: 01/20/14 HPI: Patient is an 80 y.o. male with PMH: COPD, GERD,  dyslipidemia, atrial fibrillation, CAD, with four recent hospital  admissions. Patient had recently been admitted for atrial  fibrillation and diastolic  dysfunction and dyspnea on exertion.  Apparently, he has not been eating, not taking medications  appropriately, not keeping his oxygen regularly, has a productive  cough and confusion. Type of Study: Modified Barium Swallowing Study Reason for Referral: Objectively evaluate swallowing function Previous Swallow Assessment: BSE Diet Prior to this Study: NPO Temperature Spikes Noted: No Respiratory Status: Nasal cannula History of Recent Intubation: No Behavior/Cognition: Alert;Cooperative;Pleasant mood;Requires  cueing;Confused Oral Cavity - Dentition: Adequate natural dentition Self-Feeding Abilities: Needs assist Patient Positioning: Upright in chair Baseline Vocal Quality: Low vocal intensity;Wet Volitional Cough: Weak Volitional Swallow: Unable to elicit Pharyngeal Secretions: Not observed secondary MBS    Reason for Referral Objectively evaluate swallowing function   Oral Phase Oral Preparation/Oral Phase Oral Phase: Impaired Oral - Solids Oral - Puree: Weak lingual manipulation;Lingual pumping;Reduced  posterior propulsion Oral - Regular: Impaired mastication;Reduced posterior  propulsion;Weak lingual manipulation   Pharyngeal Phase Pharyngeal Phase Pharyngeal Phase: Impaired Pharyngeal - Honey Pharyngeal - Honey Teaspoon: Premature spillage to  valleculae;Delayed swallow initiation;Reduced tongue base  retraction;Reduced airway/laryngeal closure;Pharyngeal residue -  valleculae Pharyngeal - Nectar Pharyngeal - Nectar Cup: Delayed swallow initiation;Premature  spillage to valleculae;Pharyngeal residue - valleculae;Reduced  tongue base retraction;Reduced airway/laryngeal  closure;Penetration/Aspiration during swallow Penetration/Aspiration details (nectar cup): Material enters  airway, remains ABOVE vocal cords then ejected out Pharyngeal - Thin Pharyngeal - Thin Cup: Premature spillage to  valleculae;Pharyngeal residue - valleculae;Penetration/Aspiration  after swallow;Reduced airway/laryngeal closure  Penetration/Aspiration details (thin cup): Material enters  airway, CONTACTS cords and not ejected out Pharyngeal - Solids Pharyngeal - Puree: Delayed swallow initiation;Reduced laryngeal  elevation;Reduced tongue base retraction;Pharyngeal residue -  valleculae Pharyngeal - Regular: Delayed swallow initiation;Premature  spillage to valleculae;Pharyngeal residue - valleculae;Reduced  tongue base retraction  Cervical Esophageal Phase    GO    Cervical Esophageal Phase Cervical Esophageal Phase: Leonarda SalonWFL         Pablo LawrencePreston, John [name] 01/21/2014, 4:29 PM  Angela NevinJohn T. Preston, MA, CCC-SLP [facility] Speech-Language Pathologist      Assessment/Plan  Generalized weakness- in setting of deconditioning with pneumonia, protein calorie malnutrtion, dysphagia and chronic medical issues. Will have him work with PT and OT. Fall precautions. Skin care and pressure ulcer prophylaxis. Rule out orthostatic hypotension. Encourage po intake with hydration  Pneumonia- complete course of avelox, continue bronchodilator treatment and prn oxygen. F/u cxr in few weeks to assess for resolution of his pneumonia  Dysphagia- continue dysphagia diet and aspiration precautions. advance and adjust as needed.   Atrial fibrillation- rate controlled, continue amiodarone, diltiazem, coreg and coumadin.  inr today 4.7. Will hold his coumadin and check inr in 2 days. Goal inr 2-3  chronic diastolic CHF- stable. Continue lasix and coreg, monitor weight  BPH- continue silodosin for now. Monitor  Family/ staff Communication: reviewed care plan with patient and nursing supervisor   Goals of care: STR   Labs/tests ordered: cbc, cmp, orthostatic bp

## 2014-02-01 LAB — CBC AND DIFFERENTIAL
HCT: 33 % — AB (ref 41–53)
HEMOGLOBIN: 9.9 g/dL — AB (ref 13.5–17.5)
Platelets: 388 10*3/uL (ref 150–399)
WBC: 9.3 10*3/mL

## 2014-02-01 LAB — BASIC METABOLIC PANEL
BUN: 15 mg/dL (ref 4–21)
Creatinine: 0.9 mg/dL (ref 0.6–1.3)
Glucose: 75 mg/dL
POTASSIUM: 4.2 mmol/L (ref 3.4–5.3)
Sodium: 137 mmol/L (ref 137–147)

## 2014-02-02 ENCOUNTER — Non-Acute Institutional Stay (SKILLED_NURSING_FACILITY): Payer: Medicare Other | Admitting: Adult Health

## 2014-02-02 ENCOUNTER — Encounter: Payer: Self-pay | Admitting: Adult Health

## 2014-02-02 DIAGNOSIS — Z7901 Long term (current) use of anticoagulants: Secondary | ICD-10-CM

## 2014-02-02 DIAGNOSIS — I4891 Unspecified atrial fibrillation: Secondary | ICD-10-CM

## 2014-02-02 NOTE — Progress Notes (Signed)
Patient ID: [name], male   DOB: 1928/09/30, 80 y.o.   MRN: [id number]     [facility]  Allergies  Allergen Reactions  . Fenofibrate Other (See Comments)    REACTION: "ran me up a wall"  . Penicillins Other (See Comments)    blisters  . Atorvastatin Other (See Comments)    REACTION: severe muscle aches  . Ezetimibe-Simvastatin Other (See Comments)    REACTION: severe muscle aches     Chief Complaint  Patient presents with  . Acute Visit    coumadin management     HPI:  His inr today is 3.1; his 4 mg coumadin dose has been on hold for 2 days. There are no concerns being voiced at this time. He is not voicing concerns at this time.    Past Medical History  Diagnosis Date  . CAD (coronary artery disease)   . Hyperlipidemia   . Kidney stones   . BPH (benign prostatic hypertrophy)   . Osteoarthritis   . COPD (chronic obstructive pulmonary disease)   . Hypothyroidism   . Atrial fibrillation   . Impaired fasting glucose   . Myocardial infarction   . Hypertension   . Shortness of breath   . CHF (congestive heart failure)   . GERD (gastroesophageal reflux disease)     Past Surgical History  Procedure Laterality Date  . Inguinal hernia repair  09/2004    left  . Back surgery  1990's    x3  . Transurethral resection of prostate  2002  . Pleural scarification  1980    right  . Esophagogastroduodenoscopy  04/2002    negative  . Bladder stone removal  06/2006  . Colonoscopy Left 04/13/2013    Procedure: COLONOSCOPY;  Surgeon: Willis Modena, MD;  Location: [facility] ENDOSCOPY;  Service: Endoscopy;  Laterality: Left;    VITAL SIGNS BP 136/77  Pulse 64  Ht 5\' 7"  (1.702 m)  Wt 109 lb (49.442 kg)  BMI 17.07 kg/m2   Patient's Medications  New Prescriptions   No medications on file  Previous Medications   ACETAMINOPHEN (TYLENOL) 325 MG TABLET    Take 2 tablets (650 mg total) by mouth every 6 (six) hours as needed for mild pain, fever or headache.   ALBUTEROL  (PROVENTIL HFA;VENTOLIN HFA) 108 (90 BASE) MCG/ACT INHALER    Inhale 2 puffs into the lungs every 4 (four) hours as needed for wheezing or shortness of breath.   AMIODARONE (PACERONE) 200 MG TABLET    Take 1 tablet (200 mg total) by mouth 2 (two) times daily.   AMITRIPTYLINE (ELAVIL) 25 MG TABLET    Take 25 mg by mouth at bedtime.   ASPIRIN 81 MG CHEWABLE TABLET    Chew 1 tablet (81 mg total) by mouth daily.   BUDESONIDE (PULMICORT) 0.25 MG/2ML NEBULIZER SOLUTION    Take 2 mLs (0.25 mg total) by nebulization 2 (two) times daily.   CARVEDILOL (COREG) 3.125 MG TABLET    Take 3.125 mg by mouth 2 (two) times daily with a meal.   DILTIAZEM (CARDIZEM CD) 300 MG 24 HR CAPSULE    Take 1 capsule (300 mg total) by mouth daily.   FEEDING SUPPLEMENT, ENSURE, (ENSURE) PUDG    Take 1 Container by mouth 3 (three) times daily between meals.   FLUTICASONE (FLONASE) 50 MCG/ACT NASAL SPRAY    Place 2 sprays into the nose daily. 2 spray, Each Nare, Daily   FOOD THICKENER (THICK IT) POWD    As needed  to make liquids nectar consistently   FUROSEMIDE (LASIX) 40 MG TABLET    Take 1 tablet (40 mg total) by mouth daily.   GUAIFENESIN (ROBITUSSIN) 100 MG/5ML SOLN    Take 5 mLs by mouth every 4 (four) hours as needed for cough or to loosen phlegm.   HYDROCODONE-ACETAMINOPHEN (NORCO) 10-325 MG PER TABLET    Take 1 tablet by mouth every 8 (eight) hours as needed for severe pain.   IPRATROPIUM (ATROVENT HFA) 17 MCG/ACT INHALER    Inhale 2 puffs into the lungs 4 (four) times daily.   LEVOTHYROXINE (SYNTHROID, LEVOTHROID) 25 MCG TABLET    Take 25 mcg by mouth daily before breakfast.   NITROGLYCERIN (NITROSTAT) 0.4 MG SL TABLET    Place 0.4 mg under the tongue every 5 (five) minutes as needed for chest pain. x3 doses for for chest pain   POTASSIUM CHLORIDE SA (K-DUR,KLOR-CON) 20 MEQ TABLET    Take 1 tablet (20 mEq total) by mouth daily.   RAPAFLO 8 MG CAPS    Take 8 mg by mouth daily.    SENNA-DOCUSATE (SENOKOT-S) 8.6-50 MG PER  TABLET    Take 2 tablets by mouth at bedtime as needed for mild constipation.  Modified Medications   Modified Medication Previous Medication   WARFARIN (COUMADIN) 5 MG TABLET warfarin (COUMADIN) 5 MG tablet      Take 4 mg by mouth daily.    Take 0.5-1 tablets (2.5-5 mg total) by mouth daily. Take 5mg  on M-W-F and then the other days take 2.5mg   Discontinued Medications   No medications on file    SIGNIFICANT DIAGNOSTIC EXAMS  01-08-14: chest x-ray: No evidence of acute cardiopulmonary disease. Chronic interstitial markings/emphysematous changes with biapical pleural parenchymal scarring.  01-10-14: TEE: Left ventricle: The cavity size was normal. Systolic function was normal. The estimated ejection fraction was in the range of 50% to 55%. Wall motion was normal; there were no regional wall motion abnormalities. - Aortic valve: Trileaflet; normal thickness, mildly calcified leaflets.  01-20-14: chest x-ray: New right upper lobe infiltrate superimposed over chronic changes bilaterally.  01-21-14: ct of head: Volume loss and white matter changes as above. No definite CT evidence of acute intracranial abnormality.     LABS REVIEWED:   01-09-14: tsh 8.916; chol 185; ldl 117; trig 110 01-20-14: wbc 17.1; hgb 11.6; hct 33.0; mcv 87.4 plt  362; glucose 110; bun 29; creat 0.88; k+4.6; na++138; BNP 1528.0 01-21-14: wbc 15.3; hgb 10.7; hct 30.9; mcv 86.6; plt 386 ;glucose 109; bun 25; creat 0.80; k+4.2; na++138; liver normal albumin 2.3 01-25-14: wbc 13.4; hgb 11.1; hct 33.4; mcv 89.8; plt 465; glucose 83; bun 23; creat 0.89; k+3.9; na++139; mag 2.3     Review of Systems  Constitutional: Negative for malaise/fatigue.  Respiratory: negative for cough . Negative for shortness of breath and wheezing.    Cardiovascular: Negative for chest pain, palpitations and leg swelling.  Gastrointestinal: Negative for heartburn and constipation.  Musculoskeletal: Negative for joint pain and myalgias.  Skin:  Negative.   Psychiatric/Behavioral: Negative for depression. The patient is not nervous/anxious.      Physical Exam  Constitutional: No distress.  frail  Neck: Neck supple. No JVD present.  Cardiovascular: Normal rate and intact distal pulses.   Heart rate irregular   Respiratory: Effort normal. No respiratory distress. He has no wheezes.  Has diminished sounds throughout is using 02  GI: Soft. Bowel sounds are normal. He exhibits no distension. There is no tenderness.  Musculoskeletal: He  exhibits no edema.  Is able to move all extremities.   Neurological: He is alert.  Skin: Skin is warm and dry. He is not diaphoretic.  Psychiatric: He has a normal mood and affect.     ASSESSMENT/ PLAN:  1. Afib/anticoagulation management: for inr 3.1 will begin coumadin 4 mg daily and check inr on Friday. Will monitor

## 2014-02-04 ENCOUNTER — Ambulatory Visit: Payer: Medicare Other | Admitting: Internal Medicine

## 2014-02-04 ENCOUNTER — Non-Acute Institutional Stay (SKILLED_NURSING_FACILITY): Payer: Medicare Other | Admitting: Adult Health

## 2014-02-04 DIAGNOSIS — I4891 Unspecified atrial fibrillation: Secondary | ICD-10-CM

## 2014-02-04 DIAGNOSIS — Z7901 Long term (current) use of anticoagulants: Secondary | ICD-10-CM

## 2014-02-07 ENCOUNTER — Encounter: Payer: Self-pay | Admitting: Adult Health

## 2014-02-07 ENCOUNTER — Non-Acute Institutional Stay (SKILLED_NURSING_FACILITY): Payer: Medicare Other | Admitting: Adult Health

## 2014-02-07 DIAGNOSIS — Z7901 Long term (current) use of anticoagulants: Secondary | ICD-10-CM

## 2014-02-07 DIAGNOSIS — I4891 Unspecified atrial fibrillation: Secondary | ICD-10-CM

## 2014-02-07 NOTE — Progress Notes (Signed)
Patient ID: [name], male   DOB: 06-29-1928, 80 y.o.   MRN: [id number]     [facility]  Allergies  Allergen Reactions  . Fenofibrate Other (See Comments)    REACTION: "ran me up a wall"  . Penicillins Other (See Comments)    blisters  . Atorvastatin Other (See Comments)    REACTION: severe muscle aches  . Ezetimibe-Simvastatin Other (See Comments)    REACTION: severe muscle aches     Chief Complaint  Patient presents with  . Acute Visit    coumadin management     HPI:  He is on long term coumadin therapy for his afib. His hear rate is under control. His inr today is 3.0 and he is taking coumadin 4 mg daily. There are no concerns being voiced by the nursing staff at this time. He is not voicing any concerns today.   Past Medical History  Diagnosis Date  . CAD (coronary artery disease)   . Hyperlipidemia   . Kidney stones   . BPH (benign prostatic hypertrophy)   . Osteoarthritis   . COPD (chronic obstructive pulmonary disease)   . Hypothyroidism   . Atrial fibrillation   . Impaired fasting glucose   . Myocardial infarction   . Hypertension   . Shortness of breath   . CHF (congestive heart failure)   . GERD (gastroesophageal reflux disease)     Past Surgical History  Procedure Laterality Date  . Inguinal hernia repair  09/2004    left  . Back surgery  1990's    x3  . Transurethral resection of prostate  2002  . Pleural scarification  1980    right  . Esophagogastroduodenoscopy  04/2002    negative  . Bladder stone removal  06/2006  . Colonoscopy Left 04/13/2013    Procedure: COLONOSCOPY;  Surgeon: Willis Modena, MD;  Location: [facility] ENDOSCOPY;  Service: Endoscopy;  Laterality: Left;    VITAL SIGNS BP 119/57  Pulse 79  Ht 5\' 7"  (1.702 m)  Wt 109 lb (49.442 kg)  BMI 17.07 kg/m2   Patient's Medications  New Prescriptions   No medications on file  Previous Medications   ACETAMINOPHEN (TYLENOL) 325 MG TABLET    Take 2 tablets (650 mg total) by  mouth every 6 (six) hours as needed for mild pain, fever or headache.   ALBUTEROL (PROVENTIL HFA;VENTOLIN HFA) 108 (90 BASE) MCG/ACT INHALER    Inhale 2 puffs into the lungs every 4 (four) hours as needed for wheezing or shortness of breath.   AMIODARONE (PACERONE) 200 MG TABLET    Take 1 tablet (200 mg total) by mouth 2 (two) times daily.   AMITRIPTYLINE (ELAVIL) 25 MG TABLET    Take 25 mg by mouth at bedtime.   ASPIRIN 81 MG CHEWABLE TABLET    Chew 1 tablet (81 mg total) by mouth daily.   BUDESONIDE (PULMICORT) 0.25 MG/2ML NEBULIZER SOLUTION    Take 2 mLs (0.25 mg total) by nebulization 2 (two) times daily.   CARVEDILOL (COREG) 3.125 MG TABLET    Take 3.125 mg by mouth 2 (two) times daily with a meal.   DILTIAZEM (CARDIZEM CD) 300 MG 24 HR CAPSULE    Take 1 capsule (300 mg total) by mouth daily.   FEEDING SUPPLEMENT, ENSURE, (ENSURE) PUDG    Take 1 Container by mouth 3 (three) times daily between meals.   FLUTICASONE (FLONASE) 50 MCG/ACT NASAL SPRAY    Place 2 sprays into the nose daily. 2 spray, Each  Nare, Daily   FOOD THICKENER (THICK IT) POWD    As needed to make liquids nectar consistently   FUROSEMIDE (LASIX) 40 MG TABLET    Take 1 tablet (40 mg total) by mouth daily.   GUAIFENESIN (ROBITUSSIN) 100 MG/5ML SOLN    Take 5 mLs by mouth every 4 (four) hours as needed for cough or to loosen phlegm.   HYDROCODONE-ACETAMINOPHEN (NORCO) 10-325 MG PER TABLET    Take 1 tablet by mouth every 8 (eight) hours as needed for severe pain.   IPRATROPIUM (ATROVENT HFA) 17 MCG/ACT INHALER    Inhale 2 puffs into the lungs 4 (four) times daily.   LEVOTHYROXINE (SYNTHROID, LEVOTHROID) 25 MCG TABLET    Take 25 mcg by mouth daily before breakfast.   NITROGLYCERIN (NITROSTAT) 0.4 MG SL TABLET    Place 0.4 mg under the tongue every 5 (five) minutes as needed for chest pain. x3 doses for for chest pain   POTASSIUM CHLORIDE SA (K-DUR,KLOR-CON) 20 MEQ TABLET    Take 1 tablet (20 mEq total) by mouth daily.   RAPAFLO 8 MG  CAPS    Take 8 mg by mouth daily.    SENNA-DOCUSATE (SENOKOT-S) 8.6-50 MG PER TABLET    Take 2 tablets by mouth at bedtime as needed for mild constipation.   WARFARIN (COUMADIN) 5 MG TABLET    Take 4 mg by mouth daily.  Modified Medications   No medications on file  Discontinued Medications   No medications on file    SIGNIFICANT DIAGNOSTIC EXAMS  01-08-14: chest x-ray: No evidence of acute cardiopulmonary disease. Chronic interstitial markings/emphysematous changes with biapical pleural parenchymal scarring.  01-10-14: TEE: Left ventricle: The cavity size was normal. Systolic function was normal. The estimated ejection fraction was in the range of 50% to 55%. Wall motion was normal; there were no regional wall motion abnormalities. - Aortic valve: Trileaflet; normal thickness, mildly calcified leaflets.  01-20-14: chest x-ray: New right upper lobe infiltrate superimposed over chronic changes bilaterally.  01-21-14: ct of head: Volume loss and white matter changes as above. No definite CT evidence of acute intracranial abnormality.     LABS REVIEWED:   01-09-14: tsh 8.916; chol 185; ldl 117; trig 110 01-20-14: wbc 17.1; hgb 11.6; hct 33.0; mcv 87.4 plt  362; glucose 110; bun 29; creat 0.88; k+4.6; na++138; BNP 1528.0 01-21-14: wbc 15.3; hgb 10.7; hct 30.9; mcv 86.6; plt 386 ;glucose 109; bun 25; creat 0.80; k+4.2; na++138; liver normal albumin 2.3 01-25-14: wbc 13.4; hgb 11.1; hct 33.4; mcv 89.8; plt 465; glucose 83; bun 23; creat 0.89; k+3.9; na++139; mag 2.3     Review of Systems  Constitutional: Negative for malaise/fatigue.  Respiratory: negative for cough . Negative for shortness of breath and wheezing.    Cardiovascular: Negative for chest pain, palpitations and leg swelling.  Gastrointestinal: Negative for heartburn and constipation.  Musculoskeletal: Negative for joint pain and myalgias.  Skin: Negative.   Psychiatric/Behavioral: Negative for depression. The patient is not  nervous/anxious.      Physical Exam  Constitutional: No distress.  frail  Neck: Neck supple. No JVD present.  Cardiovascular: Normal rate and intact distal pulses.   Heart rate irregular   Respiratory: Effort normal. No respiratory distress. He has no wheezes.  Has diminished sounds throughout is using 02  GI: Soft. Bowel sounds are normal. He exhibits no distension. There is no tenderness.  Musculoskeletal: He exhibits no edema.  Is able to move all extremities.   Neurological: He is alert.  Skin: Skin is warm and dry. He is not diaphoretic.  Psychiatric: He has a normal mood and affect.     ASSESSMENT/ PLAN:  1. Afib/anticoagulation management: for inr 3.0 will continue  coumadin 4 mg daily and check inr on Monday . Will monitor       Synthia Innocenteborah Garris Melhorn NP [facility]  Contact [phone] Monday through Friday 8am- 5pm  After hours call [phone]

## 2014-02-07 NOTE — Progress Notes (Signed)
Patient ID: [name] GowdaDanah Holland, male   DOB: September 25, 1928, 80 y.o.   MRN: [id number]     [facility]  Allergies  Allergen Reactions  . Fenofibrate Other (See Comments)    REACTION: "ran me up a wall"  . Penicillins Other (See Comments)    blisters  . Atorvastatin Other (See Comments)    REACTION: severe muscle aches  . Ezetimibe-Simvastatin Other (See Comments)    REACTION: severe muscle aches     Chief Complaint  Patient presents with  . Acute Visit    coumadin management     HPI:  He is on long term coumadin therapy for his afib. His heart rate is under control. His inr today is 6.8 and he is currently taking coumadin 4 mg daily. He is not voicing any complaints today. The nursing understands to monitor him closely for signs of bleeding.   Past Medical History  Diagnosis Date  . CAD (coronary artery disease)   . Hyperlipidemia   . Kidney stones   . BPH (benign prostatic hypertrophy)   . Osteoarthritis   . COPD (chronic obstructive pulmonary disease)   . Hypothyroidism   . Atrial fibrillation   . Impaired fasting glucose   . Myocardial infarction   . Hypertension   . Shortness of breath   . CHF (congestive heart failure)   . GERD (gastroesophageal reflux disease)     Past Surgical History  Procedure Laterality Date  . Inguinal hernia repair  09/2004    left  . Back surgery  1990's    x3  . Transurethral resection of prostate  2002  . Pleural scarification  1980    right  . Esophagogastroduodenoscopy  04/2002    negative  . Bladder stone removal  06/2006  . Colonoscopy Left 04/13/2013    Procedure: COLONOSCOPY;  Surgeon: Willis ModenaWilliam Outlaw, MD;  [facility];  Service: Endoscopy;  Laterality: Left;    VITAL SIGNS BP 107/67  Pulse 69  Ht 5\' 7"  (1.702 m)  Wt 109 lb (49.442 kg)  BMI 17.07 kg/m2   Patient's Medications  New Prescriptions   No medications on file  Previous Medications   ACETAMINOPHEN (TYLENOL) 325 MG TABLET    Take 2 tablets (650 mg  total) by mouth every 6 (six) hours as needed for mild pain, fever or headache.   ALBUTEROL (PROVENTIL HFA;VENTOLIN HFA) 108 (90 BASE) MCG/ACT INHALER    Inhale 2 puffs into the lungs every 4 (four) hours as needed for wheezing or shortness of breath.   AMIODARONE (PACERONE) 200 MG TABLET    Take 1 tablet (200 mg total) by mouth 2 (two) times daily.   AMITRIPTYLINE (ELAVIL) 25 MG TABLET    Take 25 mg by mouth at bedtime.   ASPIRIN 81 MG CHEWABLE TABLET    Chew 1 tablet (81 mg total) by mouth daily.   BUDESONIDE (PULMICORT) 0.25 MG/2ML NEBULIZER SOLUTION    Take 2 mLs (0.25 mg total) by nebulization 2 (two) times daily.   CARVEDILOL (COREG) 3.125 MG TABLET    Take 3.125 mg by mouth 2 (two) times daily with a meal.   DILTIAZEM (CARDIZEM CD) 300 MG 24 HR CAPSULE    Take 1 capsule (300 mg total) by mouth daily.   FEEDING SUPPLEMENT, ENSURE, (ENSURE) PUDG    Take 1 Container by mouth 3 (three) times daily between meals.   FLUTICASONE (FLONASE) 50 MCG/ACT NASAL SPRAY    Place 2 sprays into the nose daily. 2 spray, Each Nare,  Daily   FOOD THICKENER (THICK IT) POWD    As needed to make liquids nectar consistently   FUROSEMIDE (LASIX) 40 MG TABLET    Take 1 tablet (40 mg total) by mouth daily.   GUAIFENESIN (ROBITUSSIN) 100 MG/5ML SOLN    Take 5 mLs by mouth every 4 (four) hours as needed for cough or to loosen phlegm.   HYDROCODONE-ACETAMINOPHEN (NORCO) 10-325 MG PER TABLET    Take 1 tablet by mouth every 8 (eight) hours as needed for severe pain.   IPRATROPIUM (ATROVENT HFA) 17 MCG/ACT INHALER    Inhale 2 puffs into the lungs 4 (four) times daily.   LEVOTHYROXINE (SYNTHROID, LEVOTHROID) 25 MCG TABLET    Take 25 mcg by mouth daily before breakfast.   NITROGLYCERIN (NITROSTAT) 0.4 MG SL TABLET    Place 0.4 mg under the tongue every 5 (five) minutes as needed for chest pain. x3 doses for for chest pain   POTASSIUM CHLORIDE SA (K-DUR,KLOR-CON) 20 MEQ TABLET    Take 1 tablet (20 mEq total) by mouth daily.    RAPAFLO 8 MG CAPS    Take 8 mg by mouth daily.    SENNA-DOCUSATE (SENOKOT-S) 8.6-50 MG PER TABLET    Take 2 tablets by mouth at bedtime as needed for mild constipation.   WARFARIN (COUMADIN) 5 MG TABLET    Take 4 mg by mouth daily.  Modified Medications   No medications on file  Discontinued Medications   No medications on file    SIGNIFICANT DIAGNOSTIC EXAMS  01-08-14: chest x-ray: No evidence of acute cardiopulmonary disease. Chronic interstitial markings/emphysematous changes with biapical pleural parenchymal scarring.  01-10-14: TEE: Left ventricle: The cavity size was normal. Systolic function was normal. The estimated ejection fraction was in the range of 50% to 55%. Wall motion was normal; there were no regional wall motion abnormalities. - Aortic valve: Trileaflet; normal thickness, mildly calcified leaflets.  01-20-14: chest x-ray: New right upper lobe infiltrate superimposed over chronic changes bilaterally.  01-21-14: ct of head: Volume loss and white matter changes as above. No definite CT evidence of acute intracranial abnormality.     LABS REVIEWED:   01-09-14: tsh 8.916; chol 185; ldl 117; trig 110 01-20-14: wbc 17.1; hgb 11.6; hct 33.0; mcv 87.4 plt  362; glucose 110; bun 29; creat 0.88; k+4.6; na++138; BNP 1528.0 01-21-14: wbc 15.3; hgb 10.7; hct 30.9; mcv 86.6; plt 386 ;glucose 109; bun 25; creat 0.80; k+4.2; na++138; liver normal albumin 2.3 01-25-14: wbc 13.4; hgb 11.1; hct 33.4; mcv 89.8; plt 465; glucose 83; bun 23; creat 0.89; k+3.9; na++139; mag 2.3     Review of Systems  Constitutional: Negative for malaise/fatigue.  Respiratory: negative for cough . Negative for shortness of breath and wheezing.    Cardiovascular: Negative for chest pain, palpitations and leg swelling.  Gastrointestinal: Negative for heartburn and constipation.  Musculoskeletal: Negative for joint pain and myalgias.  Skin: Negative.   Psychiatric/Behavioral: Negative for depression. The patient  is not nervous/anxious.      Physical Exam  Constitutional: No distress.  frail  Neck: Neck supple. No JVD present.  Cardiovascular: Normal rate and intact distal pulses.   Heart rate irregular   Respiratory: Effort normal. No respiratory distress. He has no wheezes.  Has diminished sounds throughout  GI: Soft. Bowel sounds are normal. He exhibits no distension. There is no tenderness.  Musculoskeletal: He exhibits no edema.  Is able to move all extremities.   Neurological: He is alert.  Skin: Skin is  warm and dry. He is not diaphoretic.  Psychiatric: He has a normal mood and affect.     ASSESSMENT/ PLAN:  1. Afib/anticoagulation management: for his inr of 6.8 will hold his coumadin 4 mg for 2 doses will then recheck inr and will monitor his status.       Synthia Innocent NP [facility]  Contact [phone] Monday through Friday 8am- 5pm  After hours call [phone]

## 2014-02-08 ENCOUNTER — Ambulatory Visit: Payer: Medicare Other | Admitting: Internal Medicine

## 2014-02-09 ENCOUNTER — Encounter: Payer: Self-pay | Admitting: Internal Medicine

## 2014-02-10 ENCOUNTER — Encounter: Payer: Self-pay | Admitting: Adult Health

## 2014-02-10 ENCOUNTER — Non-Acute Institutional Stay (SKILLED_NURSING_FACILITY): Payer: Medicare Other | Admitting: Adult Health

## 2014-02-10 DIAGNOSIS — I4891 Unspecified atrial fibrillation: Secondary | ICD-10-CM

## 2014-02-10 DIAGNOSIS — Z7901 Long term (current) use of anticoagulants: Secondary | ICD-10-CM

## 2014-02-10 NOTE — Progress Notes (Signed)
Patient ID: [name], male   DOB: [birth year], 80 y.o.   MRN: [id number]     [facility]  Allergies  Allergen Reactions  . Fenofibrate Other (See Comments)    REACTION: "ran me up a wall"  . Penicillins Other (See Comments)    blisters  . Atorvastatin Other (See Comments)    REACTION: severe muscle aches  . Ezetimibe-Simvastatin Other (See Comments)    REACTION: severe muscle aches     Chief Complaint  Patient presents with  . Acute Visit    coumadin management     HPI:   He is on long term coumadin therapy for his afib. His heart rate is under control. His inr today is 2.1  and he is current dose of  coumadin 4 mg is on hold.  He is not voicing any complaints today. The nursing understands to monitor him closely for signs of bleeding.     Past Medical History  Diagnosis Date  . CAD (coronary artery disease)   . Hyperlipidemia   . Kidney stones   . BPH (benign prostatic hypertrophy)   . Osteoarthritis   . COPD (chronic obstructive pulmonary disease)   . Hypothyroidism   . Atrial fibrillation   . Impaired fasting glucose   . Myocardial infarction   . Hypertension   . Shortness of breath   . CHF (congestive heart failure)   . GERD (gastroesophageal reflux disease)     Past Surgical History  Procedure Laterality Date  . Inguinal hernia repair  09/2004    left  . Back surgery  1990's    x3  . Transurethral resection of prostate  2002  . Pleural scarification  1980    right  . Esophagogastroduodenoscopy  04/2002    negative  . Bladder stone removal  06/2006  . Colonoscopy Left 04/13/2013    Procedure: COLONOSCOPY;  Surgeon: Willis Modena, MD;  Location: [facility] ENDOSCOPY;  Service: Endoscopy;  Laterality: Left;    VITAL SIGNS BP 117/79  Pulse 80  Ht 5\' 7"  (1.702 m)  Wt 109 lb (49.442 kg)  BMI 17.07 kg/m2   Patient's Medications  New Prescriptions   No medications on file  Previous Medications   ACETAMINOPHEN (TYLENOL) 325 MG TABLET    Take 2  tablets (650 mg total) by mouth every 6 (six) hours as needed for mild pain, fever or headache.   ALBUTEROL (PROAIR HFA) 108 (90 BASE) MCG/ACT INHALER    Inhale 2 puffs into the lungs every 4 (four) hours as needed for wheezing or shortness of breath.   AMIODARONE (PACERONE) 200 MG TABLET    Take 1 tablet (200 mg total) by mouth 2 (two) times daily.   AMITRIPTYLINE (ELAVIL) 25 MG TABLET    Take 25 mg by mouth at bedtime.   ASPIRIN 81 MG CHEWABLE TABLET    Chew 1 tablet (81 mg total) by mouth daily.   BUDESONIDE (PULMICORT) 0.25 MG/2ML NEBULIZER SOLUTION    Take 2 mLs (0.25 mg total) by nebulization 2 (two) times daily.   CARVEDILOL (COREG) 3.125 MG TABLET    Take 3.125 mg by mouth 2 (two) times daily with a meal.   DILTIAZEM (CARDIZEM CD) 300 MG 24 HR CAPSULE    Take 1 capsule (300 mg total) by mouth daily.   FEEDING SUPPLEMENT, ENSURE, (ENSURE) PUDG    Take 1 Container by mouth 3 (three) times daily between meals.   FLUTICASONE (FLONASE) 50 MCG/ACT NASAL SPRAY    Place 2 sprays  into the nose daily. 2 spray, Each Nare, Daily   FOOD THICKENER (THICK IT) POWD    As needed to make liquids nectar consistently   FUROSEMIDE (LASIX) 40 MG TABLET    Take 1 tablet (40 mg total) by mouth daily.   GUAIFENESIN (ROBITUSSIN) 100 MG/5ML SOLN    Take 5 mLs by mouth every 4 (four) hours as needed for cough or to loosen phlegm.   HYDROCODONE-ACETAMINOPHEN (NORCO) 10-325 MG PER TABLET    Take 1 tablet by mouth every 8 (eight) hours as needed for severe pain.   IPRATROPIUM (ATROVENT HFA) 17 MCG/ACT INHALER    Inhale 2 puffs into the lungs 4 (four) times daily.   LEVOTHYROXINE (SYNTHROID, LEVOTHROID) 25 MCG TABLET    Take 25 mcg by mouth daily before breakfast.   NITROGLYCERIN (NITROSTAT) 0.4 MG SL TABLET    Place 0.4 mg under the tongue every 5 (five) minutes as needed for chest pain. x3 doses for for chest pain   POTASSIUM CHLORIDE SA (K-DUR,KLOR-CON) 20 MEQ TABLET    Take 1 tablet (20 mEq total) by mouth daily.    RAPAFLO 8 MG CAPS    Take 8 mg by mouth daily.    SENNA-DOCUSATE (SENOKOT-S) 8.6-50 MG PER TABLET    Take 2 tablets by mouth at bedtime as needed for mild constipation.   WARFARIN (COUMADIN) 5 MG TABLET    Take 4 mg by mouth daily.  Modified Medications   No medications on file  Discontinued Medications   No medications on file    SIGNIFICANT DIAGNOSTIC EXAMS  01-08-14: chest x-ray: No evidence of acute cardiopulmonary disease. Chronic interstitial markings/emphysematous changes with biapical pleural parenchymal scarring.  01-10-14: TEE: Left ventricle: The cavity size was normal. Systolic function was normal. The estimated ejection fraction was in the range of 50% to 55%. Wall motion was normal; there were no regional wall motion abnormalities. - Aortic valve: Trileaflet; normal thickness, mildly calcified leaflets.  01-20-14: chest x-ray: New right upper lobe infiltrate superimposed over chronic changes bilaterally.  01-21-14: ct of head: Volume loss and white matter changes as above. No definite CT evidence of acute intracranial abnormality.     LABS REVIEWED:   01-09-14: tsh 8.916; chol 185; ldl 117; trig 110 01-20-14: wbc 17.1; hgb 11.6; hct 33.0; mcv 87.4 plt  362; glucose 110; bun 29; creat 0.88; k+4.6; na++138; BNP 1528.0 01-21-14: wbc 15.3; hgb 10.7; hct 30.9; mcv 86.6; plt 386 ;glucose 109; bun 25; creat 0.80; k+4.2; na++138; liver normal albumin 2.3 01-25-14: wbc 13.4; hgb 11.1; hct 33.4; mcv 89.8; plt 465; glucose 83; bun 23; creat 0.89; k+3.9; na++139; mag 2.3     Review of Systems  Constitutional: Negative for malaise/fatigue.  Respiratory: negative for cough . Negative for shortness of breath and wheezing.    Cardiovascular: Negative for chest pain, palpitations and leg swelling.  Gastrointestinal: Negative for heartburn and constipation.  Musculoskeletal: Negative for joint pain and myalgias.  Skin: Negative.   Psychiatric/Behavioral: Negative for depression. The patient  is not nervous/anxious.      Physical Exam  Constitutional: No distress.  frail  Neck: Neck supple. No JVD present.  Cardiovascular: Normal rate and intact distal pulses.   Heart rate irregular   Respiratory: Effort normal. No respiratory distress. He has no wheezes.  Has diminished sounds throughout  GI: Soft. Bowel sounds are normal. He exhibits no distension. There is no tenderness.  Musculoskeletal: He exhibits no edema.  Is able to move all extremities.  Neurological: He is alert.  Skin: Skin is warm and dry. He is not diaphoretic.  Psychiatric: He has a normal mood and affect.     ASSESSMENT/ PLAN:  1. Afib/anticoagulation management: for his inr of 2.1; will begin coumadin 3.5 mg daily and iwll check inr on Monday and will monitor his status.     Synthia Innocent NP [facility]  Contact [phone] Monday through Friday 8am- 5pm  After hours call [phone]

## 2014-02-14 ENCOUNTER — Non-Acute Institutional Stay (SKILLED_NURSING_FACILITY): Payer: Medicare Other | Admitting: Adult Health

## 2014-02-14 DIAGNOSIS — I4891 Unspecified atrial fibrillation: Secondary | ICD-10-CM

## 2014-02-14 DIAGNOSIS — Z7901 Long term (current) use of anticoagulants: Secondary | ICD-10-CM

## 2014-02-16 ENCOUNTER — Non-Acute Institutional Stay (SKILLED_NURSING_FACILITY): Payer: Medicare Other | Admitting: Adult Health

## 2014-02-16 DIAGNOSIS — Z7901 Long term (current) use of anticoagulants: Secondary | ICD-10-CM

## 2014-02-16 DIAGNOSIS — I4891 Unspecified atrial fibrillation: Secondary | ICD-10-CM

## 2014-02-17 ENCOUNTER — Encounter: Payer: Self-pay | Admitting: Adult Health

## 2014-02-17 NOTE — Progress Notes (Signed)
Patient ID: [name], male   DOB: 1928-05-02, 80 y.o.   MRN: [id number]     [facility]  Allergies  Allergen Reactions  . Fenofibrate Other (See Comments)    REACTION: "ran me up a wall"  . Penicillins Other (See Comments)    blisters  . Atorvastatin Other (See Comments)    REACTION: severe muscle aches  . Ezetimibe-Simvastatin Other (See Comments)    REACTION: severe muscle aches     Chief Complaint  Patient presents with  . Acute Visit    coumadin management     HPI:  He is on chronic coumadin therapy for his afib. His heart rate is under control. His inr today is 1.6 and he is taking coumadin 3.5 mg daily. There are no concerns being voiced by the nursing staff at this time.    Past Medical History  Diagnosis Date  . CAD (coronary artery disease)   . Hyperlipidemia   . Kidney stones   . BPH (benign prostatic hypertrophy)   . Osteoarthritis   . COPD (chronic obstructive pulmonary disease)   . Hypothyroidism   . Atrial fibrillation   . Impaired fasting glucose   . Myocardial infarction   . Hypertension   . Shortness of breath   . CHF (congestive heart failure)   . GERD (gastroesophageal reflux disease)     Past Surgical History  Procedure Laterality Date  . Inguinal hernia repair  09/2004    left  . Back surgery  1990's    x3  . Transurethral resection of prostate  2002  . Pleural scarification  1980    right  . Esophagogastroduodenoscopy  04/2002    negative  . Bladder stone removal  06/2006  . Colonoscopy Left 04/13/2013    Procedure: COLONOSCOPY;  Surgeon: Willis Modena, MD;  Location: [facility] ENDOSCOPY;  Service: Endoscopy;  Laterality: Left;    VITAL SIGNS BP 130/61  Pulse 62  Ht 5\' 7"  (1.702 m)  Wt 117 lb (53.071 kg)  BMI 18.32 kg/m2   Patient's Medications  New Prescriptions   No medications on file  Previous Medications   ACETAMINOPHEN (TYLENOL) 325 MG TABLET    Take 2 tablets (650 mg total) by mouth every 6 (six) hours as needed  for mild pain, fever or headache.   ALBUTEROL (PROAIR HFA) 108 (90 BASE) MCG/ACT INHALER    Inhale 2 puffs into the lungs every 4 (four) hours as needed for wheezing or shortness of breath.   AMIODARONE (PACERONE) 200 MG TABLET    Take 1 tablet (200 mg total) by mouth 2 (two) times daily.   AMITRIPTYLINE (ELAVIL) 25 MG TABLET    Take 25 mg by mouth at bedtime.   ASPIRIN 81 MG CHEWABLE TABLET    Chew 1 tablet (81 mg total) by mouth daily.   BUDESONIDE (PULMICORT) 0.25 MG/2ML NEBULIZER SOLUTION    Take 2 mLs (0.25 mg total) by nebulization 2 (two) times daily.   CARVEDILOL (COREG) 3.125 MG TABLET    Take 3.125 mg by mouth 2 (two) times daily with a meal.   DILTIAZEM (CARDIZEM CD) 300 MG 24 HR CAPSULE    Take 1 capsule (300 mg total) by mouth daily.   FEEDING SUPPLEMENT, ENSURE, (ENSURE) PUDG    Take 1 Container by mouth 3 (three) times daily between meals.   FLUTICASONE (FLONASE) 50 MCG/ACT NASAL SPRAY    Place 2 sprays into the nose daily. 2 spray, Each Nare, Daily   FOOD THICKENER (THICK IT)  POWD    As needed to make liquids nectar consistently   FUROSEMIDE (LASIX) 40 MG TABLET    Take 1 tablet (40 mg total) by mouth daily.   GUAIFENESIN (ROBITUSSIN) 100 MG/5ML SOLN    Take 5 mLs by mouth every 4 (four) hours as needed for cough or to loosen phlegm.   HYDROCODONE-ACETAMINOPHEN (NORCO) 10-325 MG PER TABLET    Take 1 tablet by mouth every 8 (eight) hours as needed for severe pain.   IPRATROPIUM (ATROVENT HFA) 17 MCG/ACT INHALER    Inhale 2 puffs into the lungs 4 (four) times daily.   LEVOTHYROXINE (SYNTHROID, LEVOTHROID) 25 MCG TABLET    Take 25 mcg by mouth daily before breakfast.   NITROGLYCERIN (NITROSTAT) 0.4 MG SL TABLET    Place 0.4 mg under the tongue every 5 (five) minutes as needed for chest pain. x3 doses for for chest pain   POTASSIUM CHLORIDE SA (K-DUR,KLOR-CON) 20 MEQ TABLET    Take 1 tablet (20 mEq total) by mouth daily.   RAPAFLO 8 MG CAPS    Take 8 mg by mouth daily.     SENNA-DOCUSATE (SENOKOT-S) 8.6-50 MG PER TABLET    Take 2 tablets by mouth at bedtime as needed for mild constipation.   WARFARIN (COUMADIN) 5 MG TABLET    Take 4 mg by mouth daily.  Modified Medications   No medications on file  Discontinued Medications   No medications on file    SIGNIFICANT DIAGNOSTIC EXAMS  01-08-14: chest x-ray: No evidence of acute cardiopulmonary disease. Chronic interstitial markings/emphysematous changes with biapical pleural parenchymal scarring.  01-10-14: TEE: Left ventricle: The cavity size was normal. Systolic function was normal. The estimated ejection fraction was in the range of 50% to 55%. Wall motion was normal; there were no regional wall motion abnormalities. - Aortic valve: Trileaflet; normal thickness, mildly calcified leaflets.  01-20-14: chest x-ray: New right upper lobe infiltrate superimposed over chronic changes bilaterally.  01-21-14: ct of head: Volume loss and white matter changes as above. No definite CT evidence of acute intracranial abnormality.     LABS REVIEWED:   01-09-14: tsh 8.916; chol 185; ldl 117; trig 110 01-20-14: wbc 17.1; hgb 11.6; hct 33.0; mcv 87.4 plt  362; glucose 110; bun 29; creat 0.88; k+4.6; na++138; BNP 1528.0 01-21-14: wbc 15.3; hgb 10.7; hct 30.9; mcv 86.6; plt 386 ;glucose 109; bun 25; creat 0.80; k+4.2; na++138; liver normal albumin 2.3 01-25-14: wbc 13.4; hgb 11.1; hct 33.4; mcv 89.8; plt 465; glucose 83; bun 23; creat 0.89; k+3.9; na++139; mag 2.3     Review of Systems  Constitutional: Negative for malaise/fatigue.  Respiratory: negative for cough . Negative for shortness of breath and wheezing.    Cardiovascular: Negative for chest pain, palpitations and leg swelling.  Gastrointestinal: Negative for heartburn and constipation.  Musculoskeletal: Negative for joint pain and myalgias.  Skin: Negative.   Psychiatric/Behavioral: Negative for depression. The patient is not nervous/anxious.      Physical Exam    Constitutional: No distress.  frail  Neck: Neck supple. No JVD present.  Cardiovascular: Normal rate and intact distal pulses.   Heart rate irregular   Respiratory: Effort normal. No respiratory distress. He has no wheezes.  Has diminished sounds throughout  GI: Soft. Bowel sounds are normal. He exhibits no distension. There is no tenderness.  Musculoskeletal: He exhibits no edema.  Is able to move all extremities.   Neurological: He is alert.  Skin: Skin is warm and dry. He is not  diaphoretic.  Psychiatric: He has a normal mood and affect.      ASSESSMENT/ PLAN:  1. Afib/anticoagulation management: for his inr of 1.6 will being coumadin 4 mg and will check inr in 2 days will monitor his status.     Synthia Innocenteborah Chevelle Coulson NP [facility]  Contact [phone] Monday through Friday 8am- 5pm  After hours call [phone]

## 2014-02-18 ENCOUNTER — Non-Acute Institutional Stay (SKILLED_NURSING_FACILITY): Payer: Medicare Other | Admitting: Adult Health

## 2014-02-18 DIAGNOSIS — R259 Unspecified abnormal involuntary movements: Secondary | ICD-10-CM

## 2014-02-18 LAB — TSH: TSH: 69.598

## 2014-02-21 ENCOUNTER — Non-Acute Institutional Stay (SKILLED_NURSING_FACILITY): Payer: Medicare Other | Admitting: Adult Health

## 2014-02-21 DIAGNOSIS — I5032 Chronic diastolic (congestive) heart failure: Secondary | ICD-10-CM

## 2014-02-21 DIAGNOSIS — K5900 Constipation, unspecified: Secondary | ICD-10-CM

## 2014-02-21 DIAGNOSIS — Z7901 Long term (current) use of anticoagulants: Secondary | ICD-10-CM

## 2014-02-21 DIAGNOSIS — J449 Chronic obstructive pulmonary disease, unspecified: Secondary | ICD-10-CM

## 2014-02-21 DIAGNOSIS — M519 Unspecified thoracic, thoracolumbar and lumbosacral intervertebral disc disorder: Secondary | ICD-10-CM

## 2014-02-21 DIAGNOSIS — R1310 Dysphagia, unspecified: Secondary | ICD-10-CM

## 2014-02-21 DIAGNOSIS — N400 Enlarged prostate without lower urinary tract symptoms: Secondary | ICD-10-CM

## 2014-02-21 DIAGNOSIS — I10 Essential (primary) hypertension: Secondary | ICD-10-CM

## 2014-02-21 DIAGNOSIS — E039 Hypothyroidism, unspecified: Secondary | ICD-10-CM

## 2014-02-21 DIAGNOSIS — I4891 Unspecified atrial fibrillation: Secondary | ICD-10-CM

## 2014-02-22 ENCOUNTER — Non-Acute Institutional Stay (SKILLED_NURSING_FACILITY): Payer: Medicare Other | Admitting: Adult Health

## 2014-02-22 ENCOUNTER — Encounter: Payer: Self-pay | Admitting: Adult Health

## 2014-02-22 DIAGNOSIS — I5032 Chronic diastolic (congestive) heart failure: Secondary | ICD-10-CM

## 2014-02-22 DIAGNOSIS — E039 Hypothyroidism, unspecified: Secondary | ICD-10-CM

## 2014-02-22 DIAGNOSIS — J4489 Other specified chronic obstructive pulmonary disease: Secondary | ICD-10-CM

## 2014-02-22 DIAGNOSIS — R1310 Dysphagia, unspecified: Secondary | ICD-10-CM

## 2014-02-22 DIAGNOSIS — J449 Chronic obstructive pulmonary disease, unspecified: Secondary | ICD-10-CM

## 2014-02-22 NOTE — Progress Notes (Signed)
Patient ID: [name], male   DOB: May 07, 1928, 80 y.o.   MRN: [id number]     [facility]  Allergies  Allergen Reactions  . Fenofibrate Other (See Comments)    REACTION: "ran me up a wall"  . Penicillins Other (See Comments)    blisters  . Atorvastatin Other (See Comments)    REACTION: severe muscle aches  . Ezetimibe-Simvastatin Other (See Comments)    REACTION: severe muscle aches     Chief Complaint  Patient presents with  . Acute Visit    coumadin management     HPI:  He is on long term coumadin therapy for his afib. His inr today is 2.2 and he is taking coumadin 4 mg daily. There are no concerns being voiced by the nursing staff at this time. He is not voicing any complaints at this time.     Past Medical History  Diagnosis Date  . CAD (coronary artery disease)   . Hyperlipidemia   . Kidney stones   . BPH (benign prostatic hypertrophy)   . Osteoarthritis   . COPD (chronic obstructive pulmonary disease)   . Hypothyroidism   . Atrial fibrillation   . Impaired fasting glucose   . Myocardial infarction   . Hypertension   . Shortness of breath   . CHF (congestive heart failure)   . GERD (gastroesophageal reflux disease)     Past Surgical History  Procedure Laterality Date  . Inguinal hernia repair  09/2004    left  . Back surgery  1990's    x3  . Transurethral resection of prostate  2002  . Pleural scarification  1980    right  . Esophagogastroduodenoscopy  04/2002    negative  . Bladder stone removal  06/2006  . Colonoscopy Left 04/13/2013    Procedure: COLONOSCOPY;  Surgeon: Willis Modena, MD;  Location: [facility] ENDOSCOPY;  Service: Endoscopy;  Laterality: Left;    VITAL SIGNS BP 119/78  Pulse 68  Ht 5\' 7"  (1.702 m)  Wt 109 lb (49.442 kg)  BMI 17.07 kg/m2   Patient's Medications  New Prescriptions   No medications on file  Previous Medications   ACETAMINOPHEN (TYLENOL) 325 MG TABLET    Take 2 tablets (650 mg total) by mouth every 6 (six)  hours as needed for mild pain, fever or headache.   ALBUTEROL (PROAIR HFA) 108 (90 BASE) MCG/ACT INHALER    Inhale 2 puffs into the lungs every 4 (four) hours as needed for wheezing or shortness of breath.   AMIODARONE (PACERONE) 200 MG TABLET    Take 1 tablet (200 mg total) by mouth 2 (two) times daily.   AMITRIPTYLINE (ELAVIL) 25 MG TABLET    Take 25 mg by mouth at bedtime.   ASPIRIN 81 MG CHEWABLE TABLET    Chew 1 tablet (81 mg total) by mouth daily.   BUDESONIDE (PULMICORT) 0.25 MG/2ML NEBULIZER SOLUTION    Take 2 mLs (0.25 mg total) by nebulization 2 (two) times daily.   CARVEDILOL (COREG) 3.125 MG TABLET    Take 3.125 mg by mouth 2 (two) times daily with a meal.   DILTIAZEM (CARDIZEM CD) 300 MG 24 HR CAPSULE    Take 1 capsule (300 mg total) by mouth daily.   FEEDING SUPPLEMENT, ENSURE, (ENSURE) PUDG    Take 1 Container by mouth 3 (three) times daily between meals.   FLUTICASONE (FLONASE) 50 MCG/ACT NASAL SPRAY    Place 2 sprays into the nose daily. 2 spray, Each Nare, Daily  FOOD THICKENER (THICK IT) POWD    As needed to make liquids nectar consistently   FUROSEMIDE (LASIX) 40 MG TABLET    Take 1 tablet (40 mg total) by mouth daily.   GUAIFENESIN (ROBITUSSIN) 100 MG/5ML SOLN    Take 5 mLs by mouth every 4 (four) hours as needed for cough or to loosen phlegm.   HYDROCODONE-ACETAMINOPHEN (NORCO) 10-325 MG PER TABLET    Take 1 tablet by mouth every 8 (eight) hours as needed for severe pain.   IPRATROPIUM (ATROVENT HFA) 17 MCG/ACT INHALER    Inhale 2 puffs into the lungs 4 (four) times daily.   LEVOTHYROXINE (SYNTHROID, LEVOTHROID) 25 MCG TABLET    Take 25 mcg by mouth daily before breakfast.   NITROGLYCERIN (NITROSTAT) 0.4 MG SL TABLET    Place 0.4 mg under the tongue every 5 (five) minutes as needed for chest pain. x3 doses for for chest pain   POTASSIUM CHLORIDE SA (K-DUR,KLOR-CON) 20 MEQ TABLET    Take 1 tablet (20 mEq total) by mouth daily.   RAPAFLO 8 MG CAPS    Take 8 mg by mouth daily.      SENNA-DOCUSATE (SENOKOT-S) 8.6-50 MG PER TABLET    Take 2 tablets by mouth at bedtime as needed for mild constipation.   WARFARIN (COUMADIN) 5 MG TABLET    Take 4 mg by mouth daily.  Modified Medications   No medications on file  Discontinued Medications   No medications on file    SIGNIFICANT DIAGNOSTIC EXAMS  01-08-14: chest x-ray: No evidence of acute cardiopulmonary disease. Chronic interstitial markings/emphysematous changes with biapical pleural parenchymal scarring.  01-10-14: TEE: Left ventricle: The cavity size was normal. Systolic function was normal. The estimated ejection fraction was in the range of 50% to 55%. Wall motion was normal; there were no regional wall motion abnormalities. - Aortic valve: Trileaflet; normal thickness, mildly calcified leaflets.  01-20-14: chest x-ray: New right upper lobe infiltrate superimposed over chronic changes bilaterally.  01-21-14: ct of head: Volume loss and white matter changes as above. No definite CT evidence of acute intracranial abnormality.     LABS REVIEWED:   01-09-14: tsh 8.916; chol 185; ldl 117; trig 110 01-20-14: wbc 17.1; hgb 11.6; hct 33.0; mcv 87.4 plt  362; glucose 110; bun 29; creat 0.88; k+4.6; na++138; BNP 1528.0 01-21-14: wbc 15.3; hgb 10.7; hct 30.9; mcv 86.6; plt 386 ;glucose 109; bun 25; creat 0.80; k+4.2; na++138; liver normal albumin 2.3 01-25-14: wbc 13.4; hgb 11.1; hct 33.4; mcv 89.8; plt 465; glucose 83; bun 23; creat 0.89; k+3.9; na++139; mag 2.3     Review of Systems  Constitutional: Negative for malaise/fatigue.  Respiratory: negative for cough . Negative for shortness of breath and wheezing.    Cardiovascular: Negative for chest pain, palpitations and leg swelling.  Gastrointestinal: Negative for heartburn and constipation.  Musculoskeletal: Negative for joint pain and myalgias.  Skin: Negative.   Psychiatric/Behavioral: Negative for depression. The patient is not nervous/anxious.      Physical Exam   Constitutional: No distress.  frail  Neck: Neck supple. No JVD present.  Cardiovascular: Normal rate and intact distal pulses.   Heart rate irregular   Respiratory: Effort normal. No respiratory distress. Lung sounds clear He has no wheezes.  GI: Soft. Bowel sounds are normal. He exhibits no distension. There is no tenderness.  Musculoskeletal: He exhibits no edema.  Is able to move all extremities.   Neurological: He is alert.  Skin: Skin is warm and dry. He  is not diaphoretic.  Psychiatric: He has a normal mood and affect.       ASSESSMENT/ PLAN:  1. Afib/anticoagulation management for his inr of 2.2 will continue coumadin 4 mg daily and will check inr on Monday and will monitor his status.      Synthia Innocenteborah Green NP [facility]  Contact [phone] Monday through Friday 8am- 5pm  After hours call [phone]

## 2014-02-24 ENCOUNTER — Encounter: Payer: Self-pay | Admitting: Adult Health

## 2014-02-24 DIAGNOSIS — R259 Unspecified abnormal involuntary movements: Secondary | ICD-10-CM | POA: Insufficient documentation

## 2014-02-24 DIAGNOSIS — I10 Essential (primary) hypertension: Secondary | ICD-10-CM | POA: Insufficient documentation

## 2014-02-24 LAB — BASIC METABOLIC PANEL
ALBUMIN: 3
BUN: 16 mg/dL (ref 4–21)
Creat: 1
Glucose: 80
Potassium: 4.6 mmol/L
SODIUM: 139 mmol/L (ref 137–147)

## 2014-02-24 LAB — CBC
HCT: 35 %
Hemoglobin: 10.3 g/dL
MCV: 94.8 fL (ref 79–97)
Platelets: 265 10*3/uL
WBC: 7.4

## 2014-02-24 LAB — VITAMIN B12: VITAMIN B12 BIND CAP: 374

## 2014-02-24 NOTE — Progress Notes (Signed)
Patient ID: [name], male   DOB: 02-27-1928, 80 y.o.   MRN: [id number]     [facility]  Allergies  Allergen Reactions  . Fenofibrate Other (See Comments)    REACTION: "ran me up a wall"  . Penicillins Other (See Comments)    blisters  . Atorvastatin Other (See Comments)    REACTION: severe muscle aches  . Ezetimibe-Simvastatin Other (See Comments)    REACTION: severe muscle aches     Chief Complaint  Patient presents with  . Acute Visit    abnormal movements    HPI:  Nursing staff reports that he had an episode yesterday of abnormal movements. The movements were monoclonic in nature involving all extremities in which he could not control the movement. Today there is no abnormal movement present. He does have a very fine resting tremor present. He states he has had the tremor long term. He states he has not experienced anything like this before.    Past Medical History  Diagnosis Date  . CAD (coronary artery disease)   . Hyperlipidemia   . Kidney stones   . BPH (benign prostatic hypertrophy)   . Osteoarthritis   . COPD (chronic obstructive pulmonary disease)   . Hypothyroidism   . Atrial fibrillation   . Impaired fasting glucose   . Myocardial infarction   . Hypertension   . Shortness of breath   . CHF (congestive heart failure)   . GERD (gastroesophageal reflux disease)     Past Surgical History  Procedure Laterality Date  . Inguinal hernia repair  09/2004    left  . Back surgery  1990's    x3  . Transurethral resection of prostate  2002  . Pleural scarification  1980    right  . Esophagogastroduodenoscopy  04/2002    negative  . Bladder stone removal  06/2006  . Colonoscopy Left 04/13/2013    Procedure: COLONOSCOPY;  Surgeon: Willis Modena, MD;  Location: [facility] ENDOSCOPY;  Service: Endoscopy;  Laterality: Left;    VITAL SIGNS BP 106/52  Pulse 57  Ht 5\' 7"  (1.702 m)  Wt 117 lb (53.071 kg)  BMI 18.32 kg/m2   Patient's Medications  New  Prescriptions   No medications on file  Previous Medications   ACETAMINOPHEN (TYLENOL) 325 MG TABLET    Take 2 tablets (650 mg total) by mouth every 6 (six) hours as needed for mild pain, fever or headache.   ALBUTEROL (PROAIR HFA) 108 (90 BASE) MCG/ACT INHALER    Inhale 2 puffs into the lungs every 4 (four) hours as needed for wheezing or shortness of breath.   AMIODARONE (PACERONE) 200 MG TABLET    Take 1 tablet (200 mg total) by mouth 2 (two) times daily.   AMITRIPTYLINE (ELAVIL) 25 MG TABLET    Take 25 mg by mouth at bedtime.   ASPIRIN 81 MG CHEWABLE TABLET    Chew 1 tablet (81 mg total) by mouth daily.   BUDESONIDE (PULMICORT) 0.25 MG/2ML NEBULIZER SOLUTION    Take 2 mLs (0.25 mg total) by nebulization 2 (two) times daily.   CARVEDILOL (COREG) 3.125 MG TABLET    Take 3.125 mg by mouth 2 (two) times daily with a meal.   DILTIAZEM (CARDIZEM CD) 300 MG 24 HR CAPSULE    Take 1 capsule (300 mg total) by mouth daily.   FEEDING SUPPLEMENT, ENSURE, (ENSURE) PUDG    Take 1 Container by mouth 3 (three) times daily between meals.   FLUTICASONE (FLONASE) 50 MCG/ACT NASAL  SPRAY    Place 2 sprays into the nose daily. 2 spray, Each Nare, Daily   FOOD THICKENER (THICK IT) POWD    As needed to make liquids nectar consistently   FUROSEMIDE (LASIX) 40 MG TABLET    Take 1 tablet (40 mg total) by mouth daily.   GUAIFENESIN (ROBITUSSIN) 100 MG/5ML SOLN    Take 5 mLs by mouth every 4 (four) hours as needed for cough or to loosen phlegm.   HYDROCODONE-ACETAMINOPHEN (NORCO) 10-325 MG PER TABLET    Take 1 tablet by mouth every 8 (eight) hours as needed for severe pain.   IPRATROPIUM (ATROVENT HFA) 17 MCG/ACT INHALER    Inhale 2 puffs into the lungs 4 (four) times daily.   LEVOTHYROXINE (SYNTHROID, LEVOTHROID) 25 MCG TABLET    Take 25 mcg by mouth daily before breakfast.   NITROGLYCERIN (NITROSTAT) 0.4 MG SL TABLET    Place 0.4 mg under the tongue every 5 (five) minutes as needed for chest pain. x3 doses for for chest  pain   POTASSIUM CHLORIDE SA (K-DUR,KLOR-CON) 20 MEQ TABLET    Take 1 tablet (20 mEq total) by mouth daily.   RAPAFLO 8 MG CAPS    Take 8 mg by mouth daily.    SENNA-DOCUSATE (SENOKOT-S) 8.6-50 MG PER TABLET    Take 2 tablets by mouth at bedtime as needed for mild constipation.   WARFARIN (COUMADIN) 5 MG TABLET    Take 4 mg by mouth daily.  Modified Medications   No medications on file  Discontinued Medications   No medications on file    SIGNIFICANT DIAGNOSTIC EXAMS  01-08-14: chest x-ray: No evidence of acute cardiopulmonary disease. Chronic interstitial markings/emphysematous changes with biapical pleural parenchymal scarring.  01-10-14: TEE: Left ventricle: The cavity size was normal. Systolic function was normal. The estimated ejection fraction was in the range of 50% to 55%. Wall motion was normal; there were no regional wall motion abnormalities. - Aortic valve: Trileaflet; normal thickness, mildly calcified leaflets.  01-20-14: chest x-ray: New right upper lobe infiltrate superimposed over chronic changes bilaterally.  01-21-14: ct of head: Volume loss and white matter changes as above. No definite CT evidence of acute intracranial abnormality.     LABS REVIEWED:   01-09-14: tsh 8.916; chol 185; ldl 117; trig 110 01-20-14: wbc 17.1; hgb 11.6; hct 33.0; mcv 87.4 plt  362; glucose 110; bun 29; creat 0.88; k+4.6; na++138; BNP 1528.0 01-21-14: wbc 15.3; hgb 10.7; hct 30.9; mcv 86.6; plt 386 ;glucose 109; bun 25; creat 0.80; k+4.2; na++138; liver normal albumin 2.3 01-25-14: wbc 13.4; hgb 11.1; hct 33.4; mcv 89.8; plt 465; glucose 83; bun 23; creat 0.89; k+3.9; na++139; mag 2.3  02-01-14: wbc 9.3; hgb 9.9; hct 32.9; mcv 94 plt 388; glucose 75; bun 14; creat 0.9; k+4.2; na++137 liver normal albumin 2.5     Review of Systems  Constitutional: Negative for malaise/fatigue.  Respiratory: negative for cough . Negative for shortness of breath and wheezing.    Cardiovascular: Negative for chest  pain, palpitations and leg swelling.  Gastrointestinal: Negative for heartburn and constipation.  Musculoskeletal: Negative for joint pain and myalgias. no further abnormal movements  Skin: Negative.   Psychiatric/Behavioral: Negative for depression. The patient is not nervous/anxious.     Physical Exam  Constitutional: No distress.  frail  Neck: Neck supple. No JVD present.  Cardiovascular: Normal rate and intact distal pulses.   Heart rate irregular   Respiratory: Effort normal and breath sounds normal. No respiratory distress. He  has no wheezes.  GI: Soft. Bowel sounds are normal. He exhibits no distension. There is no tenderness.  Musculoskeletal: Normal range of motion. He exhibits no edema.  Neurological: He is alert. He has normal reflexes. No cranial nerve deficit. Coordination normal.  Has very fine bilateral resting tremor present.   Skin: Skin is warm and dry. He is not diaphoretic.  Psychiatric: He has a normal mood and affect.      ASSESSMENT/ PLAN:  1. Mono-clonic movements: will check cbc; bmp; b-12; folate; mag level and tsh are all pending. Will not make further changes at this time as he has not had any further episodes and will monitor his status.      Synthia Innocent NP [facility]  Contact [phone] Monday through Friday 8am- 5pm  After hours call [phone]

## 2014-02-24 NOTE — Progress Notes (Signed)
Patient ID: [name], male   DOB: Dec 20, 1928, 80 y.o.   MRN: [id number]     [facility]  Allergies  Allergen Reactions  . Fenofibrate Other (See Comments)    REACTION: "ran me up a wall"  . Penicillins Other (See Comments)    blisters  . Atorvastatin Other (See Comments)    REACTION: severe muscle aches  . Ezetimibe-Simvastatin Other (See Comments)    REACTION: severe muscle aches     Chief Complaint  Patient presents with  . Medical Managment of Chronic Issues    HPI:  He is being seen for the management of his chronic illnesses. Overall his status is improving. His tsh is extremely elevated at 34; he will require his medication to be adjusted. He has not had further abnormal movements at this time. There are no concerns being voiced by the nursing staff at this time.   Past Medical History  Diagnosis Date  . CAD (coronary artery disease)   . Hyperlipidemia   . Kidney stones   . BPH (benign prostatic hypertrophy)   . Osteoarthritis   . COPD (chronic obstructive pulmonary disease)   . Hypothyroidism   . Atrial fibrillation   . Impaired fasting glucose   . Myocardial infarction   . Hypertension   . Shortness of breath   . CHF (congestive heart failure)   . GERD (gastroesophageal reflux disease)     Past Surgical History  Procedure Laterality Date  . Inguinal hernia repair  09/2004    left  . Back surgery  1990's    x3  . Transurethral resection of prostate  2002  . Pleural scarification  1980    right  . Esophagogastroduodenoscopy  04/2002    negative  . Bladder stone removal  06/2006  . Colonoscopy Left 04/13/2013    Procedure: COLONOSCOPY;  Surgeon: Willis Modena, MD;  Location: [facility] ENDOSCOPY;  Service: Endoscopy;  Laterality: Left;    VITAL SIGNS BP 130/61  Pulse 62  Ht 5\' 7"  (1.702 m)  Wt 115 lb (52.164 kg)  BMI 18.01 kg/m2   Patient's Medications  New Prescriptions   No medications on file  Previous Medications   ACETAMINOPHEN  (TYLENOL) 325 MG TABLET    Take 2 tablets (650 mg total) by mouth every 6 (six) hours as needed for mild pain, fever or headache.   ALBUTEROL (PROAIR HFA) 108 (90 BASE) MCG/ACT INHALER    Inhale 2 puffs into the lungs every 4 (four) hours as needed for wheezing or shortness of breath.   AMIODARONE (PACERONE) 200 MG TABLET    Take 1 tablet (200 mg total) by mouth 2 (two) times daily.   AMITRIPTYLINE (ELAVIL) 25 MG TABLET    Take 25 mg by mouth at bedtime.   ASPIRIN 81 MG CHEWABLE TABLET    Chew 1 tablet (81 mg total) by mouth daily.   BUDESONIDE (PULMICORT) 0.25 MG/2ML NEBULIZER SOLUTION    Take 2 mLs (0.25 mg total) by nebulization 2 (two) times daily.   CARVEDILOL (COREG) 3.125 MG TABLET    Take 3.125 mg by mouth 2 (two) times daily with a meal.   DILTIAZEM (CARDIZEM CD) 300 MG 24 HR CAPSULE    Take 1 capsule (300 mg total) by mouth daily.   FEEDING SUPPLEMENT, ENSURE, (ENSURE) PUDG    Take 1 Container by mouth 3 (three) times daily between meals.   FLUTICASONE (FLONASE) 50 MCG/ACT NASAL SPRAY    Place 2 sprays into the nose daily. 2 spray,  Each Nare, Daily   FOOD THICKENER (THICK IT) POWD    As needed to make liquids nectar consistently   FUROSEMIDE (LASIX) 40 MG TABLET    Take 1 tablet (40 mg total) by mouth daily.   GUAIFENESIN (ROBITUSSIN) 100 MG/5ML SOLN    Take 5 mLs by mouth every 4 (four) hours as needed for cough or to loosen phlegm.   HYDROCODONE-ACETAMINOPHEN (NORCO) 10-325 MG PER TABLET    Take 1 tablet by mouth every 8 (eight) hours as needed for severe pain.   IPRATROPIUM (ATROVENT HFA) 17 MCG/ACT INHALER    Inhale 2 puffs into the lungs 4 (four) times daily.   LEVOTHYROXINE (SYNTHROID, LEVOTHROID) 25 MCG TABLET    Take 25 mcg by mouth daily before breakfast.   NITROGLYCERIN (NITROSTAT) 0.4 MG SL TABLET    Place 0.4 mg under the tongue every 5 (five) minutes as needed for chest pain. x3 doses for for chest pain   POTASSIUM CHLORIDE SA (K-DUR,KLOR-CON) 20 MEQ TABLET    Take 1 tablet  (20 mEq total) by mouth daily.   RAPAFLO 8 MG CAPS    Take 8 mg by mouth daily.    SENNA-DOCUSATE (SENOKOT-S) 8.6-50 MG PER TABLET    Take 2 tablets by mouth at bedtime as needed for mild constipation.   WARFARIN (COUMADIN) 5 MG TABLET    Take 4 mg by mouth daily.  Modified Medications   No medications on file  Discontinued Medications   No medications on file    SIGNIFICANT DIAGNOSTIC EXAMS  01-08-14: chest x-ray: No evidence of acute cardiopulmonary disease. Chronic interstitial markings/emphysematous changes with biapical pleural parenchymal scarring.  01-10-14: TEE: Left ventricle: The cavity size was normal. Systolic function was normal. The estimated ejection fraction was in the range of 50% to 55%. Wall motion was normal; there were no regional wall motion abnormalities. - Aortic valve: Trileaflet; normal thickness, mildly calcified leaflets.  01-20-14: chest x-ray: New right upper lobe infiltrate superimposed over chronic changes bilaterally.  01-21-14: ct of head: Volume loss and white matter changes as above. No definite CT evidence of acute intracranial abnormality.     LABS REVIEWED:   01-09-14: tsh 8.916; chol 185; ldl 117; trig 110 01-20-14: wbc 17.1; hgb 11.6; hct 33.0; mcv 87.4 plt  362; glucose 110; bun 29; creat 0.88; k+4.6; na++138; BNP 1528.0 01-21-14: wbc 15.3; hgb 10.7; hct 30.9; mcv 86.6; plt 386 ;glucose 109; bun 25; creat 0.80; k+4.2; na++138; liver normal albumin 2.3 01-25-14: wbc 13.4; hgb 11.1; hct 33.4; mcv 89.8; plt 465; glucose 83; bun 23; creat 0.89; k+3.9; na++139; mag 2.3  02-01-14: wbc 9.3; hgb 9.9; hct 32.9; mcv 94 plt 388; glucose 75; bun 14; creat 0.9; k+4.2; na++137 liver normal albumin 2.5  02-18-14: wbc 7.4; hgb 10.3; hct 34.7; mcv 94.8; plt 265; glucose 80; bun 16; creat 1.0; k+4.6; na++139 liver normal albumin 3.0; mag 2.3; folic 14.370; vit b12: 374; tsh 69.598     Review of Systems  Constitutional: Negative for malaise/fatigue.  Respiratory:  negative for cough . Negative for shortness of breath and wheezing.    Cardiovascular: Negative for chest pain, palpitations and leg swelling.  Gastrointestinal: Negative for heartburn and constipation.  Musculoskeletal: Negative for joint pain and myalgias. no further abnormal movements  Skin: Negative.   Psychiatric/Behavioral: Negative for depression. The patient is not nervous/anxious.     Physical Exam  Constitutional: No distress.  frail  Neck: Neck supple. No JVD present.  Cardiovascular: Normal rate and intact  distal pulses.   Heart rate irregular   Respiratory: Effort normal and breath sounds normal. No respiratory distress. He has no wheezes.  GI: Soft. Bowel sounds are normal. He exhibits no distension. There is no tenderness.  Musculoskeletal: Normal range of motion. He exhibits no edema.  Neurological: He is alert. He has normal reflexes. No cranial nerve deficit. Coordination normal.    Skin: Skin is warm and dry. He is not diaphoretic.  Psychiatric: He has a normal mood and affect.       ASSESSMENT/ PLAN:  1. Dysphagia: no signs of aspiration present; will continue nectar thick liquids for meals; is able ot have thin liquids in between meals.   2. Afib: heart rate is under control will continue amiodarone 200 gm twice daily; asa 81 mg daily is on long term coumadin therapy  3. Anticoagulation management: for his inr of 4.3 on 4 mg daily will hold his coumadin for 2 days and will recheck  4. Hypertension: will continue cardizem cd 300 mg daily; coreg 3.125 mg twice daily; and will monitor his status.   5. Hypothyroidism: his tsh is very high; will increase synthroid to 75 mcg daily and will check tsh free t3 and free t4 in 6 week. Will monitor his status.   6. CHF: he is presently stable will continue lasix 40 mg daily with k+20 meq daily and ntg daily as needed.  and will monitor his status.  7.  BPH: will continue rapiflo 8 mg daily  8.COPD: is stable will  continue pulmicort neb treatment twice daily; atrovent inhaler 2 puffs four times daily and flonase daily and albuterol 2 puffs every 4 hours as needed   9. Lumbar disc disease: is stable will continue elavil 25 mg nighlty and hs vicodin 10/325 mg every 8 hours as needed for pain will monitor   10. Constipation: will continue senna s 2 tabs daily    Synthia Innocent NP [facility]  Contact [phone] Monday through Friday 8am- 5pm  After hours call [phone]

## 2014-02-28 ENCOUNTER — Non-Acute Institutional Stay (SKILLED_NURSING_FACILITY): Payer: Medicare Other | Admitting: Adult Health

## 2014-02-28 DIAGNOSIS — Z7901 Long term (current) use of anticoagulants: Secondary | ICD-10-CM

## 2014-02-28 DIAGNOSIS — I4891 Unspecified atrial fibrillation: Secondary | ICD-10-CM

## 2014-03-01 ENCOUNTER — Non-Acute Institutional Stay (SKILLED_NURSING_FACILITY): Payer: Medicare Other | Admitting: Adult Health

## 2014-03-01 DIAGNOSIS — I4891 Unspecified atrial fibrillation: Secondary | ICD-10-CM

## 2014-03-01 DIAGNOSIS — I5032 Chronic diastolic (congestive) heart failure: Secondary | ICD-10-CM

## 2014-03-01 DIAGNOSIS — J449 Chronic obstructive pulmonary disease, unspecified: Secondary | ICD-10-CM

## 2014-03-01 DIAGNOSIS — R5381 Other malaise: Secondary | ICD-10-CM

## 2014-03-01 DIAGNOSIS — R531 Weakness: Secondary | ICD-10-CM

## 2014-03-01 DIAGNOSIS — R5383 Other fatigue: Secondary | ICD-10-CM

## 2014-03-02 NOTE — Progress Notes (Signed)
Patient ID: [name], male   DOB: 1928-04-28, 80 y.o.   MRN: [id number]     [facility]  Allergies  Allergen Reactions  . Fenofibrate Other (See Comments)    REACTION: "ran me up a wall"  . Penicillins Other (See Comments)    blisters  . Atorvastatin Other (See Comments)    REACTION: severe muscle aches  . Ezetimibe-Simvastatin Other (See Comments)    REACTION: severe muscle aches    Chief Complaint  Patient presents with  . Acute Visit    family meeting     HPI:  His family has requested a meeting to discuss his overall status; his dysphagia and his future discharge plans. His goal remains to return back home. He is doing well with therapy; and is nearing time for discharge. His dysphagia will be chronic due to mechanical issues present. His family does understand nectar thick liquids. We discussed his episode of spastic movements. He has not had further abnormal movements at this time; if he any more in the future he will need a neurology consult. We did discuss his thyroid disease.    Past Medical History  Diagnosis Date  . CAD (coronary artery disease)   . Hyperlipidemia   . Kidney stones   . BPH (benign prostatic hypertrophy)   . Osteoarthritis   . COPD (chronic obstructive pulmonary disease)   . Hypothyroidism   . Atrial fibrillation   . Impaired fasting glucose   . Myocardial infarction   . Hypertension   . Shortness of breath   . CHF (congestive heart failure)   . GERD (gastroesophageal reflux disease)     Past Surgical History  Procedure Laterality Date  . Inguinal hernia repair  09/2004    left  . Back surgery  1990's    x3  . Transurethral resection of prostate  2002  . Pleural scarification  1980    right  . Esophagogastroduodenoscopy  04/2002    negative  . Bladder stone removal  06/2006  . Colonoscopy Left 04/13/2013    Procedure: COLONOSCOPY;  Surgeon: Willis ModenaWilliam Outlaw, MD;  Location: [facility] ENDOSCOPY;  Service: Endoscopy;  Laterality: Left;     VITAL SIGNS BP 132/84  Pulse 67  Ht 5\' 7"  (1.702 m)  Wt 112 lb 9.6 oz (51.075 kg)  BMI 17.63 kg/m2   Patient's Medications  New Prescriptions   No medications on file  Previous Medications   ACETAMINOPHEN (TYLENOL) 325 MG TABLET    Take 2 tablets (650 mg total) by mouth every 6 (six) hours as needed for mild pain, fever or headache.   ALBUTEROL (PROAIR HFA) 108 (90 BASE) MCG/ACT INHALER    Inhale 2 puffs into the lungs every 4 (four) hours as needed for wheezing or shortness of breath.   AMIODARONE (PACERONE) 200 MG TABLET    Take 1 tablet (200 mg total) by mouth 2 (two) times daily.   AMITRIPTYLINE (ELAVIL) 25 MG TABLET    Take 25 mg by mouth at bedtime.   ASPIRIN 81 MG CHEWABLE TABLET    Chew 1 tablet (81 mg total) by mouth daily.   BUDESONIDE (PULMICORT) 0.25 MG/2ML NEBULIZER SOLUTION    Take 2 mLs (0.25 mg total) by nebulization 2 (two) times daily.   CARVEDILOL (COREG) 3.125 MG TABLET    Take 3.125 mg by mouth 2 (two) times daily with a meal.   DILTIAZEM (CARDIZEM CD) 300 MG 24 HR CAPSULE    Take 1 capsule (300 mg total) by mouth daily.  FEEDING SUPPLEMENT, ENSURE, (ENSURE) PUDG    Take 1 Container by mouth 3 (three) times daily between meals.   FLUTICASONE (FLONASE) 50 MCG/ACT NASAL SPRAY    Place 2 sprays into the nose daily. 2 spray, Each Nare, Daily   FOOD THICKENER (THICK IT) POWD    As needed to make liquids nectar consistently   FUROSEMIDE (LASIX) 40 MG TABLET    Take 1 tablet (40 mg total) by mouth daily.   GUAIFENESIN (ROBITUSSIN) 100 MG/5ML SOLN    Take 5 mLs by mouth every 4 (four) hours as needed for cough or to loosen phlegm.   HYDROCODONE-ACETAMINOPHEN (NORCO) 10-325 MG PER TABLET    Take 1 tablet by mouth every 8 (eight) hours as needed for severe pain.   IPRATROPIUM (ATROVENT HFA) 17 MCG/ACT INHALER    Inhale 2 puffs into the lungs 4 (four) times daily.   LEVOTHYROXINE (SYNTHROID, LEVOTHROID) 25 MCG TABLET    Take 25 mcg by mouth daily before breakfast.    NITROGLYCERIN (NITROSTAT) 0.4 MG SL TABLET    Place 0.4 mg under the tongue every 5 (five) minutes as needed for chest pain. x3 doses for for chest pain   POTASSIUM CHLORIDE SA (K-DUR,KLOR-CON) 20 MEQ TABLET    Take 1 tablet (20 mEq total) by mouth daily.   RAPAFLO 8 MG CAPS    Take 8 mg by mouth daily.    SENNA-DOCUSATE (SENOKOT-S) 8.6-50 MG PER TABLET    Take 2 tablets by mouth at bedtime as needed for mild constipation.   WARFARIN (COUMADIN) 5 MG TABLET    Take 4 mg by mouth daily.  Modified Medications   No medications on file  Discontinued Medications   No medications on file    SIGNIFICANT DIAGNOSTIC EXAMS  01-08-14: chest x-ray: No evidence of acute cardiopulmonary disease. Chronic interstitial markings/emphysematous changes with biapical pleural parenchymal scarring.  01-10-14: TEE: Left ventricle: The cavity size was normal. Systolic function was normal. The estimated ejection fraction was in the range of 50% to 55%. Wall motion was normal; there were no regional wall motion abnormalities. - Aortic valve: Trileaflet; normal thickness, mildly calcified leaflets.  01-20-14: chest x-ray: New right upper lobe infiltrate superimposed over chronic changes bilaterally.  01-21-14: ct of head: Volume loss and white matter changes as above. No definite CT evidence of acute intracranial abnormality.     LABS REVIEWED:   01-09-14: tsh 8.916; chol 185; ldl 117; trig 110 01-20-14: wbc 17.1; hgb 11.6; hct 33.0; mcv 87.4 plt  362; glucose 110; bun 29; creat 0.88; k+4.6; na++138; BNP 1528.0 01-21-14: wbc 15.3; hgb 10.7; hct 30.9; mcv 86.6; plt 386 ;glucose 109; bun 25; creat 0.80; k+4.2; na++138; liver normal albumin 2.3 01-25-14: wbc 13.4; hgb 11.1; hct 33.4; mcv 89.8; plt 465; glucose 83; bun 23; creat 0.89; k+3.9; na++139; mag 2.3  02-01-14: wbc 9.3; hgb 9.9; hct 32.9; mcv 94 plt 388; glucose 75; bun 14; creat 0.9; k+4.2; na++137 liver normal albumin 2.5  02-18-14: wbc 7.4; hgb 10.3; hct 34.7; mcv  94.8; plt 265; glucose 80; bun 16; creat 1.0; k+4.6; na++139 liver normal albumin 3.0; mag 2.3; folic 14.370; vit b12: 374; tsh 69.598     Review of Systems  Constitutional: Negative for malaise/fatigue.  Respiratory: negative for cough . Negative for shortness of breath and wheezing.    Cardiovascular: Negative for chest pain, palpitations and leg swelling.  Gastrointestinal: Negative for heartburn and constipation.  Musculoskeletal: Negative for joint pain and myalgias. no further abnormal movements  Skin: Negative.   Psychiatric/Behavioral: Negative for depression. The patient is not nervous/anxious.     Physical Exam  Constitutional: No distress.  frail  Neck: Neck supple. No JVD present.  Cardiovascular: Normal rate and intact distal pulses.   Heart rate irregular   Respiratory: Effort normal and breath sounds normal. No respiratory distress. He has no wheezes.  GI: Soft. Bowel sounds are normal. He exhibits no distension. There is no tenderness.  Musculoskeletal: Normal range of motion. He exhibits no edema.  Neurological: He is alert. He has normal reflexes. No cranial nerve deficit. Coordination normal.    Skin: Skin is warm and dry. He is not diaphoretic.  Psychiatric: He has a normal mood and affect.      ASSESSMENT/ PLAN:   In light of her not having any further spastic episodes will continue to monitor his status. The social services will prepare him for discharge over the next one to two weeks. Will continue to monitor his status.   Time spent with patient 50 minutes.        Synthia Innocent NP [facility]  Contact [phone] Monday through Friday 8am- 5pm  After hours call [phone]

## 2014-03-03 ENCOUNTER — Other Ambulatory Visit: Payer: Self-pay | Admitting: Internal Medicine

## 2014-03-04 ENCOUNTER — Telehealth: Payer: Self-pay | Admitting: Internal Medicine

## 2014-03-04 NOTE — Telephone Encounter (Signed)
Patient's daughter called to let you know patient was released from rehab yesterday.  She made an appointment for him to f/u with you on 03/21/14.

## 2014-03-05 ENCOUNTER — Encounter: Payer: Self-pay | Admitting: Adult Health

## 2014-03-05 NOTE — Telephone Encounter (Signed)
That is good to hear 

## 2014-03-05 NOTE — Progress Notes (Signed)
Patient ID: [name], male   DOB: 06-08-1928, 80 y.o.   MRN: [id number]     [facility]  Allergies  Allergen Reactions  . Fenofibrate Other (See Comments)    REACTION: "ran me up a wall"  . Penicillins Other (See Comments)    blisters  . Atorvastatin Other (See Comments)    REACTION: severe muscle aches  . Ezetimibe-Simvastatin Other (See Comments)    REACTION: severe muscle aches     Chief Complaint  Patient presents with  . Acute Visit    coumadin management     HPI:  [name] is on long term coumadin for afib. Hie heart rate is controlled. [name] is tolerating coumadin therapy without difficulty. His inr today is 2.7 and is taking coumadin 4 mg daily.    Past Medical History  Diagnosis Date  . CAD (coronary artery disease)   . Hyperlipidemia   . Kidney stones   . BPH (benign prostatic hypertrophy)   . Osteoarthritis   . COPD (chronic obstructive pulmonary disease)   . Hypothyroidism   . Atrial fibrillation   . Impaired fasting glucose   . Myocardial infarction   . Hypertension   . Shortness of breath   . CHF (congestive heart failure)   . GERD (gastroesophageal reflux disease)     Past Surgical History  Procedure Laterality Date  . Inguinal hernia repair  09/2004    left  . Back surgery  1990's    x3  . Transurethral resection of prostate  2002  . Pleural scarification  1980    right  . Esophagogastroduodenoscopy  04/2002    negative  . Bladder stone removal  06/2006  . Colonoscopy Left 04/13/2013    Procedure: COLONOSCOPY;  Surgeon: Willis Modena, MD;  Location: [facility] ENDOSCOPY;  Service: Endoscopy;  Laterality: Left;    VITAL SIGNS BP 111/57  Pulse 68  Ht 5\' 7"  (1.702 m)  Wt 191 lb 12.8 oz (87 kg)  BMI 30.03 kg/m2   Patient's Medications  New Prescriptions   No medications on file  Previous Medications   ACETAMINOPHEN (TYLENOL) 325 MG TABLET    Take 2 tablets (650 mg total) by mouth every 6 (six) hours as needed for mild pain, fever or  headache.   ALBUTEROL (PROAIR HFA) 108 (90 BASE) MCG/ACT INHALER    Inhale 2 puffs into the lungs every 4 (four) hours as needed for wheezing or shortness of breath.   AMIODARONE (PACERONE) 200 MG TABLET    Take 1 tablet (200 mg total) by mouth 2 (two) times daily.   AMITRIPTYLINE (ELAVIL) 25 MG TABLET    Take 25 mg by mouth at bedtime.   ASPIRIN 81 MG CHEWABLE TABLET    Chew 1 tablet (81 mg total) by mouth daily.   BUDESONIDE (PULMICORT) 0.25 MG/2ML NEBULIZER SOLUTION    Take 2 mLs (0.25 mg total) by nebulization 2 (two) times daily.   CARVEDILOL (COREG) 3.125 MG TABLET    Take 3.125 mg by mouth 2 (two) times daily with a meal.   DILTIAZEM (CARDIZEM CD) 300 MG 24 HR CAPSULE    Take 1 capsule (300 mg total) by mouth daily.   FEEDING SUPPLEMENT, ENSURE, (ENSURE) PUDG    Take 1 Container by mouth 3 (three) times daily between meals.   FLUTICASONE (FLONASE) 50 MCG/ACT NASAL SPRAY    Place 2 sprays into the nose daily. 2 spray, Each Nare, Daily   FOOD THICKENER (THICK IT) POWD    As needed to  make liquids nectar consistently   FUROSEMIDE (LASIX) 40 MG TABLET    Take 1 tablet (40 mg total) by mouth daily.   GUAIFENESIN (ROBITUSSIN) 100 MG/5ML SOLN    Take 5 mLs by mouth every 4 (four) hours as needed for cough or to loosen phlegm.   HYDROCODONE-ACETAMINOPHEN (NORCO) 10-325 MG PER TABLET    Take 1 tablet by mouth every 8 (eight) hours as needed for severe pain.   IPRATROPIUM (ATROVENT HFA) 17 MCG/ACT INHALER    Inhale 2 puffs into the lungs 4 (four) times daily.   LEVOTHYROXINE (SYNTHROID, LEVOTHROID) 25 MCG TABLET    Take 25 mcg by mouth daily before breakfast.   NITROGLYCERIN (NITROSTAT) 0.4 MG SL TABLET    Place 0.4 mg under the tongue every 5 (five) minutes as needed for chest pain. x3 doses for for chest pain   NITROSTAT 0.4 MG SL TABLET    PLACE 1 TABLET UNDER THE TONGUE EVERY 5 MINUTES AS NEEDED FOR CHEST PAIN   POTASSIUM CHLORIDE SA (K-DUR,KLOR-CON) 20 MEQ TABLET    Take 1 tablet (20 mEq total)  by mouth daily.   RAPAFLO 8 MG CAPS    Take 8 mg by mouth daily.    SENNA-DOCUSATE (SENOKOT-S) 8.6-50 MG PER TABLET    Take 2 tablets by mouth at bedtime as needed for mild constipation.   WARFARIN (COUMADIN) 5 MG TABLET    Take 4 mg by mouth daily.  Modified Medications   No medications on file  Discontinued Medications   No medications on file    SIGNIFICANT DIAGNOSTIC EXAMS  01-08-14: chest x-ray: No evidence of acute cardiopulmonary disease. Chronic interstitial markings/emphysematous changes with biapical pleural parenchymal scarring.  01-10-14: TEE: Left ventricle: The cavity size was normal. Systolic function was normal. The estimated ejection fraction was in the range of 50% to 55%. Wall motion was normal; there were no regional wall motion abnormalities. - Aortic valve: Trileaflet; normal thickness, mildly calcified leaflets.  01-20-14: chest x-ray: New right upper lobe infiltrate superimposed over chronic changes bilaterally.  01-21-14: ct of head: Volume loss and white matter changes as above. No definite CT evidence of acute intracranial abnormality.     LABS REVIEWED:   01-09-14: tsh 8.916; chol 185; ldl 117; trig 110 01-20-14: wbc 17.1; hgb 11.6; hct 33.0; mcv 87.4 plt  362; glucose 110; bun 29; creat 0.88; k+4.6; na++138; BNP 1528.0 01-21-14: wbc 15.3; hgb 10.7; hct 30.9; mcv 86.6; plt 386 ;glucose 109; bun 25; creat 0.80; k+4.2; na++138; liver normal albumin 2.3 01-25-14: wbc 13.4; hgb 11.1; hct 33.4; mcv 89.8; plt 465; glucose 83; bun 23; creat 0.89; k+3.9; na++139; mag 2.3  02-01-14: wbc 9.3; hgb 9.9; hct 32.9; mcv 94 plt 388; glucose 75; bun 14; creat 0.9; k+4.2; na++137 liver normal albumin 2.5  02-18-14: wbc 7.4; hgb 10.3; hct 34.7; mcv 94.8; plt 265; glucose 80; bun 16; creat 1.0; k+4.6; na++139 liver normal albumin 3.0; mag 2.3; folic 14.370; vit b12: 374; tsh 69.598     Review of Systems  Constitutional: Negative for malaise/fatigue.  Respiratory: negative for cough .  Negative for shortness of breath and wheezing.    Cardiovascular: Negative for chest pain, palpitations and leg swelling.  Gastrointestinal: Negative for heartburn and constipation.  Musculoskeletal: Negative for joint pain and myalgias.  Skin: Negative.   Psychiatric/Behavioral: Negative for depression. The patient is not nervous/anxious.     Physical Exam  Constitutional: No distress.  frail  Neck: Neck supple. No JVD present.  Cardiovascular:  Normal rate and intact distal pulses.   Heart rate irregular   Respiratory: Effort normal and breath sounds normal. No respiratory distress. [name] has no wheezes.  GI: Soft. Bowel sounds are normal. [name] exhibits no distension. There is no tenderness.  Musculoskeletal: Normal range of motion. [name] exhibits no edema.  Neurological: [name] is alert. .    Skin: Skin is warm and dry. [name] is not diaphoretic.  Psychiatric: [name] has a normal mood and affect.     ASSESSMENT/ PLAN:  1. Afib/anticoagulation management: his heart rate is presently under control; for his inr of 2.7 will continue coumadin 4 mg daily and will check inr in one week.         Synthia Innocent NP [facility]  Contact [phone] Monday through Friday 8am- 5pm  After hours call [phone]

## 2014-03-05 NOTE — Progress Notes (Signed)
Patient ID: [name], male   DOB: 10-28-1928, 80 y.o.   MRN: [id number]     [facility]  Allergies  Allergen Reactions  . Fenofibrate Other (See Comments)    REACTION: "ran me up a wall"  . Penicillins Other (See Comments)    blisters  . Atorvastatin Other (See Comments)    REACTION: severe muscle aches  . Ezetimibe-Simvastatin Other (See Comments)    REACTION: severe muscle aches     Chief Complaint  Patient presents with  . Discharge Note    HPI:  He is being discharged to home with home health for pt/to/rn/couomadin monitoring. He will need rollator in order to maintain his current level of independence. He will need  His prescriptions to be written. He will need an inr on 03-07-14.     Past Medical History  Diagnosis Date  . CAD (coronary artery disease)   . Hyperlipidemia   . Kidney stones   . BPH (benign prostatic hypertrophy)   . Osteoarthritis   . COPD (chronic obstructive pulmonary disease)   . Hypothyroidism   . Atrial fibrillation   . Impaired fasting glucose   . Myocardial infarction   . Hypertension   . Shortness of breath   . CHF (congestive heart failure)   . GERD (gastroesophageal reflux disease)     Past Surgical History  Procedure Laterality Date  . Inguinal hernia repair  09/2004    left  . Back surgery  1990's    x3  . Transurethral resection of prostate  2002  . Pleural scarification  1980    right  . Esophagogastroduodenoscopy  04/2002    negative  . Bladder stone removal  06/2006  . Colonoscopy Left 04/13/2013    Procedure: COLONOSCOPY;  Surgeon: Willis Modena, MD;  Location: [facility] ENDOSCOPY;  Service: Endoscopy;  Laterality: Left;    VITAL SIGNS BP 111/69  Pulse 79  Ht 5\' 7"  (1.702 m)  Wt 112 lb 9.6 oz (51.075 kg)  BMI 17.63 kg/m2   Patient's Medications  New Prescriptions   No medications on file  Previous Medications   ACETAMINOPHEN (TYLENOL) 325 MG TABLET    Take 2 tablets (650 mg total) by mouth every 6 (six)  hours as needed for mild pain, fever or headache.   ALBUTEROL (PROAIR HFA) 108 (90 BASE) MCG/ACT INHALER    Inhale 2 puffs into the lungs every 4 (four) hours as needed for wheezing or shortness of breath.   AMIODARONE (PACERONE) 200 MG TABLET    Take 1 tablet (200 mg total) by mouth 2 (two) times daily.   AMITRIPTYLINE (ELAVIL) 25 MG TABLET    Take 25 mg by mouth at bedtime.   ASPIRIN 81 MG CHEWABLE TABLET    Chew 1 tablet (81 mg total) by mouth daily.   BUDESONIDE (PULMICORT) 0.25 MG/2ML NEBULIZER SOLUTION    Take 2 mLs (0.25 mg total) by nebulization 2 (two) times daily.   CARVEDILOL (COREG) 3.125 MG TABLET    Take 3.125 mg by mouth 2 (two) times daily with a meal.   DILTIAZEM (CARDIZEM CD) 300 MG 24 HR CAPSULE    Take 1 capsule (300 mg total) by mouth daily.   FEEDING SUPPLEMENT, ENSURE, (ENSURE) PUDG    Take 1 Container by mouth 3 (three) times daily between meals.   FLUTICASONE (FLONASE) 50 MCG/ACT NASAL SPRAY    Place 2 sprays into the nose daily. 2 spray, Each Nare, Daily   FOOD THICKENER (THICK IT) POWD  As needed to make liquids nectar consistently   FUROSEMIDE (LASIX) 40 MG TABLET    Take 1 tablet (40 mg total) by mouth daily.   GUAIFENESIN (ROBITUSSIN) 100 MG/5ML SOLN    Take 5 mLs by mouth every 4 (four) hours as needed for cough or to loosen phlegm.   HYDROCODONE-ACETAMINOPHEN (NORCO) 10-325 MG PER TABLET    Take 1 tablet by mouth every 8 (eight) hours as needed for severe pain.   IPRATROPIUM (ATROVENT HFA) 17 MCG/ACT INHALER    Inhale 2 puffs into the lungs 4 (four) times daily.   LEVOTHYROXINE (SYNTHROID, LEVOTHROID) 25 MCG TABLET    Take 25 mcg by mouth daily before breakfast.   NITROGLYCERIN (NITROSTAT) 0.4 MG SL TABLET    Place 0.4 mg under the tongue every 5 (five) minutes as needed for chest pain. x3 doses for for chest pain   NITROSTAT 0.4 MG SL TABLET    PLACE 1 TABLET UNDER THE TONGUE EVERY 5 MINUTES AS NEEDED FOR CHEST PAIN   POTASSIUM CHLORIDE SA (K-DUR,KLOR-CON) 20 MEQ  TABLET    Take 1 tablet (20 mEq total) by mouth daily.   RAPAFLO 8 MG CAPS    Take 8 mg by mouth daily.    SENNA-DOCUSATE (SENOKOT-S) 8.6-50 MG PER TABLET    Take 2 tablets by mouth at bedtime as needed for mild constipation.   WARFARIN (COUMADIN) 5 MG TABLET    Take 4 mg by mouth daily.  Modified Medications   No medications on file  Discontinued Medications   No medications on file    SIGNIFICANT DIAGNOSTIC EXAMS  01-08-14: chest x-ray: No evidence of acute cardiopulmonary disease. Chronic interstitial markings/emphysematous changes with biapical pleural parenchymal scarring.  01-10-14: TEE: Left ventricle: The cavity size was normal. Systolic function was normal. The estimated ejection fraction was in the range of 50% to 55%. Wall motion was normal; there were no regional wall motion abnormalities. - Aortic valve: Trileaflet; normal thickness, mildly calcified leaflets.  01-20-14: chest x-ray: New right upper lobe infiltrate superimposed over chronic changes bilaterally.  01-21-14: ct of head: Volume loss and white matter changes as above. No definite CT evidence of acute intracranial abnormality.  03-07-14: chest x-ray; marked improvement in chest with changes consistent with resolving pneumonia; prominent changes of copd       LABS REVIEWED:   01-09-14: tsh 8.916; chol 185; ldl 117; trig 110 01-20-14: wbc 17.1; hgb 11.6; hct 33.0; mcv 87.4 plt  362; glucose 110; bun 29; creat 0.88; k+4.6; na++138; BNP 1528.0 01-21-14: wbc 15.3; hgb 10.7; hct 30.9; mcv 86.6; plt 386 ;glucose 109; bun 25; creat 0.80; k+4.2; na++138; liver normal albumin 2.3 01-25-14: wbc 13.4; hgb 11.1; hct 33.4; mcv 89.8; plt 465; glucose 83; bun 23; creat 0.89; k+3.9; na++139; mag 2.3  02-01-14: wbc 9.3; hgb 9.9; hct 32.9; mcv 94 plt 388; glucose 75; bun 14; creat 0.9; k+4.2; na++137 liver normal albumin 2.5  02-18-14: wbc 7.4; hgb 10.3; hct 34.7; mcv 94.8; plt 265; glucose 80; bun 16; creat 1.0; k+4.6; na++139 liver normal  albumin 3.0; mag 2.3; folic 14.370; vit b12: 374; tsh 69.598     Review of Systems  Constitutional: Negative for malaise/fatigue.  Respiratory: negative for cough . Negative for shortness of breath and wheezing.    Cardiovascular: Negative for chest pain, palpitations and leg swelling.  Gastrointestinal: Negative for heartburn and constipation.  Musculoskeletal: Negative for joint pain and myalgias.  Skin: Negative.   Psychiatric/Behavioral: Negative for depression. The patient is  not nervous/anxious.     Physical Exam  Constitutional: No distress.  frail  Neck: Neck supple. No JVD present.  Cardiovascular: Normal rate and intact distal pulses.   Heart rate irregular   Respiratory: Effort normal and breath sounds normal. No respiratory distress. He has no wheezes.  GI: Soft. Bowel sounds are normal. He exhibits no distension. There is no tenderness.  Musculoskeletal: Normal range of motion. He exhibits no edema.  Neurological: He is alert. .    Skin: Skin is warm and dry. He is not diaphoretic.  Psychiatric: He has a normal mood and affect.     ASSESSMENT/ PLAN:  Will discharge to home with home health for pt/ot/rn/ coumadin monitor inr due 03-07-14. Will need a rollator in order to maintain his current level of independence. His prescriptions have been written.    Time spent with patient 40 minutes.       Synthia Innocent NP [facility]  Contact [phone] Monday through Friday 8am- 5pm  After hours call [phone]

## 2014-03-07 ENCOUNTER — Other Ambulatory Visit: Payer: Self-pay | Admitting: Family Medicine

## 2014-03-07 MED ORDER — WARFARIN SODIUM 4 MG PO TABS: 4.0000 mg | ORAL_TABLET | Freq: Every day | ORAL | Status: DC

## 2014-03-08 ENCOUNTER — Ambulatory Visit (INDEPENDENT_AMBULATORY_CARE_PROVIDER_SITE_OTHER): Payer: Medicare Other | Admitting: Family Medicine

## 2014-03-08 LAB — POCT INR: INR: 8

## 2014-03-11 ENCOUNTER — Ambulatory Visit (INDEPENDENT_AMBULATORY_CARE_PROVIDER_SITE_OTHER): Payer: Medicare Other | Admitting: Family Medicine

## 2014-03-11 LAB — POCT INR: INR: 4.5

## 2014-03-12 ENCOUNTER — Inpatient Hospital Stay (HOSPITAL_COMMUNITY)
Admission: EM | Admit: 2014-03-12 | Discharge: 2014-03-17 | DRG: 177 | Disposition: A | Discharge status: Hospice | Payer: Medicare Other | Attending: Internal Medicine | Admitting: Internal Medicine

## 2014-03-12 ENCOUNTER — Emergency Department (HOSPITAL_COMMUNITY): Payer: Medicare Other

## 2014-03-12 ENCOUNTER — Encounter (HOSPITAL_COMMUNITY): Payer: Self-pay | Admitting: Emergency Medicine

## 2014-03-12 ENCOUNTER — Inpatient Hospital Stay (HOSPITAL_COMMUNITY): Payer: Medicare Other

## 2014-03-12 DIAGNOSIS — M159 Polyosteoarthritis, unspecified: Secondary | ICD-10-CM

## 2014-03-12 DIAGNOSIS — H5316 Psychophysical visual disturbances: Secondary | ICD-10-CM | POA: Diagnosis present

## 2014-03-12 DIAGNOSIS — R627 Adult failure to thrive: Secondary | ICD-10-CM | POA: Diagnosis present

## 2014-03-12 DIAGNOSIS — J4489 Other specified chronic obstructive pulmonary disease: Secondary | ICD-10-CM | POA: Diagnosis present

## 2014-03-12 DIAGNOSIS — I2510 Atherosclerotic heart disease of native coronary artery without angina pectoris: Secondary | ICD-10-CM | POA: Diagnosis present

## 2014-03-12 DIAGNOSIS — Z515 Encounter for palliative care: Secondary | ICD-10-CM

## 2014-03-12 DIAGNOSIS — R259 Unspecified abnormal involuntary movements: Secondary | ICD-10-CM | POA: Diagnosis present

## 2014-03-12 DIAGNOSIS — G589 Mononeuropathy, unspecified: Secondary | ICD-10-CM

## 2014-03-12 DIAGNOSIS — G479 Sleep disorder, unspecified: Secondary | ICD-10-CM | POA: Diagnosis present

## 2014-03-12 DIAGNOSIS — R5381 Other malaise: Secondary | ICD-10-CM | POA: Diagnosis present

## 2014-03-12 DIAGNOSIS — N183 Chronic kidney disease, stage 3 unspecified: Secondary | ICD-10-CM | POA: Diagnosis present

## 2014-03-12 DIAGNOSIS — Z7901 Long term (current) use of anticoagulants: Secondary | ICD-10-CM

## 2014-03-12 DIAGNOSIS — Z8249 Family history of ischemic heart disease and other diseases of the circulatory system: Secondary | ICD-10-CM

## 2014-03-12 DIAGNOSIS — I509 Heart failure, unspecified: Secondary | ICD-10-CM | POA: Diagnosis present

## 2014-03-12 DIAGNOSIS — N400 Enlarged prostate without lower urinary tract symptoms: Secondary | ICD-10-CM | POA: Diagnosis present

## 2014-03-12 DIAGNOSIS — E039 Hypothyroidism, unspecified: Secondary | ICD-10-CM | POA: Diagnosis present

## 2014-03-12 DIAGNOSIS — Z823 Family history of stroke: Secondary | ICD-10-CM

## 2014-03-12 DIAGNOSIS — R4182 Altered mental status, unspecified: Secondary | ICD-10-CM | POA: Insufficient documentation

## 2014-03-12 DIAGNOSIS — I10 Essential (primary) hypertension: Secondary | ICD-10-CM | POA: Diagnosis present

## 2014-03-12 DIAGNOSIS — Z880 Allergy status to penicillin: Secondary | ICD-10-CM

## 2014-03-12 DIAGNOSIS — Z87891 Personal history of nicotine dependence: Secondary | ICD-10-CM

## 2014-03-12 DIAGNOSIS — I252 Old myocardial infarction: Secondary | ICD-10-CM

## 2014-03-12 DIAGNOSIS — E785 Hyperlipidemia, unspecified: Secondary | ICD-10-CM | POA: Diagnosis present

## 2014-03-12 DIAGNOSIS — I129 Hypertensive chronic kidney disease with stage 1 through stage 4 chronic kidney disease, or unspecified chronic kidney disease: Secondary | ICD-10-CM | POA: Diagnosis present

## 2014-03-12 DIAGNOSIS — Z681 Body mass index (BMI) 19 or less, adult: Secondary | ICD-10-CM

## 2014-03-12 DIAGNOSIS — K219 Gastro-esophageal reflux disease without esophagitis: Secondary | ICD-10-CM | POA: Diagnosis present

## 2014-03-12 DIAGNOSIS — I5032 Chronic diastolic (congestive) heart failure: Secondary | ICD-10-CM | POA: Diagnosis present

## 2014-03-12 DIAGNOSIS — J690 Pneumonitis due to inhalation of food and vomit: Secondary | ICD-10-CM

## 2014-03-12 DIAGNOSIS — J189 Pneumonia, unspecified organism: Secondary | ICD-10-CM

## 2014-03-12 DIAGNOSIS — J449 Chronic obstructive pulmonary disease, unspecified: Secondary | ICD-10-CM

## 2014-03-12 DIAGNOSIS — Z66 Do not resuscitate: Secondary | ICD-10-CM | POA: Diagnosis present

## 2014-03-12 DIAGNOSIS — K5900 Constipation, unspecified: Secondary | ICD-10-CM

## 2014-03-12 DIAGNOSIS — G9340 Encephalopathy, unspecified: Secondary | ICD-10-CM | POA: Diagnosis present

## 2014-03-12 DIAGNOSIS — M519 Unspecified thoracic, thoracolumbar and lumbosacral intervertebral disc disorder: Secondary | ICD-10-CM

## 2014-03-12 DIAGNOSIS — Z888 Allergy status to other drugs, medicaments and biological substances status: Secondary | ICD-10-CM

## 2014-03-12 DIAGNOSIS — R1310 Dysphagia, unspecified: Secondary | ICD-10-CM | POA: Diagnosis present

## 2014-03-12 DIAGNOSIS — R531 Weakness: Secondary | ICD-10-CM

## 2014-03-12 DIAGNOSIS — I4891 Unspecified atrial fibrillation: Secondary | ICD-10-CM | POA: Diagnosis present

## 2014-03-12 DIAGNOSIS — E43 Unspecified severe protein-calorie malnutrition: Secondary | ICD-10-CM

## 2014-03-12 DIAGNOSIS — N200 Calculus of kidney: Secondary | ICD-10-CM

## 2014-03-12 LAB — CBC WITH DIFFERENTIAL/PLATELET
BASOS PCT: 1 % (ref 0–1)
Basophils Absolute: 0.1 10*3/uL (ref 0.0–0.1)
EOS ABS: 0.3 10*3/uL (ref 0.0–0.7)
Eosinophils Relative: 4 % (ref 0–5)
HCT: 32.8 % — ABNORMAL LOW (ref 39.0–52.0)
Hemoglobin: 10.7 g/dL — ABNORMAL LOW (ref 13.0–17.0)
Lymphocytes Relative: 9 % — ABNORMAL LOW (ref 12–46)
Lymphs Abs: 0.6 10*3/uL — ABNORMAL LOW (ref 0.7–4.0)
MCH: 29.6 pg (ref 26.0–34.0)
MCHC: 32.6 g/dL (ref 30.0–36.0)
MCV: 90.9 fL (ref 78.0–100.0)
Monocytes Absolute: 0.7 10*3/uL (ref 0.1–1.0)
Monocytes Relative: 10 % (ref 3–12)
NEUTROS PCT: 77 % (ref 43–77)
Neutro Abs: 5.7 10*3/uL (ref 1.7–7.7)
PLATELETS: 281 10*3/uL (ref 150–400)
RBC: 3.61 MIL/uL — ABNORMAL LOW (ref 4.22–5.81)
RDW: 15.8 % — AB (ref 11.5–15.5)
WBC: 7.3 10*3/uL (ref 4.0–10.5)

## 2014-03-12 LAB — COMPREHENSIVE METABOLIC PANEL
ALT: 15 U/L (ref 0–53)
AST: 20 U/L (ref 0–37)
Albumin: 2.8 g/dL — ABNORMAL LOW (ref 3.5–5.2)
Alkaline Phosphatase: 91 U/L (ref 39–117)
BUN: 15 mg/dL (ref 6–23)
CALCIUM: 9.1 mg/dL (ref 8.4–10.5)
CO2: 26 mEq/L (ref 19–32)
Chloride: 102 mEq/L (ref 96–112)
Creatinine, Ser: 1.22 mg/dL (ref 0.50–1.35)
GFR calc Af Amer: 61 mL/min — ABNORMAL LOW (ref >90)
GFR calc non Af Amer: 53 mL/min — ABNORMAL LOW (ref >90)
Glucose, Bld: 82 mg/dL (ref 70–99)
POTASSIUM: 4.5 meq/L (ref 3.7–5.3)
SODIUM: 139 meq/L (ref 137–147)
TOTAL PROTEIN: 7.2 g/dL (ref 6.0–8.3)
Total Bilirubin: 0.3 mg/dL (ref 0.3–1.2)

## 2014-03-12 LAB — PROTIME-INR
INR: 2.05 — ABNORMAL HIGH (ref 0.00–1.49)
PROTHROMBIN TIME: 22.5 s — AB (ref 11.6–15.2)

## 2014-03-12 LAB — URINALYSIS, ROUTINE W REFLEX MICROSCOPIC
Bilirubin Urine: NEGATIVE
Glucose, UA: NEGATIVE mg/dL
Hgb urine dipstick: NEGATIVE
Ketones, ur: NEGATIVE mg/dL
LEUKOCYTES UA: NEGATIVE
NITRITE: NEGATIVE
PH: 7.5 (ref 5.0–8.0)
Protein, ur: NEGATIVE mg/dL
SPECIFIC GRAVITY, URINE: 1.016 (ref 1.005–1.030)
Urobilinogen, UA: 0.2 mg/dL (ref 0.0–1.0)

## 2014-03-12 LAB — CBG MONITORING, ED: GLUCOSE-CAPILLARY: 78 mg/dL (ref 70–99)

## 2014-03-12 LAB — GRAM STAIN

## 2014-03-12 LAB — MAGNESIUM: MAGNESIUM: 2.7 mg/dL — AB (ref 1.5–2.5)

## 2014-03-12 MED ORDER — COUMADIN BOOK
1.0000 | Freq: Once | Status: DC
  Filled 2014-03-12: qty 1

## 2014-03-12 MED ORDER — WARFARIN SODIUM 5 MG PO TABS
5.0000 mg | ORAL_TABLET | Freq: Once | ORAL | Status: AC
  Administered 2014-03-12: 5 mg via ORAL
  Filled 2014-03-12: qty 1

## 2014-03-12 MED ORDER — SODIUM CHLORIDE 0.9 % IJ SOLN
3.0000 mL | Freq: Two times a day (BID) | INTRAMUSCULAR | Status: DC
  Administered 2014-03-13 – 2014-03-16 (×3): 3 mL via INTRAVENOUS

## 2014-03-12 MED ORDER — LEVOTHYROXINE SODIUM 75 MCG PO TABS
75.0000 ug | ORAL_TABLET | Freq: Every day | ORAL | Status: DC
  Administered 2014-03-13 – 2014-03-16 (×4): 75 ug via ORAL
  Filled 2014-03-12 (×5): qty 1

## 2014-03-12 MED ORDER — WARFARIN VIDEO: 1.0000 | Freq: Once | Status: DC

## 2014-03-12 MED ORDER — PIPERACILLIN-TAZOBACTAM 3.375 G IVPB 30 MIN
3.3750 g | Freq: Once | INTRAVENOUS | Status: AC
  Administered 2014-03-12: 3.375 g via INTRAVENOUS
  Filled 2014-03-12: qty 50

## 2014-03-12 MED ORDER — VANCOMYCIN HCL IN DEXTROSE 1-5 GM/200ML-% IV SOLN: 1000.0000 mg | Freq: Once | INTRAVENOUS | Status: DC

## 2014-03-12 MED ORDER — IPRATROPIUM BROMIDE 0.02 % IN SOLN
2.5000 mL | Freq: Four times a day (QID) | RESPIRATORY_TRACT | Status: DC
  Administered 2014-03-13 – 2014-03-14 (×8): 0.5 mg via RESPIRATORY_TRACT
  Filled 2014-03-12 (×8): qty 2.5

## 2014-03-12 MED ORDER — SENNOSIDES-DOCUSATE SODIUM 8.6-50 MG PO TABS
2.0000 | ORAL_TABLET | Freq: Every evening | ORAL | Status: DC | PRN
  Administered 2014-03-16: 2 via ORAL
  Filled 2014-03-12: qty 2

## 2014-03-12 MED ORDER — SODIUM CHLORIDE 0.9 % IJ SOLN: 3.0000 mL | INTRAMUSCULAR | Status: DC | PRN

## 2014-03-12 MED ORDER — AMITRIPTYLINE HCL 25 MG PO TABS
25.0000 mg | ORAL_TABLET | Freq: Every day | ORAL | Status: DC
  Administered 2014-03-12 – 2014-03-16 (×5): 25 mg via ORAL
  Filled 2014-03-12 (×6): qty 1

## 2014-03-12 MED ORDER — IOHEXOL 300 MG/ML  SOLN
80.0000 mL | Freq: Once | INTRAMUSCULAR | Status: AC | PRN
  Administered 2014-03-12: 80 mL via INTRAVENOUS

## 2014-03-12 MED ORDER — DEXTROSE 5 % IV SOLN
1.0000 g | Freq: Three times a day (TID) | INTRAVENOUS | Status: DC
  Administered 2014-03-12 – 2014-03-17 (×15): 1 g via INTRAVENOUS
  Filled 2014-03-12 (×18): qty 1

## 2014-03-12 MED ORDER — NITROGLYCERIN 0.4 MG SL SUBL: 0.4000 mg | SUBLINGUAL_TABLET | SUBLINGUAL | Status: DC | PRN

## 2014-03-12 MED ORDER — TAMSULOSIN HCL 0.4 MG PO CAPS
0.4000 mg | ORAL_CAPSULE | Freq: Every day | ORAL | Status: DC
  Administered 2014-03-13 – 2014-03-17 (×5): 0.4 mg via ORAL
  Filled 2014-03-12 (×7): qty 1

## 2014-03-12 MED ORDER — DILTIAZEM HCL ER COATED BEADS 300 MG PO CP24
300.0000 mg | ORAL_CAPSULE | Freq: Every day | ORAL | Status: DC
  Administered 2014-03-13 – 2014-03-17 (×5): 300 mg via ORAL
  Filled 2014-03-12 (×5): qty 1

## 2014-03-12 MED ORDER — IPRATROPIUM BROMIDE 0.02 % IN SOLN
2.5000 mL | Freq: Four times a day (QID) | RESPIRATORY_TRACT | Status: DC
  Filled 2014-03-12: qty 2.5

## 2014-03-12 MED ORDER — SODIUM CHLORIDE 0.9 % IV SOLN: 250.0000 mL | INTRAVENOUS | Status: DC | PRN

## 2014-03-12 MED ORDER — VANCOMYCIN HCL IN DEXTROSE 750-5 MG/150ML-% IV SOLN
750.0000 mg | INTRAVENOUS | Status: DC
  Administered 2014-03-13 – 2014-03-16 (×5): 750 mg via INTRAVENOUS
  Filled 2014-03-12 (×6): qty 150

## 2014-03-12 MED ORDER — GUAIFENESIN 100 MG/5ML PO SOLN
5.0000 mL | ORAL | Status: DC | PRN
  Administered 2014-03-14 – 2014-03-15 (×2): 100 mg via ORAL
  Filled 2014-03-12 (×2): qty 5

## 2014-03-12 MED ORDER — ASPIRIN 81 MG PO CHEW
81.0000 mg | CHEWABLE_TABLET | Freq: Every day | ORAL | Status: DC
  Administered 2014-03-13 – 2014-03-17 (×5): 81 mg via ORAL
  Filled 2014-03-12 (×7): qty 1

## 2014-03-12 MED ORDER — POTASSIUM CHLORIDE CRYS ER 20 MEQ PO TBCR
20.0000 meq | EXTENDED_RELEASE_TABLET | Freq: Every day | ORAL | Status: DC
  Administered 2014-03-13 – 2014-03-17 (×5): 20 meq via ORAL
  Filled 2014-03-12 (×7): qty 1

## 2014-03-12 MED ORDER — CARVEDILOL 3.125 MG PO TABS
3.1250 mg | ORAL_TABLET | Freq: Two times a day (BID) | ORAL | Status: DC
  Administered 2014-03-13 – 2014-03-17 (×9): 3.125 mg via ORAL
  Filled 2014-03-12 (×14): qty 1

## 2014-03-12 MED ORDER — FLUTICASONE PROPIONATE 50 MCG/ACT NA SUSP
2.0000 | Freq: Every day | NASAL | Status: DC
  Administered 2014-03-13 – 2014-03-17 (×5): 2 via NASAL
  Filled 2014-03-12: qty 16

## 2014-03-12 MED ORDER — BUDESONIDE 0.25 MG/2ML IN SUSP
0.2500 mg | Freq: Two times a day (BID) | RESPIRATORY_TRACT | Status: DC
  Administered 2014-03-12 – 2014-03-17 (×9): 0.25 mg via RESPIRATORY_TRACT
  Filled 2014-03-12 (×12): qty 2

## 2014-03-12 MED ORDER — LEVALBUTEROL HCL 1.25 MG/0.5ML IN NEBU
1.2500 mg | INHALATION_SOLUTION | Freq: Four times a day (QID) | RESPIRATORY_TRACT | Status: DC | PRN
  Filled 2014-03-12: qty 0.5

## 2014-03-12 MED ORDER — ACETAMINOPHEN 325 MG PO TABS
650.0000 mg | ORAL_TABLET | Freq: Four times a day (QID) | ORAL | Status: DC | PRN
  Administered 2014-03-14 – 2014-03-16 (×2): 650 mg via ORAL
  Filled 2014-03-12 (×2): qty 2

## 2014-03-12 MED ORDER — HYDROCODONE-ACETAMINOPHEN 10-325 MG PO TABS
1.0000 | ORAL_TABLET | Freq: Three times a day (TID) | ORAL | Status: DC | PRN
  Administered 2014-03-14 – 2014-03-17 (×4): 1 via ORAL
  Filled 2014-03-12 (×4): qty 1

## 2014-03-12 MED ORDER — LEVALBUTEROL HCL 1.25 MG/0.5ML IN NEBU
1.2500 mg | INHALATION_SOLUTION | Freq: Four times a day (QID) | RESPIRATORY_TRACT | Status: DC
  Administered 2014-03-13 (×2): 1.25 mg via RESPIRATORY_TRACT
  Filled 2014-03-12 (×6): qty 0.5

## 2014-03-12 MED ORDER — ENSURE PUDDING PO PUDG
1.0000 | Freq: Three times a day (TID) | ORAL | Status: DC
  Administered 2014-03-13 – 2014-03-17 (×14): 1 via ORAL

## 2014-03-12 MED ORDER — AZTREONAM 2 G IJ SOLR: 2.0000 g | Freq: Three times a day (TID) | INTRAMUSCULAR | Status: DC

## 2014-03-12 MED ORDER — FUROSEMIDE 40 MG PO TABS
40.0000 mg | ORAL_TABLET | Freq: Every day | ORAL | Status: DC
  Administered 2014-03-13 – 2014-03-17 (×5): 40 mg via ORAL
  Filled 2014-03-12 (×8): qty 1

## 2014-03-12 MED ORDER — WARFARIN - PHARMACIST DOSING INPATIENT: Freq: Every day | Status: DC

## 2014-03-12 MED ORDER — AMIODARONE HCL 200 MG PO TABS
200.0000 mg | ORAL_TABLET | Freq: Two times a day (BID) | ORAL | Status: DC
  Administered 2014-03-12 – 2014-03-17 (×10): 200 mg via ORAL
  Filled 2014-03-12 (×13): qty 1

## 2014-03-12 NOTE — Progress Notes (Addendum)
CONSULT NOTE - Initial Consult  Pharmacy Consult for Coumadin, Vancomycin and Azactam Indication: atrial fibrillation and pneumonia  Allergies  Allergen Reactions  . Fenofibrate Other (See Comments)    REACTION: "ran me up a wall"  . Penicillins Other (See Comments)    blisters  . Atorvastatin Other (See Comments)    REACTION: severe muscle aches  . Ezetimibe-Simvastatin Other (See Comments)    REACTION: severe muscle aches    Patient Measurements: Height: 5\' 7"  (170.2 cm) Weight: 119 lb 9.6 oz (54.25 kg) IBW/kg (Calculated) : 66.1 Heparin Dosing Weight:   Vital Signs: Temp: 97.8 F (36.6 C) (03/21 2048) Temp src: Oral (03/21 2048) BP: 167/64 mmHg (03/21 2048) Pulse Rate: 65 (03/21 2048)  Labs:  Recent Labs  03/11/14 1438 03/12/14 1648  HGB  --  10.7*  HCT  --  32.8*  PLT  --  281  LABPROT  --  22.5*  INR 4.5 2.05*  CREATININE  --  1.22    Estimated Creatinine Clearance: 35.2 ml/min (by C-G formula based on Cr of 1.22).   Medical History: Past Medical History  Diagnosis Date  . CAD (coronary artery disease)   . Hyperlipidemia   . Kidney stones   . BPH (benign prostatic hypertrophy)   . Osteoarthritis   . COPD (chronic obstructive pulmonary disease)   . Hypothyroidism   . Atrial fibrillation   . Impaired fasting glucose   . Myocardial infarction   . Hypertension   . Shortness of breath   . CHF (congestive heart failure)   . GERD (gastroesophageal reflux disease)     Medications:  Scheduled:  . amiodarone  200 mg Oral BID  . amitriptyline  25 mg Oral QHS  . aspirin  81 mg Oral Daily  . aztreonam  2 g Intravenous 3 times per day  . budesonide  0.25 mg Nebulization BID  . [START ON 03/13/2014] carvedilol  3.125 mg Oral BID WC  . coumadin book  1 each Does not apply Once  . diltiazem  300 mg Oral Daily  . feeding supplement (ENSURE)  1 Container Oral TID BM  . fluticasone  2 spray Each Nare Daily  . furosemide  40 mg Oral Daily  . ipratropium   2 puff Inhalation QID  . levalbuterol  1.25 mg Nebulization 4 times per day  . [START ON 03/13/2014] levothyroxine  75 mcg Oral QAC breakfast  . potassium chloride SA  20 mEq Oral Daily  . sodium chloride  3 mL Intravenous Q12H  . tamsulosin  0.4 mg Oral Daily  . warfarin  5 mg Oral Once  . [START ON 03/13/2014] warfarin  1 each Does not apply Once  . [START ON 03/13/2014] Warfarin - Pharmacist Dosing Inpatient   Does not apply q1800    Assessment: 80yo male with AFib with a recently elevated INR with no bleeding.  Coumadin has been on hold for the last 2 days (he thinks).   He is also unsure of what the new dose was to be.  INR on admission is 2.05, Hg 10.7, and pltc wnl.  Pt also to start Vancomycin and Azactam for pneumonia.  He received a dose of Zosyn in ED, no Vancomycin has been given.  Goal of Therapy:  INR 2-3 Monitor platelets by anticoagulation protocol: Yes   Plan:  1-  Coumadin 5mg  2-  Daily INR 3-  Coumadin book and video 4-  Change Azactam to 1gm IV q8 5-  Vancomycin 750mg  IV q24  6-  Watch renal fxn and culture results 7-  Vanco trough at steady state  Marisue HumbleKendra Natha Guin, PharmD Clinical Pharmacist [facility]

## 2014-03-12 NOTE — ED Notes (Signed)
IV team called to start new IV line

## 2014-03-12 NOTE — ED Notes (Signed)
Resident at the bedside

## 2014-03-12 NOTE — ED Notes (Signed)
Per EMS: Patient came from home. Patient just returned from rehab with CHF and Pneumonia. Patient has been home for a few days. Family states patient has been mixing up medication and having episodes of Altered Mental Status. Patient reports having hallucinations. Unable to report whether they are auditory or visual. Patient is normally independent. Upon EMS arrival, patient only complaint is "feeling tired." Patient was taken off of Coumadin a few days ago and will be put on in a few days. Lungs are clear with congestion. Vitals: 98.1, 112/62, 98% on RA, 82 HR.

## 2014-03-12 NOTE — ED Notes (Signed)
Pt returned from Xray. MD at the bedside   

## 2014-03-12 NOTE — ED Notes (Signed)
Transporting patient to new room assignment. 

## 2014-03-12 NOTE — ED Provider Notes (Signed)
CSN: [id number]     Arrival date & time 03/12/14  1545 History   First MD Initiated Contact with Patient 03/12/14 1553     Chief Complaint  Patient presents with  . Altered Mental Status   HPI 80 year old male presents with alternative status.  He was admitted to the hospital recently, about 2-3 weeks ago, for congestive heart failure and pneumonia. He was subsequently discharged to a nursing facility. He came home from the nursing home about a week a half ago. During his initial hospitalization, he had developed delirium. He was having some visual hallucinations with waxing and waning level of consciousness. When he came home from the nursing home a week and a half ago, he was much better. He thinks he was still having some occasional hallucinations at night. But overall he seemed to be improving. Over the past 3-4 days, he has worsened. He is having visual hallucinations. He describes seeing people who come to his house and take him places and don't bring him back. Family reports that he is also reaching out grabbing at objects that aren't there. He often speaks and doesn't make any sense. His level of consciousness waxes and wanes. His symptoms are worse at night. He's also been having generalized weakness. There has been some medication changed recently, when he was discharged from the hospital, however the patient and the family are unable to recall what medication was added. He is on diuretics, beta blocker, and some psychoactive medications as detailed. None of these are new. He is on a narcotic chronically for arthritis.  No fever, chills, URI symptoms, dysuria, cough, abdominal pain, chest pain, shortness of breath, or other new symptoms.   Past Medical History  Diagnosis Date  . CAD (coronary artery disease)   . Hyperlipidemia   . Kidney stones   . BPH (benign prostatic hypertrophy)   . Osteoarthritis   . COPD (chronic obstructive pulmonary disease)   . Hypothyroidism   . Atrial  fibrillation   . Impaired fasting glucose   . Myocardial infarction   . Hypertension   . Shortness of breath   . CHF (congestive heart failure)   . GERD (gastroesophageal reflux disease)    Past Surgical History  Procedure Laterality Date  . Inguinal hernia repair  09/2004    left  . Back surgery  1990's    x3  . Transurethral resection of prostate  2002  . Pleural scarification  1980    right  . Esophagogastroduodenoscopy  04/2002    negative  . Bladder stone removal  06/2006  . Colonoscopy Left 04/13/2013    Procedure: COLONOSCOPY;  Surgeon: Willis ModenaWilliam Outlaw, MD;  [facility];  Service: Endoscopy;  Laterality: Left;   Family History  Problem Relation Age of Onset  . Heart disease Mother   . Heart disease Brother   . Stroke Brother   . Cancer Neg Hx   . Diabetes Neg Hx   . Hypertension Neg Hx   . Heart disease Brother   . Stroke Brother   . Heart disease Brother   . Stroke Brother    History  Substance Use Topics  . Smoking status: Former Smoker -- 1.00 packs/day for 60 years    Types: Cigarettes    Quit date: 12/24/2011  . Smokeless tobacco: Never Used  . Alcohol Use: No    Review of Systems  Constitutional: Positive for fatigue. Negative for fever and chills.  HENT: Negative for congestion and rhinorrhea.   Eyes: Negative  for visual disturbance.  Respiratory: Negative for cough and shortness of breath.   Cardiovascular: Negative for chest pain and leg swelling.  Gastrointestinal: Negative for nausea, vomiting, abdominal pain and diarrhea.  Genitourinary: Negative for dysuria, urgency, frequency, flank pain and difficulty urinating.  Musculoskeletal: Negative for back pain, neck pain and neck stiffness.  Skin: Negative for rash.  Neurological: Positive for weakness. Negative for syncope, numbness and headaches.  Psychiatric/Behavioral: Positive for hallucinations, confusion and sleep disturbance.  All other systems reviewed and are  negative.      Allergies  Fenofibrate; Penicillins; Atorvastatin; and Ezetimibe-simvastatin  Home Medications   No current outpatient prescriptions on file. BP 167/64  Pulse 60  Temp(Src) 97.8 F (36.6 C) (Oral)  Resp 16  Ht 5\' 7"  (1.702 m)  Wt 119 lb 9.6 oz (54.25 kg)  BMI 18.73 kg/m2  SpO2 92% Physical Exam  ED Course  Procedures (including critical care time) Labs Review Labs Reviewed  CBC WITH DIFFERENTIAL - Abnormal; Notable for the following:    RBC 3.61 (*)    Hemoglobin 10.7 (*)    HCT 32.8 (*)    RDW 15.8 (*)    Lymphocytes Relative 9 (*)    Lymphs Abs 0.6 (*)    All other components within normal limits  COMPREHENSIVE METABOLIC PANEL - Abnormal; Notable for the following:    Albumin 2.8 (*)    GFR calc non Af Amer 53 (*)    GFR calc Af Amer 61 (*)    All other components within normal limits  URINALYSIS, ROUTINE W REFLEX MICROSCOPIC - Abnormal; Notable for the following:    APPearance CLOUDY (*)    All other components within normal limits  MAGNESIUM - Abnormal; Notable for the following:    Magnesium 2.7 (*)    All other components within normal limits  TSH  T4, FREE   Imaging Review Ct Head Wo Contrast  03/12/2014   CLINICAL DATA:  Altered mental status, hallucinations  EXAM: CT HEAD WITHOUT CONTRAST  TECHNIQUE: Contiguous axial images were obtained from the base of the skull through the vertex without intravenous contrast.  COMPARISON:  01/21/2014  FINDINGS: No evidence of parenchymal hemorrhage or extra-axial fluid collection. No mass lesion, mass effect, or midline shift.  No CT evidence of acute infarction.  Subcortical white matter and periventricular small vessel ischemic changes. Intracranial atherosclerosis.  Mild global cortical atrophy with secondary ventricular prominence.  The visualized paranasal sinuses are essentially clear. The mastoid air cells are unopacified.  No evidence of calvarial fracture.  IMPRESSION: No evidence of acute  intracranial abnormality.  Atrophy with small vessel ischemic changes and intracranial atherosclerosis   Electronically Signed   By: Charline Bills M.D.   On: 03/12/2014 17:08     EKG Interpretation   Date/Time:  Saturday March 12 2014 15:50:05 EDT Ventricular Rate:  64 PR Interval:  161 QRS Duration: 101 QT Interval:  498 QTC Calculation: 514 R Axis:   71 Text Interpretation:  Sinus rhythm Borderline repolarization abnormality  nscl l Confirmed by Denton Lank  MD, Caryn Bee ([id number]) on 03/12/2014 6:01:54 PM       MDM   80 year old male with history of coronary artery disease, congestive heart failure and, COPD, and medical comorbidities as detailed presents with delirium after her recent admission to the hospital for pneumonia and subsequent stay in a nursing facility. He is having visual hallucinations. He is sundowning. Sleep disturbance is noted by family. His level of consciousness is waxing and waning, depressed at  times.  He is afebrile, has normal vital signs. He is nontoxic appearing. Neurological, cardiopulmonary, abdominal, skin exam unremarkable. Currently he is alert and oriented x4 with no hallucination, no current disordered thinking.  Given his typical symptoms of delirium with waxing and waning level of consciousness, hallucinations, in the setting of recent illness I feel he needs admission for observation and further workup. His ED workup is essentially unremarkable. CT head negative for acute abnormality. EKG is nonischemic. Labs reviewed as detailed above, including hemoglobin 10, normal creatinine, normal electrolytes, normal thyroid tests.  He is admitted to medicine for further management.  Final diagnoses:  Atrial fibrillation  Abnormal involuntary movement  Benign prostatic hypertrophy       Toney Sang, MD 03/13/14 0050

## 2014-03-12 NOTE — ED Notes (Signed)
CBG 80. 

## 2014-03-12 NOTE — H&P (Signed)
[facility] History and Physical  [name] ZOX:[id number] DOB: [birth year] DOA: 03/12/2014  Referring physician: ED physician PCP: Tillman Abideichard Letvak, MD   Chief Complaint: altered mental status   HPI:  80 year old male with COPD,diastolic CHF, atrial fibrillation on Coumadin, recently discharge from SNF after being hospitalized for PNA. He came home from SNF one week ago and per family member has been progressively worse, more confused, poor oral intake. Pt is unable to provide history as he rather somnolent and tired and family who was at bedside earlier provided most of the details. Pt apparently had shortness of breat at rest and with exertion. Family also noted visual hallucinations and altered level of consciousness. No reported fevers, chills, chest pain, no abdominal concerns reported.   In ED, pt hemodynamically stable, CXR worrisome for PNA, pulmonary vascular congestion. Pt started on ABX and TRH asked to admit to telemetry bed for further evaluation.    Assessment and Plan: Active Problems: Altered mental status - this appears to be multifactorial and secondary to possible HCAP vs aspiration PNA, ? Pulmonary vascular congestion, progressive FTT adn deconditioning - will admit to telemetry bed and will start with placing on empiric broad spectrum ABX - will also continue home Lasix as pt is slightly congested on exam, last 2 D ECHO in Jan 2015 with EF 55%  - CT chest ordered for clearer evaluation - once pt more medically stable, will need PT evaluation  PNA - cover broadly, possible HCAP given recent PNA and resident of SNF one week ago - sputum analysis, culture, urine legionella and strep pneumo  - respiratory virus panel ordered  - Ct chest with contrast recommended for clear evaluation of left and right lung opacities, will order  Atrial fibrillation - continue Cardizem and Coumadin per pharmacy - continue amiodarone   - monitor on telemetry  COPD - continue  BD's scheduled and as needed - provide oxygen as needed CHF, chronic and diastolic - on Lasix at home and will continue here as pt sounds slightly congested and suggested by CXR - check BNP, stop IVF  - weight today is 112 lbs - daily weights, strict I's and O's Hypothyroidism - continue synthroid   Radiological Exams on Admission: Dg Chest 2 View   03/12/2014  New left mid and lower lung interstitial opacites - question infection, aspiration or asymmetric edema.  New small bilateral pleural effusions and pulmonary vascular congestion.  Decreased right upper lobe airspace disease but persistent streaky opacity and right perihilar prominence could represent an obstructing central lesion. COPD/emphysema.     Ct Head Wo Contrast   03/12/2014  No evidence of acute intracranial abnormality.  Code Status: Full Family Communication: no family at bedside  Disposition Plan: Admit for further evaluation     Review of Systems:  Unable to obtain due to altered mental status     Past Medical History  Diagnosis Date  . CAD (coronary artery disease)   . Hyperlipidemia   . Kidney stones   . BPH (benign prostatic hypertrophy)   . Osteoarthritis   . COPD (chronic obstructive pulmonary disease)   . Hypothyroidism   . Atrial fibrillation   . Impaired fasting glucose   . Myocardial infarction   . Hypertension   . Shortness of breath   . CHF (congestive heart failure)   . GERD (gastroesophageal reflux disease)     Past Surgical History  Procedure Laterality Date  . Inguinal hernia repair  09/2004    left  . Back  surgery  1990's    x3  . Transurethral resection of prostate  2002  . Pleural scarification  1980    right  . Esophagogastroduodenoscopy  04/2002    negative  . Bladder stone removal  06/2006  . Colonoscopy Left 04/13/2013    Procedure: COLONOSCOPY;  Surgeon: Willis Modena, MD;  Location: [facility] ENDOSCOPY;  Service: Endoscopy;  Laterality: Left;    Social History:  reports that  he quit smoking about 2 years ago. His smoking use included Cigarettes. He has a 60 pack-year smoking history. He has never used smokeless tobacco. He reports that he does not drink alcohol or use illicit drugs.  Allergies  Allergen Reactions  . Fenofibrate Other (See Comments)    REACTION: "ran me up a wall"  . Penicillins Other (See Comments)    blisters  . Atorvastatin Other (See Comments)    REACTION: severe muscle aches  . Ezetimibe-Simvastatin Other (See Comments)    REACTION: severe muscle aches    Family History  Problem Relation Age of Onset  . Heart disease Mother   . Heart disease Brother   . Stroke Brother   . Cancer Neg Hx   . Diabetes Neg Hx   . Hypertension Neg Hx   . Heart disease Brother   . Stroke Brother   . Heart disease Brother   . Stroke Brother     Prior to Admission medications   Medication Sig Start Date End Date Taking? Authorizing Provider  acetaminophen (TYLENOL) 325 MG tablet Take 2 tablets (650 mg total) by mouth every 6 (six) hours as needed for mild pain, fever or headache. 01/25/14  Yes Vassie Loll, MD  albuterol (PROAIR HFA) 108 (90 BASE) MCG/ACT inhaler Inhale 2 puffs into the lungs every 4 (four) hours as needed for wheezing or shortness of breath.   Yes Historical Provider, MD  amiodarone (PACERONE) 200 MG tablet Take 1 tablet (200 mg total) by mouth 2 (two) times daily. 01/12/14  Yes Othella Boyer, MD  amitriptyline (ELAVIL) 25 MG tablet Take 25 mg by mouth at bedtime.   Yes Historical Provider, MD  aspirin 81 MG chewable tablet Chew 1 tablet (81 mg total) by mouth daily. 01/25/14  Yes Vassie Loll, MD  budesonide (PULMICORT) 0.25 MG/2ML nebulizer solution Take 2 mLs (0.25 mg total) by nebulization 2 (two) times daily. 01/25/14  Yes Vassie Loll, MD  carvedilol (COREG) 3.125 MG tablet Take 3.125 mg by mouth 2 (two) times daily with a meal.   Yes Historical Provider, MD  diltiazem (CARDIZEM CD) 300 MG 24 hr capsule Take 1 capsule (300 mg  total) by mouth daily. 04/16/13  Yes Sorin Luanne Bras, MD  feeding supplement, ENSURE, (ENSURE) PUDG Take 1 Container by mouth 3 (three) times daily between meals. 01/25/14  Yes Vassie Loll, MD  fluticasone [facility]) 50 MCG/ACT nasal spray Place 2 sprays into the nose daily. 2 spray, Each Nare, Daily 02/25/13  Yes Elease Etienne, MD  food thickener (THICK IT) POWD As needed to make liquids nectar consistently 01/25/14  Yes Vassie Loll, MD  furosemide (LASIX) 40 MG tablet Take 1 tablet (40 mg total) by mouth daily. 01/25/14  Yes Vassie Loll, MD  guaiFENesin (ROBITUSSIN) 100 MG/5ML SOLN Take 5 mLs by mouth every 4 (four) hours as needed for cough or to loosen phlegm.   Yes Historical Provider, MD  HYDROcodone-acetaminophen (NORCO) 10-325 MG per tablet Take 1 tablet by mouth every 8 (eight) hours as needed for severe pain. 01/25/14  Yes Vassie Loll, MD  ipratropium (ATROVENT HFA) 17 MCG/ACT inhaler Inhale 2 puffs into the lungs 4 (four) times daily. 11/02/13  Yes Karie Schwalbe, MD  levothyroxine (SYNTHROID, LEVOTHROID) 75 MCG tablet Take 75 mcg by mouth daily before breakfast.   Yes Historical Provider, MD  potassium chloride SA (K-DUR,KLOR-CON) 20 MEQ tablet Take 1 tablet (20 mEq total) by mouth daily. 04/16/13  Yes Karie Schwalbe, MD  RAPAFLO 8 MG CAPS Take 8 mg by mouth daily.  01/11/11  Yes Historical Provider, MD  senna-docusate (SENOKOT-S) 8.6-50 MG per tablet Take 2 tablets by mouth at bedtime as needed for mild constipation. 01/26/14  Yes Sharee Holster, NP  warfarin (COUMADIN) 5 MG tablet Take 2.5-5 mg by mouth every morning. Takes 2.5mg  on Tues, Thurs, Sat  Takes 5mg  all other days 01/27/14  Yes Vassie Loll, MD  nitroGLYCERIN (NITROSTAT) 0.4 MG SL tablet Place 0.4 mg under the tongue every 5 (five) minutes as needed for chest pain. x3 doses for for chest pain 10/19/12   Karie Schwalbe, MD    Physical Exam: Filed Vitals:   03/12/14 1552 03/12/14 1630 03/12/14 1800 03/12/14 2014  BP: 136/65  134/55 133/89 124/100  Pulse: 64 63 64 64  Temp:    97.9 F (36.6 C)  TempSrc:    Oral  Resp: 17 17 19 16   SpO2: 100% 93% 96% 100%    Physical Exam  Constitutional: Appears chronically ill, NAD HENT: Normocephalic. External right and left ear normal. Oropharynx is clear and moist.  Eyes: Conjunctivae and EOM are normal. PERRLA, no scleral icterus.  Neck: Normal ROM. Neck supple. No JVD. No tracheal deviation. No thyromegaly.  CVS: RRR, S1/S2 +, no murmurs, no gallops, no carotid bruit.  Pulmonary: Bilateral rhonchi with crackles at bases  Abdominal: Soft. BS +,  no distension, tenderness, rebound or guarding.  Musculoskeletal: Normal range of motion. +1 bilateral LE pitting edema and no tenderness.  Lymphadenopathy: No lymphadenopathy noted, cervical, inguinal. Neuro: Somnolent but easy to arouse and follows commands appropriately  Skin: Skin is warm and dry. No rash noted. Not diaphoretic. No erythema. No pallor.  Psychiatric: Difficult to assess as pt is somnolent   Labs on Admission:  Basic Metabolic Panel:  Recent Labs Lab 03/12/14 1648  NA 139  K 4.5  CL 102  CO2 26  GLUCOSE 82  BUN 15  CREATININE 1.22  CALCIUM 9.1  MG 2.7*   Liver Function Tests:  Recent Labs Lab 03/12/14 1648  AST 20  ALT 15  ALKPHOS 91  BILITOT 0.3  PROT 7.2  ALBUMIN 2.8*   CBC:  Recent Labs Lab 03/12/14 1648  WBC 7.3  NEUTROABS 5.7  HGB 10.7*  HCT 32.8*  MCV 90.9  PLT 281   CBG:  Recent Labs Lab 03/12/14 1640  GLUCAP 78    EKG: Normal sinus rhythm, no ST/T wave changes  Debbora Presto, MD  [facility] Pager [phone]  If 7PM-7AM, please contact night-coverage [email or web address] Password [facility] 03/12/2014, 8:19 PM      \

## 2014-03-13 DIAGNOSIS — N183 Chronic kidney disease, stage 3 unspecified: Secondary | ICD-10-CM

## 2014-03-13 DIAGNOSIS — R1310 Dysphagia, unspecified: Secondary | ICD-10-CM

## 2014-03-13 DIAGNOSIS — I5032 Chronic diastolic (congestive) heart failure: Secondary | ICD-10-CM

## 2014-03-13 DIAGNOSIS — E039 Hypothyroidism, unspecified: Secondary | ICD-10-CM

## 2014-03-13 DIAGNOSIS — J189 Pneumonia, unspecified organism: Secondary | ICD-10-CM

## 2014-03-13 DIAGNOSIS — G9340 Encephalopathy, unspecified: Secondary | ICD-10-CM

## 2014-03-13 DIAGNOSIS — Z7901 Long term (current) use of anticoagulants: Secondary | ICD-10-CM

## 2014-03-13 LAB — BASIC METABOLIC PANEL
BUN: 13 mg/dL (ref 6–23)
CO2: 22 mEq/L (ref 19–32)
CREATININE: 1.08 mg/dL (ref 0.50–1.35)
Calcium: 9 mg/dL (ref 8.4–10.5)
Chloride: 103 mEq/L (ref 96–112)
GFR calc Af Amer: 71 mL/min — ABNORMAL LOW (ref >90)
GFR calc non Af Amer: 61 mL/min — ABNORMAL LOW (ref >90)
Glucose, Bld: 88 mg/dL (ref 70–99)
Potassium: 3.9 mEq/L (ref 3.7–5.3)
Sodium: 138 mEq/L (ref 137–147)

## 2014-03-13 LAB — PRO B NATRIURETIC PEPTIDE: Pro B Natriuretic peptide (BNP): 1065 pg/mL — ABNORMAL HIGH (ref 0–450)

## 2014-03-13 LAB — CBC
HEMATOCRIT: 29.7 % — AB (ref 39.0–52.0)
Hemoglobin: 9.6 g/dL — ABNORMAL LOW (ref 13.0–17.0)
MCH: 29.2 pg (ref 26.0–34.0)
MCHC: 32.3 g/dL (ref 30.0–36.0)
MCV: 90.3 fL (ref 78.0–100.0)
Platelets: 251 10*3/uL (ref 150–400)
RBC: 3.29 MIL/uL — ABNORMAL LOW (ref 4.22–5.81)
RDW: 16 % — ABNORMAL HIGH (ref 11.5–15.5)
WBC: 7.1 10*3/uL (ref 4.0–10.5)

## 2014-03-13 LAB — STREP PNEUMONIAE URINARY ANTIGEN: Strep Pneumo Urinary Antigen: NEGATIVE

## 2014-03-13 LAB — PROTIME-INR
INR: 2.05 — ABNORMAL HIGH (ref 0.00–1.49)
Prothrombin Time: 22.5 seconds — ABNORMAL HIGH (ref 11.6–15.2)

## 2014-03-13 LAB — TSH: TSH: 53.521 u[IU]/mL — ABNORMAL HIGH (ref 0.350–4.500)

## 2014-03-13 LAB — T4, FREE: FREE T4: 0.71 ng/dL — AB (ref 0.80–1.80)

## 2014-03-13 MED ORDER — METRONIDAZOLE IN NACL 5-0.79 MG/ML-% IV SOLN
500.0000 mg | Freq: Three times a day (TID) | INTRAVENOUS | Status: DC
  Administered 2014-03-13 – 2014-03-17 (×13): 500 mg via INTRAVENOUS
  Filled 2014-03-13 (×15): qty 100

## 2014-03-13 MED ORDER — WARFARIN SODIUM 5 MG PO TABS
5.0000 mg | ORAL_TABLET | Freq: Once | ORAL | Status: AC
  Administered 2014-03-13: 5 mg via ORAL
  Filled 2014-03-13: qty 1

## 2014-03-13 MED ORDER — LEVALBUTEROL HCL 1.25 MG/0.5ML IN NEBU
0.6300 mg | INHALATION_SOLUTION | Freq: Four times a day (QID) | RESPIRATORY_TRACT | Status: DC
  Administered 2014-03-13 – 2014-03-14 (×3): 0.63 mg via RESPIRATORY_TRACT
  Administered 2014-03-14: 03:00:00 via RESPIRATORY_TRACT
  Administered 2014-03-14 (×2): 0.63 mg via RESPIRATORY_TRACT
  Filled 2014-03-13 (×8): qty 0.26

## 2014-03-13 NOTE — Progress Notes (Signed)
Noted inconsistency with last few MD notes and our medication history for patient's Synthroid dose.  Notes from [facility] have Synthroid 25mcg as listed dose from notes on 3/5 and 3/14. However note from Synthia Innocenteborah Green NP on 3/5 states "Hypothyroidism: his tsh is very high; will increase synthroid to 75 mcg daily and will check tsh free t3 and free t4 in 6 week. Will monitor his status"  Spoke with [facility] on file- the last Synthroid prescription they filled for the patient was for 25mcg (1 tablet daily) in January. They did receive the prescription for increase to 75mcg. This was placed on hold on 3/12 because it was not picked up. Since prescriptions aren't placed on hold for at least a week after being received, this is likely the same prescription that was mentioned in [employer]orah Green's note.   Note patient is very confused and it is possible he was not taking the newest Synthroid dose. Agree with continuing 75mcg and checking TSH in 4-6 weeks from this admission.  Clements Toro D. Ariadne Rissmiller, PharmD, BCPS Clinical Pharmacist 03/13/2014 11:51 AM

## 2014-03-13 NOTE — Progress Notes (Signed)
Discussed with family. Multiple admissions to hospital and SNF within the past year. Suspect recurrent aspiration pneumonia.  Only lasted 1 week at home. Prognosis guarded. They would like to discuss GOC with PCT.  Will consult.  Crista Curborinna Annelle Behrendt, M.D.

## 2014-03-13 NOTE — Progress Notes (Signed)
Current, old, and outpatient records reviewed.   [facility] PROGRESS NOTE  Lazarus GowdaDanah Ossa WUJ:[id number] DOB: 1928-07-25 DOA: 03/12/2014 PCP: Tillman Abideichard Letvak, MD  Assessment/Plan:  Principal Problem:   Acute encephalopathy: likely from infection and hypothyroid state.  Continue antibiotics.  Discharge summaries over the past year report pt on synthroid 25 mcg.  Home meds list 75 mcg, but I don't see any recent notes about dose change. Have asked pharmacy to clarify. Continue 75 mcg for now.  Patient has had multiple recent admissions, [facility] Only at home for about 1 week. May need permanent placement. Will consult SW. And CM Active Problems:   HCAP (healthcare-associated pneumonia) v. Aspiration: CT confirms new RUL infiltrate. Has h/o dysphagia, and was most recently discharged from the hospital on dysphagia 2 diet with nectar thickened liquids. We'll change back to modified diet and consult speech. Strict aspiration precautions. Patient has a penicillin allergy. Currently is on vancomycin and aztreonam. Will add Flagyl for aerobic coverage.   Chronic diastolic heart failure: ProBNP is about 1000 which is actually lower than previous. Continue heart failure medications and avoid additional IV fluid.     CAD (coronary artery disease)   Atrial fibrillation   COPD   Benign prostatic hypertrophy   Hypothyroidism: TSH elevated. See above discussion.   Chronic kideny disease stage III  (GFR 30-59 ml/min)   Dysphagia, unspecified(787.20)   Essential hypertension, benign  Consult physical therapy  Code Status:  full Family Communication:   Disposition Plan:  ?  Consultants:    Procedures:     Antibiotics:  Vancomycin 3/21 -   aztreonam 3/21 -  Metronidazole 3/22 -  HPI/Subjective: "terrible"  Objective: Filed Vitals:   03/13/14 0538  BP: 141/68  Pulse: 1  Temp: 97.2 F (36.2 C)  Resp: 16    Intake/Output Summary (Last 24 hours) at 03/13/14  1048 Last data filed at 03/13/14 0826  Gross per 24 hour  Intake    120 ml  Output    925 ml  Net   -805 ml   Filed Weights   03/12/14 2048 03/13/14 0538  Weight: 54.25 kg (119 lb 9.6 oz) 54.795 kg (120 lb 12.8 oz)    Exam:   General:   asleep. Arousable. Confused. Difficult to understand and falls back asleep quickly.   Cardiovascular:  regular rate and rhythm without murmurs gallops or rubs   Respiratory:  rhonchi bilaterally no wheezes or rales.   Abdomen: S, NT, ND  Ext: no CCE  Basic Metabolic Panel:  Recent Labs Lab 03/12/14 1648 03/13/14 0620  NA 139 138  K 4.5 3.9  CL 102 103  CO2 26 22  GLUCOSE 82 88  BUN 15 13  CREATININE 1.22 1.08  CALCIUM 9.1 9.0  MG 2.7*  --    Liver Function Tests:  Recent Labs Lab 03/12/14 1648  AST 20  ALT 15  ALKPHOS 91  BILITOT 0.3  PROT 7.2  ALBUMIN 2.8*   No results found for this basename: LIPASE, AMYLASE,  in the last 168 hours No results found for this basename: AMMONIA,  in the last 168 hours CBC:  Recent Labs Lab 03/12/14 1648 03/13/14 0620  WBC 7.3 7.1  NEUTROABS 5.7  --   HGB 10.7* 9.6*  HCT 32.8* 29.7*  MCV 90.9 90.3  PLT 281 251   Cardiac Enzymes: No results found for this basename: CKTOTAL, CKMB, CKMBINDEX, TROPONINI,  in the last 168 hours BNP (last 3 results)  Recent Labs  01/08/14 2225 01/20/14 2159 03/13/14  PROBNP 1259.0* 1528.0* 1065.0*   TSH 53 Free T4 0.71  CBG:  Recent Labs Lab 03/12/14 1640  GLUCAP 78    Recent Results (from the past 240 hour(s))  GRAM STAIN     Status: None   Collection Time    03/12/14  5:45 PM      Result Value Ref Range Status   Specimen Description URINE, RANDOM   Final   Special Requests NONE   Final   Gram Stain     Final   Value: CYTOSPIN SLIDE     WBC PRESENT, PREDOMINANTLY MONONUCLEAR     GRAM NEGATIVE RODS     BACTERIA ATTACHED TO EPITHELIAL CELLS   Report Status 03/12/2014 FINAL   Final     Studies: Dg Chest 2 View  03/12/2014    CLINICAL DATA:  80 year old male with altered mental status.  EXAM: CHEST  2 VIEW  COMPARISON:  01/20/2014 and prior chest radiographs.  FINDINGS: There has been significant decrease and right upper lobe airspace disease since 01/20/2014, but persistent streaky and right perihilar opacity identified. An obstructing central lesion is difficult to exclude and consider chest CT with contrast for further evaluation.  Small bilateral pleural effusions are now noted.  Pulmonary vascular congestion is present.  New interstitial opacities within the left mid and lower lung are noted -question infection, aspiration or interstitial edema.  COPD/emphysema again identified with a right lower lobe bulla.  There is no evidence pneumothorax or acute bony abnormality.  IMPRESSION: New left mid and lower lung interstitial opacites - question infection, aspiration or asymmetric edema.  New small bilateral pleural effusions and pulmonary vascular congestion.  Decreased right upper lobe airspace disease but persistent streaky opacity and right perihilar prominence could represent an obstructing central lesion. Consider chest CT with contrast for further evaluation.  COPD/emphysema.   Electronically Signed   By: Laveda Abbe M.D.   On: 03/12/2014 18:39   Ct Head Wo Contrast  03/12/2014   CLINICAL DATA:  Altered mental status, hallucinations  EXAM: CT HEAD WITHOUT CONTRAST  TECHNIQUE: Contiguous axial images were obtained from the base of the skull through the vertex without intravenous contrast.  COMPARISON:  01/21/2014  FINDINGS: No evidence of parenchymal hemorrhage or extra-axial fluid collection. No mass lesion, mass effect, or midline shift.  No CT evidence of acute infarction.  Subcortical white matter and periventricular small vessel ischemic changes. Intracranial atherosclerosis.  Mild global cortical atrophy with secondary ventricular prominence.  The visualized paranasal sinuses are essentially clear. The mastoid air cells are  unopacified.  No evidence of calvarial fracture.  IMPRESSION: No evidence of acute intracranial abnormality.  Atrophy with small vessel ischemic changes and intracranial atherosclerosis   Electronically Signed   By: Charline Bills M.D.   On: 03/12/2014 17:08   Ct Chest W Contrast  03/13/2014   CLINICAL DATA:  Short of breath  EXAM: CT CHEST WITH CONTRAST  TECHNIQUE: Multidetector CT imaging of the chest was performed during intravenous contrast administration.  CONTRAST:  80mL OMNIPAQUE IOHEXOL 300 MG/ML  SOLN  COMPARISON:  DG CHEST 2 VIEW dated 03/12/2014; CT CHEST W/O CM dated 02/23/2013  FINDINGS: There is new consolidative airspace disease within the right middle lobe. There is mild airspace disease in the lingula. There is severe emphysematous change throughout the lungs with bullous change the lung bases. There is a partially loculated left pleural effusion. There is left basilar atelectasis versus consolidation. Left lower  lobe atelectasis is similar to prior as well as the fusion. . The upper lobe airspace disease is new.  No axillary or supraclavicular lymphadenopathy. No mediastinal hilar lymphadenopathy. No pericardial fluid. Coronary calcifications are present. Esophagus is normal.  No pulmonary embolism.  There is thickening of the left adrenal gland to 16 mm which is similar prior. No aggressive osseous lesion.  IMPRESSION: 1. New consolidated airspace disease in the right upper lobe is likely infectious or inflammatory however cannot exclude a neoplasm. Recommend follow-up CT in 1 to 3 months. 2. Ground-glass opacities in the upper lobes likely represent pulmonary edema. 3. Chronic left pleural effusion and basilar atelectasis.   Electronically Signed   By: Genevive Bi M.D.   On: 03/13/2014 00:11    Scheduled Meds: . amiodarone  200 mg Oral BID  . amitriptyline  25 mg Oral QHS  . aspirin  81 mg Oral Daily  . aztreonam  1 g Intravenous 3 times per day  . budesonide  0.25 mg Nebulization  BID  . carvedilol  3.125 mg Oral BID WC  . coumadin book  1 each Does not apply Once  . diltiazem  300 mg Oral Daily  . feeding supplement (ENSURE)  1 Container Oral TID BM  . fluticasone  2 spray Each Nare Daily  . furosemide  40 mg Oral Daily  . ipratropium  2.5 mL Inhalation Q6H  . levalbuterol  1.25 mg Nebulization 4 times per day  . levothyroxine  75 mcg Oral QAC breakfast  . potassium chloride SA  20 mEq Oral Daily  . sodium chloride  3 mL Intravenous Q12H  . tamsulosin  0.4 mg Oral Daily  . vancomycin  750 mg Intravenous Q24H  . warfarin  1 each Does not apply Once  . Warfarin - Pharmacist Dosing Inpatient   Does not apply q1800   Continuous Infusions:   Time spent: 35 minutes  Kiwana Deblasi L  [facility] Pager [phone]. If 7PM-7AM, please contact night-coverage at [email or web address], password [facility] 03/13/2014, 10:48 AM  LOS: 1 day

## 2014-03-13 NOTE — Progress Notes (Addendum)
ANTICOAGULATION CONSULT NOTE - Follow Up Consult  Pharmacy Consult for warfarin Indication: atrial fibrillation  Allergies  Allergen Reactions  . Fenofibrate Other (See Comments)    REACTION: "ran me up a wall"  . Penicillins Other (See Comments)    blisters  . Atorvastatin Other (See Comments)    REACTION: severe muscle aches  . Ezetimibe-Simvastatin Other (See Comments)    REACTION: severe muscle aches    Patient Measurements: Height: 5\' 7"  (170.2 cm) Weight: 120 lb 12.8 oz (54.795 kg) IBW/kg (Calculated) : 66.1  Vital Signs: Temp: 97.2 F (36.2 C) (03/22 0538) Temp src: Oral (03/22 0538) BP: 141/68 mmHg (03/22 0538) Pulse Rate: 1 (03/22 0538)  Labs:  Recent Labs  03/11/14 1438 03/12/14 1648 03/13/14 0620  HGB  --  10.7* 9.6*  HCT  --  32.8* 29.7*  PLT  --  281 251  LABPROT  --  22.5* 22.5*  INR 4.5 2.05* 2.05*  CREATININE  --  1.22 1.08    Estimated Creatinine Clearance: 40.2 ml/min (by C-G formula based on Cr of 1.08).   Medications:  Scheduled:  . amiodarone  200 mg Oral BID  . amitriptyline  25 mg Oral QHS  . aspirin  81 mg Oral Daily  . aztreonam  1 g Intravenous 3 times per day  . budesonide  0.25 mg Nebulization BID  . carvedilol  3.125 mg Oral BID WC  . coumadin book  1 each Does not apply Once  . diltiazem  300 mg Oral Daily  . feeding supplement (ENSURE)  1 Container Oral TID BM  . fluticasone  2 spray Each Nare Daily  . furosemide  40 mg Oral Daily  . ipratropium  2.5 mL Inhalation Q6H  . levalbuterol  0.63 mg Nebulization 4 times per day  . levothyroxine  75 mcg Oral QAC breakfast  . metronidazole  500 mg Intravenous Q8H  . potassium chloride SA  20 mEq Oral Daily  . sodium chloride  3 mL Intravenous Q12H  . tamsulosin  0.4 mg Oral Daily  . vancomycin  750 mg Intravenous Q24H  . warfarin  1 each Does not apply Once  . Warfarin - Pharmacist Dosing Inpatient   Does not apply q1800    Assessment: [age] YOM admitted with encephalopathy.  Had a recently elevated INR without bleeding and warfarin was on hold for 2 days (he thinks). He was unsure of what new dosing plan was. Previous warfarin dose was 5mg  daily except 2.5mg  on Tuesdays, Thursdays and Saturdays.  Noted patient was also on amiodarone and Synthroid which can both potentiate INR. Flagyl was added today which also can increase INR. INR yesterday on admission therapeutic at 2.05. Given 5mg  last evening. INR this morning remained the same at 2.05. Hgb and plts are low but stable. No bleeding noted.  Goal of Therapy:  INR 2-3 Monitor platelets by anticoagulation protocol: Yes   Plan:  1. Warfarin 5mg  po x1 tonight 2. Daily PT/INR 3. Will provide education for patient (Coumadin book has already been sent) once disposition planning is more clear. May need to education patient and a family member prior to discharge.  Bunnie Lederman D. Mikael Skoda, PharmD, BCPS Clinical Pharmacist Pager: [phone] 03/13/2014 12:01 PM

## 2014-03-13 NOTE — Progress Notes (Signed)
Patient ZO:XWRUE:[name]      DOB: [birth year]      AVW:[id number]  Daughter returned call requested Goals of Care Tuesday at 330 pm.  Not willing to meet sooner.  Wants spouse to be present. Told her that if care team required us to talk sooner would need to reconsider timing.  [name] Deveney L. Ladona Ridgelaylor, MD MBA The Palliative Medicine Team at [facility] Team Phone: [phone] Pager: [phone]

## 2014-03-13 NOTE — Progress Notes (Signed)
Patient ZH:[id number]:[name]      DOB: 11-13-1928      [id number]  Message left for daughter to call me back to schedule a time to meet.  Ibn Stief L. Ladona Ridgelaylor, MD MBA The Palliative Medicine Team at [facility] Team Phone: [phone] Pager: [phone]

## 2014-03-13 NOTE — ED Provider Notes (Addendum)
I saw and evaluated the patient, reviewed the resident's note and I agree with the findings and plan.   EKG Interpretation   Date/Time:  Saturday March 12 2014 15:50:05 EDT Ventricular Rate:  64 PR Interval:  161 QRS Duration: 101 QT Interval:  498 QTC Calculation: 514 R Axis:   71 Text Interpretation:  Sinus rhythm Borderline repolarization abnormality  nscl l Confirmed by Denton LankSTEINL  MD, Caryn BeeKEVIN ([id number]) on 03/12/2014 6:01:54 PM      BP 105/53  Pulse 61  Temp(Src) 98.6 F (37 C) (Oral)  Resp 16  Ht 5\' 7"  (1.702 m)  Wt 122 lb 3.2 oz (55.43 kg)  BMI 19.13 kg/m2  SpO2 93% Physical Exam  Nursing note and vitals reviewed. Constitutional: He appears well-developed and well-nourished. No distress.  HENT:  Head: Atraumatic.  Mouth/Throat: Oropharynx is clear and moist.  Eyes: Conjunctivae are normal. Pupils are equal, round, and reactive to light. No scleral icterus.  Neck: Neck supple. No tracheal deviation present. No thyromegaly present.  No stiffness or rigidity. No bruit.  Cardiovascular: Normal rate, normal heart sounds and intact distal pulses.   Pulmonary/Chest: Effort normal and breath sounds normal. No accessory muscle usage. No respiratory distress.  Abdominal: Soft. He exhibits no distension. There is no tenderness.  Genitourinary:  No cva tenderness  Musculoskeletal: Normal range of motion. He exhibits no edema and no tenderness.  Neurological: He is alert. No cranial nerve deficit.  Oriented to person, place. Moves bil ext purposefully   Skin: Skin is warm and dry. No rash noted. He is not diaphoretic.  Psychiatric: He has a normal mood and affect.   Pt with altered mental status, confusion, gen weakness. Pt alert, speech clear. Motor intact bil. Chest cta. Labs.   Suzi RootsKevin E Perley Arthurs, MD 03/13/14 1809  Suzi RootsKevin E Evo Aderman, MD 03/21/14 [phone]

## 2014-03-14 ENCOUNTER — Ambulatory Visit: Payer: Medicare Other | Admitting: Internal Medicine

## 2014-03-14 ENCOUNTER — Ambulatory Visit: Payer: Medicare Other

## 2014-03-14 ENCOUNTER — Telehealth: Payer: Self-pay

## 2014-03-14 LAB — LEGIONELLA ANTIGEN, URINE: Legionella Antigen, Urine: NEGATIVE

## 2014-03-14 LAB — PROTIME-INR
INR: 2.45 — ABNORMAL HIGH (ref 0.00–1.49)
Prothrombin Time: 25.8 seconds — ABNORMAL HIGH (ref 11.6–15.2)

## 2014-03-14 MED ORDER — WARFARIN SODIUM 2.5 MG PO TABS
2.5000 mg | ORAL_TABLET | Freq: Once | ORAL | Status: AC
  Administered 2014-03-14: 2.5 mg via ORAL
  Filled 2014-03-14: qty 1

## 2014-03-14 MED ORDER — IPRATROPIUM BROMIDE 0.02 % IN SOLN
2.5000 mL | Freq: Three times a day (TID) | RESPIRATORY_TRACT | Status: DC
  Administered 2014-03-14 – 2014-03-16 (×5): 0.5 mg via RESPIRATORY_TRACT
  Filled 2014-03-14 (×4): qty 2.5

## 2014-03-14 MED ORDER — LEVALBUTEROL HCL 0.63 MG/3ML IN NEBU
0.6300 mg | INHALATION_SOLUTION | Freq: Three times a day (TID) | RESPIRATORY_TRACT | Status: DC
  Administered 2014-03-15 – 2014-03-16 (×4): 0.63 mg via RESPIRATORY_TRACT
  Filled 2014-03-14 (×8): qty 3

## 2014-03-14 MED ORDER — LEVALBUTEROL HCL 1.25 MG/0.5ML IN NEBU
0.6300 mg | INHALATION_SOLUTION | Freq: Three times a day (TID) | RESPIRATORY_TRACT | Status: DC
  Filled 2014-03-14 (×2): qty 0.26

## 2014-03-14 MED ORDER — RESOURCE THICKENUP CLEAR PO POWD
ORAL | Status: DC | PRN
  Filled 2014-03-14: qty 125

## 2014-03-14 NOTE — Telephone Encounter (Signed)
Okay to approve but he was just readmitted to the hospital on 3/21

## 2014-03-14 NOTE — Evaluation (Signed)
Physical Therapy Evaluation Patient Details Name: [name] MRN: [id number] DOB: [birth year] Today's Date: 03/14/2014 Time: 7829-5621 PT Time Calculation (min): 26 min  PT Assessment / Plan / Recommendation History of Present Illness  80 year old male with COPD,diastolic CHF, atrial fibrillation on Coumadin, recently discharge from SNF after being hospitalized for PNA. He came home from SNF one week ago and per family member has been progressively worse, more confused, poor oral intake. Pt is unable to provide history as he rather somnolent and tired and family who was at bedside earlier provided most of the details. Pt apparently had shortness of breat at rest and with exertion. Family also noted visual hallucinations and altered level of consciousness. No reported fevers, chills, chest pain, no abdominal concerns reported.   Clinical Impression   Pt admitted with above, questionable aspiration pneumonia, and with recurrent hospital admissions. Pt currently with functional limitations due to the deficits listed below (see PT Problem List).  Pt will benefit from skilled PT to increase their independence and safety with mobility to allow discharge to the venue listed below.       PT Assessment  Patient needs continued PT services    Follow Up Recommendations  Other (comment) From a pure functional mobility standpoint, discharge back home with HHPT/OT follow-up is reasonable if pt has 24 hour supervision/assistance available; Still, noted family is having difficulty caring for pt, and he has had recurrent admissions including recent SNF stay for rehab  A higher level of care is worth consideration for [name]; Noted also for Palliative Care Meeting tomorrow for goals of care -- Will plan to take Palliative Care Team's lead    Does the patient have the potential to tolerate intense rehabilitation      Barriers to Discharge Decreased caregiver support Apparently family is having a  difficult time caring for pt, with multiple hospital admissions recently    Equipment Recommendations  3in1 (PT) (Need more info re: equipment pt has at home)    Recommendations for Other Services OT consult   Frequency Min 3X/week    Precautions / Restrictions Precautions Precautions: Fall Restrictions Weight Bearing Restrictions: No   Pertinent Vitals/Pain No pain reported Pt also reported no shortness of breath when asked, however O2 sats were 85% post amb on Room Air Incr back to acceptable levels with breathing treatment administered by Respiratory Therapist      Mobility  Bed Mobility General bed mobility comments: Was sitting EOB upon this PT's arrival Transfers Overall transfer level: Needs assistance Equipment used: Rolling walker (2 wheeled) Transfers: Sit to/from Stand Sit to Stand: Min assist General transfer comment: Cues for safety and hand placement Ambulation/Gait Ambulation/Gait assistance: Min guard Ambulation Distance (Feet): 200 Feet Assistive device: Rolling walker (2 wheeled) Gait Pattern/deviations: Step-through pattern;Trunk flexed Gait velocity: slow General Gait Details: Slow gait, but steady with RW; Cues for upright posture, and specifically for scapular retraction and deep breathing; Reported "no" when asked if he was experiencing shortness of breath    Exercises     PT Diagnosis: Difficulty walking;Generalized weakness  PT Problem List: Decreased strength;Decreased activity tolerance;Decreased balance;Decreased mobility;Cardiopulmonary status limiting activity PT Treatment Interventions: DME instruction;Gait training;Stair training;Functional mobility training;Therapeutic activities;Therapeutic exercise;Balance training;Patient/family education     PT Goals(Current goals can be found in the care plan section) Acute Rehab PT Goals Patient Stated Goal: Agreeable to amb; did not state a specific goal PT Goal Formulation: With patient Time For  Goal Achievement: 03/28/14 Potential to Achieve Goals: Good  Visit Information  Last PT Received On: 03/14/14 Assistance Needed: +1 History of Present Illness: 80 year old male with COPD,diastolic CHF, atrial fibrillation on Coumadin, recently discharge from SNF after being hospitalized for PNA. He came home from SNF one week ago and per family member has been progressively worse, more confused, poor oral intake. Pt is unable to provide history as he rather somnolent and tired and family who was at bedside earlier provided most of the details. Pt apparently had shortness of breat at rest and with exertion. Family also noted visual hallucinations and altered level of consciousness. No reported fevers, chills, chest pain, no abdominal concerns reported.        Prior Functioning  Home Living Family/patient expects to be discharged to:: Private residence Living Arrangements: Spouse/significant other Available Help at Discharge: Family;Available 24 hours/day Type of Home: House Home Access: Stairs to enter Entergy CorporationEntrance Stairs-Number of Steps: 5-6 Entrance Stairs-Rails: Can reach both Home Layout: One level Home Equipment: Environmental consultantWalker - 2 wheels Additional Comments: Obtained information from chart Prior Function Level of Independence: Needs assistance Gait / Transfers Assistance Needed: uses RW Comments: Judging by recent hospitalizations and SNF stay for rehab followed by a week at home where he had a functional decline, it is likely he needed assist for mobility and ADLs Communication Communication: No difficulties    Cognition  Cognition Arousal/Alertness: Awake/alert Behavior During Therapy: WFL for tasks assessed/performed Overall Cognitive Status: Within Functional Limits for tasks assessed    Extremity/Trunk Assessment Upper Extremity Assessment Upper Extremity Assessment: Overall WFL for tasks assessed (for simple mobility tasks) Lower Extremity Assessment Lower Extremity Assessment:  Generalized weakness   Balance    End of Session PT - End of Session Activity Tolerance: Other (comment) (Decr O2 sats with amb on Room air) Patient left: in chair;with call bell/phone within reach;Other (comment) (With Respiratory Therapist)  GP     Van ClinesGarrigan, Laylah Riga Hamff 03/14/2014, 12:07 PM  Van ClinesHolly Gillis Boardley, PT  Acute Rehabilitation Services Pager [phone] Office [phone]

## 2014-03-14 NOTE — Telephone Encounter (Signed)
Amy OT with [facility] left v/m requesting verbal orders for OT home health 2 x a week for 4 weeks. Please advise.

## 2014-03-14 NOTE — Progress Notes (Signed)
ANTICOAGULATION CONSULT NOTE - Follow Up Consult  Pharmacy Consult for warfarin Indication: atrial fibrillation  Allergies  Allergen Reactions  . Fenofibrate Other (See Comments)    REACTION: "ran me up a wall"  . Penicillins Other (See Comments)    blisters  . Atorvastatin Other (See Comments)    REACTION: severe muscle aches  . Ezetimibe-Simvastatin Other (See Comments)    REACTION: severe muscle aches    Patient Measurements: Height: 5\' 7"  (170.2 cm) Weight: 119 lb 0.3 oz (53.987 kg) IBW/kg (Calculated) : 66.1  Vital Signs: Temp: 97.4 F (36.3 C) (03/23 0639) BP: 115/53 mmHg (03/23 0639) Pulse Rate: 57 (03/23 0639)  Labs:  Recent Labs  03/12/14 1648 03/13/14 0620 03/14/14 0622  HGB 10.7* 9.6*  --   HCT 32.8* 29.7*  --   PLT 281 251  --   LABPROT 22.5* 22.5* 25.8*  INR 2.05* 2.05* 2.45*  CREATININE 1.22 1.08  --     Estimated Creatinine Clearance: 39.6 ml/min (by C-G formula based on Cr of 1.08).   Medications:  Scheduled:  . amiodarone  200 mg Oral BID  . amitriptyline  25 mg Oral QHS  . aspirin  81 mg Oral Daily  . aztreonam  1 g Intravenous 3 times per day  . budesonide  0.25 mg Nebulization BID  . carvedilol  3.125 mg Oral BID WC  . coumadin book  1 each Does not apply Once  . diltiazem  300 mg Oral Daily  . feeding supplement (ENSURE)  1 Container Oral TID BM  . fluticasone  2 spray Each Nare Daily  . furosemide  40 mg Oral Daily  . ipratropium  2.5 mL Inhalation Q6H  . levalbuterol  0.63 mg Nebulization 4 times per day  . levothyroxine  75 mcg Oral QAC breakfast  . metronidazole  500 mg Intravenous Q8H  . potassium chloride SA  20 mEq Oral Daily  . sodium chloride  3 mL Intravenous Q12H  . tamsulosin  0.4 mg Oral Daily  . vancomycin  750 mg Intravenous Q24H  . warfarin  1 each Does not apply Once  . Warfarin - Pharmacist Dosing Inpatient   Does not apply q1800    Assessment: [age] YOM admitted with encephalopathy. Had a recently elevated  INR without bleeding and warfarin was on hold for 2 days (he thinks). He was unsure of what new dosing plan was. Previous warfarin dose was 5mg  daily except 2.5mg  on Tuesdays, Thursdays and Saturdays.  Noted patient was also on amiodarone and Synthroid which can both potentiate INR. Flagyl was added yesterday which also can increase INR. INR on admission therapeutic at 2.05. Has received 5mg   Po x2. INR this morning 2.45. Hgb and plts are low but stable. No bleeding noted.  Goal of Therapy:  INR 2-3 Monitor platelets by anticoagulation protocol: Yes   Plan:  1. Warfarin 2.5mg  po x1 tonight 2. Daily PT/INR 3. Will provide education for patient once disposition planning is more clear. Likely will need to education patient and a family member prior to discharge.  Renesme Kerrigan D. Aalaiyah Yassin, PharmD, BCPS Clinical Pharmacist Pager: [phone] 03/14/2014 11:05 AM

## 2014-03-14 NOTE — Evaluation (Signed)
Clinical/Bedside Swallow Evaluation Patient Details  Name: [name] MRN: [id number] Date of Birth: November 10, 1928  Today's Date: 03/14/2014 Time: 1308-6578 SLP Time Calculation (min): 48 min  Past Medical History:  Past Medical History  Diagnosis Date  . CAD (coronary artery disease)   . Hyperlipidemia   . Kidney stones   . BPH (benign prostatic hypertrophy)   . Osteoarthritis   . COPD (chronic obstructive pulmonary disease)   . Hypothyroidism   . Atrial fibrillation   . Impaired fasting glucose   . Myocardial infarction   . Hypertension   . Shortness of breath   . CHF (congestive heart failure)   . GERD (gastroesophageal reflux disease)    Past Surgical History:  Past Surgical History  Procedure Laterality Date  . Inguinal hernia repair  09/2004    left  . Back surgery  1990's    x3  . Transurethral resection of prostate  2002  . Pleural scarification  1980    right  . Esophagogastroduodenoscopy  04/2002    negative  . Bladder stone removal  06/2006  . Colonoscopy Left 04/13/2013    Procedure: COLONOSCOPY;  Surgeon:  Modena, MD;  Location: [facility] ENDOSCOPY;  Service: Endoscopy;  Laterality: Left;   HPI:  80 year old male with COPD,diastolic CHF, atrial fibrillation on Coumadin, recently discharge from SNF after being hospitalized for PNA. He came home from SNF one week ago and per family member has been progressively worse, more confused, poor oral intake. Pt is unable to provide history as he rather somnolent and tired and family who was at bedside earlier provided most of the details. Pt apparently had shortness of breat at rest and with exertion. Family also noted visual hallucinations and altered level of consciousness. No reported fevers, chills, chest pain, no abdominal concerns reported   Assessment / Plan / Recommendation Clinical Impression  Patient on nectar thick liquids but has cups with water and straws on bedside table.  Patient reports he "hates" the  thickened liquids and they cause him not to eat much.  Patient admits he "might" sneak drinks without thickener from time to time.  MBS from 01/21/14 was reviewed and discussed with patient.  Patient is at high risk for aspiration with thin liquids, and nectar thick liquids even penetrated the laryngeal vestibule.  Pt. agrees to continue nectar thick liquids for now (will advance to Dys 3 from 2).  If family and patient desire to allow thin liquids, code status and plans for how to respond to recurrent pneumonias should be discussed.  Palliative care meeting with family pending.  Discussed with RN need for Nectar thick liquids only, and no straws.    Aspiration Risk  Moderate    Diet Recommendation Dysphagia 3 (Mechanical Soft);Nectar-thick liquid   Liquid Administration via: Cup;No straw Medication Administration: Whole meds with puree Supervision: Patient able to self feed;Full supervision/cueing for compensatory strategies Compensations: Slow rate;Small sips/bites Postural Changes and/or Swallow Maneuvers: Seated upright 90 degrees    Other  Recommendations Oral Care Recommendations: Oral care BID Other Recommendations: Order thickener from pharmacy;Prohibited food (jello, ice cream, thin soups);Clarify dietary restrictions   Follow Up Recommendations  Home health SLP    Frequency and Duration min 2x/week  2 weeks   Pertinent Vitals/Pain Afebrile; No complaints of pain   SLP Swallow Goals   See care plan  Swallow Study Prior Functional Status       General HPI: 80 year old male with COPD,diastolic CHF, atrial fibrillation on Coumadin, recently discharge from SNF after  being hospitalized for PNA. He came home from SNF one week ago and per family member has been progressively worse, more confused, poor oral intake. Pt is unable to provide history as he rather somnolent and tired and family who was at bedside earlier provided most of the details. Pt apparently had shortness of breat at  rest and with exertion. Family also noted visual hallucinations and altered level of consciousness. No reported fevers, chills, chest pain, no abdominal concerns reported Type of Study: Bedside swallow evaluation Previous Swallow Assessment: 01/21/14 MBS:  Moderate oral phase dysphagia;Severe pharyngeal phase dysphagia Diet Prior to this Study: Dysphagia 2 (chopped);Nectar-thick liquids Temperature Spikes Noted: No Respiratory Status: Room air History of Recent Intubation: No Behavior/Cognition: Alert;Cooperative;Pleasant mood;Requires cueing Oral Cavity - Dentition: Adequate natural dentition Self-Feeding Abilities: Able to feed self Patient Positioning: Upright in bed Baseline Vocal Quality: Clear Volitional Cough: Congested Volitional Swallow: Able to elicit    Oral/Motor/Sensory Function Overall Oral Motor/Sensory Function: Appears within functional limits for tasks assessed   Ice Chips Ice chips: Not tested   Thin Liquid Thin Liquid: Not tested Other Comments:  (Known aspiration with thin liquids)    Nectar Thick Nectar Thick Liquid: Impaired Presentation: Cup Pharyngeal Phase Impairments: Suspected delayed Swallow;Cough - Delayed;Wet Vocal Quality   Honey Thick Honey Thick Liquid: Not tested   Puree Puree: Impaired Presentation: Spoon Pharyngeal Phase Impairments: Suspected delayed Swallow   Solid   GO    Solid: Impaired Presentation: Self Fed Pharyngeal Phase Impairments: Suspected delayed Swallow;Throat Clearing - Delayed       Maryjo RochesterWillis, Montanna Mcbain T 03/14/2014,3:04 PM

## 2014-03-14 NOTE — Progress Notes (Signed)
[facility] PROGRESS NOTE  [name] JXB:[id number] DOB: 1928-06-07 DOA: 03/12/2014 PCP: Tillman Abide, MD  Assessment/Plan:  Principal Problem:   Acute encephalopathy: likely from infection and hypothyroid state.  More lucid today.  Patient has had multiple recent admissions, and was at [facility] for >1 month. Only at home for about 1 week. May need permanent placement. Palliative care meeting for realistic GOC tomorrow Active Problems:   HCAP (healthcare-associated pneumonia) v. Aspiration: CT confirms new RUL infiltrate. Has h/o dysphagia, and was most recently discharged from the hospital on dysphagia 2 diet with nectar thickened liquids. Family reports diet remained thickened liquids, but remained on thins between meals. continue Strict aspiration precautions. Continue vanc, aztreonam and flagyl   Chronic diastolic heart failure: ProBNP is about 1000 which is actually lower than previous. Continue heart failure medications and avoid additional IV fluid.     CAD (coronary artery disease)   Atrial fibrillation   COPD   Benign prostatic hypertrophy   Hypothyroidism: TSH >50. Synthroid increased   Chronic kideny disease stage III  (GFR 30-59 ml/min)   Dysphagia, unspecified(787.20): continue D2 nectar and await ST eval   Essential hypertension, benign  PT eval noted  Code Status:  full Family Communication:  Daughter and wife 3/22 Disposition Plan:  ?  Consultants:  PCT  Procedures:     Antibiotics:  Vancomycin 3/21 -   aztreonam 3/21 -  Metronidazole 3/22 -  HPI/Subjective: C/o lack of sleep.   Objective: Filed Vitals:   03/14/14 0639  BP: 115/53  Pulse: 57  Temp: 97.4 F (36.3 C)  Resp: 17    Intake/Output Summary (Last 24 hours) at 03/14/14 1341 Last data filed at 03/14/14 0900  Gross per 24 hour  Intake    850 ml  Output    800 ml  Net     50 ml   Filed Weights   03/12/14 2048 03/13/14 0538 03/14/14 0639  Weight: 54.25 kg (119 lb 9.6 oz)  54.795 kg (120 lb 12.8 oz) 53.987 kg (119 lb 0.3 oz)    Exam:   General:   asleep. Arousable. More lucid and appropriate  Cardiovascular:  regular rate and rhythm without murmurs gallops or rubs   Respiratory:  rhonchi bilaterally no wheezes or rales.   Abdomen: S, NT, ND  Ext: no CCE  Basic Metabolic Panel:  Recent Labs Lab 03/12/14 1648 03/13/14 0620  NA 139 138  K 4.5 3.9  CL 102 103  CO2 26 22  GLUCOSE 82 88  BUN 15 13  CREATININE 1.22 1.08  CALCIUM 9.1 9.0  MG 2.7*  --    Liver Function Tests:  Recent Labs Lab 03/12/14 1648  AST 20  ALT 15  ALKPHOS 91  BILITOT 0.3  PROT 7.2  ALBUMIN 2.8*   No results found for this basename: LIPASE, AMYLASE,  in the last 168 hours No results found for this basename: AMMONIA,  in the last 168 hours CBC:  Recent Labs Lab 03/12/14 1648 03/13/14 0620  WBC 7.3 7.1  NEUTROABS 5.7  --   HGB 10.7* 9.6*  HCT 32.8* 29.7*  MCV 90.9 90.3  PLT 281 251   Cardiac Enzymes: No results found for this basename: CKTOTAL, CKMB, CKMBINDEX, TROPONINI,  in the last 168 hours BNP (last 3 results)  Recent Labs  01/08/14 2225 01/20/14 2159 03/13/14  PROBNP 1259.0* 1528.0* 1065.0*   TSH 53 Free T4 0.71  CBG:  Recent Labs Lab 03/12/14 1640  GLUCAP 78  Recent Results (from the past 240 hour(s))  GRAM STAIN     Status: None   Collection Time    03/12/14  5:45 PM      Result Value Ref Range Status   Specimen Description URINE, RANDOM   Final   Special Requests NONE   Final   Gram Stain     Final   Value: CYTOSPIN SLIDE     WBC PRESENT, PREDOMINANTLY MONONUCLEAR     GRAM NEGATIVE RODS     BACTERIA ATTACHED TO EPITHELIAL CELLS   Report Status 03/12/2014 FINAL   Final  CULTURE, BLOOD (ROUTINE X 2)     Status: None   Collection Time    03/13/14 12:01 AM      Result Value Ref Range Status   Specimen Description BLOOD LEFT HAND   Final   Special Requests BOTTLES DRAWN AEROBIC ONLY 2CC   Final   Culture  Setup Time      Final   Value: 03/13/2014 13:44     Performed at [facility]   Culture     Final   Value:        BLOOD CULTURE RECEIVED NO GROWTH TO DATE CULTURE WILL BE HELD FOR 5 DAYS BEFORE ISSUING A FINAL NEGATIVE REPORT     Performed at [facility]   Report Status PENDING   Incomplete  CULTURE, BLOOD (ROUTINE X 2)     Status: None   Collection Time    03/13/14 12:05 AM      Result Value Ref Range Status   Specimen Description BLOOD RIGHT ARM   Final   Special Requests     Final   Value: BOTTLES DRAWN AEROBIC AND ANAEROBIC 10CC BLUE,6CC RED   Culture  Setup Time     Final   Value: 03/13/2014 13:44     Performed at [facility]   Culture     Final   Value:        BLOOD CULTURE RECEIVED NO GROWTH TO DATE CULTURE WILL BE HELD FOR 5 DAYS BEFORE ISSUING A FINAL NEGATIVE REPORT     Performed at [facility]   Report Status PENDING   Incomplete     Studies: Dg Chest 2 View  03/12/2014   CLINICAL DATA:  80 year old male with altered mental status.  EXAM: CHEST  2 VIEW  COMPARISON:  01/20/2014 and prior chest radiographs.  FINDINGS: There has been significant decrease and right upper lobe airspace disease since 01/20/2014, but persistent streaky and right perihilar opacity identified. An obstructing central lesion is difficult to exclude and consider chest CT with contrast for further evaluation.  Small bilateral pleural effusions are now noted.  Pulmonary vascular congestion is present.  New interstitial opacities within the left mid and lower lung are noted -question infection, aspiration or interstitial edema.  COPD/emphysema again identified with a right lower lobe bulla.  There is no evidence pneumothorax or acute bony abnormality.  IMPRESSION: New left mid and lower lung interstitial opacites - question infection, aspiration or asymmetric edema.  New small bilateral pleural effusions and pulmonary vascular congestion.  Decreased right upper lobe airspace disease but  persistent streaky opacity and right perihilar prominence could represent an obstructing central lesion. Consider chest CT with contrast for further evaluation.  COPD/emphysema.   Electronically Signed   By: Laveda AbbeJeff  Hu M.D.   On: 03/12/2014 18:39   Ct Head Wo Contrast  03/12/2014   CLINICAL DATA:  Altered mental status, hallucinations  EXAM: CT HEAD WITHOUT CONTRAST  TECHNIQUE: Contiguous axial images were obtained from the base of the skull through the vertex without intravenous contrast.  COMPARISON:  01/21/2014  FINDINGS: No evidence of parenchymal hemorrhage or extra-axial fluid collection. No mass lesion, mass effect, or midline shift.  No CT evidence of acute infarction.  Subcortical white matter and periventricular small vessel ischemic changes. Intracranial atherosclerosis.  Mild global cortical atrophy with secondary ventricular prominence.  The visualized paranasal sinuses are essentially clear. The mastoid air cells are unopacified.  No evidence of calvarial fracture.  IMPRESSION: No evidence of acute intracranial abnormality.  Atrophy with small vessel ischemic changes and intracranial atherosclerosis   Electronically Signed   By: Charline Bills M.D.   On: 03/12/2014 17:08   Ct Chest W Contrast  03/13/2014   CLINICAL DATA:  Short of breath  EXAM: CT CHEST WITH CONTRAST  TECHNIQUE: Multidetector CT imaging of the chest was performed during intravenous contrast administration.  CONTRAST:  80mL OMNIPAQUE IOHEXOL 300 MG/ML  SOLN  COMPARISON:  DG CHEST 2 VIEW dated 03/12/2014; CT CHEST W/O CM dated 02/23/2013  FINDINGS: There is new consolidative airspace disease within the right middle lobe. There is mild airspace disease in the lingula. There is severe emphysematous change throughout the lungs with bullous change the lung bases. There is a partially loculated left pleural effusion. There is left basilar atelectasis versus consolidation. Left lower lobe atelectasis is similar to prior as well as the  fusion. . The upper lobe airspace disease is new.  No axillary or supraclavicular lymphadenopathy. No mediastinal hilar lymphadenopathy. No pericardial fluid. Coronary calcifications are present. Esophagus is normal.  No pulmonary embolism.  There is thickening of the left adrenal gland to 16 mm which is similar prior. No aggressive osseous lesion.  IMPRESSION: 1. New consolidated airspace disease in the right upper lobe is likely infectious or inflammatory however cannot exclude a neoplasm. Recommend follow-up CT in 1 to 3 months. 2. Ground-glass opacities in the upper lobes likely represent pulmonary edema. 3. Chronic left pleural effusion and basilar atelectasis.   Electronically Signed   By: Genevive Bi M.D.   On: 03/13/2014 00:11    Scheduled Meds: . amiodarone  200 mg Oral BID  . amitriptyline  25 mg Oral QHS  . aspirin  81 mg Oral Daily  . aztreonam  1 g Intravenous 3 times per day  . budesonide  0.25 mg Nebulization BID  . carvedilol  3.125 mg Oral BID WC  . coumadin book  1 each Does not apply Once  . diltiazem  300 mg Oral Daily  . feeding supplement (ENSURE)  1 Container Oral TID BM  . fluticasone  2 spray Each Nare Daily  . furosemide  40 mg Oral Daily  . ipratropium  2.5 mL Inhalation Q6H  . levalbuterol  0.63 mg Nebulization 4 times per day  . levothyroxine  75 mcg Oral QAC breakfast  . metronidazole  500 mg Intravenous Q8H  . potassium chloride SA  20 mEq Oral Daily  . sodium chloride  3 mL Intravenous Q12H  . tamsulosin  0.4 mg Oral Daily  . vancomycin  750 mg Intravenous Q24H  . warfarin  2.5 mg Oral ONCE-1800  . warfarin  1 each Does not apply Once  . Warfarin - Pharmacist Dosing Inpatient   Does not apply q1800   Continuous Infusions:   Time spent: 25 minutes  [name]  [facility] Pager [phone]. If 7PM-7AM, please contact night-coverage at  [email or web address], password [facility] 03/14/2014, 1:41 PM  LOS: 2 days

## 2014-03-14 NOTE — Telephone Encounter (Signed)
Left message on VM with results, advised to call if any problems 

## 2014-03-15 DIAGNOSIS — R5381 Other malaise: Secondary | ICD-10-CM

## 2014-03-15 DIAGNOSIS — R5383 Other fatigue: Secondary | ICD-10-CM

## 2014-03-15 DIAGNOSIS — J690 Pneumonitis due to inhalation of food and vomit: Principal | ICD-10-CM

## 2014-03-15 DIAGNOSIS — Z515 Encounter for palliative care: Secondary | ICD-10-CM

## 2014-03-15 LAB — PROTIME-INR
INR: 3.06 — AB (ref 0.00–1.49)
PROTHROMBIN TIME: 30.5 s — AB (ref 11.6–15.2)

## 2014-03-15 MED ORDER — POLYETHYLENE GLYCOL 3350 17 G PO PACK
17.0000 g | PACK | Freq: Every day | ORAL | Status: DC | PRN
  Administered 2014-03-16: 17 g via ORAL
  Filled 2014-03-15: qty 1

## 2014-03-15 MED ORDER — DIPHENHYDRAMINE HCL 12.5 MG/5ML PO ELIX
12.5000 mg | ORAL_SOLUTION | Freq: Once | ORAL | Status: AC
  Administered 2014-03-15: 12.5 mg via ORAL
  Filled 2014-03-15: qty 5

## 2014-03-15 NOTE — Consult Note (Signed)
Patient WJ:XBJYN:[name]      DOB: [birth year]      WGN:[id number]     Consult [facility] Pall[facility]    Consult Requested by: Dr. Lendell CapriceSullivan     PCP: Tillman Abideichard Letvak, MD Reason for Consultation: GOC     Phone Number:[phone]  Assessment of patients Current state: 80 yr old with recurrent dysphagia, and diastolic heart failure admitted with HCAP, and exacerbation of HF.  He was recently in SNF and returned home but was noted over the past week to have worsening shortness of breath at rest and with exertion, hallucinations, and altered mental status.  He was brought to the hospital and treated for pneumonia and heart failure.  His daughter relates that her mom who is the primary care giver does not have insight into when he is getting into trouble.  Patient and Family and i discussed his recurrent illness and the patient was encourage by his family to allow them to consider hospice care. They would still like to treat the treatables but understand that hospice is going to focus on comfort.  Patient's daughter requested dietary support for modified diet as they are finding it difficult to provide the right modifications in the home setting.   Goals of Care: 1.  Code Status:  DNR   2. Scope of Treatment: Family wants to optimize medical therapy for his chronic conditions, and minimize his return to hospital. They would like to try to keep him out of nursing facility.  He is undecided about feeding tubes and they are not committing to over ride him at this time.  She my note from date of service   4. Disposition:  Home with hospice   3. Symptom Management:   1. Dysphagia: will ask dietary to call the patient's daughter with sample menu. 2. Heart failure: improved. Medical management 3.   4. Psychosocial:married with daughter and son in law for support  5. Spiritual: offered spiritual care referral.        Patient Documents Completed or  Given: Document Given Completed  Advanced Directives Pkt    MOST x   DNR    Gone from My Sight    Hard Choices      Brief HPI: 80 yr old white male with multiple medical problems including recurrent CHF, recurrent aspiration, currently with Rt upper lobe pneumonia can't rule out underlying mass.  Spouse not ableo intuitively care form him and family not  Able to be with them daily.  We were asked to assist with goals of care and consideration for hospice care.   ROS:  He states "I wanna go home" , all review of systems negative for him    PMH:  Past Medical History  Diagnosis Date  . CAD (coronary artery disease)   . Hyperlipidemia   . Kidney stones   . BPH (benign prostatic hypertrophy)   . Osteoarthritis   . COPD (chronic obstructive pulmonary disease)   . Hypothyroidism   . Atrial fibrillation   . Impaired fasting glucose   . Myocardial infarction   . Hypertension   . Shortness of breath   . CHF (congestive heart failure)   . GERD (gastroesophageal reflux disease)      PSH: Past Surgical History  Procedure Laterality Date  . Inguinal hernia repair  09/2004    left  . Back surgery  1990's    x3  . Transurethral resection of prostate  2002  . Pleural scarification  1980    right  . Esophagogastroduodenoscopy  04/2002    negative  . Bladder stone removal  06/2006  . Colonoscopy Left 04/13/2013    Procedure: COLONOSCOPY;  Surgeon: Willis Modena, MD;  Location: [facility] ENDOSCOPY;  Service: Endoscopy;  Laterality: Left;   I have reviewed the FH and SH and  If appropriate update it with new information. Allergies  Allergen Reactions  . Fenofibrate Other (See Comments)    REACTION: "ran me up a wall"  . Penicillins Other (See Comments)    blisters  . Atorvastatin Other (See Comments)    REACTION: severe muscle aches  . Ezetimibe-Simvastatin Other (See Comments)    REACTION: severe muscle aches   Scheduled Meds: . amiodarone  200 mg Oral BID  . amitriptyline  25  mg Oral QHS  . aspirin  81 mg Oral Daily  . aztreonam  1 g Intravenous 3 times per day  . budesonide  0.25 mg Nebulization BID  . carvedilol  3.125 mg Oral BID WC  . coumadin book  1 each Does not apply Once  . diltiazem  300 mg Oral Daily  . feeding supplement (ENSURE)  1 Container Oral TID BM  . fluticasone  2 spray Each Nare Daily  . furosemide  40 mg Oral Daily  . ipratropium  2.5 mL Inhalation TID  . levalbuterol  0.63 mg Nebulization TID  . levothyroxine  75 mcg Oral QAC breakfast  . metronidazole  500 mg Intravenous Q8H  . potassium chloride SA  20 mEq Oral Daily  . sodium chloride  3 mL Intravenous Q12H  . tamsulosin  0.4 mg Oral Daily  . vancomycin  750 mg Intravenous Q24H  . warfarin  1 each Does not apply Once  . Warfarin - Pharmacist Dosing Inpatient   Does not apply q1800   Continuous Infusions:  PRN Meds:.sodium chloride, acetaminophen, guaiFENesin, HYDROcodone-acetaminophen, levalbuterol, nitroGLYCERIN, polyethylene glycol, RESOURCE THICKENUP CLEAR, senna-docusate, sodium chloride    BP 115/58  Pulse 63  Temp(Src) 98.1 F (36.7 C) (Oral)  Resp 16  Ht 5\' 7"  (1.702 m)  Wt 57.743 kg (127 lb 4.8 oz)  BMI 19.93 kg/m2  SpO2 94%   PPS: 30-40 %   Intake/Output Summary (Last 24 hours) at 03/15/14 1807 Last data filed at 03/15/14 1419  Gross per 24 hour  Intake      0 ml  Output   1450 ml  Net  -1450 ml    Physical Exam:  General: simple, able to complete sentances but may confabulate slightly HEENT: PERRL, EOMI, anicteric, mmm Chest:   Decreased but clear anterior CVS: IRRR, S1, S2 Abdomen:soft, not tender, not distended Ext: warm, no clubbing , cyanosis or edema Neuro:patient oriented to family and general events, I believe that he is able to tell us what he wants moment to moment, but I don't believe he is able to completely weigh pros and cons or have full insight into his current state  Labs: CBC    Component Value Date/Time   WBC 7.1 03/13/2014  0620   WBC 9.3 02/01/2014   RBC 3.29* 03/13/2014 0620   HGB 9.6* 03/13/2014 0620   HGB 10.3 02/18/2014   HCT 29.7* 03/13/2014 0620   HCT 35 02/18/2014   PLT 251 03/13/2014 0620   PLT 265 02/18/2014   MCV 90.3 03/13/2014 0620   MCV 94.8 02/18/2014   MCH 29.2 03/13/2014 0620   MCHC 32.3 03/13/2014 0620   RDW 16.0* 03/13/2014 0620   LYMPHSABS 0.6*  03/12/2014 1648   MONOABS 0.7 03/12/2014 1648   EOSABS 0.3 03/12/2014 1648   BASOSABS 0.1 03/12/2014 1648     CMP     Component Value Date/Time   NA 138 03/13/2014 0620   NA 139 02/18/2014   K 3.9 03/13/2014 0620   K 4.6 02/18/2014   CL 103 03/13/2014 0620   CO2 22 03/13/2014 0620   GLUCOSE 88 03/13/2014 0620   BUN 13 03/13/2014 0620   BUN 16 02/18/2014   CREATININE 1.08 03/13/2014 0620   CREATININE 1.0 02/18/2014   CREATININE 0.9 02/01/2014   CALCIUM 9.0 03/13/2014 0620   PROT 7.2 03/12/2014 1648   ALBUMIN 2.8* 03/12/2014 1648   AST 20 03/12/2014 1648   ALT 15 03/12/2014 1648   ALKPHOS 91 03/12/2014 1648   BILITOT 0.3 03/12/2014 1648   GFRNONAA 61* 03/13/2014 0620   GFRAA 71* 03/13/2014 0620    Chest Xray Reviewed/Impressions: New left mid and lower lung interstitial opacites - question  infection, aspiration or asymmetric edema.  New small bilateral pleural effusions and pulmonary vascular  congestion.  Decreased right upper lobe airspace disease but persistent streaky  opacity and right perihilar prominence could represent an  obstructing central lesion. Consider chest CT with contrast for  further evaluation.    CT scan of the Head Reviewed/Impressions:  1. New consolidated airspace disease in the right upper lobe is  likely infectious or inflammatory however cannot exclude a neoplasm.  Recommend follow-up CT in 1 to 3 months.  2. Ground-glass opacities in the upper lobes likely represent  pulmonary edema.  3. Chronic left pleural effusion and basilar atelectasis.      Time In Time Out Total Time Spent with Patient Total Overall Time  410  pm 600 pm 90 min 110 min    Greater than 50%  of this time was spent counseling and coordinating care related to the above assessment and plan.    Grahm Etsitty L. Ladona Ridgel, MD MBA The Palliative Medicine Team at [facility] Phone: [phone] Pager: [phone]

## 2014-03-15 NOTE — Progress Notes (Signed)
Clinical Social Work Department BRIEF PSYCHOSOCIAL ASSESSMENT 03/15/2014  Patient:  [name],[name]     Account Number:  [id number]     Admit date:  03/12/2014  Clinical Social Worker:  Leron CroakALLEN,CASSANDRA, CLINICAL SOCIAL WORKER  Date/Time:  03/15/2014 02:50 PM  Referred by:  Physician  Date Referred:  03/15/2014 Referred for  SNF Placement   Other Referral:   Interview type:  Patient Other interview type:   BSW Intern spoke with Pt regarding d/c planning.    PSYCHOSOCIAL DATA Living Status:  FACILITY Admitted from facility:  [facility] Level of care:  Skilled Nursing Facility Primary support name:  Ardeen FillersLisa Bray Primary support relationship to patient:  CHILD, ADULT Degree of support available:   Pt has good support from family.    CURRENT CONCERNS Current Concerns  Post-Acute Placement   Other Concerns:    SOCIAL WORK ASSESSMENT / PLAN BSW Intern  spoke with Pt regarding d/c planning. Pt informed BSW Intern that Pt  was living at [facility]  to do rehab/physical therapy. Pt states that he was living at home with his wife prior to going to [facility]. Pt states that after d/c Pt would love to go back home.  Pt does understands that if Pt  had to go to a SNF, Pt would like to go back to [facility]. Pt states [facility] treated him well while Pt was there.  BSW Intern asked Pt if BSW Intern can send Pt information to SNF's in the local area to see if  they have any bed offers just incase [facility] was not an option. Pt agreed and states that it will be more convenient  for family members if Pt was at [facility].    BSW Intern inform Pt that BSW Intern will fax Pt records and follow-up with Pt.   Assessment/plan status:  Information/[facility] to [facility] Other assessment/ plan:   Information/referral to community resources:    PATIENT'S/FAMILY'S RESPONSE TO PLAN OF CARE: Pt was pleased with BSW Intern services. Pt express how Pt would love to go back home to be  with his family but understands he has to get better.       Akacia Boltz Maryanna ShapeKershaw BSW-Intern [facility] [phone]

## 2014-03-15 NOTE — Clinical Documentation Improvement (Signed)
To Hospitalist MD's, NP's, and PA's    Possible Clinical Conditions?  Patient with BMI of 18.8. If either following conditions are appropriate please document in chart.  Thank you  Moderate Malnutrition    Cachexia    Underweight  Other Condition  Cannot clinically determine   Risk Factors: Dysphagia, Chronic diastolic heart failure, afib, cad currently admitted for HCAP vs Asp Pna    Treatment: dysphagia 2 diet with nectar thickened liquids   Thank You, [name] ,RN Clinical Documentation Specialist:  [phone]  [facility]- Health Information Management

## 2014-03-15 NOTE — Progress Notes (Signed)
PROGRESS NOTE  [name] GUY:[id number] DOB: Apr 27, 1928 DOA: 03/12/2014 PCP: Tillman Abide, MD  Assessment/Plan:    Acute encephalopathy: likely from infection and hypothyroid state.     Patient has had multiple recent admissions, and was at [facility] for >1 month. Only at home for about 1 week. May need permanent placement. Palliative care meeting for realistic GOC tomorrow    HCAP (healthcare-associated pneumonia) v. Aspiration: CT confirms new RUL infiltrate. Has h/o dysphagia, and was most recently discharged from the hospital on dysphagia 2 diet with nectar thickened liquids. Family reports diet remained thickened liquids, but remained on thins between meals. continue Strict aspiration precautions. Continue vanc, aztreonam and flagyl -Recommend follow-up CT in 1 to 3 months to r/o neoplasm    Chronic diastolic heart failure: ProBNP is about 1000 which is actually lower than previous. Continue heart failure medications and avoid additional IV fluid.      CAD (coronary artery disease)   Atrial fibrillation   COPD   Benign prostatic hypertrophy   Hypothyroidism: TSH >50. Synthroid increased   Chronic kideny disease stage III  (GFR 30-59 ml/min)   Dysphagia, unspecified(787.20): continue D2 nectar and await ST eval   Essential hypertension, benign  PT eval noted  Code Status:  full Family Communication:  No family at bedside Disposition Plan:  ?  Consultants:  PCT  Procedures:     Antibiotics:  Vancomycin 3/21 -   aztreonam 3/21 -  Metronidazole 3/22 -  HPI/Subjective: No BM since saturday   Objective: Filed Vitals:   03/15/14 0700  BP: 119/51  Pulse: 59  Temp: 97.7 F (36.5 C)  Resp: 18    Intake/Output Summary (Last 24 hours) at 03/15/14 0821 Last data filed at 03/15/14 0100  Gross per 24 hour  Intake    360 ml  Output   1275 ml  Net   -915 ml   Filed Weights   03/13/14 0538 03/14/14 0639 03/15/14 0535  Weight: 54.795 kg (120 lb 12.8 oz) 53.987  kg (119 lb 0.3 oz) 57.743 kg (127 lb 4.8 oz)    Exam:   General:   A+Ox3, NAD  Cardiovascular:  regular rate and rhythm without murmurs gallops or rubs   Respiratory:  rhonchi bilaterally no wheezes or rales.   Abdomen: S, NT, ND  Ext: no CCE  Basic Metabolic Panel:  Recent Labs Lab 03/12/14 1648 03/13/14 0620  NA 139 138  K 4.5 3.9  CL 102 103  CO2 26 22  GLUCOSE 82 88  BUN 15 13  CREATININE 1.22 1.08  CALCIUM 9.1 9.0  MG 2.7*  --    Liver Function Tests:  Recent Labs Lab 03/12/14 1648  AST 20  ALT 15  ALKPHOS 91  BILITOT 0.3  PROT 7.2  ALBUMIN 2.8*   No results found for this basename: LIPASE, AMYLASE,  in the last 168 hours No results found for this basename: AMMONIA,  in the last 168 hours CBC:  Recent Labs Lab 03/12/14 1648 03/13/14 0620  WBC 7.3 7.1  NEUTROABS 5.7  --   HGB 10.7* 9.6*  HCT 32.8* 29.7*  MCV 90.9 90.3  PLT 281 251   Cardiac Enzymes: No results found for this basename: CKTOTAL, CKMB, CKMBINDEX, TROPONINI,  in the last 168 hours BNP (last 3 results)  Recent Labs  01/08/14 2225 01/20/14 2159 03/13/14  PROBNP 1259.0* 1528.0* 1065.0*   TSH 53 Free T4 0.71  CBG:  Recent Labs Lab 03/12/14 1640  GLUCAP 78  Recent Results (from the past 240 hour(s))  GRAM STAIN     Status: None   Collection Time    03/12/14  5:45 PM      Result Value Ref Range Status   Specimen Description URINE, RANDOM   Final   Special Requests NONE   Final   Gram Stain     Final   Value: CYTOSPIN SLIDE     WBC PRESENT, PREDOMINANTLY MONONUCLEAR     GRAM NEGATIVE RODS     BACTERIA ATTACHED TO EPITHELIAL CELLS   Report Status 03/12/2014 FINAL   Final  CULTURE, BLOOD (ROUTINE X 2)     Status: None   Collection Time    03/13/14 12:01 AM      Result Value Ref Range Status   Specimen Description BLOOD LEFT HAND   Final   Special Requests BOTTLES DRAWN AEROBIC ONLY 2CC   Final   Culture  Setup Time     Final   Value: 03/13/2014 13:44      Performed at [facility]   Culture     Final   Value:        BLOOD CULTURE RECEIVED NO GROWTH TO DATE CULTURE WILL BE HELD FOR 5 DAYS BEFORE ISSUING A FINAL NEGATIVE REPORT     Performed at [facility]   Report Status PENDING   Incomplete  CULTURE, BLOOD (ROUTINE X 2)     Status: None   Collection Time    03/13/14 12:05 AM      Result Value Ref Range Status   Specimen Description BLOOD RIGHT ARM   Final   Special Requests     Final   Value: BOTTLES DRAWN AEROBIC AND ANAEROBIC 10CC BLUE,6CC RED   Culture  Setup Time     Final   Value: 03/13/2014 13:44     Performed at [facility]   Culture     Final   Value:        BLOOD CULTURE RECEIVED NO GROWTH TO DATE CULTURE WILL BE HELD FOR 5 DAYS BEFORE ISSUING A FINAL NEGATIVE REPORT     Performed at [facility]   Report Status PENDING   Incomplete     Studies: No results found.  Scheduled Meds: . amiodarone  200 mg Oral BID  . amitriptyline  25 mg Oral QHS  . aspirin  81 mg Oral Daily  . aztreonam  1 g Intravenous 3 times per day  . budesonide  0.25 mg Nebulization BID  . carvedilol  3.125 mg Oral BID WC  . coumadin book  1 each Does not apply Once  . diltiazem  300 mg Oral Daily  . feeding supplement (ENSURE)  1 Container Oral TID BM  . fluticasone  2 spray Each Nare Daily  . furosemide  40 mg Oral Daily  . ipratropium  2.5 mL Inhalation TID  . levalbuterol  0.63 mg Nebulization TID  . levothyroxine  75 mcg Oral QAC breakfast  . metronidazole  500 mg Intravenous Q8H  . potassium chloride SA  20 mEq Oral Daily  . sodium chloride  3 mL Intravenous Q12H  . tamsulosin  0.4 mg Oral Daily  . vancomycin  750 mg Intravenous Q24H  . warfarin  1 each Does not apply Once  . Warfarin - Pharmacist Dosing Inpatient   Does not apply q1800   Continuous Infusions:   Time spent: 25 minutes  Marlin CanaryVANN, [place]  [facility] Pager [phone]. If 7PM-7AM, please contact  night-coverage at [email or web address],  password [facility] 03/15/2014, 8:21 AM  LOS: 3 days

## 2014-03-15 NOTE — Progress Notes (Signed)
Chaplain offered support to pt and his family through compassionate conversation, empathic listening.  Chaplain will follow as needed.   03/15/14 1600  Clinical Encounter Type  Visited With Patient and family together  Visit Type Spiritual support   Rulon Abideavid B Sherrod, chaplain pager [phone]

## 2014-03-15 NOTE — Consult Note (Signed)
Patient LK:[id number] Lofton      DOB: Jun 02, 1928      LSL:[id number]  Summary of Goals of care: full note to follow:  Met with spouse, daughter and son in law and patient  Patient able to understand that he has multiple medical illness that keep putting him back in the hospital .  I do not think he has the full capacity to understand the long term ramifications of these illnesses but his daughter and her husband do understand.  We talked about the fact the with the chronic heart failure and chronic aspiration that he will get sick again.  We reviewed his advanced directive and he was able to make a change to DNR which his family agrees with.  We talked about prossibly having hospice come out and supplementing with care givers to keep him home.  They are going to try to do this.  For now goals are treat the treatable, modified diet , ask hospice to assist with providing home care vs palliative care if available in his area. Daughter expressed specific needs to be helping with pills during the day, feeding him the modified diet.  Mrs. Brier has not been able to be the best judge of him getting into trouble.  The patient was asked about a feeding tube .  He stated he would rather eat but did not commit.   Recommend:  1.  DNR status  2.  Ask dietary to provide family with sample menus for heart health modified diet  3.  Care management to discuss home with hospice  4.  I will update Dr. Silvio Pate  5. Add flutter valve to help with mucous  6. Consider adding PPI to combat reflux as cause for aspiration.  7. Request SW provide home care list to daughter to supplement hospice care  Total time. 410 pm - 600 pm  Langford Carias L. Lovena Le, MD MBA The Palliative Medicine Team at [facility] Phone: [phone] Pager: [phone]   6.

## 2014-03-15 NOTE — Progress Notes (Signed)
ANTICOAGULATION CONSULT NOTE - Follow Up Consult  Pharmacy Consult for Warfarin; Vancomycin, Aztreonam, Flagyl Indication: atrial fibrillation; HCAP  Allergies  Allergen Reactions  . Fenofibrate Other (See Comments)    REACTION: "ran me up a wall"  . Penicillins Other (See Comments)    blisters  . Atorvastatin Other (See Comments)    REACTION: severe muscle aches  . Ezetimibe-Simvastatin Other (See Comments)    REACTION: severe muscle aches    Patient Measurements: Height: 5\' 7"  (170.2 cm) Weight: 127 lb 4.8 oz (57.743 kg) IBW/kg (Calculated) : 66.1  Vital Signs: Temp: 97.7 F (36.5 C) (03/24 0700) Temp src: Oral (03/24 0700) BP: 119/51 mmHg (03/24 0700) Pulse Rate: 59 (03/24 0700)  Labs:  Recent Labs  03/12/14 1648 03/13/14 0620 03/14/14 0622 03/15/14 0459  HGB 10.7* 9.6*  --   --   HCT 32.8* 29.7*  --   --   PLT 281 251  --   --   LABPROT 22.5* 22.5* 25.8* 30.5*  INR 2.05* 2.05* 2.45* 3.06*  CREATININE 1.22 1.08  --   --     Estimated Creatinine Clearance: 42.3 ml/min (by C-G formula based on Cr of 1.08).   Medications:  Scheduled:  . amiodarone  200 mg Oral BID  . amitriptyline  25 mg Oral QHS  . aspirin  81 mg Oral Daily  . aztreonam  1 g Intravenous 3 times per day  . budesonide  0.25 mg Nebulization BID  . carvedilol  3.125 mg Oral BID WC  . coumadin book  1 each Does not apply Once  . diltiazem  300 mg Oral Daily  . feeding supplement (ENSURE)  1 Container Oral TID BM  . fluticasone  2 spray Each Nare Daily  . furosemide  40 mg Oral Daily  . ipratropium  2.5 mL Inhalation TID  . levalbuterol  0.63 mg Nebulization TID  . levothyroxine  75 mcg Oral QAC breakfast  . metronidazole  500 mg Intravenous Q8H  . potassium chloride SA  20 mEq Oral Daily  . sodium chloride  3 mL Intravenous Q12H  . tamsulosin  0.4 mg Oral Daily  . vancomycin  750 mg Intravenous Q24H  . warfarin  1 each Does not apply Once  . Warfarin - Pharmacist Dosing Inpatient    Does not apply q1800    Assessment: 47 YOM admitted with encephalopathy. Had a recently elevated INR without bleeding and warfarin was on hold for 2 days (he thinks). He was unsure of what new dosing plan was. Previous warfarin dose was 5mg  daily except 2.5mg  on Tuesdays, Thursdays and Saturdays.  Noted patient was also on amiodarone and Synthroid which can both potentiate INR. Flagyl was added yesterday which also can increase INR. INR on admission therapeutic at 2.05, but has now risen quickly to 3.06. Hgb and plts are low but stable. No bleeding noted.  He is also on Day #4 of Vancomycin, Aztreonam, and Flagyl for HCAP vs. Aspiration pneumonia.  His renal function remains stable.  Goal of Therapy:  INR 2-3 Monitor platelets by anticoagulation protocol: Yes   Plan:  1. No Coumadin today 2. Daily PT/INR 3. Will provide education for patient once disposition planning is more clear. Likely will need to education patient and a family member prior to discharge. 4. Continue current Vancomycin, Aztreonam, and Flagyl regimens 5. Await outcomes of palliative care meeting to determine need for Vancomycin trough monitoring  Estella Husk, Pharm.D., BCPS, AAHIVP Clinical Pharmacist Phone: [phone] or [phone] 03/15/2014,  11:11 AM

## 2014-03-16 LAB — BASIC METABOLIC PANEL
BUN: 24 mg/dL — AB (ref 6–23)
CHLORIDE: 104 meq/L (ref 96–112)
CO2: 21 mEq/L (ref 19–32)
CREATININE: 1.05 mg/dL (ref 0.50–1.35)
Calcium: 8.9 mg/dL (ref 8.4–10.5)
GFR calc non Af Amer: 64 mL/min — ABNORMAL LOW (ref >90)
GFR, EST AFRICAN AMERICAN: 74 mL/min — AB (ref >90)
GLUCOSE: 96 mg/dL (ref 70–99)
POTASSIUM: 3.5 meq/L — AB (ref 3.7–5.3)
Sodium: 139 mEq/L (ref 137–147)

## 2014-03-16 LAB — CBC
HCT: 28.6 % — ABNORMAL LOW (ref 39.0–52.0)
Hemoglobin: 9.2 g/dL — ABNORMAL LOW (ref 13.0–17.0)
MCH: 28.9 pg (ref 26.0–34.0)
MCHC: 32.2 g/dL (ref 30.0–36.0)
MCV: 89.9 fL (ref 78.0–100.0)
PLATELETS: 274 10*3/uL (ref 150–400)
RBC: 3.18 MIL/uL — ABNORMAL LOW (ref 4.22–5.81)
RDW: 15.9 % — AB (ref 11.5–15.5)
WBC: 8.8 10*3/uL (ref 4.0–10.5)

## 2014-03-16 LAB — PROTIME-INR
INR: 2.86 — ABNORMAL HIGH (ref 0.00–1.49)
PROTHROMBIN TIME: 29 s — AB (ref 11.6–15.2)

## 2014-03-16 MED ORDER — WARFARIN SODIUM 2.5 MG PO TABS
2.5000 mg | ORAL_TABLET | Freq: Once | ORAL | Status: AC
  Administered 2014-03-16: 2.5 mg via ORAL
  Filled 2014-03-16: qty 1

## 2014-03-16 MED ORDER — TEMAZEPAM 7.5 MG PO CAPS
7.5000 mg | ORAL_CAPSULE | Freq: Every evening | ORAL | Status: DC | PRN
  Administered 2014-03-16: 7.5 mg via ORAL
  Filled 2014-03-16: qty 1

## 2014-03-16 MED ORDER — LEVOTHYROXINE SODIUM 100 MCG PO TABS
100.0000 ug | ORAL_TABLET | Freq: Every day | ORAL | Status: DC
Start: 2014-03-17 — End: 2014-03-17
  Administered 2014-03-17: 100 ug via ORAL
  Filled 2014-03-16 (×2): qty 1

## 2014-03-16 MED ORDER — PANTOPRAZOLE SODIUM 40 MG PO TBEC
40.0000 mg | DELAYED_RELEASE_TABLET | Freq: Every day | ORAL | Status: DC
  Administered 2014-03-16 – 2014-03-17 (×2): 40 mg via ORAL
  Filled 2014-03-16 (×2): qty 1

## 2014-03-16 MED ORDER — IPRATROPIUM BROMIDE 0.02 % IN SOLN: 2.5000 mL | Freq: Four times a day (QID) | RESPIRATORY_TRACT | Status: DC | PRN

## 2014-03-16 MED ORDER — BISACODYL 10 MG RE SUPP
10.0000 mg | Freq: Every day | RECTAL | Status: DC | PRN
  Filled 2014-03-16: qty 1

## 2014-03-16 NOTE — Progress Notes (Signed)
ANTICOAGULATION CONSULT NOTE - Follow Up Consult  Pharmacy Consult for Warfarin; Vancomycin, Aztreonam, Flagyl Indication: atrial fibrillation; HCAP  Allergies  Allergen Reactions  . Fenofibrate Other (See Comments)    REACTION: "ran me up a wall"  . Penicillins Other (See Comments)    blisters  . Atorvastatin Other (See Comments)    REACTION: severe muscle aches  . Ezetimibe-Simvastatin Other (See Comments)    REACTION: severe muscle aches    Patient Measurements: Height: 5\' 7"  (170.2 cm) Weight: 123 lb 8 oz (56.019 kg) IBW/kg (Calculated) : 66.1  Vital Signs: Temp: 98.1 F (36.7 C) (03/25 0624) BP: 128/53 mmHg (03/25 0624) Pulse Rate: 72 (03/25 0624)  Labs:  Recent Labs  03/14/14 0622 03/15/14 0459 03/16/14 0540  HGB  --   --  9.2*  HCT  --   --  28.6*  PLT  --   --  274  LABPROT 25.8* 30.5* 29.0*  INR 2.45* 3.06* 2.86*  CREATININE  --   --  1.05    Estimated Creatinine Clearance: 42.2 ml/min (by C-G formula based on Cr of 1.05).   Medications:  Scheduled:  . amiodarone  200 mg Oral BID  . amitriptyline  25 mg Oral QHS  . aspirin  81 mg Oral Daily  . aztreonam  1 g Intravenous 3 times per day  . budesonide  0.25 mg Nebulization BID  . carvedilol  3.125 mg Oral BID WC  . coumadin book  1 each Does not apply Once  . diltiazem  300 mg Oral Daily  . feeding supplement (ENSURE)  1 Container Oral TID BM  . fluticasone  2 spray Each Nare Daily  . furosemide  40 mg Oral Daily  . ipratropium  2.5 mL Inhalation TID  . levalbuterol  0.63 mg Nebulization TID  . levothyroxine  75 mcg Oral QAC breakfast  . metronidazole  500 mg Intravenous Q8H  . pantoprazole  40 mg Oral Daily  . potassium chloride SA  20 mEq Oral Daily  . sodium chloride  3 mL Intravenous Q12H  . tamsulosin  0.4 mg Oral Daily  . vancomycin  750 mg Intravenous Q24H  . warfarin  1 each Does not apply Once  . Warfarin - Pharmacist Dosing Inpatient   Does not apply q1800    Assessment: [age] YOM  admitted with encephalopathy. Had a recently elevated INR without bleeding and warfarin was on hold for 2 days (he thinks). He was unsure of what new dosing plan was. Previous warfarin dose was 5mg  daily except 2.5mg  on Tuesdays, Thursdays and Saturdays.  Noted patient was also on amiodarone and Synthroid which can both potentiate INR. Flagyl was added yesterday which also can increase INR. INR is down slightly to 2.86 after holding Coumadin on 3/24. Hgb and plts are low but stable. No bleeding noted.  He is also on Day #5 of Vancomycin, Aztreonam, and Flagyl for HCAP vs. Aspiration pneumonia.  His renal function remains stable.  Goal of Therapy:  INR 2-3 Monitor platelets by anticoagulation protocol: Yes   Plan:  1. Coumadin 2.5mg  today 2. Daily PT/INR 3. Will provide education for patient once disposition planning is more clear. Likely will need to education patient and a family member prior to discharge. 4. Continue current Vancomycin, Aztreonam, and Flagyl regimens 5. Anticipate checking Vancomycin trough on 3/26 if duration of therapy is not defined by that point.  Estella HuskMichelle Jhalen Eley, Pharm.D., BCPS, AAHIVP Clinical Pharmacist Phone: [phone] or [phone] 03/16/2014, 10:40 AM

## 2014-03-16 NOTE — Care Management Note (Signed)
  Page 1 of 1   03/16/2014     10:45:45 AM   CARE MANAGEMENT NOTE 03/16/2014  Patient:  [name]   Account Number:  [id number]  Date Initiated:  03/16/2014  Documentation initiated by:  Ronny FlurryWILE,Jhanae Jaskowiak  Subjective/Objective Assessment:     Action/Plan:   Anticipated DC Date:     Anticipated DC Plan:  [facility]         Choice offered to / List presented to:             Status of service:   Medicare Important Message given?   (If response is "NO", the following Medicare IM given date fields will be blank) Date Medicare IM given:   Date Additional Medicare IM given:    Discharge Disposition:    Per UR Regulation:    If discussed at Long Length of Stay Meetings, dates discussed:    Comments:  03-16-14 Spoke with patient , he wants to go home and not to SNF at discharge. Patient requested NCM call his daughter [name] [phone] regarding discharge plan .  Confirmed face sheet information with patient .  [name] requesting she be the contact person for her father , states her mother is 585 and very forgetful and gets overwhelmed easily .  Provided [name] with list of home hospice agencies over the phone , she would [facility] to [facility] , [place] .  Ms Jason FilaBray stated patient lives with his wife , [facility] and [facility]/ OT/ ST / Charity fundraiserN .  They do want to change to hospice / palliative care .   Patient has rolling walker and oxygen at home. He has been weaned off the oxygen . Ms Jason FilaBray requesting 3 in 1 and stool chair ( patient's bathroom very tiny , home built in 1960's) .  Patient will be transported home in car . Ms Jason FilaBray has to take her mother to appointment tomorrow from 1500 to 1630 there will be no one in the home .  Ronny FlurryHeather Porshia Blizzard RN BSN [phone]

## 2014-03-16 NOTE — Plan of Care (Cosign Needed)
Problem: Food- and Nutrition-Related Knowledge Deficit (NB-1.1) Goal: Nutrition education Formal process to instruct or train a patient/client in a skill or to impart knowledge to help patients/clients voluntarily manage or modify food choices and eating behavior to maintain or improve health. Outcome: Completed/Met Date Met:  03/16/14 Nutrition Education Note  Dietetic intern consulted for nutrition education regarding dysphagia diet education.  Dietetic intern provided "Heart Failure Nutrition Therapy for the Undernourished" and "Dysphagia Level 3" handouts from the Academy of Nutrition and Dietetics. Patient's family members were not present upon dietetic intern visit. Patient stated that his daughter would not come by to visit until after 5 pm. Dietetic intern educated patient and left contact information on the handouts provided.   Reviewed foods to avoid. Discouraged intake of high sodium foods. Explained how to follow the heart healthy/low sodium diet with modified textures. Discouraged patient from consuming unsafe textures and fluid consistencies. Encouraged patient to eat well with a diet full of soft fruits, vegetables, and lean meats. Encouraged patient to increase intake of energy dense and protein rich foods. Patient expects to eat better at home as he does not care for hospital food. Patient was receptive to diet education but was preoccupied with wanting to be discharged. Teach back method used.  Expect good compliance.  Body mass index is 19.34 kg/(m^2). Patient meets criteria for normal weight based on current BMI.  Current diet order is Dysphagia 3 with nectar thick liquids. Patient is consuming approximately 25-50% of meals at this time. Labs and medications reviewed. Patient is on palliative care. No further interventions warranted at this time. RD and dietetic intern contact information provided. Encouraged patient to have family members contact RD team with questions. If  additional nutrition issues arise, please re-consult RD.  Claudell Kyle, Dietetic Intern Pager: [phone]

## 2014-03-16 NOTE — Progress Notes (Signed)
PROGRESS NOTE  [name] OEU:[id number] DOB: 28-Jun-1928 DOA: 03/12/2014 PCP: Viviana Simpler, MD  Assessment/Plan:    Acute encephalopathy: likely from infection and hypothyroid state.     Patient has had multiple recent admissions, and was at [facility] for >1 month. Only at home for about 1 week. May need permanent placement. Palliative care meeting- home with hospice?    HCAP (healthcare-associated pneumonia) v. Aspiration: CT confirms new RUL infiltrate. Has h/o dysphagia, and was most recently discharged from the hospital on dysphagia 2 diet with nectar thickened liquids. Family reports diet remained thickened liquids, but remained on thins between meals. continue Strict aspiration precautions. Continue vanc, aztreonam and flagyl -Recommend follow-up CT of lung  in 1 to 3 months to r/o neoplasm    Chronic diastolic heart failure: ProBNP is about 1000 which is actually lower than previous. Continue heart failure medications and avoid additional IV fluid.      CAD (coronary artery disease)   Atrial fibrillation   COPD   Benign prostatic hypertrophy   Hypothyroidism: TSH >50. Synthroid increasedl; repeat TSH   Chronic kideny disease stage III  (GFR 30-59 ml/min)   Dysphagia, unspecified(787.20): DYS diet   Essential hypertension, benign  PT eval noted  Code Status:  DNR Family Communication:  Family met with palliative care yesterday Disposition Plan:  ?home with hopsice?  Consultants:  PCT  Procedures:     Antibiotics:  Vancomycin 3/21 -   aztreonam 3/21 -  Metronidazole 3/22 -  HPI/Subjective: Feeling better Says not eating much  Objective: Filed Vitals:   03/16/14 0624  BP: 128/53  Pulse: 72  Temp: 98.1 F (36.7 C)  Resp: 19    Intake/Output Summary (Last 24 hours) at 03/16/14 1012 Last data filed at 03/16/14 0900  Gross per 24 hour  Intake   2200 ml  Output   2150 ml  Net     50 ml   Filed Weights   03/14/14 0639 03/15/14 0535 03/16/14 0500    Weight: 53.987 kg (119 lb 0.3 oz) 57.743 kg (127 lb 4.8 oz) 56.019 kg (123 lb 8 oz)    Exam:   General:   A+Ox3, NAD  Cardiovascular:  regular rate and rhythm without murmurs gallops or rubs   Respiratory:  rhonchi bilaterally no wheezes or rales.   Abdomen: S, NT, ND  Ext: no CCE  Basic Metabolic Panel:  Recent Labs Lab 03/12/14 1648 03/13/14 0620 03/16/14 0540  NA 139 138 139  K 4.5 3.9 3.5*  CL 102 103 104  CO2 26 22 21   GLUCOSE 82 88 96  BUN 15 13 24*  CREATININE 1.22 1.08 1.05  CALCIUM 9.1 9.0 8.9  MG 2.7*  --   --    Liver Function Tests:  Recent Labs Lab 03/12/14 1648  AST 20  ALT 15  ALKPHOS 91  BILITOT 0.3  PROT 7.2  ALBUMIN 2.8*   No results found for this basename: LIPASE, AMYLASE,  in the last 168 hours No results found for this basename: AMMONIA,  in the last 168 hours CBC:  Recent Labs Lab 03/12/14 1648 03/13/14 0620 03/16/14 0540  WBC 7.3 7.1 8.8  NEUTROABS 5.7  --   --   HGB 10.7* 9.6* 9.2*  HCT 32.8* 29.7* 28.6*  MCV 90.9 90.3 89.9  PLT 281 251 274   Cardiac Enzymes: No results found for this basename: CKTOTAL, CKMB, CKMBINDEX, TROPONINI,  in the last 168 hours BNP (last 3 results)  Recent Labs  01/08/14  2225 01/20/14 2159 03/13/14  PROBNP 1259.0* 1528.0* 1065.0*   TSH 53 Free T4 0.71  CBG:  Recent Labs Lab 03/12/14 1640  GLUCAP 78    Recent Results (from the past 240 hour(s))  GRAM STAIN     Status: None   Collection Time    03/12/14  5:45 PM      Result Value Ref Range Status   Specimen Description URINE, RANDOM   Final   Special Requests NONE   Final   Gram Stain     Final   Value: CYTOSPIN SLIDE     WBC PRESENT, PREDOMINANTLY MONONUCLEAR     GRAM NEGATIVE RODS     BACTERIA ATTACHED TO EPITHELIAL CELLS   Report Status 03/12/2014 FINAL   Final  CULTURE, BLOOD (ROUTINE X 2)     Status: None   Collection Time    03/13/14 12:01 AM      Result Value Ref Range Status   Specimen Description BLOOD LEFT  HAND   Final   Special Requests BOTTLES DRAWN AEROBIC ONLY 2CC   Final   Culture  Setup Time     Final   Value: 03/13/2014 13:44     Performed at [facility]   Culture     Final   Value:        BLOOD CULTURE RECEIVED NO GROWTH TO DATE CULTURE WILL BE HELD FOR 5 DAYS BEFORE ISSUING A FINAL NEGATIVE REPORT     Performed at [facility]   Report Status PENDING   Incomplete  CULTURE, BLOOD (ROUTINE X 2)     Status: None   Collection Time    03/13/14 12:05 AM      Result Value Ref Range Status   Specimen Description BLOOD RIGHT ARM   Final   Special Requests     Final   Value: BOTTLES DRAWN AEROBIC AND ANAEROBIC 10CC BLUE,6CC RED   Culture  Setup Time     Final   Value: 03/13/2014 13:44     Performed at [facility]   Culture     Final   Value:        BLOOD CULTURE RECEIVED NO GROWTH TO DATE CULTURE WILL BE HELD FOR 5 DAYS BEFORE ISSUING A FINAL NEGATIVE REPORT     Performed at [facility]   Report Status PENDING   Incomplete     Studies: No results found.  Scheduled Meds: . amiodarone  200 mg Oral BID  . amitriptyline  25 mg Oral QHS  . aspirin  81 mg Oral Daily  . aztreonam  1 g Intravenous 3 times per day  . budesonide  0.25 mg Nebulization BID  . carvedilol  3.125 mg Oral BID WC  . coumadin book  1 each Does not apply Once  . diltiazem  300 mg Oral Daily  . feeding supplement (ENSURE)  1 Container Oral TID BM  . fluticasone  2 spray Each Nare Daily  . furosemide  40 mg Oral Daily  . ipratropium  2.5 mL Inhalation TID  . levalbuterol  0.63 mg Nebulization TID  . levothyroxine  75 mcg Oral QAC breakfast  . metronidazole  500 mg Intravenous Q8H  . pantoprazole  40 mg Oral Daily  . potassium chloride SA  20 mEq Oral Daily  . sodium chloride  3 mL Intravenous Q12H  . tamsulosin  0.4 mg Oral Daily  . vancomycin  750 mg Intravenous Q24H  . warfarin  1 each Does  not apply Once  . Warfarin - Pharmacist Dosing Inpatient   Does not apply q1800     Continuous Infusions:   Time spent: 25 minutes  Eulogio Bear  [facility] Pager [phone]. If 7PM-7AM, please contact night-coverage at [email or web address], password [facility] 03/16/2014, 10:12 AM  LOS: 4 days

## 2014-03-16 NOTE — Progress Notes (Signed)
Notified by Herbert SetaHeather [facility] pt/family request services of [facility] ([facility]) after d/c; spoke briefly with pt at bedside, no family present- pt indicated I should also speak with his dtr Silvano BilisLisa Brady [phone]); writer spoke with Misty StanleyLisa to begin discussion around hospice philosophy, services at home, and team approach to care; Misty StanleyLisa voiced good understanding of information provided- Misty StanleyLisa stated she needed to take her mother to an appointment tomorrow from3-4:30 pm and was not sure if her dad would be discharged early in the day or would have to wait until after that appointment- Misty StanleyLisa was not sure if she would have someone who could be with her dad between 3-4:30 if he did come home early - Misty StanleyLisa stated she would plan to bring her dad home via personal vehicle -please send completed GOLD DNR form home with pt's discharge paperwork  DME needs discussed - pt declines a hospital bed and wheelchair at this time -requests a 3n1 BSC and shower chair; equipment list left in pt's room for daughter to pick up later today, she is aware [facility] team can assist in getting additional equipment once pt is home if needed;pt [facility]; currently pt is on Room Air - O2 [facility];  information discussed with [facility] Herbert SetaHeather, who will notify [facility] of equipment requests  Please notify [facility] when pt ready to leave unit call [phone], or [phone] after 5pm Please call with any questions. Thank you   Valente DavidMargie Lowana Hable, RN

## 2014-03-16 NOTE — Progress Notes (Signed)
Speech Language Pathology Treatment: Dysphagia  Patient Details Name: [name] MRN: [id number] DOB: 11-May-1928 Today's Date: 03/16/2014 Time: 1345-1410 SLP Time Calculation (min): 25 min  Assessment / Plan / Recommendation Clinical Impression  Pt resting in bed, no family present.  Pt had lunch tray at bedside. Pt was willing to take sips of nectar thick liquid, but refused any of his lunch.  No overt s/s aspiration observed on nectar thick liquids.  Pt was given information on natural thickeners to take home, and was encouraged to take yellow safe swallow precaution sheet as well.  Pt reported not liking the nectar thick liquids.  Reviewed briefly his recent BSE, including aspiration risk.  No family was present, however.  SLP will continue to follow acutely, to provide additional education and answer questions of family.   HPI HPI: 80 year old male with COPD,diastolic CHF, atrial fibrillation on Coumadin, recently discharge from SNF after being hospitalized for PNA. He came home from SNF one week ago and per family member has been progressively worse, more confused, poor oral intake. Pt is unable to provide history as he rather somnolent and tired and family who was at bedside earlier provided most of the details. Pt apparently had shortness of breat at rest and with exertion. Family also noted visual hallucinations and altered level of consciousness. No reported fevers, chills, chest pain, no abdominal concerns reported   Pertinent Vitals VSS  SLP Plan  Continue with current plan of care    Recommendations Diet recommendations: Dysphagia 3 (mechanical soft);Nectar-thick liquid Liquids provided via: Cup Medication Administration: Whole meds with puree Supervision: Patient able to self feed;Full supervision/cueing for compensatory strategies Compensations: Slow rate;Small sips/bites Postural Changes and/or Swallow Maneuvers: Seated upright 90 degrees              Oral Care  Recommendations: Oral care BID Follow up Recommendations: Home health SLP Plan: Continue with current plan of care    GO    [name], [facility] CenterMSP, CCC-SLP [phone]  Leigh AuroraBueche, Jesly Hartmann Brown 03/16/2014, 2:18 PM

## 2014-03-16 NOTE — Progress Notes (Signed)
Clinical Social Work Department BRIEF PSYCHOSOCIAL ASSESSMENT 03/15/2014  Patient:  [name] HospitalROBERTS,Candy     Account Number:  [id number]     Admit date:  03/12/2014  Clinical Social Worker:  Leron CroakALLEN,Klee Kolek, CLINICAL SOCIAL WORKER  Date/Time:  03/15/2014 02:50 PM  Referred by:  Physician  Date Referred:  03/15/2014 Referred for  SNF Placement   Other Referral:   Interview type:  Patient Other interview type:   BSW Intern spoke with Pt regarding d/c planning.    PSYCHOSOCIAL DATA Living Status:  FACILITY Admitted from facility:  [facility] Level of care:  Skilled Nursing Facility Primary support name:  Ardeen FillersLisa Bray Primary support relationship to patient:  CHILD, ADULT Degree of support available:   Pt has good support from family.    CURRENT CONCERNS Current Concerns  Post-Acute Placement   Other Concerns:    SOCIAL WORK ASSESSMENT / PLAN BSW Intern  spoke with Pt regarding d/c planning. Pt informed BSW Intern that [facility]  [facility] do [facility]/physical therapy. Pt states that he was living at home with his wife [facility] to [facility]. Pt states that after d/c Pt would love to go back home.  Pt does understands that if Pt  had to go to a SNF, Pt would like to [facility]. Pt states [facility] well while Pt was there.  BSW Intern asked Pt if BSW Intern can send Pt information to SNF's in the local area to see if  they have any bed offers just incase [facility] was not an option. Pt agreed and states that it will be more convenient  for family members [facility].    BSW Intern inform Pt that BSW Intern will fax Pt records and follow-up with Pt.   Assessment/plan status:  Information/[facility]erral to [facility] Other assessment/ plan:   Information/referral to community resources:    PATIENT'S/FAMILY'S RESPONSE TO PLAN OF CARE: Pt was pleased with BSW Intern services. Pt express how Pt would love to go back home to be  with his family but understands he has to get better.       Myioshi Maryanna ShapeKershaw BSW-Intern [facility] [phone]  CSW reviewed assessment and agree with documentation.    Leron Croakassandra Samik Balkcom [facility] HospitalCSWA  [facility]  4N 1-16;  [id number] Phone: [phone]

## 2014-03-17 ENCOUNTER — Telehealth: Payer: Self-pay

## 2014-03-17 DIAGNOSIS — K5900 Constipation, unspecified: Secondary | ICD-10-CM

## 2014-03-17 DIAGNOSIS — R4182 Altered mental status, unspecified: Secondary | ICD-10-CM

## 2014-03-17 LAB — PROTIME-INR
INR: 2.8 — ABNORMAL HIGH (ref 0.00–1.49)
Prothrombin Time: 28.5 seconds — ABNORMAL HIGH (ref 11.6–15.2)

## 2014-03-17 MED ORDER — LEVOFLOXACIN 750 MG PO TABS: 750.0000 mg | ORAL_TABLET | Freq: Every day | ORAL | Status: DC

## 2014-03-17 MED ORDER — WARFARIN SODIUM 2.5 MG PO TABS
2.5000 mg | ORAL_TABLET | Freq: Once | ORAL | Status: DC
  Filled 2014-03-17: qty 1

## 2014-03-17 MED ORDER — PANTOPRAZOLE SODIUM 40 MG PO TBEC: 40.0000 mg | DELAYED_RELEASE_TABLET | Freq: Every day | ORAL | Status: DC

## 2014-03-17 MED ORDER — POLYETHYLENE GLYCOL 3350 17 G PO PACK: 17.0000 g | PACK | Freq: Every day | ORAL | Status: AC | PRN

## 2014-03-17 MED ORDER — BISACODYL 10 MG RE SUPP: 10.0000 mg | Freq: Every day | RECTAL | Status: AC | PRN

## 2014-03-17 MED ORDER — LEVOTHYROXINE SODIUM 100 MCG PO TABS: 100.0000 ug | ORAL_TABLET | Freq: Every day | ORAL | Status: AC

## 2014-03-17 NOTE — Telephone Encounter (Signed)
[name] pt is being discharged from hospital today dx CHF and wants to know if Dr Alphonsus SiasLetvak will be the attending doctor for hospice and would he like symptom mgt by hospice doctor also.

## 2014-03-17 NOTE — Discharge Instructions (Signed)
[facility] [phone]

## 2014-03-17 NOTE — Progress Notes (Signed)
ANTICOAGULATION CONSULT NOTE - Follow Up Consult  Pharmacy Consult for Warfarin Indication: atrial fibrillation  Allergies  Allergen Reactions  . Fenofibrate Other (See Comments)    REACTION: "ran me up a wall"  . Penicillins Other (See Comments)    blisters  . Atorvastatin Other (See Comments)    REACTION: severe muscle aches  . Ezetimibe-Simvastatin Other (See Comments)    REACTION: severe muscle aches    Patient Measurements: Height: 5\' 7"  (170.2 cm) Weight: 122 lb 3.2 oz (55.43 kg) IBW/kg (Calculated) : 66.1  Vital Signs: Temp: 98.6 F (37 C) (03/26 0602) Temp src: Oral (03/26 0602) BP: 105/53 mmHg (03/26 0602) Pulse Rate: 61 (03/26 0602)  Labs:  Recent Labs  03/15/14 0459 03/16/14 0540 03/17/14 0553  HGB  --  9.2*  --   HCT  --  28.6*  --   PLT  --  274  --   LABPROT 30.5* 29.0* 28.5*  INR 3.06* 2.86* 2.80*  CREATININE  --  1.05  --     Estimated Creatinine Clearance: 41.8 ml/min (by C-G formula based on Cr of 1.05).   Medications:  Scheduled:  . amiodarone  200 mg Oral BID  . amitriptyline  25 mg Oral QHS  . aspirin  81 mg Oral Daily  . aztreonam  1 g Intravenous 3 times per day  . budesonide  0.25 mg Nebulization BID  . carvedilol  3.125 mg Oral BID WC  . coumadin book  1 each Does not apply Once  . diltiazem  300 mg Oral Daily  . feeding supplement (ENSURE)  1 Container Oral TID BM  . fluticasone  2 spray Each Nare Daily  . furosemide  40 mg Oral Daily  . levothyroxine  100 mcg Oral QAC breakfast  . metronidazole  500 mg Intravenous Q8H  . pantoprazole  40 mg Oral Daily  . potassium chloride SA  20 mEq Oral Daily  . sodium chloride  3 mL Intravenous Q12H  . tamsulosin  0.4 mg Oral Daily  . vancomycin  750 mg Intravenous Q24H  . warfarin  1 each Does not apply Once  . Warfarin - Pharmacist Dosing Inpatient   Does not apply q1800    Assessment: [age] YOM admitted with encephalopathy. Had a recently elevated INR without bleeding and warfarin  was on hold for 2 days (he thinks). He was unsure of what new dosing plan was. Previous warfarin dose was 5mg  daily except 2.5mg  on Tuesdays, Thursdays and Saturdays.  Noted patient was also on amiodarone and Synthroid which can both potentiate INR. He remains on Flagyl which also can increase INR. INR is down slightly to 2.8 after holding Coumadin on 3/24. Hgb and plts are low but stable. No bleeding noted.  Goal of Therapy:  INR 2-3 Monitor platelets by anticoagulation protocol: Yes   Plan:  1. Coumadin 2.5mg  today 2. Daily PT/INR  Estella HuskMichelle Thresia Ramanathan, Pharm.D., BCPS, AAHIVP Clinical Pharmacist Phone: [phone] or [phone] 03/17/2014, 11:31 AM

## 2014-03-17 NOTE — Telephone Encounter (Signed)
I will be the attending  I prefer not to have the hospice doctor do symptom management at this time Ask the family if they would like me to make a home visit

## 2014-03-17 NOTE — Telephone Encounter (Signed)
Spoke to him and daughter He hopes to be able to come in but I will tentatively plan to see him on April 8th as a home visit

## 2014-03-17 NOTE — Progress Notes (Signed)
Patient discharge paperwork complete and reviewed with patient. Reviewed medication and administration. Review follow up visit with patient. Patient instructed to remove all belongings from room.

## 2014-03-17 NOTE — Progress Notes (Signed)
Physical Therapy Treatment Patient Details Name: [name] GowdaDanah Holland MRN: [id number] DOB: 02-17-1928 Today's Date: 03/17/2014    History of Present Illness 80 year old male with COPD,diastolic CHF, atrial fibrillation on Coumadin, recently discharge from SNF after being hospitalized for PNA. He came home from SNF one week ago and per family member has been progressively worse, more confused, poor oral intake. Pt is unable to provide history as he rather somnolent and tired and family who was at bedside earlier provided most of the details. Pt apparently had shortness of breat at rest and with exertion. Family also noted visual hallucinations and altered level of consciousness. No reported fevers, chills, chest pain, no abdominal concerns reported.     PT Comments    Patient is planning to DC home with hospice care. Daughter is assisting with home set up. Will plan on staying the night with patient and is looking into increased assistance at home through caregivers. Patient wife is at home but cannot provide physical assist. Patient appeared distracted by going home this session.   Follow Up Recommendations        Equipment Recommendations  3in1 (PT)    Recommendations for Other Services       Precautions / Restrictions Precautions Precautions: Fall    Mobility  Bed Mobility Overal bed mobility: Modified Independent                Transfers       Sit to Stand: Min guard         General transfer comment: Cues for safety and hand placement  Ambulation/Gait Ambulation/Gait assistance: Min assist Ambulation Distance (Feet): 80 Feet Assistive device: Rolling walker (2 wheeled) Gait Pattern/deviations: Step-through pattern;Decreased stride length;Narrow base of support;Scissoring     General Gait Details: CUes for safety and positioning of RW. Patient with two noted LOB with min a to prevent fall. Daughter watching patient ambulate   Stairs            Wheelchair  Mobility    Modified Rankin (Stroke Patients Only)       Balance                                    Cognition Arousal/Alertness: Awake/alert Behavior During Therapy: WFL for tasks assessed/performed Overall Cognitive Status: Within Functional Limits for tasks assessed                      Exercises      General Comments        Pertinent Vitals/Pain 3/4 dyspnea with ambulation.     Home Living                      Prior Function            PT Goals (current goals can now be found in the care plan section) Progress towards PT goals: Progressing toward goals    Frequency  Min 3X/week    PT Plan Current plan remains appropriate    End of Session Equipment Utilized During Treatment: Gait belt Activity Tolerance: Patient limited by fatigue Patient left: in bed;with call bell/phone within reach     Time: 1416-1436 PT Time Calculation (min): 20 min  Charges:  $Gait Training: 8-22 mins                    G Codes:      [name],  [name] 03/17/2014, 3:10 PM 03/17/2014 [name] PTA [phone] pager [phone] office

## 2014-03-17 NOTE — Discharge Summary (Signed)
Physician Discharge Summary  [name] ZOX:[id number] DOB: 09/07/1928 DOA: 03/12/2014  PCP: Tillman Abide, MD  Admit date: 03/12/2014 Discharge date: 03/17/2014  Time spent: 35 minutes  Recommendations for Outpatient Follow-up:  1. 24 hour care at home- given sitter list to family 2. Home with hospice 3. DNR 4. repeat chest CT: 1 to 3 months re r/o neoplasm TSH 4-6 weeks   Discharge Diagnoses:  Principal Problem:   Acute encephalopathy Active Problems:   CAD (coronary artery disease)   Atrial fibrillation   COPD   Benign prostatic hypertrophy   Hypothyroidism   Chronic kideny disease stage III  (GFR 30-59 ml/min)   Chronic diastolic heart failure   HCAP (healthcare-associated pneumonia)   Dysphagia, unspecified(787.20)   Essential hypertension, benign   Discharge Condition: improved  Diet recommendation: DYS 3 nectar thick  Filed Weights   03/15/14 0535 03/16/14 0500 03/17/14 0500  Weight: 57.743 kg (127 lb 4.8 oz) 56.019 kg (123 lb 8 oz) 55.43 kg (122 lb 3.2 oz)    History of present illness:  80 year old male with COPD,diastolic CHF, atrial fibrillation on Coumadin, recently discharge from SNF after being hospitalized for PNA. He came home from SNF one week ago and per family member has been progressively worse, more confused, poor oral intake. Pt is unable to provide history as he rather somnolent and tired and family who was at bedside earlier provided most of the details. Pt apparently had shortness of breat at rest and with exertion. Family also noted visual hallucinations and altered level of consciousness. No reported fevers, chills, chest pain, no abdominal concerns reported.  In ED, pt hemodynamically stable, CXR worrisome for PNA, pulmonary vascular congestion. Pt started on ABX and TRH asked to admit to telemetry bed for further evaluation.    Hospital Course:  Acute encephalopathy: likely from infection and hypothyroid state.  Patient has had multiple  recent admissions, and was at [facility] for >1 month. Only at home for about 1 week. home with hospice   HCAP (healthcare-associated pneumonia) v. Aspiration: CT confirms new RUL infiltrate. Has h/o dysphagia, and was most recently discharged from the hospital on dysphagia 2 diet with nectar thickened liquids. Family reports diet remained thickened liquids, but remained on thins between meals. continue Strict aspiration precautions. Treated with IV abx for time in hospital- change to PO  -Recommend follow-up CT of lung in 1 to 3 months to r/o neoplasm   Chronic diastolic heart failure: ProBNP is about 1000 which is actually lower than previous.   CAD (coronary artery disease)  Atrial fibrillation  COPD  Benign prostatic hypertrophy   Hypothyroidism: TSH >50. Synthroid increasedl; repeat TSH outpatient Chronic kideny disease stage III (GFR 30-59 ml/min)  Dysphagia, unspecified(787.20): DYS diet  Essential hypertension, benign     Consultations:  palliative care  Discharge Exam: Filed Vitals:   03/17/14 0602  BP: 105/53  Pulse: 61  Temp: 98.6 F (37 C)  Resp: 16    General: anxious to go home- "my wife will take better care of me than I can get here" Cardiovascular: irr Respiratory: clear  Discharge Instructions      Discharge Orders   Future Appointments Provider Department Dept Phone   05/18/2014 9:30 AM Karie Schwalbe, MD [facility] [phone]   Future Orders Complete By Expires   Discharge instructions  As directed    Comments:     Hospice at home DYS 3 diet with nectar thick PT/INR check on Monday (patient on abx  so may interfere with INR)   Increase activity slowly  As directed        Medication List         acetaminophen 325 MG tablet  Commonly known as:  TYLENOL  Take 2 tablets (650 mg total) by mouth every 6 (six) hours as needed for mild pain, fever or headache.     amiodarone 200 MG tablet  Commonly known as:  PACERONE   Take 1 tablet (200 mg total) by mouth 2 (two) times daily.     amitriptyline 25 MG tablet  Commonly known as:  ELAVIL  Take 25 mg by mouth at bedtime.     aspirin 81 MG chewable tablet  Chew 1 tablet (81 mg total) by mouth daily.     bisacodyl 10 MG suppository  Commonly known as:  DULCOLAX  Place 1 suppository (10 mg total) rectally daily as needed for moderate constipation.     budesonide 0.25 MG/2ML nebulizer solution  Commonly known as:  PULMICORT  Take 2 mLs (0.25 mg total) by nebulization 2 (two) times daily.     carvedilol 3.125 MG tablet  Commonly known as:  COREG  Take 3.125 mg by mouth 2 (two) times daily with a meal.     diltiazem 300 MG 24 hr capsule  Commonly known as:  CARDIZEM CD  Take 1 capsule (300 mg total) by mouth daily.     feeding supplement (ENSURE) Pudg  Take 1 Container by mouth 3 (three) times daily between meals.     fluticasone 50 MCG/ACT nasal spray  Commonly known as:  FLONASE  Place 2 sprays into the nose daily. 2 spray, Each Nare, Daily     food thickener Powd  Commonly known as:  THICK IT  As needed to make liquids nectar consistently     furosemide 40 MG tablet  Commonly known as:  LASIX  Take 1 tablet (40 mg total) by mouth daily.     guaiFENesin 100 MG/5ML Soln  Commonly known as:  ROBITUSSIN  Take 5 mLs by mouth every 4 (four) hours as needed for cough or to loosen phlegm.     HYDROcodone-acetaminophen 10-325 MG per tablet  Commonly known as:  NORCO  Take 1 tablet by mouth every 8 (eight) hours as needed for severe pain.     ipratropium 17 MCG/ACT inhaler  Commonly known as:  ATROVENT HFA  Inhale 2 puffs into the lungs 4 (four) times daily.     levofloxacin 750 MG tablet  Commonly known as:  LEVAQUIN  Take 1 tablet (750 mg total) by mouth daily.     levothyroxine 100 MCG tablet  Commonly known as:  SYNTHROID, LEVOTHROID  Take 1 tablet (100 mcg total) by mouth daily before breakfast.     nitroGLYCERIN 0.4 MG SL tablet   Commonly known as:  NITROSTAT  Place 0.4 mg under the tongue every 5 (five) minutes as needed for chest pain. x3 doses for for chest pain     pantoprazole 40 MG tablet  Commonly known as:  PROTONIX  Take 1 tablet (40 mg total) by mouth daily.     polyethylene glycol packet  Commonly known as:  MIRALAX / GLYCOLAX  Take 17 g by mouth daily as needed for moderate constipation.     potassium chloride SA 20 MEQ tablet  Commonly known as:  K-DUR,KLOR-CON  Take 1 tablet (20 mEq total) by mouth daily.     PROAIR HFA 108 (90 BASE) MCG/ACT inhaler  Generic drug:  albuterol  Inhale 2 puffs into the lungs every 4 (four) hours as needed for wheezing or shortness of breath.     RAPAFLO 8 MG Caps capsule  Generic drug:  silodosin  Take 8 mg by mouth daily.     senna-docusate 8.6-50 MG per tablet  Commonly known as:  Senokot-S  Take 2 tablets by mouth at bedtime as needed for mild constipation.     warfarin 5 MG tablet  Commonly known as:  COUMADIN  - Take 2.5-5 mg by mouth every morning. Takes 2.5mg  on Tues, Thurs, Sat   - Takes 5mg  all other days       Allergies  Allergen Reactions  . Fenofibrate Other (See Comments)    REACTION: "ran me up a wall"  . Penicillins Other (See Comments)    blisters  . Atorvastatin Other (See Comments)    REACTION: severe muscle aches  . Ezetimibe-Simvastatin Other (See Comments)    REACTION: severe muscle aches      The results of significant diagnostics from this hospitalization (including imaging, microbiology, ancillary and laboratory) are listed below for reference.    Significant Diagnostic Studies: Dg Chest 2 View  03/12/2014   CLINICAL DATA:  80 year old male with altered mental status.  EXAM: CHEST  2 VIEW  COMPARISON:  01/20/2014 and prior chest radiographs.  FINDINGS: There has been significant decrease and right upper lobe airspace disease since 01/20/2014, but persistent streaky and right perihilar opacity identified. An obstructing  central lesion is difficult to exclude and consider chest CT with contrast for further evaluation.  Small bilateral pleural effusions are now noted.  Pulmonary vascular congestion is present.  New interstitial opacities within the left mid and lower lung are noted -question infection, aspiration or interstitial edema.  COPD/emphysema again identified with a right lower lobe bulla.  There is no evidence pneumothorax or acute bony abnormality.  IMPRESSION: New left mid and lower lung interstitial opacites - question infection, aspiration or asymmetric edema.  New small bilateral pleural effusions and pulmonary vascular congestion.  Decreased right upper lobe airspace disease but persistent streaky opacity and right perihilar prominence could represent an obstructing central lesion. Consider chest CT with contrast for further evaluation.  COPD/emphysema.   Electronically Signed   By: Laveda Abbe M.D.   On: 03/12/2014 18:39   Ct Head Wo Contrast  03/12/2014   CLINICAL DATA:  Altered mental status, hallucinations  EXAM: CT HEAD WITHOUT CONTRAST  TECHNIQUE: Contiguous axial images were obtained from the base of the skull through the vertex without intravenous contrast.  COMPARISON:  01/21/2014  FINDINGS: No evidence of parenchymal hemorrhage or extra-axial fluid collection. No mass lesion, mass effect, or midline shift.  No CT evidence of acute infarction.  Subcortical white matter and periventricular small vessel ischemic changes. Intracranial atherosclerosis.  Mild global cortical atrophy with secondary ventricular prominence.  The visualized paranasal sinuses are essentially clear. The mastoid air cells are unopacified.  No evidence of calvarial fracture.  IMPRESSION: No evidence of acute intracranial abnormality.  Atrophy with small vessel ischemic changes and intracranial atherosclerosis   Electronically Signed   By: Charline Bills M.D.   On: 03/12/2014 17:08   Ct Chest W Contrast  03/13/2014   CLINICAL DATA:   Short of breath  EXAM: CT CHEST WITH CONTRAST  TECHNIQUE: Multidetector CT imaging of the chest was performed during intravenous contrast administration.  CONTRAST:  80mL OMNIPAQUE IOHEXOL 300 MG/ML  SOLN  COMPARISON:  DG CHEST 2 VIEW  dated 03/12/2014; CT CHEST W/O CM dated 02/23/2013  FINDINGS: There is new consolidative airspace disease within the right middle lobe. There is mild airspace disease in the lingula. There is severe emphysematous change throughout the lungs with bullous change the lung bases. There is a partially loculated left pleural effusion. There is left basilar atelectasis versus consolidation. Left lower lobe atelectasis is similar to prior as well as the fusion. . The upper lobe airspace disease is new.  No axillary or supraclavicular lymphadenopathy. No mediastinal hilar lymphadenopathy. No pericardial fluid. Coronary calcifications are present. Esophagus is normal.  No pulmonary embolism.  There is thickening of the left adrenal gland to 16 mm which is similar prior. No aggressive osseous lesion.  IMPRESSION: 1. New consolidated airspace disease in the right upper lobe is likely infectious or inflammatory however cannot exclude a neoplasm. Recommend follow-up CT in 1 to 3 months. 2. Ground-glass opacities in the upper lobes likely represent pulmonary edema. 3. Chronic left pleural effusion and basilar atelectasis.   Electronically Signed   By: Genevive Bi M.D.   On: 03/13/2014 00:11    Microbiology: Recent Results (from the past 240 hour(s))  GRAM STAIN     Status: None   Collection Time    03/12/14  5:45 PM      Result Value Ref Range Status   Specimen Description URINE, RANDOM   Final   Special Requests NONE   Final   Gram Stain     Final   Value: CYTOSPIN SLIDE     WBC PRESENT, PREDOMINANTLY MONONUCLEAR     GRAM NEGATIVE RODS     BACTERIA ATTACHED TO EPITHELIAL CELLS   Report Status 03/12/2014 FINAL   Final  CULTURE, BLOOD (ROUTINE X 2)     Status: None   Collection  Time    03/13/14 12:01 AM      Result Value Ref Range Status   Specimen Description BLOOD LEFT HAND   Final   Special Requests BOTTLES DRAWN AEROBIC ONLY 2CC   Final   Culture  Setup Time     Final   Value: 03/13/2014 13:44     Performed at [facility]   Culture     Final   Value:        BLOOD CULTURE RECEIVED NO GROWTH TO DATE CULTURE WILL BE HELD FOR 5 DAYS BEFORE ISSUING A FINAL NEGATIVE REPORT     Performed at [facility]   Report Status PENDING   Incomplete  CULTURE, BLOOD (ROUTINE X 2)     Status: None   Collection Time    03/13/14 12:05 AM      Result Value Ref Range Status   Specimen Description BLOOD RIGHT ARM   Final   Special Requests     Final   Value: BOTTLES DRAWN AEROBIC AND ANAEROBIC 10CC BLUE,6CC RED   Culture  Setup Time     Final   Value: 03/13/2014 13:44     Performed at [facility]   Culture     Final   Value:        BLOOD CULTURE RECEIVED NO GROWTH TO DATE CULTURE WILL BE HELD FOR 5 DAYS BEFORE ISSUING A FINAL NEGATIVE REPORT     Performed at [facility]   Report Status PENDING   Incomplete     Labs: Basic Metabolic Panel:  Recent Labs Lab 03/12/14 1648 03/13/14 0620 03/16/14 0540  NA 139 138 139  K 4.5 3.9 3.5*  CL  102 103 104  CO2 26 22 21   GLUCOSE 82 88 96  BUN 15 13 24*  CREATININE 1.22 1.08 1.05  CALCIUM 9.1 9.0 8.9  MG 2.7*  --   --    Liver Function Tests:  Recent Labs Lab 03/12/14 1648  AST 20  ALT 15  ALKPHOS 91  BILITOT 0.3  PROT 7.2  ALBUMIN 2.8*   No results found for this basename: LIPASE, AMYLASE,  in the last 168 hours No results found for this basename: AMMONIA,  in the last 168 hours CBC:  Recent Labs Lab 03/12/14 1648 03/13/14 0620 03/16/14 0540  WBC 7.3 7.1 8.8  NEUTROABS 5.7  --   --   HGB 10.7* 9.6* 9.2*  HCT 32.8* 29.7* 28.6*  MCV 90.9 90.3 89.9  PLT 281 251 274   Cardiac Enzymes: No results found for this basename: CKTOTAL, CKMB, CKMBINDEX, TROPONINI,  in the  last 168 hours BNP: BNP (last 3 results)  Recent Labs  01/08/14 2225 01/20/14 2159 03/13/14  PROBNP 1259.0* 1528.0* 1065.0*   CBG:  Recent Labs Lab 03/12/14 1640  GLUCAP 78       Signed:  Katilynn Sinkler  [facility] 03/17/2014, 2:21 PM

## 2014-03-17 NOTE — Telephone Encounter (Signed)
Spoke with hospice and advised results.  Spoke with daughter and she would love home visits if they are covered by insurance

## 2014-03-18 ENCOUNTER — Ambulatory Visit: Payer: Medicare Other | Admitting: Internal Medicine

## 2014-03-18 ENCOUNTER — Telehealth: Payer: Self-pay

## 2014-03-18 MED ORDER — BUDESONIDE 0.25 MG/2ML IN SUSP: 0.2500 mg | Freq: Two times a day (BID) | RESPIRATORY_TRACT | Status: DC

## 2014-03-18 NOTE — Telephone Encounter (Signed)
noted 

## 2014-03-18 NOTE — Telephone Encounter (Signed)
[name]; pt was discharged from hospital with pulmicort neb treatment; pt brought home prescription with another persons name of prescription so pt could not get pulmicort nebulizer solution. Maryruth HancockAnn [name] request new rx of pulmicort sent to [facility]. Maryruth HancockAnn [name] request cb when done.

## 2014-03-18 NOTE — Telephone Encounter (Signed)
Spoke with hospice and advised results Pt's nurse will be Rozanna BoxNicole Nicholson [phone]

## 2014-03-18 NOTE — Telephone Encounter (Signed)
Let her know that I sent the prescription Also, get her cell number for my records I am planning to make a home visit to see him on 4/8 around 2:30 or so It would be good if she can join me

## 2014-03-19 LAB — CULTURE, BLOOD (ROUTINE X 2)
CULTURE: NO GROWTH
Culture: NO GROWTH

## 2014-03-21 ENCOUNTER — Ambulatory Visit: Payer: Medicare Other | Admitting: Internal Medicine

## 2014-03-21 DIAGNOSIS — I509 Heart failure, unspecified: Secondary | ICD-10-CM

## 2014-03-21 DIAGNOSIS — J449 Chronic obstructive pulmonary disease, unspecified: Secondary | ICD-10-CM

## 2014-03-21 DIAGNOSIS — I4891 Unspecified atrial fibrillation: Secondary | ICD-10-CM

## 2014-03-21 DIAGNOSIS — J189 Pneumonia, unspecified organism: Secondary | ICD-10-CM

## 2014-03-22 ENCOUNTER — Ambulatory Visit (INDEPENDENT_AMBULATORY_CARE_PROVIDER_SITE_OTHER): Payer: Medicare Other | Admitting: Family Medicine

## 2014-03-22 DIAGNOSIS — Z5181 Encounter for therapeutic drug level monitoring: Secondary | ICD-10-CM

## 2014-03-22 LAB — POCT INR: INR: >8

## 2014-03-24 ENCOUNTER — Ambulatory Visit (INDEPENDENT_AMBULATORY_CARE_PROVIDER_SITE_OTHER): Payer: Medicare Other | Admitting: Family Medicine

## 2014-03-24 DIAGNOSIS — Z5181 Encounter for therapeutic drug level monitoring: Secondary | ICD-10-CM

## 2014-03-24 LAB — POCT INR: INR: 7.3

## 2014-03-29 ENCOUNTER — Ambulatory Visit (INDEPENDENT_AMBULATORY_CARE_PROVIDER_SITE_OTHER): Payer: Medicare Other | Admitting: Family Medicine

## 2014-03-29 DIAGNOSIS — Z7901 Long term (current) use of anticoagulants: Secondary | ICD-10-CM

## 2014-03-29 DIAGNOSIS — Z5181 Encounter for therapeutic drug level monitoring: Secondary | ICD-10-CM

## 2014-03-29 DIAGNOSIS — I4891 Unspecified atrial fibrillation: Secondary | ICD-10-CM

## 2014-03-29 LAB — POCT INR: INR: 1.7

## 2014-03-30 ENCOUNTER — Ambulatory Visit: Payer: Medicare Other | Admitting: Internal Medicine

## 2014-03-30 ENCOUNTER — Telehealth: Payer: Self-pay

## 2014-03-30 ENCOUNTER — Encounter: Payer: Self-pay | Admitting: Internal Medicine

## 2014-03-30 VITALS — BP 106/54 | HR 52 | Resp 16

## 2014-03-30 DIAGNOSIS — R1310 Dysphagia, unspecified: Secondary | ICD-10-CM

## 2014-03-30 DIAGNOSIS — I2510 Atherosclerotic heart disease of native coronary artery without angina pectoris: Secondary | ICD-10-CM

## 2014-03-30 DIAGNOSIS — I5032 Chronic diastolic (congestive) heart failure: Secondary | ICD-10-CM

## 2014-03-30 DIAGNOSIS — J449 Chronic obstructive pulmonary disease, unspecified: Secondary | ICD-10-CM

## 2014-03-30 DIAGNOSIS — R4182 Altered mental status, unspecified: Secondary | ICD-10-CM

## 2014-03-30 DIAGNOSIS — E43 Unspecified severe protein-calorie malnutrition: Secondary | ICD-10-CM

## 2014-03-30 DIAGNOSIS — I4891 Unspecified atrial fibrillation: Secondary | ICD-10-CM

## 2014-03-30 NOTE — Assessment & Plan Note (Signed)
Dry now Will change furosemide to prn only

## 2014-03-30 NOTE — Assessment & Plan Note (Signed)
Discussed that thickened liquids may not prevent aspiration but will cause dehydration Will offer thin liquids Can still thicken prn

## 2014-03-30 NOTE — Telephone Encounter (Signed)
Spoke with daughter she states pt is coughing some but not much and she will be there for the visit

## 2014-03-30 NOTE — Progress Notes (Signed)
Subjective:    Patient ID: [name], male    DOB: March 04, 1928, 79 y.o.   MRN: [id number]  HPI Wife and daughter are here [name] the hospice nurse Tammy--home health care worker is here.  Hospitalized for pneumonia Then to rehab Readmitted with confusion and aspiration pneumonitis Now back home with hospice care  Had been okay till 2 days ago Confused Found on floor Hospice nurse has had to come out the last 2 nights No apparent fever No known choking spell Some concern for recurrent aspiration based on the recurrence of confusion  Breathing is okay Some cough but not bad No chest pain No apparent palpitations  Not eating much Mechanical soft diet On thickened liquids (nectar) --not drinking much  Current Outpatient Prescriptions on File Prior to Visit  Medication Sig Dispense Refill  . acetaminophen (TYLENOL) 325 MG tablet Take 2 tablets (650 mg total) by mouth every 6 (six) hours as needed for mild pain, fever or headache.      Marland Kitchen aspirin 81 MG chewable tablet Chew 1 tablet (81 mg total) by mouth daily.      . bisacodyl (DULCOLAX) 10 MG suppository Place 1 suppository (10 mg total) rectally daily as needed for moderate constipation.  12 suppository  0  . budesonide (PULMICORT) 0.25 MG/2ML nebulizer solution Take 2 mLs (0.25 mg total) by nebulization 2 (two) times daily.  60 mL  12  . carvedilol (COREG) 3.125 MG tablet Take 3.125 mg by mouth 2 (two) times daily with a meal.      . feeding supplement, ENSURE, (ENSURE) PUDG Take 1 Container by mouth 3 (three) times daily between meals.    0  . fluticasone (FLONASE) 50 MCG/ACT nasal spray Place 2 sprays into the nose daily. 2 spray, Each Nare, Daily  16 g  0  . food thickener (THICK IT) POWD As needed to make liquids nectar consistently    0  . guaiFENesin (ROBITUSSIN) 100 MG/5ML SOLN Take 5 mLs by mouth every 4 (four) hours as needed for cough or to loosen phlegm.      Marland Kitchen levothyroxine (SYNTHROID, LEVOTHROID) 100 MCG  tablet Take 1 tablet (100 mcg total) by mouth daily before breakfast.  30 tablet  0  . nitroGLYCERIN (NITROSTAT) 0.4 MG SL tablet Place 0.4 mg under the tongue every 5 (five) minutes as needed for chest pain. x3 doses for for chest pain      . polyethylene glycol (MIRALAX / GLYCOLAX) packet Take 17 g by mouth daily as needed for moderate constipation.  14 each  0  . RAPAFLO 8 MG CAPS Take 8 mg by mouth daily.       Marland Kitchen senna-docusate (SENOKOT-S) 8.6-50 MG per tablet Take 2 tablets by mouth at bedtime as needed for mild constipation.  60 tablet  11   No current facility-administered medications on file prior to visit.    Allergies  Allergen Reactions  . Fenofibrate Other (See Comments)    REACTION: "ran me up a wall"  . Penicillins Other (See Comments)    blisters  . Atorvastatin Other (See Comments)    REACTION: severe muscle aches  . Ezetimibe-Simvastatin Other (See Comments)    REACTION: severe muscle aches    Past Medical History  Diagnosis Date  . CAD (coronary artery disease)   . Hyperlipidemia   . Kidney stones   . BPH (benign prostatic hypertrophy)   . Osteoarthritis   . COPD (chronic obstructive pulmonary disease)   . Hypothyroidism   .  Atrial fibrillation   . Impaired fasting glucose   . Myocardial infarction   . Hypertension   . Shortness of breath   . CHF (congestive heart failure)   . GERD (gastroesophageal reflux disease)     Past Surgical History  Procedure Laterality Date  . Inguinal hernia repair  09/2004    left  . Back surgery  1990's    x3  . Transurethral resection of prostate  2002  . Pleural scarification  1980    right  . Esophagogastroduodenoscopy  04/2002    negative  . Bladder stone removal  06/2006  . Colonoscopy Left 04/13/2013    Procedure: COLONOSCOPY;  Surgeon: Willis ModenaWilliam Outlaw, MD;  [facility]: [facility]COPY;  Service: Endoscopy;  Laterality: Left;    Family History  Problem Relation Age of Onset  . Heart disease Mother   . Heart  disease Brother   . Stroke Brother   . Cancer Neg Hx   . Diabetes Neg Hx   . Hypertension Neg Hx   . Heart disease Brother   . Stroke Brother   . Heart disease Brother   . Stroke Brother     History   Social History  . Marital Status: Married    Spouse Name: N/A    Number of Children: 1  . Years of Education: N/A   Occupational History  . retired Therapist, [employer]    Social History Main Topics  . Smoking status: Former Smoker -- 1.00 packs/day for 60 years    Types: Cigarettes    Quit date: 12/24/2011  . Smokeless tobacco: Never Used  . Alcohol Use: No  . Drug Use: No  . Sexual Activity: Not on file   Other Topics Concern  . Not on file   Social History Narrative   Has living will   No [place] health care POA. Asks for daughter Ardeen FillersLisa Bray to make decisions if needed.   Has DNR--done while in hospital   Not sure about tube feeds   Review of Systems Had been sleeping okay till last 2 nights Bowels have been okay---last 2 days ago    Objective:   Physical Exam  Constitutional: No distress.  HENT:  Mouth/Throat: Oropharynx is clear and moist. No oropharyngeal exudate.  Dry mucous membranes  Neck: Normal range of motion. Neck supple.  Cardiovascular: Regular rhythm.  Exam reveals no gallop.   Slow but regular  Pulmonary/Chest: Effort normal. No respiratory distress. He has no wheezes. He has no rales.  Decreased breath sounds but clear  Abdominal: Soft. There is no tenderness.  Musculoskeletal:  1-2+ edema in left foot only  Lymphadenopathy:    He has no cervical adenopathy.  Neurological:  Confused Doesn't know his daughter or wife at first---then recognized wife No focal weakness  Psychiatric:  Restless but not depressed or significantly anxious          Assessment & Plan:

## 2014-03-30 NOTE — Assessment & Plan Note (Signed)
Severe and worse after pneumonia Will continue nebs and oxygen

## 2014-03-30 NOTE — Telephone Encounter (Signed)
Jessica with [facility] saw pt 03/28/14, Monday night; pt was restless and agitated;was given Ativan. Shanda BumpsJessica returned to pt 2 AM this morning. Ativan was not effective; Ativan was stopped and pt given Haldol 1 mg two tablets again without effectiveness. Shanda BumpsJessica is questioning if pt has aspirated. Shanda BumpsJessica wants to know if Dr Alphonsus SiasLetvak will prescribe antibiotic to [facility]. Shanda BumpsJessica is getting off duty and request cb to Franky MachoLisa Pts daughter [phone]. Dr Alphonsus SiasLetvak has home visit with pt today at [facility].

## 2014-03-30 NOTE — Assessment & Plan Note (Signed)
Regular now but quite bradycardic Will stop the diltiazem---restart lower if rate goes up Cut down on amiodarone---consider further wean soon. May be causing side effects (though I don't think lung pathology)

## 2014-03-30 NOTE — Assessment & Plan Note (Signed)
Was stable but bad for 2 days Haldol no help---will stop Will try lorazepam----need sitter for night till settles down Increase fluids---may have some dehydration

## 2014-03-30 NOTE — Telephone Encounter (Signed)
okay

## 2014-03-30 NOTE — Assessment & Plan Note (Signed)
Will try ensure with ice cream Decrease pill burden and hopefully he will eat better

## 2014-03-30 NOTE — Telephone Encounter (Signed)
Unless he has fever and cough now, I would prefer to wait for my evaluation later today (I expect to be there around 2:30)

## 2014-03-31 ENCOUNTER — Telehealth: Payer: Self-pay

## 2014-03-31 NOTE — Telephone Encounter (Signed)
Please let her know that I have contacted Joni Reiningicole and given the okay for hospice to use their doctors in an emergency if my covering physician is unavailable

## 2014-03-31 NOTE — Telephone Encounter (Signed)
Spoke with daughter and advised results.  

## 2014-03-31 NOTE — Telephone Encounter (Signed)
[name] pts daughter left v/m requesting Dr Alphonsus SiasLetvak give hospice menu of services which would allow hospice nurse to get intouch with hospice doctor if unable to reach Dr Alphonsus SiasLetvak; [name] said there have been times when could not reach Dr Alphonsus SiasLetvak or doctor on call (usually during the night ex 2 AM or 4 AM). [name] request cb.

## 2014-04-05 ENCOUNTER — Telehealth: Payer: Self-pay | Admitting: Internal Medicine

## 2014-04-05 ENCOUNTER — Other Ambulatory Visit: Payer: Self-pay | Admitting: Family Medicine

## 2014-04-05 MED ORDER — HYDROCODONE-ACETAMINOPHEN 10-325 MG PO TABS: 0.5000 | ORAL_TABLET | Freq: Three times a day (TID) | ORAL | Status: DC | PRN

## 2014-04-05 NOTE — Telephone Encounter (Signed)
Refilled once in PCP absence In IN box for pick up

## 2014-04-05 NOTE — Telephone Encounter (Signed)
Can refill once in PCP absence  Px printed for pick up in IN box

## 2014-04-05 NOTE — Telephone Encounter (Signed)
Last filled 01/25/14.  Dr. Alphonsus SiasLetvak out of office, ok to refill?

## 2014-04-05 NOTE — Telephone Encounter (Signed)
Faxed to pharmacy because patient is a Hospice patient.

## 2014-04-05 NOTE — Telephone Encounter (Signed)
Faxed to pharmacy due to Hospice Patient status.

## 2014-04-05 NOTE — Telephone Encounter (Signed)
Pt's daughter is calling concerning getting a hydrocodone refill and pt is completely out of medication. I saw that there was a message sent back to Dr. Milinda Antisower. Please advise.

## 2014-04-07 ENCOUNTER — Telehealth: Payer: Self-pay

## 2014-04-07 NOTE — Telephone Encounter (Signed)
Continue to hold.

## 2014-04-07 NOTE — Telephone Encounter (Signed)
[name] with [facility] said on 04/05/14 [name] spoke with Dr Alphonsus SiasLetvak; pts heart rate 49 and was advised to hold Coreg 3.125 mg. pt usually takes Coreg 2 x a day. Today pts heart rate is 53. What to do about pt taking Coreg?[name] ReiningNicole request cb.

## 2014-04-07 NOTE — Telephone Encounter (Signed)
Left message on VM with results, advised nurse to call if any further questions

## 2014-04-12 NOTE — Telephone Encounter (Signed)
[name] with [facility] left v/m to update pt condition; pts HR today is 64 apical. [name] ReiningNicole request cb about restarting Coreg.

## 2014-04-13 ENCOUNTER — Telehealth: Payer: Self-pay | Admitting: *Deleted

## 2014-04-13 NOTE — Telephone Encounter (Signed)
Pt's daughter Misty StanleyLisa calling in very upset that the hospice nurse can never speak with Dr. Alphonsus SiasLetvak. Per daughter they have needed to speak to dr. Alphonsus Siasletvak in the middle of the night and couldn't get him, per daughter and nurse the answering service didn't pick-up. Daughter does not want he nurse to speak with me she would like to speak with Dr. Alphonsus Siasletvak and voice her opinions and what she's observing. Per daughter if Dr. Alphonsus SiasLetvak want to remain the PCP he needs to be accessible. Per daughter the nurse has called and paged Dr. Alphonsus SiasLetvak and got nothing. I advised to daughter that Dr. Alphonsus SiasLetvak always answers his phone and pager. I also stated that he is sometimes busy with other patients and that's why I call back with instructions. Per daughter it's not done in a timely manner.

## 2014-04-13 NOTE — Telephone Encounter (Signed)
Have him restart at 3.125 bid Hold if HR under 50 (if they know how to check)

## 2014-04-13 NOTE — Telephone Encounter (Signed)
Spoke with hospice nurse and advised results  

## 2014-04-14 NOTE — Telephone Encounter (Signed)
Spoke to daughter and Marisa Cyphersicole Clarified our contacts, etc  Joni Reiningicole feels he may have improved off the carvedilol--- told her to keep him off it then and only reconsider if HR goes way up (for the time being). May be adversely affecting his COPD

## 2014-04-18 ENCOUNTER — Other Ambulatory Visit: Payer: Self-pay

## 2014-04-18 MED ORDER — HYDROCODONE-ACETAMINOPHEN 10-325 MG PO TABS: 0.5000 | ORAL_TABLET | Freq: Three times a day (TID) | ORAL | Status: DC | PRN

## 2014-04-18 NOTE — Telephone Encounter (Signed)
Spoke with hospice nurse and advised results Spoke with daughter and advised results. rx faxed to pharmacy manually

## 2014-04-18 NOTE — Telephone Encounter (Signed)
Pt's daughter is calling concerning refill request. She says her dad is taking 3 whole pills a day and is wanting to know if he could get more than a 30 pills per refill because they are having to get it refilled every 10 days that way. Please advise. Pt's daughter is wanting to know if she could come pick it up before 5:30 today because she is going to see her dad.

## 2014-04-18 NOTE — Telephone Encounter (Signed)
Joni ReiningNicole nurse with [facility] left v/m requesting [facility]codone apap. Hospice pt on rx. Please advise.

## 2014-04-28 ENCOUNTER — Telehealth: Payer: Self-pay | Admitting: Internal Medicine

## 2014-04-28 NOTE — Telephone Encounter (Signed)
Phone call from [place] hospice RN More confused in past couple of days---somewhat combative Vital signs fine Lorazepam some help They are trying to encourage fluids Wonders about urine--but no fever and no symptoms. Will hold off on this

## 2014-05-12 ENCOUNTER — Other Ambulatory Visit: Payer: Self-pay

## 2014-05-12 MED ORDER — HYDROCODONE-ACETAMINOPHEN 10-325 MG PO TABS: 0.5000 | ORAL_TABLET | Freq: Three times a day (TID) | ORAL | Status: DC | PRN

## 2014-05-12 NOTE — Telephone Encounter (Signed)
rx faxed to pharmacy manually  

## 2014-05-12 NOTE — Telephone Encounter (Signed)
[name] left v/m; pt takes Norco 10-325 mg one tab three times a day; pt has 18 tabs left but with holiday Renee request rx [facility]ed to [facility] [address]. Renee request cb.

## 2014-05-18 ENCOUNTER — Encounter: Payer: Medicare Other | Admitting: Internal Medicine

## 2014-05-23 ENCOUNTER — Telehealth: Payer: Self-pay | Admitting: Internal Medicine

## 2014-05-23 NOTE — Telephone Encounter (Signed)
Daughter Ardeen Fillers called, pt has been asking for Dr. Alphonsus Sias.  Would like to know when Dr. Alphonsus Sias can return to vist.  Best number to call daughter Ardeen Fillers is [phone].

## 2014-05-23 NOTE — Telephone Encounter (Signed)
Message left for her I will try to get out there next Wednesday afternoon Discussed with [name] the hospice nurse and patient's sister who answered phone (he was asleep)

## 2014-05-24 ENCOUNTER — Telehealth: Payer: Self-pay | Admitting: Internal Medicine

## 2014-05-24 NOTE — Telephone Encounter (Signed)
Call from [facility] Has required the furosemide about once a week to keep fluid down Daughter wonders about scheduling regular dose once a week  Since he is seen twice a week regularly by [name], we can let her monitor weight and decide about dosing We can discuss this further at my home visit next week

## 2014-06-01 ENCOUNTER — Encounter: Payer: Self-pay | Admitting: Internal Medicine

## 2014-06-01 ENCOUNTER — Ambulatory Visit: Admitting: Internal Medicine

## 2014-06-01 VITALS — BP 122/58 | HR 62 | Resp 18 | Wt 117.0 lb

## 2014-06-01 DIAGNOSIS — E43 Unspecified severe protein-calorie malnutrition: Secondary | ICD-10-CM

## 2014-06-01 DIAGNOSIS — I2510 Atherosclerotic heart disease of native coronary artery without angina pectoris: Secondary | ICD-10-CM

## 2014-06-01 DIAGNOSIS — R443 Hallucinations, unspecified: Secondary | ICD-10-CM

## 2014-06-01 DIAGNOSIS — J449 Chronic obstructive pulmonary disease, unspecified: Secondary | ICD-10-CM

## 2014-06-01 DIAGNOSIS — F0150 Vascular dementia without behavioral disturbance: Secondary | ICD-10-CM

## 2014-06-01 DIAGNOSIS — M519 Unspecified thoracic, thoracolumbar and lumbosacral intervertebral disc disorder: Secondary | ICD-10-CM

## 2014-06-01 DIAGNOSIS — I5032 Chronic diastolic (congestive) heart failure: Secondary | ICD-10-CM

## 2014-06-01 NOTE — Progress Notes (Signed)
Subjective:    Patient ID: [name], male    DOB: Aug 09, 1928, 80 y.o.   MRN: [id number]  HPI Wife and daughter [name] are here [name], hospice nurse here  Not doing well Has frequent hallucinations--- has seen his dad Confusion--- thinks he is not home Worries about missing truck--- daughter had taken it to clean  Has trouble with legs No strength to walk --especially in the morning Has had some minor falls He also has AM pain Still with the back pain Trying to limit the hydrocodone--but daughter concerned he is using more than 3 per day  Did have another spell--- like neurologic decline ?TIA  Did need some lorazepam then---but generally hasn't been getting  Breathing okay Not using the oxygen much No fever or infection Some cough--occasionally has sputum  Rare chest pain---may bother him when his legs hurt in the morning No palpitations Pharmacy filled amiodarone Rx as bid---discussed that I want it daily only  Appetite still not good Just won't drink the ensure--even with ice cream Has been on thin liquids and doing okay with this  Family still has 2 aides to help with care Usually here 9-12 and 4-7 during week 2 hours bid on weekends Extra help (like overnight) when "out of his head"  Current Outpatient Prescriptions on File Prior to Visit  Medication Sig Dispense Refill  . acetaminophen (TYLENOL) 325 MG tablet Take 2 tablets (650 mg total) by mouth every 6 (six) hours as needed for mild pain, fever or headache.      . albuterol (2.5 MG/3ML) 0.083% NEBU 3 mL, albuterol (5 MG/ML) 0.5% NEBU 0.5 mL Inhale 5 mg into the lungs 2 (two) times daily. With budesonide and every 2 hours as needed      . amiodarone (PACERONE) 200 MG tablet Take 200 mg by mouth daily.      Marland Kitchen aspirin 81 MG chewable tablet Chew 1 tablet (81 mg total) by mouth daily.      . bisacodyl (DULCOLAX) 10 MG suppository Place 1 suppository (10 mg total) rectally daily as needed for moderate  constipation.  12 suppository  0  . budesonide (PULMICORT) 0.25 MG/2ML nebulizer solution Take 2 mLs (0.25 mg total) by nebulization 2 (two) times daily.  60 mL  12  . feeding supplement, ENSURE, (ENSURE) PUDG Take 1 Container by mouth 3 (three) times daily between meals.    0  . furosemide (LASIX) 40 MG tablet Take 40 mg by mouth daily as needed.      Marland Kitchen guaiFENesin (ROBITUSSIN) 100 MG/5ML SOLN Take 5 mLs by mouth every 4 (four) hours as needed for cough or to loosen phlegm.      Marland Kitchen HYDROcodone-acetaminophen (NORCO) 10-325 MG per tablet Take 0.5-1 tablets by mouth 3 (three) times daily as needed for severe pain.  90 tablet  0  . levothyroxine (SYNTHROID, LEVOTHROID) 100 MCG tablet Take 1 tablet (100 mcg total) by mouth daily before breakfast.  30 tablet  0  . LORazepam (ATIVAN) 0.5 MG tablet Take 0.5-1 mg by mouth every 4 (four) hours as needed for anxiety.      . nitroGLYCERIN (NITROSTAT) 0.4 MG SL tablet Place 0.4 mg under the tongue every 5 (five) minutes as needed for chest pain. x3 doses for for chest pain      . polyethylene glycol (MIRALAX / GLYCOLAX) packet Take 17 g by mouth daily as needed for moderate constipation.  14 each  0  . potassium chloride SA (K-DUR,KLOR-CON) 20 MEQ tablet  Take 20 mEq by mouth daily as needed. If the furosemide is given      . RAPAFLO 8 MG CAPS Take 8 mg by mouth daily.       Marland Kitchen. senna-docusate (SENOKOT-S) 8.6-50 MG per tablet Take 2 tablets by mouth at bedtime as needed for mild constipation.  60 tablet  11   No current facility-administered medications on file prior to visit.    Allergies  Allergen Reactions  . Fenofibrate Other (See Comments)    REACTION: "ran me up a wall"  . Penicillins Other (See Comments)    blisters  . Atorvastatin Other (See Comments)    REACTION: severe muscle aches  . Ezetimibe-Simvastatin Other (See Comments)    REACTION: severe muscle aches    Past Medical History  Diagnosis Date  . CAD (coronary artery disease)   .  Hyperlipidemia   . Kidney stones   . BPH (benign prostatic hypertrophy)   . Osteoarthritis   . COPD (chronic obstructive pulmonary disease)   . Hypothyroidism   . Atrial fibrillation   . Impaired fasting glucose   . Myocardial infarction   . Hypertension   . Shortness of breath   . CHF (congestive heart failure)   . GERD (gastroesophageal reflux disease)     Past Surgical History  Procedure Laterality Date  . Inguinal hernia repair  09/2004    left  . Back surgery  1990's    x3  . Transurethral resection of prostate  2002  . Pleural scarification  1980    right  . Esophagogastroduodenoscopy  04/2002    negative  . Bladder stone removal  06/2006  . Colonoscopy Left 04/13/2013    Procedure: COLONOSCOPY;  Surgeon: Willis ModenaWilliam Outlaw, MD;  [facility];  Service: Endoscopy;  Laterality: Left;    Family History  Problem Relation Age of Onset  . Heart disease Mother   . Heart disease Brother   . Stroke Brother   . Cancer Neg Hx   . Diabetes Neg Hx   . Hypertension Neg Hx   . Heart disease Brother   . Stroke Brother   . Heart disease Brother   . Stroke Brother     History   Social History  . Marital Status: Married    Spouse Name: N/A    Number of Children: 1  . Years of Education: N/A   Occupational History  . retired Therapist, [employer]    Social History Main Topics  . Smoking status: Former Smoker -- 1.00 packs/day for 60 years    Types: Cigarettes    Quit date: 12/24/2011  . Smokeless tobacco: Never Used  . Alcohol Use: No  . Drug Use: No  . Sexual Activity: Not on file   Other Topics Concern  . Not on file   Social History Narrative   Has living will   No [place] health care POA. Asks for daughter [name] to make decisions if needed.   Has DNR--done while in hospital   Not sure about tube feeds   Review of Systems Generally sleeps okay Bowels are slow--has use one of the meds to help at times Hasn't needed the nitro lately No sig  depression---troubled by his legs and the hallucinations Slow stream at times with urine--rare incontinence (not enough to need pad)    Objective:   Physical Exam  Constitutional: He appears well-developed. No distress.  Neck: Normal range of motion. Neck supple. No thyromegaly present.  Cardiovascular: Normal rate, regular rhythm and normal  heart sounds.  Exam reveals no gallop.   No murmur heard. Pulmonary/Chest: Effort normal. No respiratory distress. He has no wheezes. He has no rales.  Mild decreased breath sounds Slight right basilar rhonchi  Abdominal: Soft. He exhibits no distension. There is no tenderness. There is no rebound and no guarding.  Musculoskeletal:  Trace ankle edema  Lymphadenopathy:    He has no cervical adenopathy.          Assessment & Plan:

## 2014-06-01 NOTE — Assessment & Plan Note (Signed)
Has been stable No infections Still can't walk to the bathroom without dyspnea No changes needed

## 2014-06-01 NOTE — Assessment & Plan Note (Signed)
From repeated mild strokes Probably the cause of the hallucinations also

## 2014-06-01 NOTE — Assessment & Plan Note (Addendum)
And delusions Gets agitated at times but not severe Will continue with the lorazepam Would consider antipsychotics if severe agitation--if needed, would try quetiapine 25-50 at bedtime

## 2014-06-01 NOTE — Assessment & Plan Note (Signed)
Still not eating well They are trying ---he likes hot dogs and chicken wings

## 2014-06-01 NOTE — Assessment & Plan Note (Signed)
Ongoing back and leg pain Long standing narcotic use Discussed changing to 1/2 tab at a time--so then he can take another half if he needs it without overusing it Not sure if this could add to the cognitive issues

## 2014-06-01 NOTE — Assessment & Plan Note (Signed)
Using the furosemide prn only

## 2014-06-02 ENCOUNTER — Encounter: Payer: Self-pay | Admitting: Internal Medicine

## 2014-06-08 ENCOUNTER — Telehealth: Payer: Self-pay

## 2014-06-08 NOTE — Telephone Encounter (Signed)
Pt wants to know if instructions or dosage for hydrocodone apap can be increased. Pt said taking 1 tab 3 times a day is not helping his severe pain that is all over his body. Pt request cb.(pt said the lady that takes care of him monitors when needs new rx so pt is not sure if needs new rx now); last rx was printed on 05/12/14. Pt is hospice pt. [facility].

## 2014-06-08 NOTE — Telephone Encounter (Signed)
I think this is something he should follow up with Dr. Alphonsus SiasLetvak about next week. In his last noted from 6/10, he had been more confused and more falls. Dr. Alphonsus SiasLetvak recommended only taking 1/2 tablet at a time so that if needed he could take the other half later. I don't feel comfortable increasing the quantity without talking with Dr. Alphonsus SiasLetvak.

## 2014-06-10 NOTE — Telephone Encounter (Signed)
Spoke with patient and advised results, he states it's ok.

## 2014-06-12 NOTE — Telephone Encounter (Signed)
I would be okay with him going up to 4 times per day. I would still prefer that he try 1/2 tab before a full one--in case that is enough for him--but he can take up to 1 tab 4 times a day

## 2014-06-13 NOTE — Addendum Note (Signed)
Addended by: Sueanne MargaritaSMITH, DESHANNON L on: 06/13/2014 10:34 AM   Modules accepted: Orders, Medications

## 2014-06-13 NOTE — Telephone Encounter (Signed)
Left detailed message for lisa pt's daughter and advised her to call if any questions

## 2014-06-14 ENCOUNTER — Telehealth: Payer: Self-pay | Admitting: Internal Medicine

## 2014-06-14 NOTE — Telephone Encounter (Signed)
Patient's daughter returned your call.  

## 2014-06-16 NOTE — Telephone Encounter (Signed)
Spoke with patient's daughter and advised results  

## 2014-06-17 ENCOUNTER — Other Ambulatory Visit: Payer: Self-pay

## 2014-06-17 MED ORDER — HYDROCODONE-ACETAMINOPHEN 10-325 MG PO TABS: 0.5000 | ORAL_TABLET | Freq: Four times a day (QID) | ORAL | Status: DC | PRN

## 2014-06-17 NOTE — Telephone Encounter (Signed)
Joni [facility]Nicole with [facility] said pts daughter request rx for Norco [facility]ed [facility]; make notation on rx Hospice pt.Joni Reiningicole is not sure if pt has med to last thru the weekend.

## 2014-06-17 NOTE — Telephone Encounter (Signed)
Left message for Joni Reiningicole that rx was manually faxed to pharmacy

## 2014-07-06 ENCOUNTER — Telehealth: Payer: Self-pay | Admitting: Internal Medicine

## 2014-07-06 NOTE — Telephone Encounter (Signed)
Called by Joni ReiningNicole, hospice RN, yesterday  Having urinary hesitancy and some low back pain No fever Output seems okay  She is monitoring this I suggested a straight cath to check PVR If >>300, would need to leave in catheter

## 2014-07-28 ENCOUNTER — Telehealth: Payer: Self-pay | Admitting: Internal Medicine

## 2014-07-28 MED ORDER — HYDROCODONE-ACETAMINOPHEN 10-325 MG PO TABS: 0.5000 | ORAL_TABLET | Freq: Four times a day (QID) | ORAL | Status: DC | PRN

## 2014-07-28 NOTE — Telephone Encounter (Signed)
Left message for hospice nurse  Spoke with daughter and advised rx sent in

## 2014-07-28 NOTE — Telephone Encounter (Signed)
Phone call from ChandlerNicole --hospice RN Some confusion--seemed dry and is some better with increased fluids Wife is concerned about him not sleeping well--and that affects her too Wonders about going back on prior med  Discussed that it was amitriptylline and that can cause more confusion  Okay to try the lorazepam cautiously if he is not sleeping well  [facility]

## 2014-08-10 ENCOUNTER — Encounter: Payer: Self-pay | Admitting: Internal Medicine

## 2014-08-10 ENCOUNTER — Ambulatory Visit: Payer: Medicare Other | Admitting: Internal Medicine

## 2014-08-10 VITALS — BP 102/58 | HR 64 | Resp 18 | Wt 118.0 lb

## 2014-08-10 DIAGNOSIS — J449 Chronic obstructive pulmonary disease, unspecified: Secondary | ICD-10-CM

## 2014-08-10 DIAGNOSIS — R443 Hallucinations, unspecified: Secondary | ICD-10-CM

## 2014-08-10 DIAGNOSIS — I5032 Chronic diastolic (congestive) heart failure: Secondary | ICD-10-CM

## 2014-08-10 DIAGNOSIS — F0150 Vascular dementia without behavioral disturbance: Secondary | ICD-10-CM

## 2014-08-10 DIAGNOSIS — N400 Enlarged prostate without lower urinary tract symptoms: Secondary | ICD-10-CM

## 2014-08-10 DIAGNOSIS — M519 Unspecified thoracic, thoracolumbar and lumbosacral intervertebral disc disorder: Secondary | ICD-10-CM

## 2014-08-10 DIAGNOSIS — I4891 Unspecified atrial fibrillation: Secondary | ICD-10-CM

## 2014-08-10 DIAGNOSIS — I480 Paroxysmal atrial fibrillation: Secondary | ICD-10-CM

## 2014-08-10 NOTE — Assessment & Plan Note (Signed)
He complains that his pain is not controlled Discussed with daughter that this could be why is mood is worse Will carefully try increasing hydrocodone again--and watch for increased delirium

## 2014-08-10 NOTE — Assessment & Plan Note (Signed)
Still regular with amiodarone No anticoagulation due to falls and bleeding risk

## 2014-08-10 NOTE — Assessment & Plan Note (Signed)
Compensated Hasn't needed norco lately

## 2014-08-10 NOTE — Assessment & Plan Note (Signed)
Still occur as part of his spells of night delirium Not clearly from meds ---though maybe less frequent with lower norco dose

## 2014-08-10 NOTE — Progress Notes (Signed)
Subjective:    Patient ID: [name], male    DOB: 28-Dec-1927, 80 y.o.   MRN: [id number]  HPI Nicole--hospice RN is here. Also wife and daughter "Well doctor I'm just not doing any good"  Breathing is not good Does okay on the oxygen Some cough---had bad spell last night. But not persistent No fever  Having ongoing pain Legs and back Limits his getting around Can still walk some in the house with the walker Getting 1/2 norco at a time--he doesn't think this helps that much No periods of delirium since they are limiting hydrocodone to 3 or less full tabs  Did have first bad confused night in a while last night Awoke yelling for wife---having the hallucinations Delusions that he is somewhere else Has been getting lorazepam at night for the past week or so---this had been helping  Did have the brief problems voiding--had cath without significant retention Doing better with this now  No palpitations No chest pain--hasn't needed nitro No syncope  Still has aides twice a day 7 days per week Needs help with bathing, dressing and some with bathroom Still generally continent  Mood has been down Frustrated overall and has made some comments about dissatisfaction Hard to tell if overt depression Current Outpatient Prescriptions on File Prior to Visit  Medication Sig Dispense Refill  . acetaminophen (TYLENOL) 325 MG tablet Take 2 tablets (650 mg total) by mouth every 6 (six) hours as needed for mild pain, fever or headache.      . albuterol (2.5 MG/3ML) 0.083% NEBU 3 mL, albuterol (5 MG/ML) 0.5% NEBU 0.5 mL Inhale 5 mg into the lungs 2 (two) times daily. With budesonide and every 2 hours as needed      . amiodarone (PACERONE) 200 MG tablet Take 200 mg by mouth daily.      Marland Kitchen aspirin 81 MG chewable tablet Chew 1 tablet (81 mg total) by mouth daily.      . bisacodyl (DULCOLAX) 10 MG suppository Place 1 suppository (10 mg total) rectally daily as needed for moderate constipation.   12 suppository  0  . budesonide (PULMICORT) 0.25 MG/2ML nebulizer solution Take 2 mLs (0.25 mg total) by nebulization 2 (two) times daily.  60 mL  12  . feeding supplement, ENSURE, (ENSURE) PUDG Take 1 Container by mouth 3 (three) times daily between meals.    0  . fluticasone (FLONASE) 50 MCG/ACT nasal spray Place 2 sprays into the nose daily.      . furosemide (LASIX) 40 MG tablet Take 40 mg by mouth daily as needed.      Marland Kitchen guaiFENesin (ROBITUSSIN) 100 MG/5ML SOLN Take 5 mLs by mouth every 4 (four) hours as needed for cough or to loosen phlegm.      Marland Kitchen HYDROcodone-acetaminophen (NORCO) 10-325 MG per tablet Take 0.5-1 tablets by mouth 4 (four) times daily as needed for severe pain.  120 tablet  0  . levothyroxine (SYNTHROID, LEVOTHROID) 100 MCG tablet Take 1 tablet (100 mcg total) by mouth daily before breakfast.  30 tablet  0  . LORazepam (ATIVAN) 0.5 MG tablet Take 0.5-1 mg by mouth every 4 (four) hours as needed for anxiety.      . nitroGLYCERIN (NITROSTAT) 0.4 MG SL tablet Place 0.4 mg under the tongue every 5 (five) minutes as needed for chest pain. x3 doses for for chest pain      . polyethylene glycol (MIRALAX / GLYCOLAX) packet Take 17 g by mouth daily as needed for moderate  constipation.  14 each  0  . potassium chloride SA (K-DUR,KLOR-CON) 20 MEQ tablet Take 20 mEq by mouth daily as needed. If the furosemide is given      . RAPAFLO 8 MG CAPS Take 8 mg by mouth daily.       Marland Kitchen senna-docusate (SENOKOT-S) 8.6-50 MG per tablet Take 2 tablets by mouth at bedtime as needed for mild constipation.  60 tablet  11   No current facility-administered medications on file prior to visit.    Allergies  Allergen Reactions  . Fenofibrate Other (See Comments)    REACTION: "ran me up a wall"  . Penicillins Other (See Comments)    blisters  . Atorvastatin Other (See Comments)    REACTION: severe muscle aches  . Ezetimibe-Simvastatin Other (See Comments)    REACTION: severe muscle aches    Past  Medical History  Diagnosis Date  . CAD (coronary artery disease)   . Hyperlipidemia   . Kidney stones   . BPH (benign prostatic hypertrophy)   . Osteoarthritis   . COPD (chronic obstructive pulmonary disease)   . Hypothyroidism   . Atrial fibrillation   . Impaired fasting glucose   . Myocardial infarction   . Hypertension   . Shortness of breath   . CHF (congestive heart failure)   . GERD (gastroesophageal reflux disease)     Past Surgical History  Procedure Laterality Date  . Inguinal hernia repair  09/2004    left  . Back surgery  1990's    x3  . Transurethral resection of prostate  2002  . Pleural scarification  1980    right  . Esophagogastroduodenoscopy  04/2002    negative  . Bladder stone removal  06/2006  . Colonoscopy Left 04/13/2013    Procedure: COLONOSCOPY;  Surgeon: Willis Modena, MD;  [facility];  Service: Endoscopy;  Laterality: Left;    Family History  Problem Relation Age of Onset  . Heart disease Mother   . Heart disease Brother   . Stroke Brother   . Cancer Neg Hx   . Diabetes Neg Hx   . Hypertension Neg Hx   . Heart disease Brother   . Stroke Brother   . Heart disease Brother   . Stroke Brother     History   Social History  . Marital Status: Married    Spouse Name: N/A    Number of Children: 1  . Years of Education: N/A   Occupational History  . retired Therapist, music    Social History Main Topics  . Smoking status: Former Smoker -- 1.00 packs/day for 60 years    Types: Cigarettes    Quit date: 12/24/2011  . Smokeless tobacco: Never Used  . Alcohol Use: No  . Drug Use: No  . Sexual Activity: Not on file   Other Topics Concern  . Not on file   Social History Narrative   Has living will   No [place] health care POA. Asks for daughter Ardeen Fillers to make decisions if needed.   Has DNR--done while in hospital   Not sure about tube feeds   Review of Systems Appetite is good Weight is fairly stable Gets intermittent  nausea Still falling--but not as often. No major injuries fortunately    Objective:   Physical Exam  Constitutional: He appears well-developed. No distress.  Neck: Normal range of motion. Neck supple. No thyromegaly present.  Cardiovascular: Normal rate, regular rhythm and normal heart sounds.  Exam reveals no gallop.  No murmur heard. Pulmonary/Chest: Effort normal. No respiratory distress. He has no wheezes. He has no rales.  Decreased breath sounds but clear  Abdominal: Soft. There is no tenderness.  Musculoskeletal: He exhibits no edema.  Lymphadenopathy:    He has no cervical adenopathy.  Psychiatric: He has a normal mood and affect. His behavior is normal.          Assessment & Plan:

## 2014-08-10 NOTE — Assessment & Plan Note (Signed)
Slow decline Needs help with ADLs Still on hospice care

## 2014-08-10 NOTE — Assessment & Plan Note (Signed)
Voiding okay on rapaflo

## 2014-08-10 NOTE — Assessment & Plan Note (Signed)
Stable on his current meds

## 2014-08-11 ENCOUNTER — Ambulatory Visit: Payer: Self-pay | Admitting: Family Medicine

## 2014-08-11 DIAGNOSIS — Z5181 Encounter for therapeutic drug level monitoring: Secondary | ICD-10-CM

## 2014-08-26 ENCOUNTER — Telehealth: Payer: Self-pay

## 2014-08-26 NOTE — Telephone Encounter (Signed)
Pt is new to me.  Is this a new issue for her? Has she been receiving her lasix?  It sounds like if her edema is worsening, this could be heart failure but also sounds dry with difficulty voiding.  Not quite sure what else we can do besides cath at this point.  If RN who knows her has any suggestions, please let me know.

## 2014-08-26 NOTE — Telephone Encounter (Signed)
I would be very nervous about giving him too much lasix since he has low urine output.  Ok to give one dose of 40 mg with Kdur 20 meq now and then see how he does.  And if this is due to BPH, lasix will not be very helpful.

## 2014-08-26 NOTE — Telephone Encounter (Signed)
Spoke to [name] and advised instruction per Dr Dayton Martes. [name] verbally expressed understanding and states that she will ensure that someone is home with the pt so that he does not fall when attempting to go to the restroom

## 2014-08-26 NOTE — Telephone Encounter (Signed)
Spoke to [place] who states taht the pt has Hx of BPH. She states that per Dr Alphonsus Sias, pt is only taking Lasix on prn basis. [name] wants to know whether she should give him one today, and for new instruction if so. She states that she was able to cath him but was only able to obtain <100cc.

## 2014-08-26 NOTE — Telephone Encounter (Signed)
[name] with [facility] left v/m; pt was having difficulty voiding, dribbling a lot. [name] did in and out cath with return of 100 cc of urine. Pt felt better after catheterization; pt having edema in lower extremities 2 +left leg and 3 + in rt leg . No SOB and lungs are clear. Pt is resting now. [name] request cb with further instructions.

## 2014-09-07 ENCOUNTER — Other Ambulatory Visit: Payer: Self-pay

## 2014-09-07 MED ORDER — HYDROCODONE-ACETAMINOPHEN 10-325 MG PO TABS: 0.5000 | ORAL_TABLET | Freq: Four times a day (QID) | ORAL | Status: DC | PRN

## 2014-09-07 NOTE — Telephone Encounter (Signed)
Okay to fax

## 2014-09-07 NOTE — Telephone Encounter (Signed)
[facility] left v/m requesting refill of Norco to [facility] [address]Please advise.

## 2014-09-07 NOTE — Telephone Encounter (Signed)
rx faxed to pharmacy manually  

## 2014-09-13 ENCOUNTER — Telehealth: Payer: Self-pay | Admitting: Internal Medicine

## 2014-09-13 NOTE — Telephone Encounter (Signed)
PC from [place], hospice RN Has had severe pain in right temple around to occiput for a while Mostly in AM No vision loss, jaw claudication or vision changes Heat and norco no clear help  Will try checking ESR. If normal, try 1/2 lidoderm patch for 12 hours prn

## 2014-09-14 ENCOUNTER — Encounter: Payer: Self-pay | Admitting: Internal Medicine

## 2014-09-14 ENCOUNTER — Telehealth: Payer: Self-pay | Admitting: Internal Medicine

## 2014-09-14 NOTE — Telephone Encounter (Signed)
Phone call from [place]  Actually the headache is gone--- sed rate not that remarkable then  [place] again Unable to move left leg Has some pain but not out of position and she can do some passive ROM ---doesn't seem broken ?CVA  Will observe for now Try pain meds If not improving--may need x-ray (here if possible, or ER if needs stretcher to leave home)

## 2014-09-15 ENCOUNTER — Telehealth: Payer: Self-pay

## 2014-09-15 ENCOUNTER — Ambulatory Visit (INDEPENDENT_AMBULATORY_CARE_PROVIDER_SITE_OTHER)
Admission: RE | Admit: 2014-09-15 | Discharge: 2014-09-15 | Disposition: A | Discharge status: Home / Self Care | Payer: Medicare Other | Source: Ambulatory Visit | Attending: Internal Medicine | Admitting: Internal Medicine

## 2014-09-15 ENCOUNTER — Encounter: Payer: Self-pay | Admitting: Internal Medicine

## 2014-09-15 ENCOUNTER — Ambulatory Visit (INDEPENDENT_AMBULATORY_CARE_PROVIDER_SITE_OTHER): Admitting: Internal Medicine

## 2014-09-15 DIAGNOSIS — S72033A Displaced midcervical fracture of unspecified femur, initial encounter for closed fracture: Secondary | ICD-10-CM

## 2014-09-15 DIAGNOSIS — M25552 Pain in left hip: Secondary | ICD-10-CM

## 2014-09-15 DIAGNOSIS — M25559 Pain in unspecified hip: Secondary | ICD-10-CM

## 2014-09-15 DIAGNOSIS — S72012A Unspecified intracapsular fracture of left femur, initial encounter for closed fracture: Secondary | ICD-10-CM | POA: Insufficient documentation

## 2014-09-15 DIAGNOSIS — I2510 Atherosclerotic heart disease of native coronary artery without angina pectoris: Secondary | ICD-10-CM

## 2014-09-15 NOTE — Telephone Encounter (Signed)
See OV note.  

## 2014-09-15 NOTE — Telephone Encounter (Signed)
[facility] called report on lt hip xray;  Report is in pts chart; Dr Alphonsus Sias in a room and [facility] said Dr Alphonsus Sias already aware of report and pt is in office on stretcher.

## 2014-09-15 NOTE — Progress Notes (Signed)
Subjective:    Patient ID: [name], male    DOB: [birth year], 80 y.o.   MRN: [id number] Brought in by ambulance on stretcher See phone note about call from yesterday  Still having severe pain Not able to bear weight Hasn't left his recliner  Wife and daughter giving him more hydrocodone--seems to help some Really has pain with any movement of his left leg  Current Outpatient Prescriptions on File Prior to Visit  Medication Sig Dispense Refill  . acetaminophen (TYLENOL) 325 MG tablet Take 2 tablets (650 mg total) by mouth every 6 (six) hours as needed for mild pain, fever or headache.      . albuterol (2.5 MG/3ML) 0.083% NEBU 3 mL, albuterol (5 MG/ML) 0.5% NEBU 0.5 mL Inhale 5 mg into the lungs 2 (two) times daily. With budesonide and every 2 hours as needed      . amiodarone (PACERONE) 200 MG tablet Take 200 mg by mouth daily.      Marland Kitchen aspirin 81 MG chewable tablet Chew 1 tablet (81 mg total) by mouth daily.      . bisacodyl (DULCOLAX) 10 MG suppository Place 1 suppository (10 mg total) rectally daily as needed for moderate constipation.  12 suppository  0  . budesonide (PULMICORT) 0.25 MG/2ML nebulizer solution Take 2 mLs (0.25 mg total) by nebulization 2 (two) times daily.  60 mL  12  . furosemide (LASIX) 40 MG tablet Take 40 mg by mouth daily as needed.      Marland Kitchen guaiFENesin (ROBITUSSIN) 100 MG/5ML SOLN Take 5 mLs by mouth every 4 (four) hours as needed for cough or to loosen phlegm.      Marland Kitchen HYDROcodone-acetaminophen (NORCO) 10-325 MG per tablet Take 0.5-1 tablets by mouth 4 (four) times daily as needed for severe pain.  120 tablet  0  . levothyroxine (SYNTHROID, LEVOTHROID) 100 MCG tablet Take 1 tablet (100 mcg total) by mouth daily before breakfast.  30 tablet  0  . LORazepam (ATIVAN) 0.5 MG tablet Take 0.5-1 mg by mouth every 4 (four) hours as needed for anxiety.      . nitroGLYCERIN (NITROSTAT) 0.4 MG SL tablet Place 0.4 mg under the tongue every 5 (five) minutes as needed  for chest pain. x3 doses for for chest pain      . polyethylene glycol (MIRALAX / GLYCOLAX) packet Take 17 g by mouth daily as needed for moderate constipation.  14 each  0  . potassium chloride SA (K-DUR,KLOR-CON) 20 MEQ tablet Take 20 mEq by mouth daily as needed. If the furosemide is given      . RAPAFLO 8 MG CAPS Take 8 mg by mouth daily.       Marland Kitchen senna-docusate (SENOKOT-S) 8.6-50 MG per tablet Take 2 tablets by mouth at bedtime as needed for mild constipation.  60 tablet  11   No current facility-administered medications on file prior to visit.    Allergies  Allergen Reactions  . Fenofibrate Other (See Comments)    REACTION: "ran me up a wall"  . Penicillins Other (See Comments)    blisters  . Atorvastatin Other (See Comments)    REACTION: severe muscle aches  . Ezetimibe-Simvastatin Other (See Comments)    REACTION: severe muscle aches    Past Medical History  Diagnosis Date  . CAD (coronary artery disease)   . Hyperlipidemia   . Kidney stones   . BPH (benign prostatic hypertrophy)   . Osteoarthritis   . COPD (chronic obstructive pulmonary  disease)   . Hypothyroidism   . Atrial fibrillation   . Impaired fasting glucose   . Myocardial infarction   . Hypertension   . Shortness of breath   . CHF (congestive heart failure)   . GERD (gastroesophageal reflux disease)     Past Surgical History  Procedure Laterality Date  . Inguinal hernia repair  09/2004    left  . Back surgery  1990's    x3  . Transurethral resection of prostate  2002  . Pleural scarification  1980    right  . Esophagogastroduodenoscopy  04/2002    negative  . Bladder stone removal  06/2006  . Colonoscopy Left 04/13/2013    Procedure: COLONOSCOPY;  Surgeon: Willis Modena, MD;  Location: [facility] ENDOSCOPY;  Service: Endoscopy;  Laterality: Left;    Family History  Problem Relation Age of Onset  . Heart disease Mother   . Heart disease Brother   . Stroke Brother   . Cancer Neg Hx   . Diabetes Neg  Hx   . Hypertension Neg Hx   . Heart disease Brother   . Stroke Brother   . Heart disease Brother   . Stroke Brother     History   Social History  . Marital Status: Married    Spouse Name: N/A    Number of Children: 1  . Years of Education: N/A   Occupational History  . retired Therapist, music    Social History Main Topics  . Smoking status: Former Smoker -- 1.00 packs/day for 60 years    Types: Cigarettes    Quit date: 12/24/2011  . Smokeless tobacco: Never Used  . Alcohol Use: No  . Drug Use: No  . Sexual Activity: Not on file   Other Topics Concern  . Not on file   Social History Narrative   Has living will   No [place] health care POA. Asks for daughter Ardeen Fillers to make decisions if needed.   Has DNR--done while in hospital   Not sure about tube feeds      Review of Systems     Objective:   Physical Exam  Constitutional:  On stretcher Only engages briefly with poor concentration Comfortable if not moved  Musculoskeletal:  Left foot and knee are not tender Neither is shaft of tibia and femur  No left hip tenderness but has severe pain with any movement of the hip Left hip is in normal position though          Assessment & Plan:

## 2014-09-15 NOTE — Assessment & Plan Note (Signed)
Tried to discuss the options with him but limited understanding Wife and daughter are here They note that he has begged them never to have him go back to the hospital or rehab I discussed that without surgery his pain should eventually ease up and go away--but I don't expect he could ever walk again He should be able to bear weight though (like for transfers) Also stated that if they cannot control his pain at home--he could still go to hospital later (but that we hated to do what he asked [place] not to if it can be avoided)  Also discussed this via phone with hospice nurse [name] ~15 minutes of case management decision in 30 minute visit Will have him go home via the rescue  Back to his recline with no weight bearing for weeks Stay in recliner for the next 2-3 days, and if pain is better, can try to transfer to wheelchair with weight only on right leg (if possible) Increase hydrocodone to 1-2 q4 hour prn for now [name] will monitor closely

## 2014-09-19 ENCOUNTER — Telehealth: Payer: Self-pay | Admitting: Internal Medicine

## 2014-09-19 MED ORDER — HYDROCODONE-ACETAMINOPHEN 10-325 MG PO TABS: 1.0000 | ORAL_TABLET | ORAL | Status: DC | PRN

## 2014-09-19 NOTE — Telephone Encounter (Signed)
Hydrocodone apap faxed to [facility] fax # [phone].

## 2014-09-19 NOTE — Telephone Encounter (Signed)
Pts daughter called in and said that during the last visit Dr Alphonsus Sias advised for pt to take more Hydrocodone as needed and she would like a new rx faxed to hospice.

## 2014-09-19 NOTE — Telephone Encounter (Signed)
Please fax to pharmacy

## 2014-09-20 NOTE — Telephone Encounter (Signed)
[facility] and they did receive fax for hydrocodone.

## 2014-09-21 ENCOUNTER — Encounter: Payer: Self-pay | Admitting: Internal Medicine

## 2014-09-21 ENCOUNTER — Ambulatory Visit: Admitting: Internal Medicine

## 2014-09-21 ENCOUNTER — Telehealth: Payer: Self-pay | Admitting: Internal Medicine

## 2014-09-21 ENCOUNTER — Other Ambulatory Visit: Payer: Self-pay

## 2014-09-21 VITALS — BP 122/60 | HR 60 | Resp 18

## 2014-09-21 DIAGNOSIS — F0150 Vascular dementia without behavioral disturbance: Secondary | ICD-10-CM

## 2014-09-21 DIAGNOSIS — Z23 Encounter for immunization: Secondary | ICD-10-CM

## 2014-09-21 DIAGNOSIS — I5032 Chronic diastolic (congestive) heart failure: Secondary | ICD-10-CM

## 2014-09-21 DIAGNOSIS — S72009D Fracture of unspecified part of neck of unspecified femur, subsequent encounter for closed fracture with routine healing: Secondary | ICD-10-CM

## 2014-09-21 DIAGNOSIS — S72012D Unspecified intracapsular fracture of left femur, subsequent encounter for closed fracture with routine healing: Secondary | ICD-10-CM

## 2014-09-21 DIAGNOSIS — J449 Chronic obstructive pulmonary disease, unspecified: Secondary | ICD-10-CM

## 2014-09-21 DIAGNOSIS — I4891 Unspecified atrial fibrillation: Secondary | ICD-10-CM

## 2014-09-21 DIAGNOSIS — R443 Hallucinations, unspecified: Secondary | ICD-10-CM

## 2014-09-21 DIAGNOSIS — E43 Unspecified severe protein-calorie malnutrition: Secondary | ICD-10-CM

## 2014-09-21 DIAGNOSIS — I2510 Atherosclerotic heart disease of native coronary artery without angina pectoris: Secondary | ICD-10-CM

## 2014-09-21 MED ORDER — HYDROCODONE-ACETAMINOPHEN 10-325 MG PO TABS: 1.0000 | ORAL_TABLET | ORAL | Status: AC | PRN

## 2014-09-21 NOTE — Assessment & Plan Note (Signed)
More confused since in bed all the time Has needed lorazepam to keep him from trying to get to the bathroom, etc Total care On hospice

## 2014-09-21 NOTE — Telephone Encounter (Signed)
Please have one of the other doctors rewrite the script and fax it to the pharmacy. It should never have been faxed to hospice ([facility]

## 2014-09-21 NOTE — Telephone Encounter (Signed)
Dr. Para Marchuncan, Could you please rewrite the script for Norco and have Sydell Axonegina Laws fax it to the pharmacy? Thanks!

## 2014-09-21 NOTE — Telephone Encounter (Signed)
This has reportedly been handled.

## 2014-09-21 NOTE — Assessment & Plan Note (Signed)
Clearly improved Less pain and not needing as much pain meds No signs of DVT May be able to try transferring on good right leg in next 1-2 weeks depending on how he does

## 2014-09-21 NOTE — Progress Notes (Signed)
Subjective:    Patient ID: [name], male    DOB: [birth year], 80 y.o.   MRN: [id number]  HPI Wife and daughter here Caregiver Tammy also here  [name] RN from hospice also  Has settled in some Now in hospital bed in living room At first, lots of pain and needed the norco every 3 hours Pain is less evident now---down to every 6-8 hours Some agitation---thinks he has to get up to use the bathroom. They have had to give him the lorazepam to keep him from trying to get out of bed Easier to change diaper now Moving the left leg a little now  Some alert times--but still more confused than his usual At times,  Not eating well and usually refusing his supplements also  Breathing seems to be okay Using the oxygen all the time Hasn't been getting the nebulizer treatments--will change them to prn  Has had some vague chest pain per wife No apparent palpitations No dyspnea noted  They now have caregivers all the time May try to cut back in the next few days if he stays stable  Current Outpatient Prescriptions on File Prior to Visit  Medication Sig Dispense Refill  . acetaminophen (TYLENOL) 325 MG tablet Take 2 tablets (650 mg total) by mouth every 6 (six) hours as needed for mild pain, fever or headache.      . albuterol (2.5 MG/3ML) 0.083% NEBU 3 mL, albuterol (5 MG/ML) 0.5% NEBU 0.5 mL Inhale 5 mg into the lungs 2 (two) times daily. With budesonide and every 2 hours as needed      . amiodarone (PACERONE) 200 MG tablet Take 200 mg by mouth daily.      Marland Kitchen aspirin 81 MG chewable tablet Chew 1 tablet (81 mg total) by mouth daily.      . bisacodyl (DULCOLAX) 10 MG suppository Place 1 suppository (10 mg total) rectally daily as needed for moderate constipation.  12 suppository  0  . budesonide (PULMICORT) 0.25 MG/2ML nebulizer solution Take 2 mLs (0.25 mg total) by nebulization 2 (two) times daily.  60 mL  12  . furosemide (LASIX) 40 MG tablet Take 40 mg by mouth daily as needed.      Marland Kitchen  guaiFENesin (ROBITUSSIN) 100 MG/5ML SOLN Take 5 mLs by mouth every 4 (four) hours as needed for cough or to loosen phlegm.      Marland Kitchen HYDROcodone-acetaminophen (NORCO) 10-325 MG per tablet Take 1-2 tablets by mouth every 3 (three) hours as needed for severe pain.  90 tablet  0  . levothyroxine (SYNTHROID, LEVOTHROID) 100 MCG tablet Take 1 tablet (100 mcg total) by mouth daily before breakfast.  30 tablet  0  . LORazepam (ATIVAN) 0.5 MG tablet Take 0.5-1 mg by mouth every 4 (four) hours as needed for anxiety.      . nitroGLYCERIN (NITROSTAT) 0.4 MG SL tablet Place 0.4 mg under the tongue every 5 (five) minutes as needed for chest pain. x3 doses for for chest pain      . polyethylene glycol (MIRALAX / GLYCOLAX) packet Take 17 g by mouth daily as needed for moderate constipation.  14 each  0  . potassium chloride SA (K-DUR,KLOR-CON) 20 MEQ tablet Take 20 mEq by mouth daily as needed. If the furosemide is given      . RAPAFLO 8 MG CAPS Take 8 mg by mouth daily.       Marland Kitchen senna-docusate (SENOKOT-S) 8.6-50 MG per tablet Take 2 tablets by mouth at bedtime  as needed for mild constipation.  60 tablet  11   No current facility-administered medications on file prior to visit.    Allergies  Allergen Reactions  . Fenofibrate Other (See Comments)    REACTION: "ran me up a wall"  . Penicillins Other (See Comments)    blisters  . Atorvastatin Other (See Comments)    REACTION: severe muscle aches  . Ezetimibe-Simvastatin Other (See Comments)    REACTION: severe muscle aches    Past Medical History  Diagnosis Date  . CAD (coronary artery disease)   . Hyperlipidemia   . Kidney stones   . BPH (benign prostatic hypertrophy)   . Osteoarthritis   . COPD (chronic obstructive pulmonary disease)   . Hypothyroidism   . Atrial fibrillation   . Impaired fasting glucose   . Myocardial infarction   . Hypertension   . Shortness of breath   . CHF (congestive heart failure)   . GERD (gastroesophageal reflux disease)      Past Surgical History  Procedure Laterality Date  . Inguinal hernia repair  09/2004    left  . Back surgery  1990's    x3  . Transurethral resection of prostate  2002  . Pleural scarification  1980    right  . Esophagogastroduodenoscopy  04/2002    negative  . Bladder stone removal  06/2006  . Colonoscopy Left 04/13/2013    Procedure: COLONOSCOPY;  Surgeon: Willis ModenaWilliam Outlaw, MD;  [facility];  Service: Endoscopy;  Laterality: Left;    Family History  Problem Relation Age of Onset  . Heart disease Mother   . Heart disease Brother   . Stroke Brother   . Cancer Neg Hx   . Diabetes Neg Hx   . Hypertension Neg Hx   . Heart disease Brother   . Stroke Brother   . Heart disease Brother   . Stroke Brother     History   Social History  . Marital Status: Married    Spouse Name: N/A    Number of Children: 1  . Years of Education: N/A   Occupational History  . retired Therapist, [employer]    Social History Main Topics  . Smoking status: Former Smoker -- 1.00 packs/day for 60 years    Types: Cigarettes    Quit date: 12/24/2011  . Smokeless tobacco: Never Used  . Alcohol Use: No  . Drug Use: No  . Sexual Activity: Not on file   Other Topics Concern  . Not on file   Social History Narrative   Has living will   No [place] health care POA. Asks for daughter Ardeen FillersLisa Bray to make decisions if needed.   Has DNR--done while in hospital   Not sure about tube feeds   Review of Systems Sleeping a lot--variable at night No BM in past week---large that time but none since. Will try suppository today No leg swelling No skin breakdown    Objective:   Physical Exam  Constitutional: No distress.  Somnolent but responds briefly  Neck: Normal range of motion. Neck supple. No JVD present. No thyromegaly present.  Cardiovascular: Normal rate and normal heart sounds.  Exam reveals no gallop.   No murmur heard. Irregular today  Pulmonary/Chest: He has no wheezes. He has no rales.    Decreased breath sounds but clear  Abdominal: Soft. There is no tenderness.  Musculoskeletal:  Left hip is slightly flexed--no external rotation. No left hip tenderness Resists passive flexion now  Lymphadenopathy:    He  has no cervical adenopathy.  Neurological:  Mostly sleeping Does respond appropriately once I waken him  Psychiatric:  Mood seems neutral          Assessment & Plan:

## 2014-09-21 NOTE — Telephone Encounter (Signed)
Toniann FailWendy from hospice called stating that the rx for Norco that was sent to hospice cannot be re-faxed to pt pharmacy. [facility] needs to [facility] directly [facility] [address] Please advise  [facility] # [phone]

## 2014-09-21 NOTE — Assessment & Plan Note (Signed)
Confused and some possible delirium--but no recent hallucinations

## 2014-09-21 NOTE — Telephone Encounter (Signed)
Prescription reprinted and signed by Dr. Dayton MartesAron.  Faxed to [facility] [address] [phone].

## 2014-09-21 NOTE — Assessment & Plan Note (Signed)
No apparent fluid issues now

## 2014-09-21 NOTE — Assessment & Plan Note (Signed)
Seems irregular again Rate okay Only asa due to bleeding risks and overall prognosis

## 2014-09-21 NOTE — Assessment & Plan Note (Signed)
Worse now since not eating since the fracture Discussed some alternatives with daughter and wife

## 2014-09-21 NOTE — Assessment & Plan Note (Signed)
Breathing seems okay Will change nebs to prn Flu vaccine and prevnar given today

## 2014-09-26 ENCOUNTER — Telehealth: Payer: Self-pay | Admitting: Internal Medicine

## 2014-09-26 NOTE — Telephone Encounter (Signed)
[place] call from [place] this AM Larey SeatFell last night around 4AM---needed EMS to get him back into bed  Guarding left leg now but reasonably comfortable in bed again. Doesn't want "to do anything" right now Will just continue analgesics and see if he settles back down again Still want to care for him at home

## 2014-09-29 ENCOUNTER — Telehealth: Payer: Self-pay | Admitting: Internal Medicine

## 2014-09-29 NOTE — Telephone Encounter (Signed)
[facility] yesterday Was more confused and agitated She found urinary retention and put in catheter. Urine sent for U/A and culture. Couldn't leave catheter in because he would pull it out She checked for stool impaction and found it. He wouldn't let her disimpact him but did have good BM after enema  Spoke to wife today He is about the same Encouraged her to give him at least some miralax every day to try to keep him going regularly (better loose than bound up)

## 2014-10-03 ENCOUNTER — Telehealth: Payer: Self-pay | Admitting: Internal Medicine

## 2014-10-06 NOTE — Telephone Encounter (Signed)
Spoke to wife and daughter and offered my condolences 

## 2014-10-10 ENCOUNTER — Encounter: Payer: Self-pay | Admitting: Internal Medicine

## 2014-10-11 ENCOUNTER — Encounter: Payer: Self-pay | Admitting: Internal Medicine

## 2014-10-23 NOTE — Telephone Encounter (Signed)
Pt's daughter calling to inform you that the pt, [name], passed away this morning.

## 2014-10-23 DEATH — deceased

## 2015-10-01 IMAGING — CR DG CHEST 1V PORT
2 series · 2 of 2 positions shown · non-contrast
Comparison: 07/30/2013

CLINICAL DATA: Shortness of breath, chronic cough, congestion

EXAM:
PORTABLE CHEST - 1 VIEW

[AP (1 of 2)]
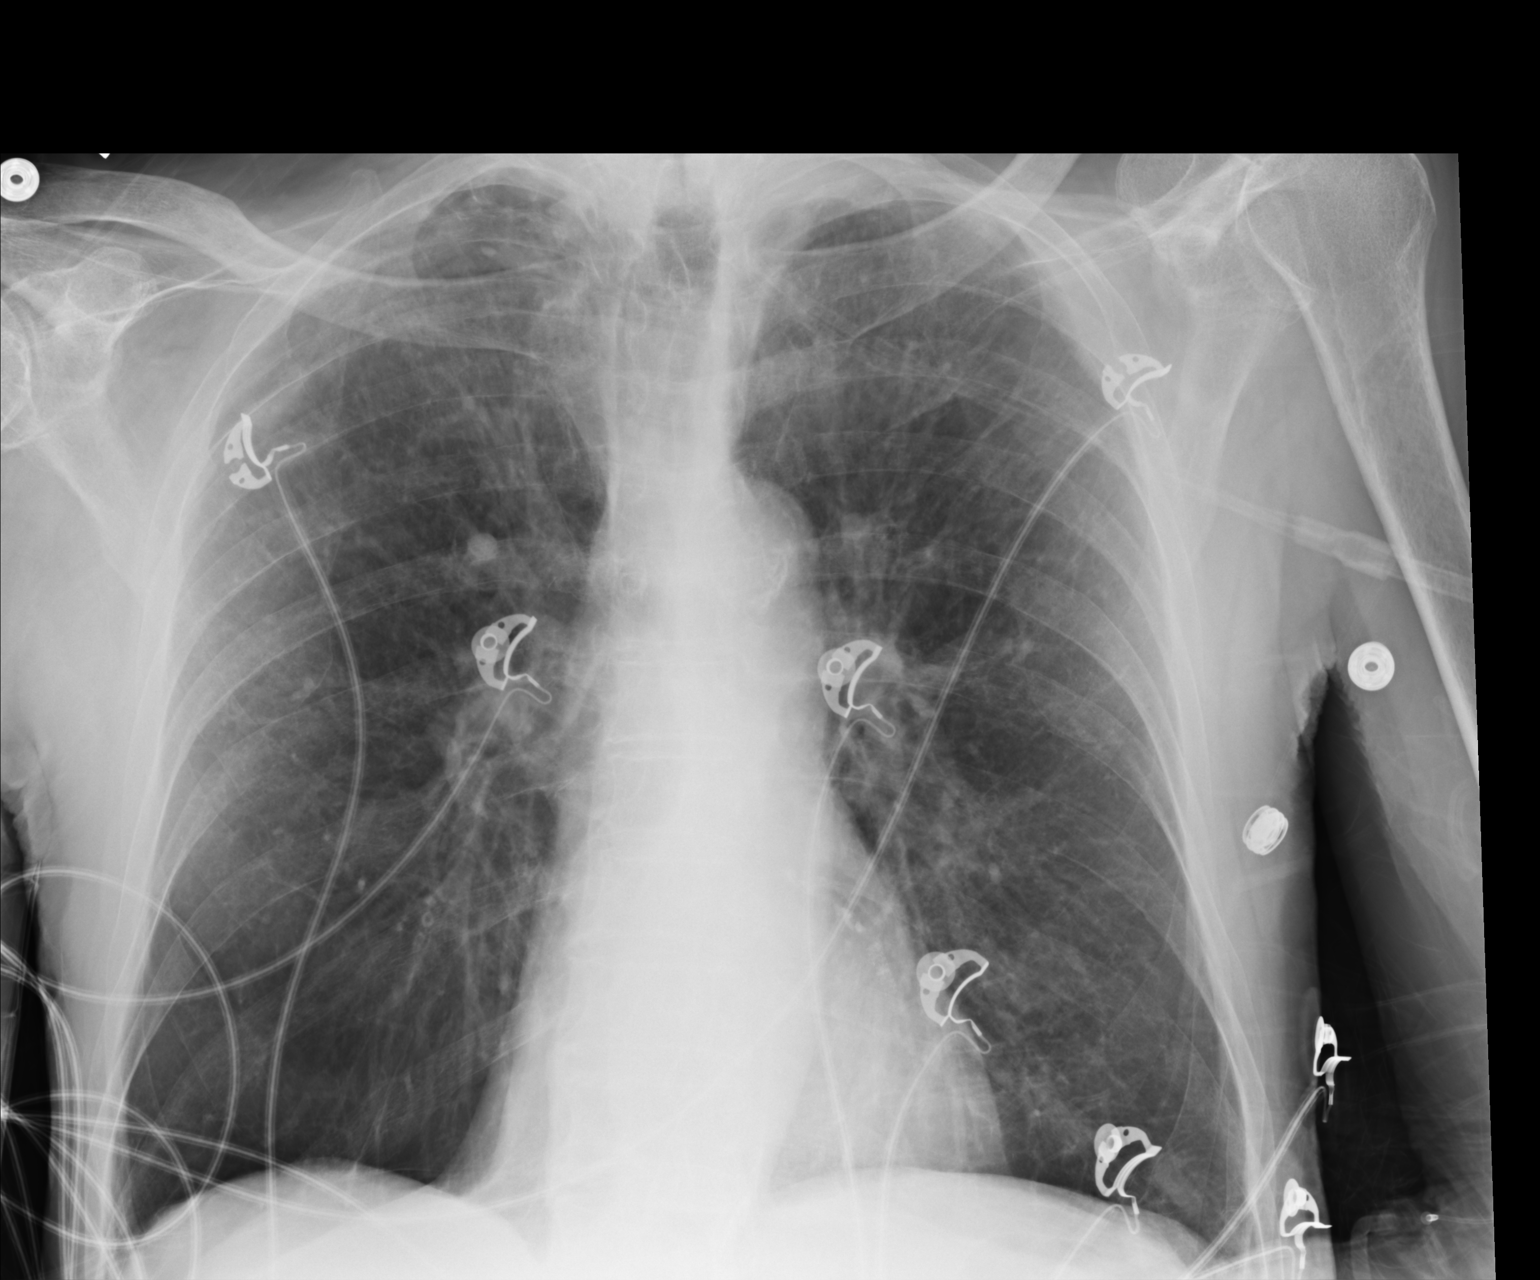

[AP (2 of 2)]
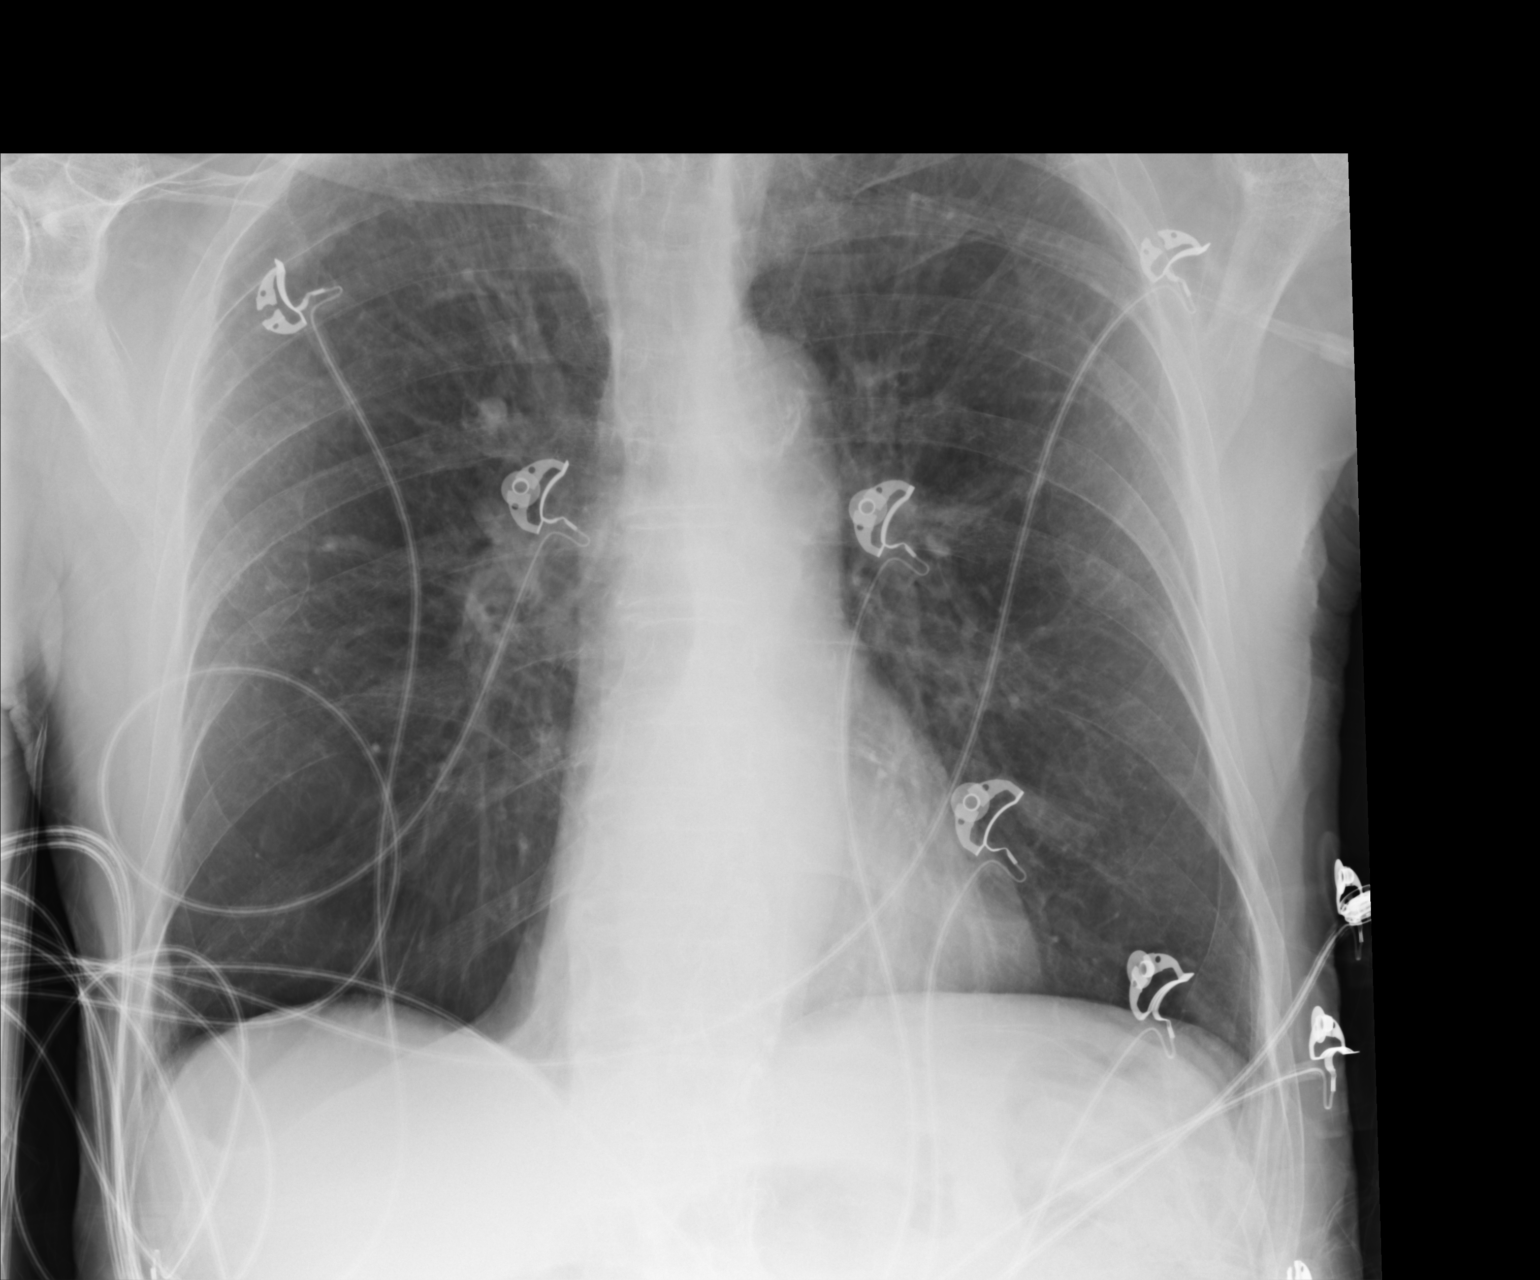

[2 of 2 positions shown; findings below may reference images not displayed]

FINDINGS: Chronic interstitial markings/ emphysematous changes. Biapical
pleural parenchymal scarring. No focal consolidation. No pleural
effusion or pneumothorax.

Heart is normal in size.
IMPRESSION: No evidence of acute cardiopulmonary disease.

Chronic interstitial markings/emphysematous changes with biapical
pleural parenchymal scarring.

## 2015-10-14 IMAGING — CT CT HEAD W/O CM
1 series · 16 of 30 positions shown, 20 images · non-contrast
Comparison: None.

CLINICAL DATA: Altered level of consciousness

EXAM:
CT HEAD WITHOUT CONTRAST
TECHNIQUE: Contiguous axial images were obtained from the base of the skull
through the vertex without intravenous contrast.

[Series 2: head 5.0 h30s · axial · 0.46mm/px · z∈[-189,-14]mm · 16 of 39 slices shown, 20 images]
[im 2/39  brain]
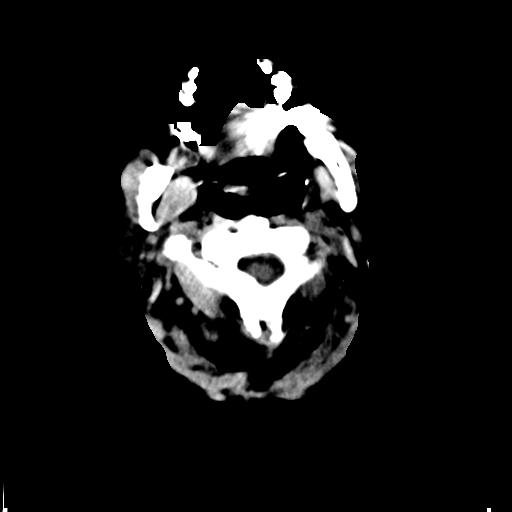
[im 2/39  bone]
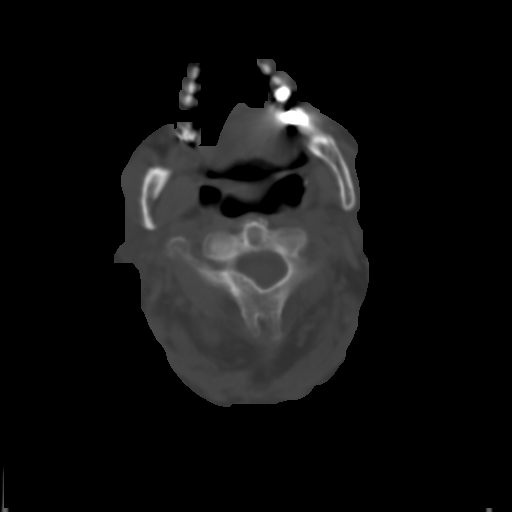
[im 4/39  brain]
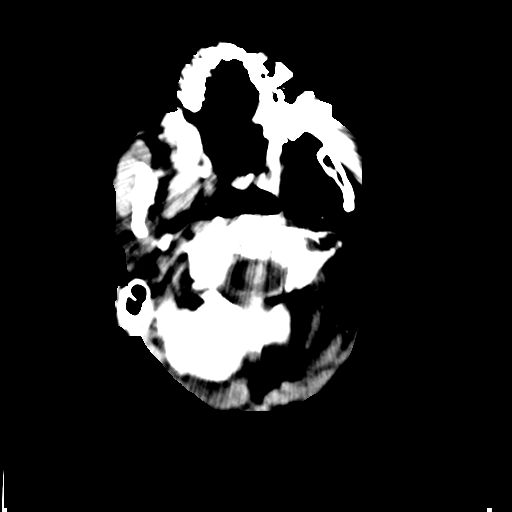
[im 7/39  brain]
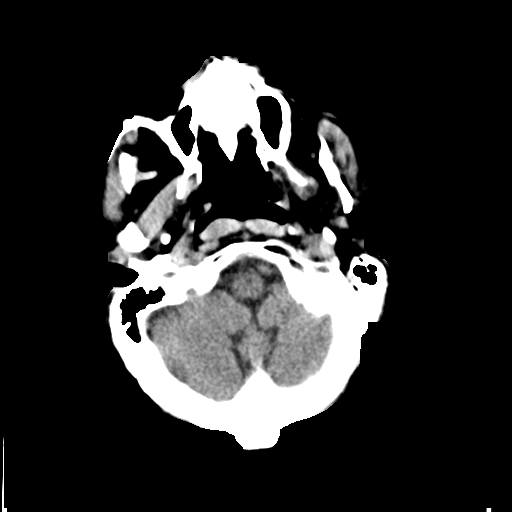
[im 10/39  brain]
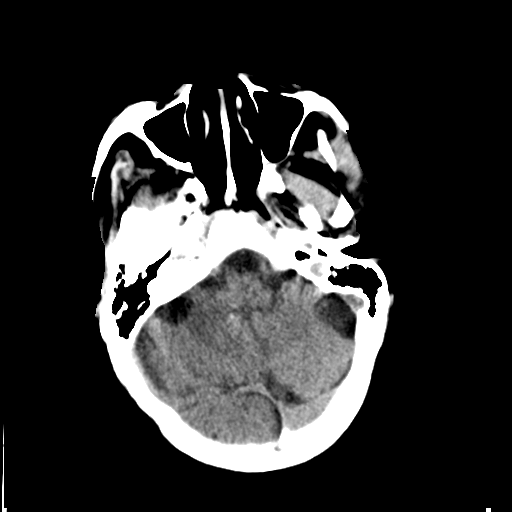
[im 11/39  brain]
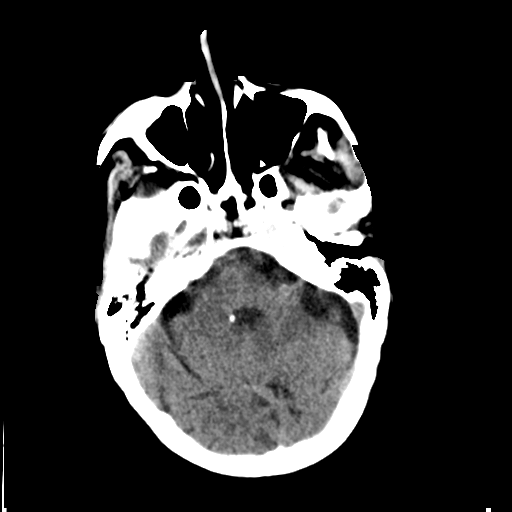
[im 11/39  bone]
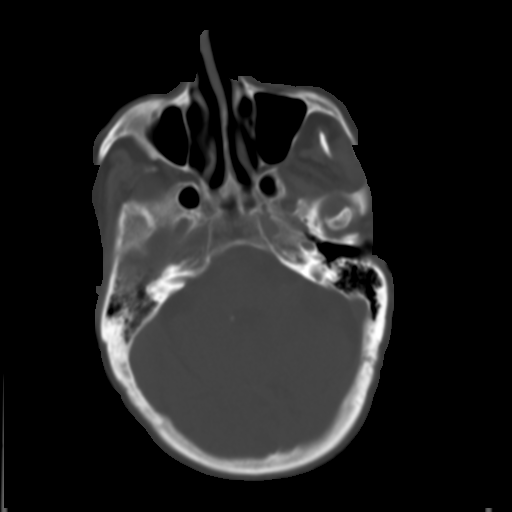
[im 14/39  brain]
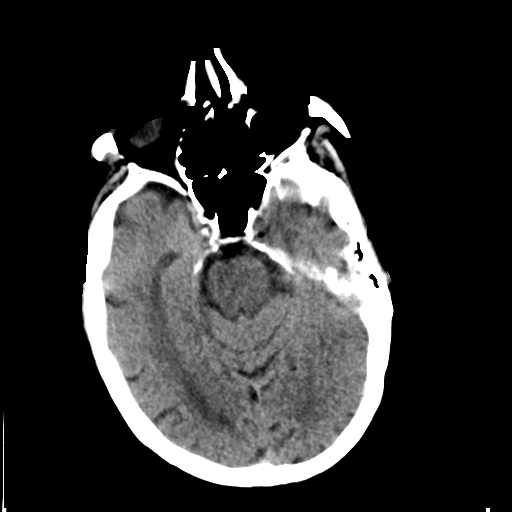
[im 16/39  brain]
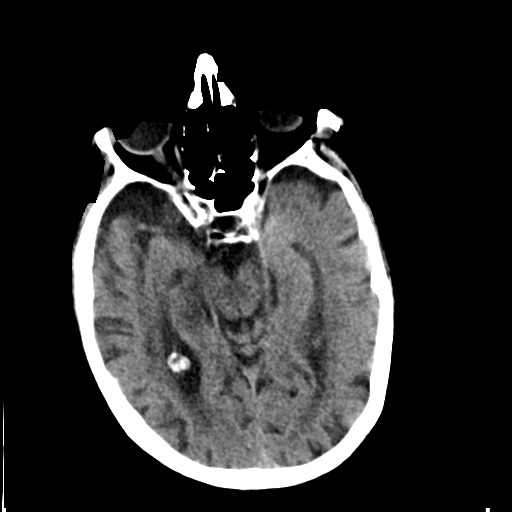
[im 19/39  brain]
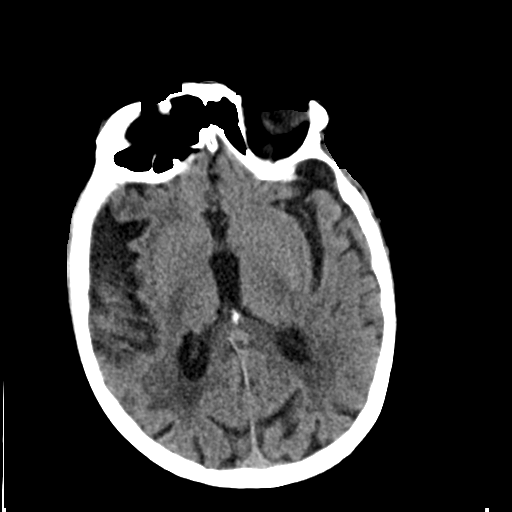
[im 20/39  brain]
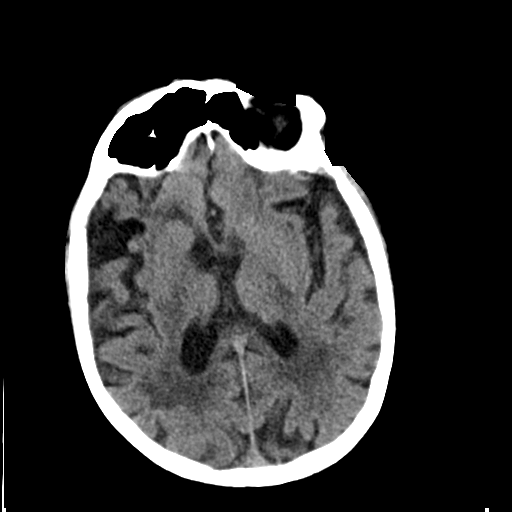
[im 20/39  bone]
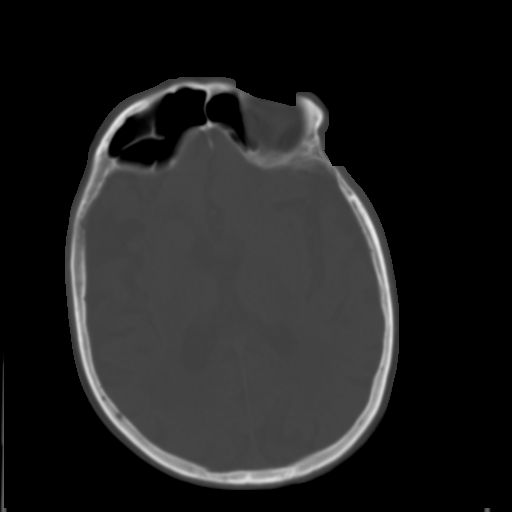
[im 23/39  brain]
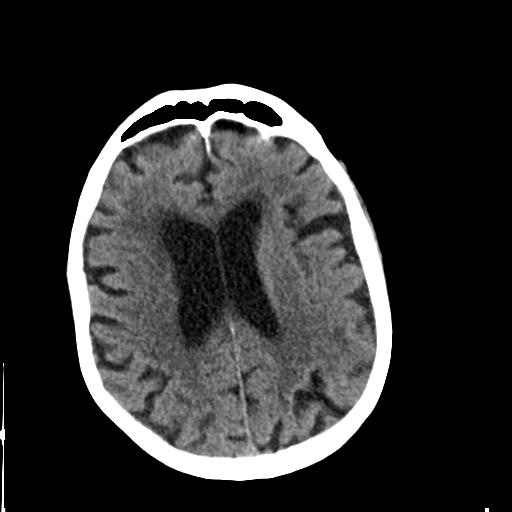
[im 25/39  brain]
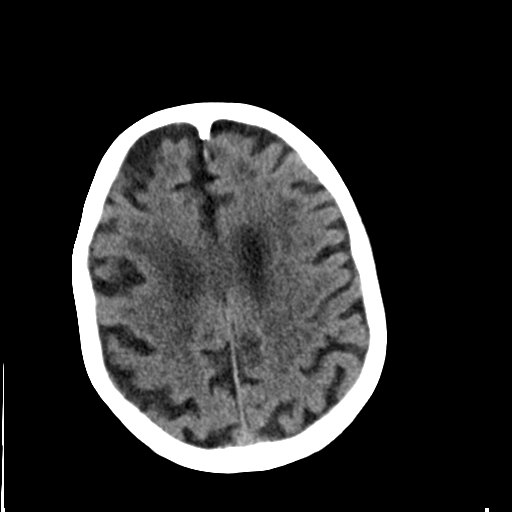
[im 28/39  brain]
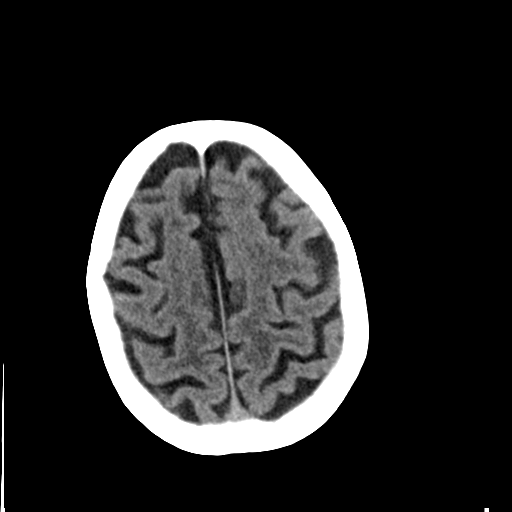
[im 29/39  brain]
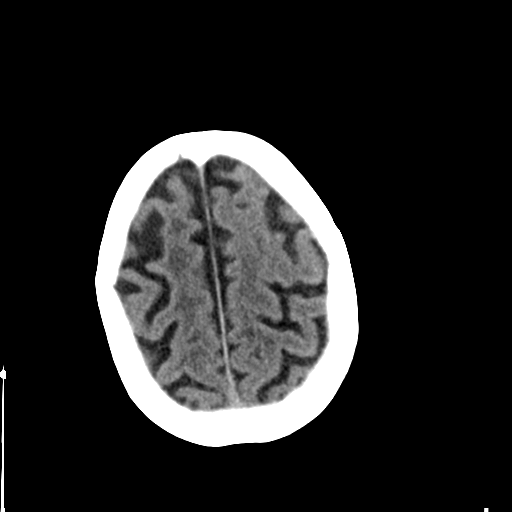
[im 29/39  bone]
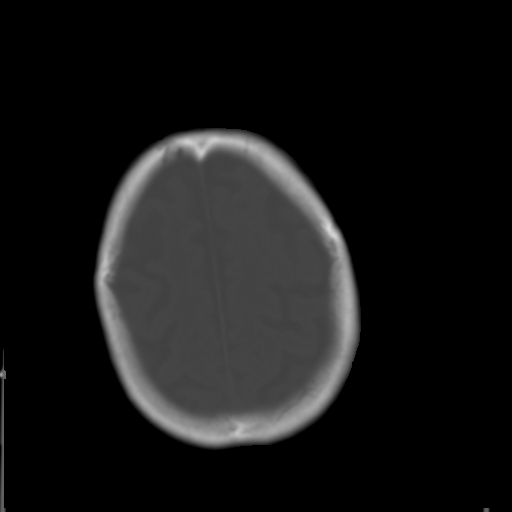
[im 32/39  brain]
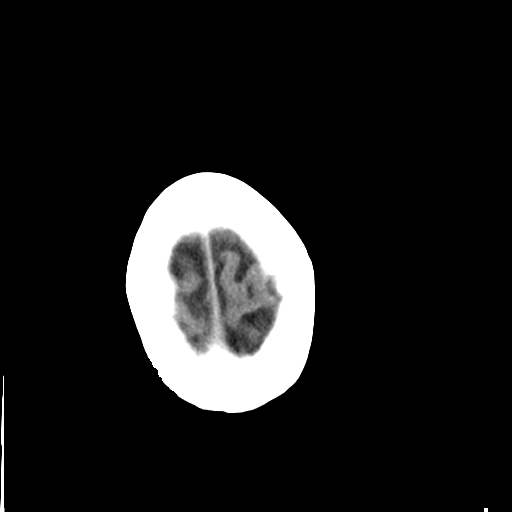
[im 35/39  brain]
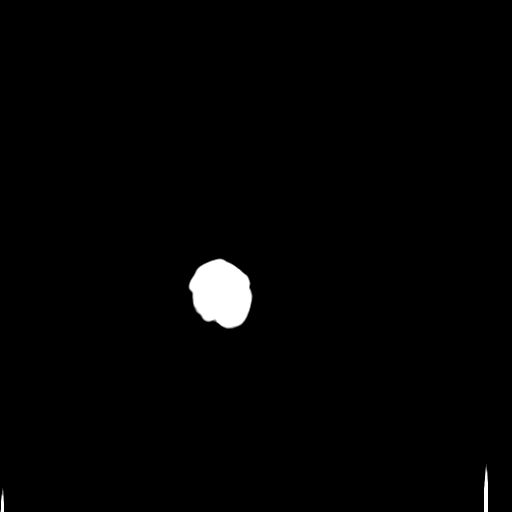
[im 37/39  brain]
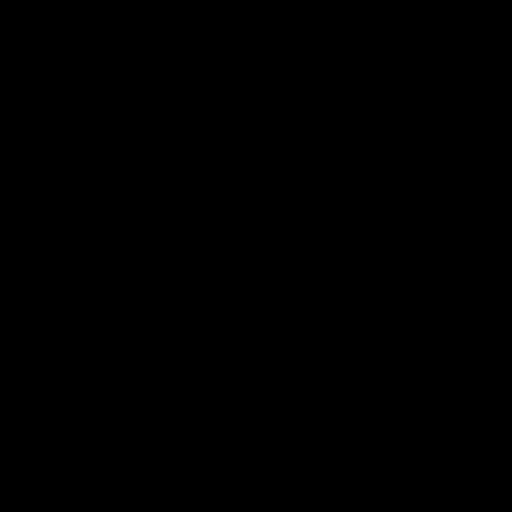

[16 of 30 positions shown; findings below may reference images not displayed]

FINDINGS: Prominence of the sulci, cisterns, and ventricles, in keeping with
volume loss. Periventricular and subcortical white matter
hypodensities are advanced and most in keeping with chronic
microangiopathic change. No overt hydrocephalus. No definite CT
evidence of an acute infarction. No intraparenchymal hemorrhage,
mass, mass effect, or abnormal extra-axial fluid collection. The
visualized paranasal sinuses and mastoid air cells are predominantly
clear.
IMPRESSION: Volume loss and white matter changes as above. No definite CT
evidence of acute intracranial abnormality.
